# Patient Record
Sex: Female | Born: 1935 | Race: White | Hispanic: No | Smoking: Never smoker
Health system: Southern US, Community
[De-identification: ages and names within clinical notes are randomized; demographics above are authoritative.]

## PROBLEM LIST (undated history)

## (undated) DIAGNOSIS — Z7901 Long term (current) use of anticoagulants: Secondary | ICD-10-CM

## (undated) DIAGNOSIS — G250 Essential tremor: Secondary | ICD-10-CM

## (undated) DIAGNOSIS — I7300 Raynaud's syndrome without gangrene: Secondary | ICD-10-CM

## (undated) DIAGNOSIS — I509 Heart failure, unspecified: Secondary | ICD-10-CM

## (undated) DIAGNOSIS — I10 Essential (primary) hypertension: Secondary | ICD-10-CM

## (undated) DIAGNOSIS — I4891 Unspecified atrial fibrillation: Secondary | ICD-10-CM

## (undated) HISTORY — DX: Long term (current) use of anticoagulants: Z79.01

## (undated) HISTORY — DX: Raynaud's syndrome without gangrene: I73.00

## (undated) HISTORY — PX: CHOLECYSTECTOMY: SHX55

## (undated) HISTORY — DX: Unspecified atrial fibrillation: I48.91

## (undated) HISTORY — PX: TONSILLECTOMY: SUR1361

## (undated) HISTORY — DX: Essential (primary) hypertension: I10

## (undated) HISTORY — DX: Essential tremor: G25.0

---

## 1997-12-23 ENCOUNTER — Other Ambulatory Visit: Admission: RE | Admit: 1997-12-23 | Discharge: 1997-12-23 | Payer: Self-pay | Admitting: Obstetrics and Gynecology

## 1998-01-11 ENCOUNTER — Other Ambulatory Visit: Admission: RE | Admit: 1998-01-11 | Discharge: 1998-01-11 | Payer: Self-pay | Admitting: *Deleted

## 1998-01-18 ENCOUNTER — Other Ambulatory Visit: Admission: RE | Admit: 1998-01-18 | Discharge: 1998-01-18 | Payer: Self-pay | Admitting: *Deleted

## 1998-02-12 ENCOUNTER — Other Ambulatory Visit: Admission: RE | Admit: 1998-02-12 | Discharge: 1998-02-12 | Payer: Self-pay | Admitting: *Deleted

## 1998-09-10 ENCOUNTER — Emergency Department (HOSPITAL_COMMUNITY): Admission: EM | Admit: 1998-09-10 | Discharge: 1998-09-11 | Payer: Self-pay | Admitting: Emergency Medicine

## 1998-09-10 ENCOUNTER — Encounter: Payer: Self-pay | Admitting: Emergency Medicine

## 1998-12-27 ENCOUNTER — Other Ambulatory Visit: Admission: RE | Admit: 1998-12-27 | Discharge: 1998-12-27 | Payer: Self-pay | Admitting: Obstetrics and Gynecology

## 1999-05-31 ENCOUNTER — Ambulatory Visit (HOSPITAL_COMMUNITY): Admission: RE | Admit: 1999-05-31 | Discharge: 1999-05-31 | Payer: Self-pay | Admitting: Gastroenterology

## 1999-06-20 ENCOUNTER — Encounter: Payer: Self-pay | Admitting: *Deleted

## 1999-06-20 ENCOUNTER — Encounter: Admission: RE | Admit: 1999-06-20 | Discharge: 1999-06-20 | Payer: Self-pay | Admitting: *Deleted

## 1999-07-04 ENCOUNTER — Encounter: Payer: Self-pay | Admitting: *Deleted

## 1999-07-04 ENCOUNTER — Encounter: Admission: RE | Admit: 1999-07-04 | Discharge: 1999-07-04 | Payer: Self-pay | Admitting: *Deleted

## 2000-01-06 ENCOUNTER — Other Ambulatory Visit: Admission: RE | Admit: 2000-01-06 | Discharge: 2000-01-06 | Payer: Self-pay | Admitting: Obstetrics and Gynecology

## 2000-01-09 ENCOUNTER — Ambulatory Visit (HOSPITAL_COMMUNITY): Admission: RE | Admit: 2000-01-09 | Discharge: 2000-01-09 | Payer: Self-pay | Admitting: Cardiovascular Disease

## 2000-01-19 ENCOUNTER — Inpatient Hospital Stay (HOSPITAL_COMMUNITY): Admission: EM | Admit: 2000-01-19 | Discharge: 2000-01-22 | Payer: Self-pay | Admitting: Cardiovascular Disease

## 2000-02-24 ENCOUNTER — Encounter: Payer: Self-pay | Admitting: *Deleted

## 2000-02-24 ENCOUNTER — Encounter: Admission: RE | Admit: 2000-02-24 | Discharge: 2000-02-24 | Payer: Self-pay | Admitting: *Deleted

## 2000-03-05 ENCOUNTER — Encounter: Payer: Self-pay | Admitting: *Deleted

## 2000-03-05 ENCOUNTER — Encounter: Admission: RE | Admit: 2000-03-05 | Discharge: 2000-03-05 | Payer: Self-pay | Admitting: *Deleted

## 2000-03-07 ENCOUNTER — Encounter: Admission: RE | Admit: 2000-03-07 | Discharge: 2000-03-07 | Payer: Self-pay | Admitting: *Deleted

## 2000-03-07 ENCOUNTER — Encounter: Payer: Self-pay | Admitting: *Deleted

## 2000-06-26 ENCOUNTER — Encounter (HOSPITAL_BASED_OUTPATIENT_CLINIC_OR_DEPARTMENT_OTHER): Payer: Self-pay | Admitting: Internal Medicine

## 2000-06-26 ENCOUNTER — Encounter: Admission: RE | Admit: 2000-06-26 | Discharge: 2000-06-26 | Payer: Self-pay | Admitting: Internal Medicine

## 2000-09-20 ENCOUNTER — Encounter (INDEPENDENT_AMBULATORY_CARE_PROVIDER_SITE_OTHER): Payer: Self-pay | Admitting: Specialist

## 2000-09-20 ENCOUNTER — Other Ambulatory Visit: Admission: RE | Admit: 2000-09-20 | Discharge: 2000-09-20 | Payer: Self-pay | Admitting: Ophthalmology

## 2001-02-14 ENCOUNTER — Encounter: Payer: Self-pay | Admitting: Orthopedic Surgery

## 2001-02-14 ENCOUNTER — Encounter: Admission: RE | Admit: 2001-02-14 | Discharge: 2001-02-14 | Payer: Self-pay | Admitting: Orthopedic Surgery

## 2001-06-27 ENCOUNTER — Encounter: Admission: RE | Admit: 2001-06-27 | Discharge: 2001-06-27 | Payer: Self-pay | Admitting: Obstetrics and Gynecology

## 2001-06-27 ENCOUNTER — Encounter: Payer: Self-pay | Admitting: Obstetrics and Gynecology

## 2002-02-14 ENCOUNTER — Other Ambulatory Visit: Admission: RE | Admit: 2002-02-14 | Discharge: 2002-02-14 | Payer: Self-pay | Admitting: Obstetrics and Gynecology

## 2002-07-25 ENCOUNTER — Encounter: Admission: RE | Admit: 2002-07-25 | Discharge: 2002-07-25 | Payer: Self-pay | Admitting: Obstetrics and Gynecology

## 2002-07-25 ENCOUNTER — Encounter: Payer: Self-pay | Admitting: Obstetrics and Gynecology

## 2002-09-10 ENCOUNTER — Encounter: Payer: Self-pay | Admitting: Obstetrics and Gynecology

## 2002-09-10 ENCOUNTER — Encounter: Admission: RE | Admit: 2002-09-10 | Discharge: 2002-09-10 | Payer: Self-pay | Admitting: Obstetrics and Gynecology

## 2002-12-12 ENCOUNTER — Encounter: Payer: Self-pay | Admitting: Cardiovascular Disease

## 2003-09-16 ENCOUNTER — Encounter: Admission: RE | Admit: 2003-09-16 | Discharge: 2003-09-16 | Payer: Self-pay | Admitting: Internal Medicine

## 2003-12-25 ENCOUNTER — Encounter: Admission: RE | Admit: 2003-12-25 | Discharge: 2003-12-25 | Payer: Self-pay | Admitting: Gastroenterology

## 2004-05-27 ENCOUNTER — Other Ambulatory Visit: Admission: RE | Admit: 2004-05-27 | Discharge: 2004-05-27 | Payer: Self-pay | Admitting: Obstetrics and Gynecology

## 2004-10-10 ENCOUNTER — Encounter: Admission: RE | Admit: 2004-10-10 | Discharge: 2004-10-10 | Payer: Self-pay | Admitting: Obstetrics and Gynecology

## 2005-06-21 ENCOUNTER — Ambulatory Visit (HOSPITAL_COMMUNITY): Admission: RE | Admit: 2005-06-21 | Discharge: 2005-06-21 | Payer: Self-pay | Admitting: Gastroenterology

## 2005-10-18 ENCOUNTER — Encounter: Admission: RE | Admit: 2005-10-18 | Discharge: 2005-10-18 | Payer: Self-pay | Admitting: Internal Medicine

## 2005-12-06 ENCOUNTER — Ambulatory Visit (HOSPITAL_COMMUNITY): Admission: RE | Admit: 2005-12-06 | Discharge: 2005-12-06 | Payer: Self-pay | Admitting: Internal Medicine

## 2006-07-19 ENCOUNTER — Encounter: Admission: RE | Admit: 2006-07-19 | Discharge: 2006-07-19 | Payer: Self-pay | Admitting: Internal Medicine

## 2006-11-27 ENCOUNTER — Encounter: Admission: RE | Admit: 2006-11-27 | Discharge: 2006-11-27 | Payer: Self-pay | Admitting: Internal Medicine

## 2007-11-28 ENCOUNTER — Encounter: Admission: RE | Admit: 2007-11-28 | Discharge: 2007-11-28 | Payer: Self-pay | Admitting: Internal Medicine

## 2008-01-01 ENCOUNTER — Ambulatory Visit (HOSPITAL_COMMUNITY): Admission: RE | Admit: 2008-01-01 | Discharge: 2008-01-01 | Payer: Self-pay | Admitting: Internal Medicine

## 2008-11-30 ENCOUNTER — Encounter: Admission: RE | Admit: 2008-11-30 | Discharge: 2008-11-30 | Payer: Self-pay | Admitting: Internal Medicine

## 2009-07-15 ENCOUNTER — Ambulatory Visit (HOSPITAL_COMMUNITY): Admission: RE | Admit: 2009-07-15 | Discharge: 2009-07-15 | Payer: Self-pay | Admitting: Internal Medicine

## 2009-12-20 ENCOUNTER — Encounter: Admission: RE | Admit: 2009-12-20 | Discharge: 2009-12-20 | Payer: Self-pay | Admitting: Obstetrics and Gynecology

## 2010-03-23 ENCOUNTER — Ambulatory Visit: Payer: Self-pay | Admitting: Cardiovascular Disease

## 2010-04-25 ENCOUNTER — Ambulatory Visit: Payer: Self-pay | Admitting: Cardiovascular Disease

## 2010-04-29 ENCOUNTER — Ambulatory Visit: Payer: Self-pay | Admitting: Cardiology

## 2010-05-06 ENCOUNTER — Ambulatory Visit: Payer: Self-pay | Admitting: Cardiovascular Disease

## 2010-06-08 ENCOUNTER — Ambulatory Visit: Payer: Self-pay | Admitting: Cardiovascular Disease

## 2010-07-05 ENCOUNTER — Ambulatory Visit: Payer: Self-pay | Admitting: Cardiology

## 2010-07-18 ENCOUNTER — Ambulatory Visit (HOSPITAL_COMMUNITY)
Admission: RE | Admit: 2010-07-18 | Discharge: 2010-07-18 | Payer: Self-pay | Source: Home / Self Care | Attending: Internal Medicine | Admitting: Internal Medicine

## 2010-07-21 ENCOUNTER — Ambulatory Visit: Payer: Self-pay | Admitting: Cardiology

## 2010-08-09 ENCOUNTER — Ambulatory Visit: Payer: Self-pay | Admitting: Cardiology

## 2010-08-24 ENCOUNTER — Other Ambulatory Visit: Payer: Self-pay | Admitting: Gastroenterology

## 2010-09-07 ENCOUNTER — Encounter (INDEPENDENT_AMBULATORY_CARE_PROVIDER_SITE_OTHER): Payer: Medicare Other

## 2010-09-07 DIAGNOSIS — Z7901 Long term (current) use of anticoagulants: Secondary | ICD-10-CM

## 2010-09-26 ENCOUNTER — Ambulatory Visit: Payer: Self-pay | Admitting: Cardiovascular Disease

## 2010-10-05 ENCOUNTER — Other Ambulatory Visit (INDEPENDENT_AMBULATORY_CARE_PROVIDER_SITE_OTHER): Payer: Medicare Other

## 2010-10-05 DIAGNOSIS — I4891 Unspecified atrial fibrillation: Secondary | ICD-10-CM

## 2010-10-05 DIAGNOSIS — Z7901 Long term (current) use of anticoagulants: Secondary | ICD-10-CM

## 2010-11-07 ENCOUNTER — Encounter: Payer: Self-pay | Admitting: Cardiovascular Disease

## 2010-11-07 ENCOUNTER — Ambulatory Visit (INDEPENDENT_AMBULATORY_CARE_PROVIDER_SITE_OTHER): Payer: Medicare Other | Admitting: Cardiovascular Disease

## 2010-11-07 VITALS — BP 138/82 | HR 56 | Ht 66.0 in | Wt 116.6 lb

## 2010-11-07 DIAGNOSIS — G250 Essential tremor: Secondary | ICD-10-CM | POA: Insufficient documentation

## 2010-11-07 DIAGNOSIS — I482 Chronic atrial fibrillation, unspecified: Secondary | ICD-10-CM | POA: Insufficient documentation

## 2010-11-07 DIAGNOSIS — Z7901 Long term (current) use of anticoagulants: Secondary | ICD-10-CM | POA: Insufficient documentation

## 2010-11-07 DIAGNOSIS — I10 Essential (primary) hypertension: Secondary | ICD-10-CM | POA: Insufficient documentation

## 2010-11-07 DIAGNOSIS — I4891 Unspecified atrial fibrillation: Secondary | ICD-10-CM

## 2010-11-07 DIAGNOSIS — I7300 Raynaud's syndrome without gangrene: Secondary | ICD-10-CM | POA: Insufficient documentation

## 2010-11-07 NOTE — Progress Notes (Signed)
History of Present Illness:  Angelica Mejia is an elderly female with a history of atrial fibrillation, and essential tremor, and hypertension she also has Raynaud's phenomenon. She's not having any cardiac problems. She complains that her tremor is gradually getting worse. We've had her on Inderal which seems to help her tremor some.  Current Outpatient Prescriptions  Medication Sig Dispense Refill  . Calcium Carbonate-Vitamin D (CALTRATE 600+D PO) Take by mouth 2 (two) times daily.        . digoxin (LANOXIN) 0.125 MG tablet Take 125 mcg by mouth daily.        Marland Kitchen lisinopril (PRINIVIL,ZESTRIL) 10 MG tablet Take 10 mg by mouth daily.        . Multiple Vitamin (MULTIVITAMIN) tablet Take 1 tablet by mouth daily.        . propranolol (INDERAL) 80 MG tablet Take 80 mg by mouth daily.        Marland Kitchen triamterene-hydrochlorothiazide (MAXZIDE-25) 37.5-25 MG per tablet Take 1 tablet by mouth daily.        Marland Kitchen warfarin (COUMADIN) 5 MG tablet Take 5 mg by mouth daily. As directed         No Known Allergies  Past Medical History  Diagnosis Date  . Atrial fibrillation   . Tremor, essential   . Chronic anticoagulation   . Hypertension   . Raynaud phenomenon     History reviewed. No pertinent past surgical history.  History  Smoking status  . Never Smoker   Smokeless tobacco  . Not on file    History  Alcohol Use No    History reviewed. No pertinent family history.  Reviw of Systems:  The patient denies any heat or cold intolerance.  No weight gain or weight loss.  The patient denies headaches or blurry vision.  There is no cough or sputum production.  The patient denies dizziness.  There is no hematuria or hematochezia.  The patient denies any muscle aches or arthritis.  The patient denies any rash.  The patient denies frequent falling or instability.  There is no history of depression or anxiety.  All other systems were reviewed and are negative.  Physical Exam: BP 138/82  Pulse 56  Ht 5\' 6"   (1.676 m)  Wt 116 lb 9.6 oz (52.889 kg)  BMI 18.82 kg/m2 The patient is alert and oriented x 3.  The mood and affect are normal.  The skin is warm and dry.  Color is normal.  The HEENT exam reveals that the sclera are nonicteric.  The mucous membranes are moist.  The carotids are 2+ without bruits.  There is no thyromegaly.  There is no JVD.  The lungs are clear.  The chest wall is non tender.  The heart exam reveals an irregular rate with a normal S1 and S2.  There are no murmurs, gallops, or rubs.  The PMI is not displaced.   Abdominal exam reveals good bowel sounds.  There is no guarding or rebound.  There is no hepatosplenomegaly or tenderness.  There are no masses.  Exam of the legs reveal no clubbing, cyanosis, or edema.  The legs are without rashes.  The distal pulses are intact.  Cranial nerves II - XII are intact.  Motor and sensory functions are intact.  The gait is normal.  Assessment / Plan:

## 2010-11-07 NOTE — Assessment & Plan Note (Signed)
Angelica Mejia heart rate is well-controlled. She seems to be tolerating that H. Fibrillation fairly well. She is switched back from Pradaxa And is now on Coumadin. She seems to be final Coumadin.

## 2010-11-07 NOTE — Assessment & Plan Note (Signed)
Her INR levels have been therapeutic. We'll check INR today.

## 2010-11-08 ENCOUNTER — Telehealth: Payer: Self-pay | Admitting: Cardiovascular Disease

## 2010-11-08 NOTE — Telephone Encounter (Signed)
PT SAID HAD INR YESTERDAY AND WAS TOLD IT WAS TOO THICK BUT NO ONE HAS CALLED HER BACK. PLACED CHART IN BOX.

## 2010-11-09 ENCOUNTER — Telehealth: Payer: Self-pay | Admitting: *Deleted

## 2010-11-09 NOTE — Telephone Encounter (Signed)
Reported coumadin results to pt.

## 2010-11-25 ENCOUNTER — Ambulatory Visit (INDEPENDENT_AMBULATORY_CARE_PROVIDER_SITE_OTHER): Payer: Medicare Other | Admitting: *Deleted

## 2010-11-25 DIAGNOSIS — Z7901 Long term (current) use of anticoagulants: Secondary | ICD-10-CM

## 2010-11-25 DIAGNOSIS — I4891 Unspecified atrial fibrillation: Secondary | ICD-10-CM

## 2010-11-25 LAB — POCT INR: INR: 2.8

## 2010-12-02 ENCOUNTER — Other Ambulatory Visit (HOSPITAL_BASED_OUTPATIENT_CLINIC_OR_DEPARTMENT_OTHER): Payer: Self-pay | Admitting: Internal Medicine

## 2010-12-02 DIAGNOSIS — Z1231 Encounter for screening mammogram for malignant neoplasm of breast: Secondary | ICD-10-CM

## 2010-12-08 ENCOUNTER — Ambulatory Visit (INDEPENDENT_AMBULATORY_CARE_PROVIDER_SITE_OTHER): Payer: Medicare Other | Admitting: *Deleted

## 2010-12-08 DIAGNOSIS — I4891 Unspecified atrial fibrillation: Secondary | ICD-10-CM

## 2010-12-16 ENCOUNTER — Encounter: Payer: Self-pay | Admitting: Cardiovascular Disease

## 2010-12-19 ENCOUNTER — Ambulatory Visit (INDEPENDENT_AMBULATORY_CARE_PROVIDER_SITE_OTHER): Payer: Medicare Other | Admitting: *Deleted

## 2010-12-19 DIAGNOSIS — I4891 Unspecified atrial fibrillation: Secondary | ICD-10-CM

## 2010-12-23 NOTE — H&P (Signed)
[place]. [facility]  Patient:    Angelica Mejia, Angelica Mejia                        MRN: [id number] Adm. Date:  01/19/00 Attending:  Alvia Grove., M.D. CC:         Georg Ruddle. Viviann Spare, M.D.                         History and Physical  HISTORY:  Angelica Mejia is a 77 year old female with a history of atrial fibrillation.  She was recently cardioverted but unfortunately went back into atrial fibrillation.  She returns for consideration for admission to the hospital.  The patient has had some fatigue and malaise since being back in atrial fibrillation.  She has not had any episodes of chest pain.  She has not had any profound episodes of congestive heart failure.  CURRENT MEDICATIONS: 1. Multivitamin once a day. 2. Lanoxin 0.25 mg a day. 3. Toprol XL 100 mg a day. 4. Maxzide 37.5 mg/25 mg once a day. 5. Coumadin 5 mg alternating with 2.5 mg a day. 6. Allegra 60 mg twice a day.  ALLERGIES:  No known drug allergies.  PAST MEDICAL HISTORY: 1. Atrial fibrillation. 2. History of SVT. 3. Hypertension.  SOCIAL HISTORY:  The patient does not smoke and does not drink.  FAMILY HISTORY:  Noncontributory.  REVIEW OF SYSTEMS:  Extensively reviewed and is essentially negative.  PHYSICAL EXAMINATION:  GENERAL:  She is an elderly white female who appears to be younger than her stated age.  VITAL SIGNS:  Blood pressure 140.70, heart rate 60.  NECK:  Exam reveals 2+ carotid with no bruits.  There is no JVD and no thyromegaly.   There is no lymphadenopathy.  LUNGS:  Clear to auscultation.  BACK:  Nontender.  HEART:  Irregularly irregular.  She has no significant murmurs, gallops, or rubs.  Her PMI is nondisplaced.  ABDOMEN:  Good bowel sounds and is nontender.  There is no hepatosplenomegaly and no masses or bruits.  EXTREMITIES:  She has no clubbing, cyanosis, or edema.  Her pulses are 2+ and symmetric bilaterally.  There is no calf  tenderness.  NEUROLOGIC:  Cranial nerves II-XII intact.   Motor and sensory function are intact.   Her gait is normal.  LABORATORY DATA:  EKG reveals atrial fibrillation with a well controlled ventricular response.  She has nonspecific ST and T wave abnormalities, probably due to digoxin.  ASSESSMENT:  Angelica Mejia is now admitted with persistent atrial fibrillation. We will start her on amiodarone and will anticipate loading her for four or five days followed by cardioversion.  We will have to decrease her Coumadin slightly, and we will probably decrease her Toprol and digoxin as her heart rate slows because of the amiodarone.  We will continue her Maxzide as needed for her hypertension. DD:  01/23/00 TD:  01/23/00 Job: [id number]

## 2010-12-23 NOTE — Discharge Summary (Signed)
[place]. [facility]  Patient:    Angelica Mejia, Angelica Mejia                      MRN: [id number] Adm. Date:  [id number] Disc. Date: [id number] Attending:  Koren Bound CC:         Georg Ruddle. Viviann Spare, M.D.                           Discharge Summary  ADMISSION DIAGNOSIS:  Atrial fibrillation.  DIAGNOSIS DIAGNOSIS:  Atrial fibrillation with spontaneous cardioversion following amiodarone therapy.  DISCHARGE MEDICATIONS: 1. Amiodarone 200 mg twice a day. 2. Coumadin 5 mg a day twice a week with 2.5 mg five times a week. 3. Maxzide 37.5 mg/25 mg once a day.  DISPOSITION:  The patient has been instructed to walk everyday.  She is to eat a low fat, low salt, low cholesterol diet.  She is to see Dr. Elease Hashimoto in one to two weeks. She will come to the office and get a protime on Wednesday.  HISTORY OF PRESENT ILLNESS:  Angelica Mejia is a 77 year old female with a history of atrial fibrillation. She had recently had a cardioversion but unfortunately went back into atrial fibrillation.  She was admitted to the hospital on January 19, 2000, for initiation of amiodarone therapy.  Please see dictated H&P for further details.  HOSPITAL COURSE BY PROBLEMS: 1. ATRIAL FIBRILLATION.  The patient was started on amiodarone loading.  She received 400 mg three times a day.  On January 21, 2000, around two oclock she converted to sinus rhythm.  She maintained sinus rhythm for the next 24 hours and is now discharged in satisfactory condition.  She has had a slight stomach upset to the amiodarone but otherwise is feeling quite well.  Her protime has remained therapy.  She will be sent home on the above noted medications and disposition.  2. HYPERTENSION.  Stable.  We will follow up as an outpatient. DD:  01/22/00 TD:  01/24/00 Job: [id number]

## 2010-12-23 NOTE — Op Note (Signed)
Angelica Mejia, Angelica Mejia               ACCOUNT NO.:  [id number]   MEDICAL RECORD NO.:  [id number]          PATIENT TYPE:  AMB   LOCATION:  ENDO                         FACILITY:  [facility]   PHYSICIAN:  James L. Malon Kindle., M.D.DATE OF BIRTH:  29-Jan-1919   DATE OF PROCEDURE:  06/21/2005  DATE OF DISCHARGE:                                 OPERATIVE REPORT   PROCEDURE PERFORMED:  Colonoscopy.   ENDOSCOPIST:  Llana Aliment. Edwards, M.D.   MEDICATIONS:  Fentanyl 80 mcg, Versed 8 mg IV.   INSTRUMENT USED:  Olympus pediatric adjustable colonoscope.   INDICATIONS FOR PROCEDURE:  Patient with previous history of multiple  adenomas and polyps.  This is done to ___________ polyps.   DESCRIPTION OF PROCEDURE:  The procedure had been explained to the patient  and consent obtained.  Left lateral decubitus position, the pediatric  adjustable scope was inserted and advanced under direct visualization.  Cecum reached, prep excellent.  The scope was withdrawn and the colon  carefully examined upon withdrawal.  No polyps seen throughout the entire  colon.  Minimal diverticular disease.  The scope was withdrawn to the  rectum.  Retroflex view patient was seen to have internal hemorrhoids, but  no polyps.  Scope withdrawn.  The patient tolerated the procedure well.   ASSESSMENT:  History of colon polyps, V12.72.   PLAN:  Will recommend yearly Hemoccults.  Repeat colonoscopy in five years.           ______________________________  Llana Aliment Malon Kindle., M.D.     Waldron Session  D:  06/21/2005  T:  06/21/2005  Job:  [id number]   cc:   Barry Dienes. Eloise Harman, M.D.  Fax: [phone]

## 2010-12-26 ENCOUNTER — Ambulatory Visit
Admission: RE | Admit: 2010-12-26 | Discharge: 2010-12-26 | Disposition: A | Discharge status: Home / Self Care | Payer: Medicare Other | Source: Ambulatory Visit | Attending: Internal Medicine | Admitting: Internal Medicine

## 2010-12-26 DIAGNOSIS — Z1231 Encounter for screening mammogram for malignant neoplasm of breast: Secondary | ICD-10-CM

## 2011-01-17 ENCOUNTER — Ambulatory Visit: Payer: Self-pay | Admitting: *Deleted

## 2011-01-19 ENCOUNTER — Encounter: Payer: Self-pay | Admitting: Cardiovascular Disease

## 2011-02-14 LAB — PROTIME-INR: INR: 2.5 — AB (ref <=1.1)

## 2011-02-15 ENCOUNTER — Ambulatory Visit: Payer: Self-pay | Admitting: *Deleted

## 2011-03-15 ENCOUNTER — Ambulatory Visit (INDEPENDENT_AMBULATORY_CARE_PROVIDER_SITE_OTHER): Payer: Self-pay | Admitting: Internal Medicine

## 2011-03-15 DIAGNOSIS — R0989 Other specified symptoms and signs involving the circulatory and respiratory systems: Secondary | ICD-10-CM

## 2011-03-15 LAB — POCT INR: INR: 2.5

## 2011-03-22 ENCOUNTER — Other Ambulatory Visit: Payer: Self-pay | Admitting: *Deleted

## 2011-03-22 MED ORDER — DIGOXIN 125 MCG PO TABS: 125.0000 ug | ORAL_TABLET | Freq: Every day | ORAL | Status: DC

## 2011-03-22 NOTE — Telephone Encounter (Signed)
Fax received from pharmacy. Refill completed. Jodette Sharad Vaneaton RN  

## 2011-04-11 ENCOUNTER — Telehealth: Payer: Self-pay | Admitting: Cardiovascular Disease

## 2011-04-11 ENCOUNTER — Ambulatory Visit (INDEPENDENT_AMBULATORY_CARE_PROVIDER_SITE_OTHER): Payer: Self-pay | Admitting: Cardiology

## 2011-04-11 DIAGNOSIS — R0989 Other specified symptoms and signs involving the circulatory and respiratory systems: Secondary | ICD-10-CM

## 2011-04-11 NOTE — Telephone Encounter (Signed)
Called her back, yes i forwarded results

## 2011-04-11 NOTE — Telephone Encounter (Signed)
Santana from [facility] called and wanted to know if we sent patient's INR results to [facility].  RN can call her back and let her know.

## 2011-05-09 ENCOUNTER — Ambulatory Visit (INDEPENDENT_AMBULATORY_CARE_PROVIDER_SITE_OTHER): Payer: Self-pay | Admitting: Internal Medicine

## 2011-05-09 DIAGNOSIS — R0989 Other specified symptoms and signs involving the circulatory and respiratory systems: Secondary | ICD-10-CM

## 2011-05-09 LAB — POCT INR: INR: 2

## 2011-06-06 ENCOUNTER — Ambulatory Visit (INDEPENDENT_AMBULATORY_CARE_PROVIDER_SITE_OTHER): Payer: Self-pay | Admitting: Cardiology

## 2011-06-06 DIAGNOSIS — Z7901 Long term (current) use of anticoagulants: Secondary | ICD-10-CM

## 2011-06-06 DIAGNOSIS — I4891 Unspecified atrial fibrillation: Secondary | ICD-10-CM

## 2011-06-06 DIAGNOSIS — R0989 Other specified symptoms and signs involving the circulatory and respiratory systems: Secondary | ICD-10-CM

## 2011-06-06 LAB — POCT INR: INR: 2.1

## 2011-07-04 ENCOUNTER — Ambulatory Visit (INDEPENDENT_AMBULATORY_CARE_PROVIDER_SITE_OTHER): Payer: Self-pay | Admitting: Cardiology

## 2011-07-04 DIAGNOSIS — R0989 Other specified symptoms and signs involving the circulatory and respiratory systems: Secondary | ICD-10-CM

## 2011-07-04 DIAGNOSIS — I4891 Unspecified atrial fibrillation: Secondary | ICD-10-CM

## 2011-07-04 DIAGNOSIS — Z7901 Long term (current) use of anticoagulants: Secondary | ICD-10-CM

## 2011-08-15 ENCOUNTER — Ambulatory Visit (INDEPENDENT_AMBULATORY_CARE_PROVIDER_SITE_OTHER): Payer: Self-pay | Admitting: Cardiology

## 2011-08-15 DIAGNOSIS — R0989 Other specified symptoms and signs involving the circulatory and respiratory systems: Secondary | ICD-10-CM

## 2011-08-15 DIAGNOSIS — Z7901 Long term (current) use of anticoagulants: Secondary | ICD-10-CM

## 2011-08-15 DIAGNOSIS — I4891 Unspecified atrial fibrillation: Secondary | ICD-10-CM

## 2011-08-21 DIAGNOSIS — H10509 Unspecified blepharoconjunctivitis, unspecified eye: Secondary | ICD-10-CM | POA: Diagnosis not present

## 2011-09-05 ENCOUNTER — Telehealth: Payer: Self-pay | Admitting: *Deleted

## 2011-09-05 ENCOUNTER — Telehealth: Payer: Self-pay | Admitting: Cardiovascular Disease

## 2011-09-05 MED ORDER — DIGOXIN 125 MCG PO TABS: 125.0000 ug | ORAL_TABLET | Freq: Every day | ORAL | Status: DC

## 2011-09-05 NOTE — Telephone Encounter (Signed)
New Problem   Patient request call regarding medication she received.  Patient can be reached at hm# after 12 noon

## 2011-09-05 NOTE — Telephone Encounter (Signed)
Pt read back to me script from [facility] and her digoxin was filled at 0.250 mg/ double the strength of prescription, she will call them to inform and i sent in refill locally to get her through.

## 2011-09-13 ENCOUNTER — Other Ambulatory Visit: Payer: Self-pay | Admitting: *Deleted

## 2011-09-13 ENCOUNTER — Telehealth: Payer: Self-pay | Admitting: Cardiovascular Disease

## 2011-09-13 MED ORDER — DIGOXIN 125 MCG PO TABS: 125.0000 ug | ORAL_TABLET | Freq: Every day | ORAL | Status: DC

## 2011-09-13 NOTE — Telephone Encounter (Signed)
New problem Pt said she received digoxin from [facility]  25 mg and she said this is wrong, she said she takes .0125 mg. She said she has 6 pills left of the correct pills She wants to talk to someone to straighten this out. Please call her back

## 2011-09-13 NOTE — Telephone Encounter (Signed)
Medication dose checked and called [facility] to have them correct the dose in their chart.  New rx sent in to [facility] per pt request.

## 2011-09-26 ENCOUNTER — Ambulatory Visit (INDEPENDENT_AMBULATORY_CARE_PROVIDER_SITE_OTHER): Payer: Self-pay | Admitting: Cardiology

## 2011-09-26 DIAGNOSIS — R0989 Other specified symptoms and signs involving the circulatory and respiratory systems: Secondary | ICD-10-CM

## 2011-09-26 DIAGNOSIS — Z7901 Long term (current) use of anticoagulants: Secondary | ICD-10-CM

## 2011-09-26 DIAGNOSIS — I4891 Unspecified atrial fibrillation: Secondary | ICD-10-CM

## 2011-09-26 LAB — POCT INR: INR: 2.3

## 2011-10-18 DIAGNOSIS — H04129 Dry eye syndrome of unspecified lacrimal gland: Secondary | ICD-10-CM | POA: Diagnosis not present

## 2011-10-18 DIAGNOSIS — Z947 Corneal transplant status: Secondary | ICD-10-CM | POA: Diagnosis not present

## 2011-10-18 DIAGNOSIS — H1030 Unspecified acute conjunctivitis, unspecified eye: Secondary | ICD-10-CM | POA: Diagnosis not present

## 2011-10-23 DIAGNOSIS — Z124 Encounter for screening for malignant neoplasm of cervix: Secondary | ICD-10-CM | POA: Diagnosis not present

## 2011-11-07 ENCOUNTER — Ambulatory Visit (INDEPENDENT_AMBULATORY_CARE_PROVIDER_SITE_OTHER): Payer: Medicare Other | Admitting: Cardiovascular Disease

## 2011-11-07 DIAGNOSIS — Z7901 Long term (current) use of anticoagulants: Secondary | ICD-10-CM

## 2011-11-07 DIAGNOSIS — I4891 Unspecified atrial fibrillation: Secondary | ICD-10-CM

## 2011-11-07 LAB — POCT INR: INR: 2.9

## 2011-12-20 ENCOUNTER — Ambulatory Visit: Payer: Self-pay | Admitting: Internal Medicine

## 2011-12-20 DIAGNOSIS — I4891 Unspecified atrial fibrillation: Secondary | ICD-10-CM

## 2011-12-20 DIAGNOSIS — Z7901 Long term (current) use of anticoagulants: Secondary | ICD-10-CM

## 2011-12-29 ENCOUNTER — Encounter: Payer: Self-pay | Admitting: *Deleted

## 2012-01-01 DIAGNOSIS — H16209 Unspecified keratoconjunctivitis, unspecified eye: Secondary | ICD-10-CM | POA: Diagnosis not present

## 2012-01-01 DIAGNOSIS — H2000 Unspecified acute and subacute iridocyclitis: Secondary | ICD-10-CM | POA: Diagnosis not present

## 2012-01-02 DIAGNOSIS — H2000 Unspecified acute and subacute iridocyclitis: Secondary | ICD-10-CM | POA: Diagnosis not present

## 2012-01-02 DIAGNOSIS — H16209 Unspecified keratoconjunctivitis, unspecified eye: Secondary | ICD-10-CM | POA: Diagnosis not present

## 2012-01-05 DIAGNOSIS — H16049 Marginal corneal ulcer, unspecified eye: Secondary | ICD-10-CM | POA: Diagnosis not present

## 2012-01-05 DIAGNOSIS — H16109 Unspecified superficial keratitis, unspecified eye: Secondary | ICD-10-CM | POA: Diagnosis not present

## 2012-01-05 DIAGNOSIS — Z947 Corneal transplant status: Secondary | ICD-10-CM | POA: Diagnosis not present

## 2012-01-08 DIAGNOSIS — H16109 Unspecified superficial keratitis, unspecified eye: Secondary | ICD-10-CM | POA: Diagnosis not present

## 2012-01-08 DIAGNOSIS — H16049 Marginal corneal ulcer, unspecified eye: Secondary | ICD-10-CM | POA: Diagnosis not present

## 2012-01-08 DIAGNOSIS — Z947 Corneal transplant status: Secondary | ICD-10-CM | POA: Diagnosis not present

## 2012-01-09 ENCOUNTER — Ambulatory Visit: Payer: Self-pay | Admitting: Cardiovascular Disease

## 2012-01-09 DIAGNOSIS — Z7901 Long term (current) use of anticoagulants: Secondary | ICD-10-CM

## 2012-01-09 DIAGNOSIS — I4891 Unspecified atrial fibrillation: Secondary | ICD-10-CM

## 2012-01-16 ENCOUNTER — Other Ambulatory Visit: Payer: Self-pay | Admitting: Internal Medicine

## 2012-01-16 DIAGNOSIS — Z1231 Encounter for screening mammogram for malignant neoplasm of breast: Secondary | ICD-10-CM

## 2012-01-17 DIAGNOSIS — Z947 Corneal transplant status: Secondary | ICD-10-CM | POA: Diagnosis not present

## 2012-01-17 DIAGNOSIS — H04129 Dry eye syndrome of unspecified lacrimal gland: Secondary | ICD-10-CM | POA: Diagnosis not present

## 2012-01-17 DIAGNOSIS — H16049 Marginal corneal ulcer, unspecified eye: Secondary | ICD-10-CM | POA: Diagnosis not present

## 2012-01-29 ENCOUNTER — Ambulatory Visit
Admission: RE | Admit: 2012-01-29 | Discharge: 2012-01-29 | Disposition: A | Discharge status: Home / Self Care | Payer: Medicare Other | Source: Ambulatory Visit | Attending: Internal Medicine | Admitting: Internal Medicine

## 2012-01-29 DIAGNOSIS — Z1231 Encounter for screening mammogram for malignant neoplasm of breast: Secondary | ICD-10-CM | POA: Diagnosis not present

## 2012-02-06 ENCOUNTER — Ambulatory Visit: Payer: Self-pay | Admitting: Cardiology

## 2012-02-06 DIAGNOSIS — I4891 Unspecified atrial fibrillation: Secondary | ICD-10-CM

## 2012-02-06 DIAGNOSIS — Z7901 Long term (current) use of anticoagulants: Secondary | ICD-10-CM

## 2012-02-06 LAB — POCT INR: INR: 1.2

## 2012-02-06 NOTE — Patient Instructions (Addendum)
Per Carollee Herter for Dr Felipa Eth, this pt with a GI bleed,  do not take coumadin until you see Dr Eloise Harman, he will be back in office tomorrow, she states patient has not been seen by him since October of 2012,  I did notify patient via phone to hold coumadin and to expect a call from Dr Worthy Flank office. [phone] Dr Eloise Harman.  Received a call from DJ, Dr Eloise Harman nurse, she informed me she talked with Dr Eloise Harman this afternoon and he does want pt to start her coumadin back tonight, have called and left message on pts phone this information and to call CC.

## 2012-02-13 ENCOUNTER — Ambulatory Visit: Payer: Self-pay | Admitting: Cardiology

## 2012-02-13 DIAGNOSIS — I4891 Unspecified atrial fibrillation: Secondary | ICD-10-CM

## 2012-02-13 DIAGNOSIS — Z7901 Long term (current) use of anticoagulants: Secondary | ICD-10-CM

## 2012-02-13 LAB — POCT INR: INR: 1.1

## 2012-02-20 ENCOUNTER — Ambulatory Visit: Payer: Self-pay | Admitting: Cardiology

## 2012-02-20 DIAGNOSIS — I4891 Unspecified atrial fibrillation: Secondary | ICD-10-CM

## 2012-02-20 DIAGNOSIS — Z7901 Long term (current) use of anticoagulants: Secondary | ICD-10-CM

## 2012-02-20 LAB — POCT INR: INR: 3.9

## 2012-02-23 ENCOUNTER — Ambulatory Visit (INDEPENDENT_AMBULATORY_CARE_PROVIDER_SITE_OTHER): Payer: Medicare Other | Admitting: Cardiovascular Disease

## 2012-02-23 ENCOUNTER — Encounter: Payer: Self-pay | Admitting: Cardiovascular Disease

## 2012-02-23 VITALS — BP 140/82 | HR 56 | Ht 65.5 in | Wt 113.0 lb

## 2012-02-23 DIAGNOSIS — I4891 Unspecified atrial fibrillation: Secondary | ICD-10-CM | POA: Diagnosis not present

## 2012-02-23 NOTE — Assessment & Plan Note (Signed)
She is doing well .  She remains in A-Fib .  Will DC her Digoxin.  She is also on propranolol which is for her essential tremor and she does not want to stop that.  She has not had any syncope.  I will see her in 6 months for OV and ECG.

## 2012-02-23 NOTE — Patient Instructions (Addendum)
Your physician wants you to follow-up in: 6 MONTHS You will receive a reminder letter in the mail two months in advance. If you don't receive a letter, please call our office to schedule the follow-up appointment.   Your physician has recommended you make the following change in your medication:   STOP DIGOXIN DUE TO SLOW HEART RATE

## 2012-02-23 NOTE — Progress Notes (Signed)
    Angelica Mejia Date of Birth  October 27, 1918       [facility]    [place] [address]  [address] [place], [place]  [address]   [place], [place]  [address] [phone]     [phone]   Fax  [phone]    Fax [phone]  Problem List: 1. Atrial fibrillation 2. Essential tremor 3. Chronic anticoagulation 4. Hypertension 5. Raynaud's phenomenon  History of Present Illness:  Pt is doing well.  No complaints.  She lives at [facility] ([facility]  She has noticed that her HR is very low.  She has not had as much energy.  She denies any syncope.   Current Outpatient Prescriptions on File Prior to Visit  Medication Sig Dispense Refill  . Calcium Carbonate-Vitamin D (CALTRATE 600+D PO) Take by mouth 2 (two) times daily.        . digoxin (LANOXIN) 0.125 MG tablet Take 1 tablet (125 mcg total) by mouth daily.  90 tablet  3  . lisinopril (PRINIVIL,ZESTRIL) 10 MG tablet Take 20 mg by mouth daily.       . Multiple Vitamin (MULTIVITAMIN) tablet Take 1 tablet by mouth daily.        . propranolol (INDERAL) 80 MG tablet Take 80 mg by mouth daily.        Marland Kitchen triamterene-hydrochlorothiazide (MAXZIDE-25) 37.5-25 MG per tablet Take 1 tablet by mouth daily.        Marland Kitchen warfarin (COUMADIN) 5 MG tablet Take 5 mg by mouth daily. As directed         No Known Allergies  Past Medical History  Diagnosis Date  . Atrial fibrillation   . Tremor, essential   . Chronic anticoagulation   . Hypertension   . Raynaud phenomenon     Past Surgical History  Procedure Date  . Cholecystectomy     History  Smoking status  . Never Smoker   Smokeless tobacco  . Not on file    History  Alcohol Use No    Family History  Problem Relation Age of Onset  . Heart disease    . Hypertension    . Stroke    . Cancer      Reviw of Systems:  Reviewed in the HPI.  All other systems are negative.  Physical Exam: Blood pressure 140/82, pulse 56,  height 5' 5.5" (1.664 m), weight 113 lb (51.256 kg). General: Well developed, well nourished, in no acute distress.  Head: Normocephalic, atraumatic, sclera non-icteric, mucus membranes are moist,   Neck: Supple. Carotids are 2 + without bruits. No JVD  Lungs: Clear bilaterally to auscultation.  Heart: regular rate.  normal  S1 S2. No murmurs, gallops or rubs.  Abdomen: Soft, non-tender, non-distended with normal bowel sounds. No hepatomegaly. No rebound/guarding. No masses.  Msk:  Strength and tone are normal  Extremities: No clubbing or cyanosis. No edema.  Distal pedal pulses are 2+ and equal bilaterally.  Neuro: Alert and oriented X 3. Moves all extremities spontaneously.  Psych:  Responds to questions appropriately with a normal affect.  ECG:  Assessment / Plan:

## 2012-02-28 DIAGNOSIS — H1030 Unspecified acute conjunctivitis, unspecified eye: Secondary | ICD-10-CM | POA: Diagnosis not present

## 2012-02-28 DIAGNOSIS — Z947 Corneal transplant status: Secondary | ICD-10-CM | POA: Diagnosis not present

## 2012-03-05 ENCOUNTER — Ambulatory Visit: Payer: Self-pay | Admitting: General Practice

## 2012-03-05 ENCOUNTER — Ambulatory Visit: Payer: Medicare Other | Admitting: Nurse Practitioner

## 2012-03-05 DIAGNOSIS — I4891 Unspecified atrial fibrillation: Secondary | ICD-10-CM

## 2012-03-05 DIAGNOSIS — Z7901 Long term (current) use of anticoagulants: Secondary | ICD-10-CM

## 2012-03-19 ENCOUNTER — Ambulatory Visit: Payer: Self-pay | Admitting: Cardiovascular Disease

## 2012-03-19 DIAGNOSIS — Z7901 Long term (current) use of anticoagulants: Secondary | ICD-10-CM

## 2012-03-19 DIAGNOSIS — I4891 Unspecified atrial fibrillation: Secondary | ICD-10-CM

## 2012-03-19 LAB — POCT INR: INR: 1.9

## 2012-03-26 ENCOUNTER — Ambulatory Visit: Payer: Self-pay | Admitting: Cardiology

## 2012-03-26 DIAGNOSIS — I4891 Unspecified atrial fibrillation: Secondary | ICD-10-CM

## 2012-03-26 DIAGNOSIS — Z7901 Long term (current) use of anticoagulants: Secondary | ICD-10-CM

## 2012-04-09 ENCOUNTER — Ambulatory Visit: Payer: Self-pay | Admitting: Cardiovascular Disease

## 2012-04-09 DIAGNOSIS — I4891 Unspecified atrial fibrillation: Secondary | ICD-10-CM

## 2012-04-09 DIAGNOSIS — Z7901 Long term (current) use of anticoagulants: Secondary | ICD-10-CM

## 2012-04-22 DIAGNOSIS — H16109 Unspecified superficial keratitis, unspecified eye: Secondary | ICD-10-CM | POA: Diagnosis not present

## 2012-04-22 DIAGNOSIS — H1030 Unspecified acute conjunctivitis, unspecified eye: Secondary | ICD-10-CM | POA: Diagnosis not present

## 2012-04-22 DIAGNOSIS — H16049 Marginal corneal ulcer, unspecified eye: Secondary | ICD-10-CM | POA: Diagnosis not present

## 2012-04-23 ENCOUNTER — Ambulatory Visit: Payer: Self-pay | Admitting: Cardiovascular Disease

## 2012-04-23 DIAGNOSIS — Z7901 Long term (current) use of anticoagulants: Secondary | ICD-10-CM

## 2012-04-23 DIAGNOSIS — I4891 Unspecified atrial fibrillation: Secondary | ICD-10-CM

## 2012-05-07 ENCOUNTER — Ambulatory Visit: Payer: Self-pay | Admitting: Cardiovascular Disease

## 2012-05-07 DIAGNOSIS — I4891 Unspecified atrial fibrillation: Secondary | ICD-10-CM

## 2012-05-07 DIAGNOSIS — Z7901 Long term (current) use of anticoagulants: Secondary | ICD-10-CM

## 2012-05-20 DIAGNOSIS — Z961 Presence of intraocular lens: Secondary | ICD-10-CM | POA: Diagnosis not present

## 2012-05-20 DIAGNOSIS — I10 Essential (primary) hypertension: Secondary | ICD-10-CM | POA: Diagnosis not present

## 2012-05-20 DIAGNOSIS — H52209 Unspecified astigmatism, unspecified eye: Secondary | ICD-10-CM | POA: Diagnosis not present

## 2012-05-20 DIAGNOSIS — M810 Age-related osteoporosis without current pathological fracture: Secondary | ICD-10-CM | POA: Diagnosis not present

## 2012-05-20 DIAGNOSIS — H01009 Unspecified blepharitis unspecified eye, unspecified eyelid: Secondary | ICD-10-CM | POA: Diagnosis not present

## 2012-05-20 DIAGNOSIS — R82998 Other abnormal findings in urine: Secondary | ICD-10-CM | POA: Diagnosis not present

## 2012-05-21 ENCOUNTER — Ambulatory Visit: Payer: Self-pay | Admitting: Cardiology

## 2012-05-21 DIAGNOSIS — Z7901 Long term (current) use of anticoagulants: Secondary | ICD-10-CM

## 2012-05-21 DIAGNOSIS — I4891 Unspecified atrial fibrillation: Secondary | ICD-10-CM

## 2012-05-27 DIAGNOSIS — Z1331 Encounter for screening for depression: Secondary | ICD-10-CM | POA: Diagnosis not present

## 2012-05-27 DIAGNOSIS — I4891 Unspecified atrial fibrillation: Secondary | ICD-10-CM | POA: Diagnosis not present

## 2012-05-27 DIAGNOSIS — I10 Essential (primary) hypertension: Secondary | ICD-10-CM | POA: Diagnosis not present

## 2012-05-27 DIAGNOSIS — Z0000 Encounter for general adult medical examination without abnormal findings: Secondary | ICD-10-CM | POA: Diagnosis not present

## 2012-05-27 DIAGNOSIS — Z23 Encounter for immunization: Secondary | ICD-10-CM | POA: Diagnosis not present

## 2012-05-27 DIAGNOSIS — M810 Age-related osteoporosis without current pathological fracture: Secondary | ICD-10-CM | POA: Diagnosis not present

## 2012-05-28 DIAGNOSIS — Z1212 Encounter for screening for malignant neoplasm of rectum: Secondary | ICD-10-CM | POA: Diagnosis not present

## 2012-06-04 ENCOUNTER — Ambulatory Visit: Payer: Self-pay | Admitting: Cardiology

## 2012-06-04 DIAGNOSIS — Z7901 Long term (current) use of anticoagulants: Secondary | ICD-10-CM

## 2012-06-04 DIAGNOSIS — I4891 Unspecified atrial fibrillation: Secondary | ICD-10-CM

## 2012-06-06 ENCOUNTER — Other Ambulatory Visit (HOSPITAL_COMMUNITY): Payer: Self-pay | Admitting: *Deleted

## 2012-06-10 ENCOUNTER — Encounter (HOSPITAL_COMMUNITY)
Admission: RE | Admit: 2012-06-10 | Discharge: 2012-06-10 | Disposition: A | Discharge status: Home / Self Care | Payer: Medicare Other | Source: Ambulatory Visit | Attending: Internal Medicine | Admitting: Internal Medicine

## 2012-06-10 ENCOUNTER — Encounter (HOSPITAL_COMMUNITY): Payer: Self-pay

## 2012-06-10 DIAGNOSIS — M810 Age-related osteoporosis without current pathological fracture: Secondary | ICD-10-CM | POA: Insufficient documentation

## 2012-06-10 MED ORDER — SODIUM CHLORIDE 0.9 % IV SOLN
Freq: Once | INTRAVENOUS | Status: AC
  Administered 2012-06-10: 11:00:00 via INTRAVENOUS

## 2012-06-10 MED ORDER — ZOLEDRONIC ACID 5 MG/100ML IV SOLN
5.0000 mg | Freq: Once | INTRAVENOUS | Status: AC
  Administered 2012-06-10: 5 mg via INTRAVENOUS
  Filled 2012-06-10: qty 100

## 2012-06-25 ENCOUNTER — Ambulatory Visit: Payer: Self-pay | Admitting: Cardiovascular Disease

## 2012-06-25 DIAGNOSIS — I4891 Unspecified atrial fibrillation: Secondary | ICD-10-CM

## 2012-06-25 DIAGNOSIS — Z7901 Long term (current) use of anticoagulants: Secondary | ICD-10-CM

## 2012-06-25 LAB — POCT INR: INR: 3.5

## 2012-07-09 ENCOUNTER — Ambulatory Visit: Payer: Self-pay | Admitting: Cardiovascular Disease

## 2012-07-09 DIAGNOSIS — Z7901 Long term (current) use of anticoagulants: Secondary | ICD-10-CM

## 2012-07-09 DIAGNOSIS — I4891 Unspecified atrial fibrillation: Secondary | ICD-10-CM

## 2012-07-09 LAB — POCT INR: INR: 3.6

## 2012-07-16 DIAGNOSIS — M899 Disorder of bone, unspecified: Secondary | ICD-10-CM | POA: Diagnosis not present

## 2012-07-23 ENCOUNTER — Ambulatory Visit: Payer: Self-pay | Admitting: Cardiology

## 2012-07-23 DIAGNOSIS — Z7901 Long term (current) use of anticoagulants: Secondary | ICD-10-CM

## 2012-07-23 DIAGNOSIS — I4891 Unspecified atrial fibrillation: Secondary | ICD-10-CM

## 2012-07-23 LAB — POCT INR: INR: 3.6

## 2012-07-25 DIAGNOSIS — H10509 Unspecified blepharoconjunctivitis, unspecified eye: Secondary | ICD-10-CM | POA: Diagnosis not present

## 2012-07-29 ENCOUNTER — Telehealth: Payer: Self-pay

## 2012-07-29 ENCOUNTER — Ambulatory Visit (INDEPENDENT_AMBULATORY_CARE_PROVIDER_SITE_OTHER): Payer: Medicare Other | Admitting: Cardiology

## 2012-07-29 DIAGNOSIS — I4891 Unspecified atrial fibrillation: Secondary | ICD-10-CM

## 2012-07-29 DIAGNOSIS — Z7901 Long term (current) use of anticoagulants: Secondary | ICD-10-CM

## 2012-07-29 LAB — POCT INR: INR: 2

## 2012-07-29 NOTE — Telephone Encounter (Signed)
Pt is not coming to office on due date for CVRR clinic. The nurse, Corey Harold @ [facility] will complete draw today then via fax results to [facility] today. Notified Tiffany, RN of message taken.

## 2012-08-06 ENCOUNTER — Ambulatory Visit: Payer: Self-pay | Admitting: Cardiology

## 2012-08-06 DIAGNOSIS — Z7901 Long term (current) use of anticoagulants: Secondary | ICD-10-CM

## 2012-08-06 DIAGNOSIS — I4891 Unspecified atrial fibrillation: Secondary | ICD-10-CM

## 2012-08-12 ENCOUNTER — Telehealth: Payer: Self-pay | Admitting: Cardiovascular Disease

## 2012-08-12 NOTE — Telephone Encounter (Signed)
Pt needs new Rx on Digoxin 0.125 mg sent to [facility] she is out

## 2012-08-13 ENCOUNTER — Ambulatory Visit: Payer: Self-pay | Admitting: Cardiology

## 2012-08-13 DIAGNOSIS — Z7901 Long term (current) use of anticoagulants: Secondary | ICD-10-CM

## 2012-08-13 DIAGNOSIS — I4891 Unspecified atrial fibrillation: Secondary | ICD-10-CM

## 2012-08-13 NOTE — Telephone Encounter (Signed)
lmcb

## 2012-08-15 NOTE — Telephone Encounter (Signed)
Called and spoke with the pt, I informed her that on her last OV with Dr. Elease Hashimoto 02/23/2012 she was to D/C taking the digoxin 0.125 mg.  She stated she did stop taking it for a while but started back taking it on her own.  Pt is requesting Dr. Elease Hashimoto place her back on the digoxin 0.125 mg, and send in a refill also.  Caralee Ates, CMA

## 2012-08-15 NOTE — Telephone Encounter (Signed)
Received request from pharmacy to refill Digoxin when Dr Elease Hashimoto DC'd it 6 months ago.  Pt was called and she said she went back on digoxin on her own because sometimes at night her heart rate will race. I told her Dr Elease Hashimoto stopped the med because her heart rate was too slow, I asked her what her bp/ p were and she stated that she has not had it taken in along time. I told her that Dr Elease Hashimoto will not fill till seen, she has an appointment next month and she will wait to discuss it then. I will forward to dr Elease Hashimoto to review.

## 2012-08-19 DIAGNOSIS — Z947 Corneal transplant status: Secondary | ICD-10-CM | POA: Diagnosis not present

## 2012-08-19 DIAGNOSIS — H1030 Unspecified acute conjunctivitis, unspecified eye: Secondary | ICD-10-CM | POA: Diagnosis not present

## 2012-08-28 ENCOUNTER — Ambulatory Visit: Payer: Self-pay | Admitting: Cardiology

## 2012-08-28 DIAGNOSIS — H16109 Unspecified superficial keratitis, unspecified eye: Secondary | ICD-10-CM | POA: Diagnosis not present

## 2012-08-28 DIAGNOSIS — I4891 Unspecified atrial fibrillation: Secondary | ICD-10-CM

## 2012-08-28 DIAGNOSIS — Z7901 Long term (current) use of anticoagulants: Secondary | ICD-10-CM

## 2012-08-28 DIAGNOSIS — Z947 Corneal transplant status: Secondary | ICD-10-CM | POA: Diagnosis not present

## 2012-08-28 DIAGNOSIS — H04129 Dry eye syndrome of unspecified lacrimal gland: Secondary | ICD-10-CM | POA: Diagnosis not present

## 2012-08-28 DIAGNOSIS — H1030 Unspecified acute conjunctivitis, unspecified eye: Secondary | ICD-10-CM | POA: Diagnosis not present

## 2012-08-28 LAB — POCT INR: INR: 1.6

## 2012-08-30 ENCOUNTER — Other Ambulatory Visit: Payer: Self-pay | Admitting: Cardiovascular Disease

## 2012-08-30 MED ORDER — LISINOPRIL 10 MG PO TABS: 20.0000 mg | ORAL_TABLET | Freq: Every day | ORAL | Status: DC

## 2012-08-30 MED ORDER — WARFARIN SODIUM 5 MG PO TABS: ORAL_TABLET | ORAL | Status: DC

## 2012-08-30 NOTE — Telephone Encounter (Signed)
New problem:   Warfarin 5 mg.   Lisinopril  10 mg    [facility]

## 2012-08-30 NOTE — Telephone Encounter (Signed)
3 months refill done

## 2012-08-30 NOTE — Addendum Note (Signed)
Addended by: Jeannine Kitten on: 08/30/2012 04:38 PM   Modules accepted: Orders

## 2012-08-30 NOTE — Telephone Encounter (Signed)
Fax Received. Refill Completed. Angelica Mejia (R.M.A)   

## 2012-09-09 ENCOUNTER — Ambulatory Visit: Payer: Self-pay | Admitting: Internal Medicine

## 2012-09-09 DIAGNOSIS — I4891 Unspecified atrial fibrillation: Secondary | ICD-10-CM

## 2012-09-09 DIAGNOSIS — Z7901 Long term (current) use of anticoagulants: Secondary | ICD-10-CM

## 2012-09-11 ENCOUNTER — Ambulatory Visit: Payer: Medicare Other | Admitting: Cardiovascular Disease

## 2012-09-20 ENCOUNTER — Ambulatory Visit: Payer: Medicare Other | Admitting: Cardiovascular Disease

## 2012-09-23 ENCOUNTER — Ambulatory Visit (INDEPENDENT_AMBULATORY_CARE_PROVIDER_SITE_OTHER): Payer: 59 | Admitting: Cardiology

## 2012-09-23 DIAGNOSIS — Z7901 Long term (current) use of anticoagulants: Secondary | ICD-10-CM | POA: Diagnosis not present

## 2012-09-23 DIAGNOSIS — I4891 Unspecified atrial fibrillation: Secondary | ICD-10-CM | POA: Diagnosis not present

## 2012-10-01 ENCOUNTER — Encounter: Payer: Self-pay | Admitting: Cardiovascular Disease

## 2012-10-07 ENCOUNTER — Ambulatory Visit: Payer: Self-pay | Admitting: Cardiovascular Disease

## 2012-10-07 DIAGNOSIS — I4891 Unspecified atrial fibrillation: Secondary | ICD-10-CM

## 2012-10-07 DIAGNOSIS — Z7901 Long term (current) use of anticoagulants: Secondary | ICD-10-CM

## 2012-10-17 ENCOUNTER — Ambulatory Visit (INDEPENDENT_AMBULATORY_CARE_PROVIDER_SITE_OTHER): Payer: Medicare Other | Admitting: Cardiovascular Disease

## 2012-10-17 ENCOUNTER — Encounter: Payer: Self-pay | Admitting: Cardiovascular Disease

## 2012-10-17 VITALS — BP 128/80 | HR 88 | Ht 65.0 in | Wt 112.4 lb

## 2012-10-17 DIAGNOSIS — R002 Palpitations: Secondary | ICD-10-CM | POA: Diagnosis not present

## 2012-10-17 DIAGNOSIS — I4891 Unspecified atrial fibrillation: Secondary | ICD-10-CM | POA: Diagnosis not present

## 2012-10-17 LAB — CBC
HCT: 37.9 % (ref 36.0–46.0)
MCHC: 33.7 g/dL (ref 30.0–36.0)
MCV: 90.2 fl (ref 78.0–100.0)
RDW: 13.9 % (ref 11.5–14.6)

## 2012-10-17 LAB — BASIC METABOLIC PANEL
BUN: 34 mg/dL — ABNORMAL HIGH (ref 6–23)
CO2: 28 mEq/L (ref 19–32)
Calcium: 9.5 mg/dL (ref 8.4–10.5)
Creatinine, Ser: 1.4 mg/dL — ABNORMAL HIGH (ref 0.4–1.2)
GFR: 39.04 mL/min — ABNORMAL LOW (ref >60.00)
Glucose, Bld: 95 mg/dL (ref 70–99)
Sodium: 137 mEq/L (ref 135–145)

## 2012-10-17 MED ORDER — PROPRANOLOL HCL 10 MG PO TABS: 10.0000 mg | ORAL_TABLET | Freq: Four times a day (QID) | ORAL | Status: DC | PRN

## 2012-10-17 NOTE — Patient Instructions (Addendum)
Your physician wants you to follow-up in: 6 MONTHS   You will receive a reminder letter in the mail two months in advance. If you don't receive a letter, please call our office to schedule the follow-up appointment.   Your physician recommends that you return for lab work in: TODAY BMET/ TSH/CBC  Your physician has recommended you make the following change in your medication:   START PROPRANOLOL 10 MG AS NEEDED FOR PALPITATIONS, YOU MAY TAKE ONE EVERY THIRTY MINUTES UP TO 4 DOSES. THIS MAY LOWER YOUR BLOOD PRESSURE OR MAKE YOU DIZZY.

## 2012-10-17 NOTE — Progress Notes (Signed)
    Angelica Mejia Date of Birth  05/10/1919       [facility]    [place] [address]  [address] [place], [place]  [address]   [place], [place]  [address] [phone]     [phone]   Fax  [phone]    Fax [phone]  Problem List: 1. Atrial fibrillation 2. Essential tremor 3. Chronic anticoagulation 4. Hypertension 5. Raynaud's phenomenon  History of Present Illness:  Pt is doing well.  No complaints.  She lives at [facility] ([facility]).  She has noticed that her HR is very low.  She has not had as much energy.  She denies any syncope.   October 17, 2012:  She is doing well.  She had an episode of more severe palpitations.  The episode lasted for 3 hours and resolved overnight.   Current Outpatient Prescriptions on File Prior to Visit  Medication Sig Dispense Refill  . Calcium Carbonate-Vitamin D (CALTRATE 600+D PO) Take by mouth 2 (two) times daily.        Marland Kitchen lisinopril (PRINIVIL,ZESTRIL) 10 MG tablet Take 2 tablets (20 mg total) by mouth daily.  180 tablet  3  . Multiple Vitamin (MULTIVITAMIN) tablet Take 1 tablet by mouth daily.        . propranolol (INDERAL) 80 MG tablet Take 80 mg by mouth daily.        Marland Kitchen warfarin (COUMADIN) 5 MG tablet Take as directed by coumadin clinic  60 tablet  1   No current facility-administered medications on file prior to visit.    No Known Allergies  Past Medical History  Diagnosis Date  . Atrial fibrillation   . Tremor, essential   . Chronic anticoagulation   . Hypertension   . Raynaud phenomenon     Past Surgical History  Procedure Laterality Date  . Cholecystectomy      History  Smoking status  . Never Smoker   Smokeless tobacco  . Not on file    History  Alcohol Use No    Family History  Problem Relation Age of Onset  . Heart disease    . Hypertension    . Stroke    . Cancer      Reviw of Systems:  Reviewed in the HPI.  All other systems are  negative.  Physical Exam: Blood pressure 128/80, pulse 88, height 5\' 5"  (1.651 m), weight 112 lb 6.4 oz (50.984 kg). General: Well developed, well nourished, in no acute distress.  Head: Normocephalic, atraumatic, sclera non-icteric, mucus membranes are moist,   Neck: Supple. Carotids are 2 + without bruits. No JVD  Lungs: Clear bilaterally to auscultation.  Heart: irregularly irregular.  normal  S1 S2. No murmurs, gallops or rubs.  Abdomen: Soft, non-tender, non-distended with normal bowel sounds. No hepatomegaly. No rebound/guarding. No masses.  Msk:  Strength and tone are normal  Extremities: No clubbing or cyanosis. No edema.  Distal pedal pulses are 2+ and equal bilaterally.  Neuro: Alert and oriented X 3. Moves all extremities spontaneously.  Psych:  Responds to questions appropriately with a normal affect.  ECG: 10/17/2012: Atrial fibrillation with her heart rate of 88. She has nonspecific ST and T wave changes. Assessment / Plan:

## 2012-10-17 NOTE — Assessment & Plan Note (Signed)
Angelica Mejia presents today with some increasing palpitations. She has chronic atrial fibrillation. I'm not sure of the etiology of these palpitations but it's like it likely that she's having atrial fibrillation with a rapid ventricular response.   I will  give her some propranolol 10 mg  to take 4 times a day  on an as-needed basis. She will continue her scheduled propranolol.

## 2012-10-21 ENCOUNTER — Ambulatory Visit: Payer: Self-pay | Admitting: Cardiology

## 2012-10-21 DIAGNOSIS — Z7901 Long term (current) use of anticoagulants: Secondary | ICD-10-CM

## 2012-10-21 DIAGNOSIS — I4891 Unspecified atrial fibrillation: Secondary | ICD-10-CM

## 2012-10-21 LAB — POCT INR: INR: 2.2

## 2012-10-23 DIAGNOSIS — H903 Sensorineural hearing loss, bilateral: Secondary | ICD-10-CM | POA: Diagnosis not present

## 2012-10-23 DIAGNOSIS — H6120 Impacted cerumen, unspecified ear: Secondary | ICD-10-CM | POA: Diagnosis not present

## 2012-11-05 ENCOUNTER — Ambulatory Visit (INDEPENDENT_AMBULATORY_CARE_PROVIDER_SITE_OTHER): Payer: Medicare Other | Admitting: Cardiovascular Disease

## 2012-11-05 DIAGNOSIS — I4891 Unspecified atrial fibrillation: Secondary | ICD-10-CM

## 2012-11-05 DIAGNOSIS — Z7901 Long term (current) use of anticoagulants: Secondary | ICD-10-CM

## 2012-11-05 LAB — POCT INR: INR: 1.5

## 2012-11-19 ENCOUNTER — Ambulatory Visit (INDEPENDENT_AMBULATORY_CARE_PROVIDER_SITE_OTHER): Payer: Medicare Other | Admitting: Cardiovascular Disease

## 2012-11-19 DIAGNOSIS — I4891 Unspecified atrial fibrillation: Secondary | ICD-10-CM

## 2012-11-19 DIAGNOSIS — Z7901 Long term (current) use of anticoagulants: Secondary | ICD-10-CM

## 2012-11-25 DIAGNOSIS — H1030 Unspecified acute conjunctivitis, unspecified eye: Secondary | ICD-10-CM | POA: Diagnosis not present

## 2012-11-28 ENCOUNTER — Ambulatory Visit (INDEPENDENT_AMBULATORY_CARE_PROVIDER_SITE_OTHER): Payer: Medicare Other | Admitting: Cardiology

## 2012-11-28 DIAGNOSIS — Z7901 Long term (current) use of anticoagulants: Secondary | ICD-10-CM

## 2012-11-28 DIAGNOSIS — I4891 Unspecified atrial fibrillation: Secondary | ICD-10-CM

## 2012-12-02 ENCOUNTER — Telehealth: Payer: Self-pay | Admitting: Cardiovascular Disease

## 2012-12-02 MED ORDER — PROPRANOLOL HCL 80 MG PO TABS: 80.0000 mg | ORAL_TABLET | Freq: Every day | ORAL | Status: DC

## 2012-12-02 NOTE — Telephone Encounter (Signed)
Spoke with patient and she was under the impression after her last office visit that she was to just take the Propranolol 10 mg four times a day and wanted to go back on her Propranolol 80 mg daily. After reviewing the office note it stated to use 10 mg on an as needed basis and to continue the 80 mg daily. Explained to patient, verbalized understanding. Called the Propranolol 80 mg to [facility] and sent 90 day supply to her mail order

## 2012-12-02 NOTE — Telephone Encounter (Signed)
New problem    C/o side effect from medication propranolol  80 mg

## 2012-12-09 ENCOUNTER — Other Ambulatory Visit: Payer: Self-pay | Admitting: *Deleted

## 2012-12-09 MED ORDER — PROPRANOLOL HCL ER 80 MG PO CP24: 80.0000 mg | ORAL_CAPSULE | Freq: Every day | ORAL | Status: DC

## 2012-12-09 NOTE — Telephone Encounter (Signed)
Received letter from [facility] stating pt has always had propranolol ER not regular. Script was changed,

## 2012-12-13 ENCOUNTER — Telehealth: Payer: Self-pay | Admitting: Cardiovascular Disease

## 2012-12-13 DIAGNOSIS — R002 Palpitations: Secondary | ICD-10-CM

## 2012-12-13 DIAGNOSIS — H10509 Unspecified blepharoconjunctivitis, unspecified eye: Secondary | ICD-10-CM | POA: Diagnosis not present

## 2012-12-13 NOTE — Telephone Encounter (Signed)
Opened in Error.

## 2012-12-13 NOTE — Telephone Encounter (Signed)
New Prob     Needing clarafication of PROPRANOLOL.Marland Kitchen Please call.

## 2012-12-13 NOTE — Telephone Encounter (Signed)
Rosion from [facility]	[place] to clarify a prescription on Pt. Pt has been taken Propanolol ER 80 mg once a day . A prescription for plain Propanolol was send to the Pharmacy on 12/02/12. According to pt's records on  OV on 10/17/12 a prescription for Propanolol ER 80 mg once a day, was send to [facility] by Mahalia Longest RN ; pharmacy will dispense the Propanolol ER 80 mg  Once a day.

## 2012-12-16 ENCOUNTER — Ambulatory Visit: Payer: Medicare Other | Admitting: Cardiovascular Disease

## 2012-12-16 NOTE — Telephone Encounter (Signed)
This was reviewed, RX resent as originally prescribed.

## 2012-12-17 ENCOUNTER — Ambulatory Visit (INDEPENDENT_AMBULATORY_CARE_PROVIDER_SITE_OTHER): Payer: Self-pay | Admitting: Cardiovascular Disease

## 2012-12-17 DIAGNOSIS — Z7901 Long term (current) use of anticoagulants: Secondary | ICD-10-CM

## 2012-12-17 DIAGNOSIS — I4891 Unspecified atrial fibrillation: Secondary | ICD-10-CM

## 2012-12-17 LAB — POCT INR: INR: 2.7

## 2012-12-31 ENCOUNTER — Other Ambulatory Visit: Payer: Self-pay

## 2012-12-31 DIAGNOSIS — Z1231 Encounter for screening mammogram for malignant neoplasm of breast: Secondary | ICD-10-CM

## 2013-01-07 ENCOUNTER — Ambulatory Visit (INDEPENDENT_AMBULATORY_CARE_PROVIDER_SITE_OTHER): Payer: Medicare Other | Admitting: Cardiology

## 2013-01-07 DIAGNOSIS — Z7901 Long term (current) use of anticoagulants: Secondary | ICD-10-CM

## 2013-01-07 DIAGNOSIS — I4891 Unspecified atrial fibrillation: Secondary | ICD-10-CM

## 2013-01-29 ENCOUNTER — Ambulatory Visit: Payer: Medicare Other

## 2013-02-04 ENCOUNTER — Ambulatory Visit (INDEPENDENT_AMBULATORY_CARE_PROVIDER_SITE_OTHER): Payer: Medicare Other | Admitting: Internal Medicine

## 2013-02-04 DIAGNOSIS — I4891 Unspecified atrial fibrillation: Secondary | ICD-10-CM

## 2013-02-04 DIAGNOSIS — Z7901 Long term (current) use of anticoagulants: Secondary | ICD-10-CM

## 2013-02-20 ENCOUNTER — Ambulatory Visit
Admission: RE | Admit: 2013-02-20 | Discharge: 2013-02-20 | Disposition: A | Discharge status: Home / Self Care | Payer: Medicare Other | Source: Ambulatory Visit

## 2013-02-20 DIAGNOSIS — Z1231 Encounter for screening mammogram for malignant neoplasm of breast: Secondary | ICD-10-CM

## 2013-02-24 DIAGNOSIS — Z961 Presence of intraocular lens: Secondary | ICD-10-CM | POA: Diagnosis not present

## 2013-02-24 DIAGNOSIS — H10509 Unspecified blepharoconjunctivitis, unspecified eye: Secondary | ICD-10-CM | POA: Diagnosis not present

## 2013-03-04 ENCOUNTER — Ambulatory Visit (INDEPENDENT_AMBULATORY_CARE_PROVIDER_SITE_OTHER): Payer: Medicare Other | Admitting: Cardiology

## 2013-03-04 DIAGNOSIS — I4891 Unspecified atrial fibrillation: Secondary | ICD-10-CM

## 2013-03-04 DIAGNOSIS — Z7901 Long term (current) use of anticoagulants: Secondary | ICD-10-CM

## 2013-03-26 DIAGNOSIS — N318 Other neuromuscular dysfunction of bladder: Secondary | ICD-10-CM | POA: Diagnosis not present

## 2013-03-26 DIAGNOSIS — Z124 Encounter for screening for malignant neoplasm of cervix: Secondary | ICD-10-CM | POA: Diagnosis not present

## 2013-04-08 ENCOUNTER — Ambulatory Visit (INDEPENDENT_AMBULATORY_CARE_PROVIDER_SITE_OTHER): Payer: Medicare Other | Admitting: Pharmacist

## 2013-04-08 DIAGNOSIS — I4891 Unspecified atrial fibrillation: Secondary | ICD-10-CM

## 2013-04-08 DIAGNOSIS — Z7901 Long term (current) use of anticoagulants: Secondary | ICD-10-CM

## 2013-04-18 ENCOUNTER — Encounter: Payer: Self-pay | Admitting: Cardiovascular Disease

## 2013-04-18 ENCOUNTER — Ambulatory Visit (INDEPENDENT_AMBULATORY_CARE_PROVIDER_SITE_OTHER): Payer: Medicare Other | Admitting: Cardiovascular Disease

## 2013-04-18 VITALS — BP 122/82 | HR 80 | Ht 65.0 in

## 2013-04-18 DIAGNOSIS — I4891 Unspecified atrial fibrillation: Secondary | ICD-10-CM

## 2013-04-18 DIAGNOSIS — I10 Essential (primary) hypertension: Secondary | ICD-10-CM | POA: Diagnosis not present

## 2013-04-18 MED ORDER — TRIAMTERENE-HCTZ 37.5-25 MG PO TABS: 1.0000 | ORAL_TABLET | Freq: Every day | ORAL | Status: DC

## 2013-04-18 NOTE — Progress Notes (Signed)
Angelica Mejia Date of Birth  08-19-1918       [facility]    [place] [address]  [address], [address] [place], [place]  [address]   [place], [place]  [address] [phone]     [phone]   Fax  [phone]    Fax [phone]  Problem List: 1. Atrial fibrillation 2. Essential tremor 3. Chronic anticoagulation 4. Hypertension 5. Raynaud's phenomenon  History of Present Illness:  Pt is doing well.  No complaints.  She lives at [facility] ([facility]  She has noticed that her HR is very low.  She has not had as much energy.  She denies any syncope.   October 17, 2012:  She is doing well.  She had an episode of more severe palpitations.  The episode lasted for 3 hours and resolved overnight.   Sept. 12., 2014:  Angelica Mejia is doing well.  She is able to do her normal activities.  She goes to see her husband at [facility].  She is at [facility].   Asymptomatic from an A fib standpoint.    Current Outpatient Prescriptions on File Prior to Visit  Medication Sig Dispense Refill  . Calcium Carbonate-Vitamin D (CALTRATE 600+D PO) Take by mouth 2 (two) times daily.        Marland Kitchen lisinopril (PRINIVIL,ZESTRIL) 10 MG tablet Take 2 tablets (20 mg total) by mouth daily.  180 tablet  3  . Multiple Vitamin (MULTIVITAMIN) tablet Take 1 tablet by mouth daily.        . propranolol (INDERAL) 10 MG tablet Take 1 tablet (10 mg total) by mouth 4 (four) times daily as needed (FOR PALPITATIONS, MAY TAKE ONE EVERY THIRTY MINUTES UP TO 4 DOSES.).  30 tablet  5  . propranolol ER (INDERAL LA) 80 MG 24 hr capsule Take 1 capsule (80 mg total) by mouth daily.  90 capsule  3  . warfarin (COUMADIN) 5 MG tablet Take as directed by coumadin clinic  60 tablet  1   No current facility-administered medications on file prior to visit.    No Known Allergies  Past Medical History  Diagnosis Date  . Atrial fibrillation   . Tremor, essential   . Chronic  anticoagulation   . Hypertension   . Raynaud phenomenon     Past Surgical History  Procedure Laterality Date  . Cholecystectomy      History  Smoking status  . Never Smoker   Smokeless tobacco  . Not on file    History  Alcohol Use No    Family History  Problem Relation Age of Onset  . Heart disease    . Hypertension    . Stroke    . Cancer      Reviw of Systems:  Reviewed in the HPI.  All other systems are negative.  Physical Exam: Blood pressure 122/82, pulse 80, height 5\' 5"  (1.651 m). General: Well developed, well nourished, in no acute distress.  Head: Normocephalic, atraumatic, sclera non-icteric, mucus membranes are moist,   Neck: Supple. Carotids are 2 + without bruits. No JVD  Lungs: Clear bilaterally to auscultation.  Heart: irregularly irregular.  normal  S1 S2. No murmurs, gallops or rubs.  Abdomen: Soft, non-tender, non-distended with normal bowel sounds. No hepatomegaly. No rebound/guarding. No masses.  Msk:  Strength and tone are normal  Extremities: No clubbing or cyanosis. No edema.  Distal pedal pulses are 2+ and equal bilaterally.  Neuro: Alert and  oriented X 3. Moves all extremities spontaneously.  Psych:  Responds to questions appropriately with a normal affect.  ECG: Sept. 12, 2014:   Atrial fibrillation with her heart rate of 80. She has nonspecific ST and T wave changes.  The ECG is unchanged from previous tracings.  Assessment / Plan:

## 2013-04-18 NOTE — Patient Instructions (Addendum)
OK to use Zyrtec ( Certrizine) or claritin .  Both are over the counter.  Your physician wants you to follow-up in: 6 MONTHS  You will receive a reminder letter in the mail two months in advance. If you don't receive a letter, please call our office to schedule the follow-up appointment.   Your physician recommends that you continue on your current medications as directed. Please refer to the Current Medication list given to you today.

## 2013-04-18 NOTE — Assessment & Plan Note (Signed)
Stable

## 2013-04-18 NOTE — Assessment & Plan Note (Signed)
Angelica Mejia appears to be stable. She remains asymptomatic. She has been seen in our Coumadin clinic.

## 2013-05-19 DIAGNOSIS — R21 Rash and other nonspecific skin eruption: Secondary | ICD-10-CM | POA: Diagnosis not present

## 2013-05-19 DIAGNOSIS — B86 Scabies: Secondary | ICD-10-CM | POA: Diagnosis not present

## 2013-05-20 ENCOUNTER — Ambulatory Visit (INDEPENDENT_AMBULATORY_CARE_PROVIDER_SITE_OTHER): Payer: Medicare Other | Admitting: Cardiovascular Disease

## 2013-05-20 DIAGNOSIS — Z7901 Long term (current) use of anticoagulants: Secondary | ICD-10-CM

## 2013-05-20 DIAGNOSIS — I4891 Unspecified atrial fibrillation: Secondary | ICD-10-CM

## 2013-05-27 DIAGNOSIS — R509 Fever, unspecified: Secondary | ICD-10-CM | POA: Diagnosis not present

## 2013-05-27 DIAGNOSIS — N3000 Acute cystitis without hematuria: Secondary | ICD-10-CM | POA: Diagnosis not present

## 2013-05-27 DIAGNOSIS — M818 Other osteoporosis without current pathological fracture: Secondary | ICD-10-CM | POA: Diagnosis not present

## 2013-05-27 DIAGNOSIS — N760 Acute vaginitis: Secondary | ICD-10-CM | POA: Diagnosis not present

## 2013-05-27 DIAGNOSIS — I1310 Hypertensive heart and chronic kidney disease without heart failure, with stage 1 through stage 4 chronic kidney disease, or unspecified chronic kidney disease: Secondary | ICD-10-CM | POA: Diagnosis not present

## 2013-05-27 DIAGNOSIS — N051 Unspecified nephritic syndrome with focal and segmental glomerular lesions: Secondary | ICD-10-CM | POA: Diagnosis not present

## 2013-06-10 ENCOUNTER — Ambulatory Visit (INDEPENDENT_AMBULATORY_CARE_PROVIDER_SITE_OTHER): Payer: Medicare Other | Admitting: Cardiology

## 2013-06-10 DIAGNOSIS — Z7901 Long term (current) use of anticoagulants: Secondary | ICD-10-CM

## 2013-06-10 DIAGNOSIS — I4891 Unspecified atrial fibrillation: Secondary | ICD-10-CM

## 2013-06-10 LAB — POCT INR: INR: 3.2

## 2013-06-20 DIAGNOSIS — Z947 Corneal transplant status: Secondary | ICD-10-CM | POA: Diagnosis not present

## 2013-06-20 DIAGNOSIS — H1030 Unspecified acute conjunctivitis, unspecified eye: Secondary | ICD-10-CM | POA: Diagnosis not present

## 2013-06-24 ENCOUNTER — Ambulatory Visit (INDEPENDENT_AMBULATORY_CARE_PROVIDER_SITE_OTHER): Payer: Medicare Other | Admitting: Cardiology

## 2013-06-24 DIAGNOSIS — I4891 Unspecified atrial fibrillation: Secondary | ICD-10-CM

## 2013-06-24 DIAGNOSIS — Z7901 Long term (current) use of anticoagulants: Secondary | ICD-10-CM

## 2013-06-24 LAB — POCT INR: INR: 3.3

## 2013-06-30 DIAGNOSIS — L821 Other seborrheic keratosis: Secondary | ICD-10-CM | POA: Diagnosis not present

## 2013-06-30 DIAGNOSIS — L738 Other specified follicular disorders: Secondary | ICD-10-CM | POA: Diagnosis not present

## 2013-06-30 DIAGNOSIS — L820 Inflamed seborrheic keratosis: Secondary | ICD-10-CM | POA: Diagnosis not present

## 2013-07-04 DIAGNOSIS — Z23 Encounter for immunization: Secondary | ICD-10-CM | POA: Diagnosis not present

## 2013-07-08 ENCOUNTER — Ambulatory Visit (INDEPENDENT_AMBULATORY_CARE_PROVIDER_SITE_OTHER): Payer: Medicare Other | Admitting: Pharmacist

## 2013-07-08 DIAGNOSIS — Z7901 Long term (current) use of anticoagulants: Secondary | ICD-10-CM

## 2013-07-08 DIAGNOSIS — I4891 Unspecified atrial fibrillation: Secondary | ICD-10-CM

## 2013-07-08 LAB — POCT INR: INR: 2.7

## 2013-07-28 LAB — POCT INR: INR: 2.1

## 2013-07-29 ENCOUNTER — Ambulatory Visit (INDEPENDENT_AMBULATORY_CARE_PROVIDER_SITE_OTHER): Payer: Medicare Other | Admitting: Cardiovascular Disease

## 2013-07-29 DIAGNOSIS — Z7901 Long term (current) use of anticoagulants: Secondary | ICD-10-CM

## 2013-07-29 DIAGNOSIS — I4891 Unspecified atrial fibrillation: Secondary | ICD-10-CM

## 2013-08-01 ENCOUNTER — Other Ambulatory Visit: Payer: Self-pay | Admitting: *Deleted

## 2013-08-01 MED ORDER — PROPRANOLOL HCL ER 80 MG PO CP24: 80.0000 mg | ORAL_CAPSULE | Freq: Every day | ORAL | Status: DC

## 2013-08-01 MED ORDER — WARFARIN SODIUM 5 MG PO TABS: ORAL_TABLET | ORAL | Status: DC

## 2013-08-05 DIAGNOSIS — H10509 Unspecified blepharoconjunctivitis, unspecified eye: Secondary | ICD-10-CM | POA: Diagnosis not present

## 2013-08-23 ENCOUNTER — Other Ambulatory Visit: Payer: Self-pay | Admitting: Cardiovascular Disease

## 2013-08-26 ENCOUNTER — Ambulatory Visit (INDEPENDENT_AMBULATORY_CARE_PROVIDER_SITE_OTHER): Payer: Medicare Other | Admitting: Pharmacist

## 2013-08-26 DIAGNOSIS — I4891 Unspecified atrial fibrillation: Secondary | ICD-10-CM

## 2013-08-26 DIAGNOSIS — Z7901 Long term (current) use of anticoagulants: Secondary | ICD-10-CM

## 2013-08-26 LAB — POCT INR: INR: 1.7

## 2013-09-19 ENCOUNTER — Ambulatory Visit (INDEPENDENT_AMBULATORY_CARE_PROVIDER_SITE_OTHER): Payer: Medicare Other | Admitting: Cardiovascular Disease

## 2013-09-19 DIAGNOSIS — I4891 Unspecified atrial fibrillation: Secondary | ICD-10-CM

## 2013-09-19 DIAGNOSIS — Z7901 Long term (current) use of anticoagulants: Secondary | ICD-10-CM

## 2013-09-19 LAB — POCT INR: INR: 1.8

## 2013-10-03 ENCOUNTER — Ambulatory Visit (INDEPENDENT_AMBULATORY_CARE_PROVIDER_SITE_OTHER): Payer: Medicare Other | Admitting: Cardiovascular Disease

## 2013-10-03 DIAGNOSIS — Z7901 Long term (current) use of anticoagulants: Secondary | ICD-10-CM | POA: Diagnosis not present

## 2013-10-03 DIAGNOSIS — I4891 Unspecified atrial fibrillation: Secondary | ICD-10-CM

## 2013-10-03 LAB — POCT INR: INR: 2.3

## 2013-10-21 ENCOUNTER — Ambulatory Visit (INDEPENDENT_AMBULATORY_CARE_PROVIDER_SITE_OTHER): Payer: Medicare Other | Admitting: Pharmacist

## 2013-10-21 DIAGNOSIS — I4891 Unspecified atrial fibrillation: Secondary | ICD-10-CM

## 2013-10-21 DIAGNOSIS — Z7901 Long term (current) use of anticoagulants: Secondary | ICD-10-CM

## 2013-10-21 LAB — POCT INR: INR: 2.1

## 2013-10-22 ENCOUNTER — Ambulatory Visit (INDEPENDENT_AMBULATORY_CARE_PROVIDER_SITE_OTHER): Payer: Medicare Other | Admitting: Cardiovascular Disease

## 2013-10-22 ENCOUNTER — Encounter: Payer: Self-pay | Admitting: Cardiovascular Disease

## 2013-10-22 VITALS — BP 120/80 | HR 82 | Ht 65.0 in | Wt 124.1 lb

## 2013-10-22 DIAGNOSIS — I4891 Unspecified atrial fibrillation: Secondary | ICD-10-CM

## 2013-10-22 NOTE — Assessment & Plan Note (Signed)
She is doing very well.  Continue current meds.   i will see her in 6 months.

## 2013-10-22 NOTE — Patient Instructions (Signed)
Your physician wants you to follow-up in: 6 months  You will receive a reminder letter in the mail two months in advance. If you don't receive a letter, please call our office to schedule the follow-up appointment.  Your physician recommends that you continue on your current medications as directed. Please refer to the Current Medication list given to you today.  

## 2013-10-22 NOTE — Progress Notes (Signed)
Hardie Loraana Weatherwax Date of Birth  1919-06-05       [facility]    [place] [facility] [address]  [address] [place], [place]  [address]   [place], [place]  [id number] [phone]     [phone]   Fax  [phone]    Fax [phone]  Problem List: 1. Atrial fibrillation 2. Essential tremor 3. Chronic anticoagulation 4. Hypertension 5. Raynaud's phenomenon  History of Present Illness:  Pt is doing well.  No complaints.  [facility] lives [facility] ([facility]  She has noticed that her HR is very low.  She has not had as much energy.  She denies any syncope.   October 17, 2012:  She is doing well.  She had an episode of more severe palpitations.  The episode lasted for 3 hours and resolved overnight.   Sept. 12., 2014:  Annabelle HarmanDana is doing well.  She is able to do her normal activities.  She [facility].  [facility].  Asymptomatic from an A fib standpoint.    October 22, 2013:  Annabelle HarmanDana is doing ok.  She is having to take care of her husband who has dementia.  No CP , no dyspnea,   No syncope.    Current Outpatient Prescriptions on File Prior to Visit  Medication Sig Dispense Refill  . Calcium Carbonate-Vitamin D (CALTRATE 600+D PO) Take by mouth 2 (two) times daily.        Marland Kitchen. lisinopril (PRINIVIL,ZESTRIL) 10 MG tablet Take 2 tablets by mouth  daily.  60 tablet  6  . Multiple Vitamin (MULTIVITAMIN) tablet Take 1 tablet by mouth daily.        . propranolol (INDERAL) 10 MG tablet Take 1 tablet (10 mg total) by mouth 4 (four) times daily as needed (FOR PALPITATIONS, MAY TAKE ONE EVERY THIRTY MINUTES UP TO 4 DOSES.).  30 tablet  5  . propranolol ER (INDERAL LA) 80 MG 24 hr capsule Take 1 capsule (80 mg total) by mouth daily.  90 capsule  3  . triamterene-hydrochlorothiazide (MAXZIDE-25) 37.5-25 MG per tablet Take 1 tablet by mouth  daily  30 tablet  6  . warfarin (COUMADIN) 5 MG tablet Take as directed  by coumadin clinic  60 tablet  3   No current facility-administered medications on file prior to visit.    No Known Allergies  Past Medical History  Diagnosis Date  . Atrial fibrillation   . Tremor, essential   . Chronic anticoagulation   . Hypertension   . Raynaud phenomenon     Past Surgical History  Procedure Laterality Date  . Cholecystectomy      History  Smoking status  . Never Smoker   Smokeless tobacco  . Not on file    History  Alcohol Use No    Family History  Problem Relation Age of Onset  . Heart disease    . Hypertension    . Stroke    . Cancer      Reviw of Systems:  Reviewed in the HPI.  All other systems are negative.  Physical Exam: Blood pressure 120/80, pulse 82, height 5\' 5"  (1.651 m), weight 124 lb 1.9 oz (56.3 kg). General: Well developed, well nourished, in no acute distress.  Head: Normocephalic, atraumatic, sclera non-icteric, mucus membranes are moist,   Neck: Supple. Carotids are 2 + without bruits. No JVD  Lungs: Clear bilaterally to auscultation.  Heart: irregularly irregular.  normal  S1 S2. No murmurs, gallops or rubs.  Abdomen: Soft, non-tender, non-distended with normal bowel sounds. No hepatomegaly. No rebound/guarding. No masses.  Msk:  Strength and tone are normal  Extremities: No clubbing or cyanosis. No edema.  Distal pedal pulses are 2+ and equal bilaterally.  Neuro: Alert and oriented X 3. Moves all extremities spontaneously.  Psych:  Responds to questions appropriately with a normal affect.  ECG: Sept. 12, 2014:   Atrial fibrillation with her heart rate of 80. She has nonspecific ST and T wave changes.  The ECG is unchanged from previous tracings.  Assessment / Plan:

## 2013-11-11 ENCOUNTER — Ambulatory Visit (INDEPENDENT_AMBULATORY_CARE_PROVIDER_SITE_OTHER): Payer: Medicare Other | Admitting: Interventional Cardiology

## 2013-11-11 DIAGNOSIS — I4891 Unspecified atrial fibrillation: Secondary | ICD-10-CM

## 2013-11-11 DIAGNOSIS — Z7901 Long term (current) use of anticoagulants: Secondary | ICD-10-CM

## 2013-11-11 LAB — POCT INR: INR: 2.1

## 2013-11-28 DIAGNOSIS — R300 Dysuria: Secondary | ICD-10-CM | POA: Diagnosis not present

## 2013-11-28 DIAGNOSIS — R82998 Other abnormal findings in urine: Secondary | ICD-10-CM | POA: Diagnosis not present

## 2013-12-08 ENCOUNTER — Telehealth: Payer: Self-pay | Admitting: Cardiovascular Disease

## 2013-12-08 NOTE — Telephone Encounter (Signed)
New message     Pt is on warafarin.  She needs to have a tooth removed Wednesday.  Should she stop warafarin?

## 2013-12-08 NOTE — Telephone Encounter (Signed)
Follow up  ° ° ° °Returning call back to nurse  °

## 2013-12-08 NOTE — Telephone Encounter (Signed)
Per Cablevision SystemsJeremy Smart - pt does not need to hold coumadin prior to the procedure to have one tooth extracted.  She is aware.

## 2013-12-08 NOTE — Telephone Encounter (Signed)
Lm to call back

## 2013-12-09 ENCOUNTER — Ambulatory Visit (INDEPENDENT_AMBULATORY_CARE_PROVIDER_SITE_OTHER): Payer: Medicare Other | Admitting: Cardiology

## 2013-12-09 DIAGNOSIS — Z7901 Long term (current) use of anticoagulants: Secondary | ICD-10-CM

## 2013-12-09 DIAGNOSIS — I4891 Unspecified atrial fibrillation: Secondary | ICD-10-CM

## 2013-12-09 LAB — POCT INR: INR: 2.6

## 2013-12-17 DIAGNOSIS — H01009 Unspecified blepharitis unspecified eye, unspecified eyelid: Secondary | ICD-10-CM | POA: Diagnosis not present

## 2013-12-17 DIAGNOSIS — H04129 Dry eye syndrome of unspecified lacrimal gland: Secondary | ICD-10-CM | POA: Diagnosis not present

## 2013-12-17 DIAGNOSIS — Z961 Presence of intraocular lens: Secondary | ICD-10-CM | POA: Diagnosis not present

## 2013-12-17 DIAGNOSIS — Z947 Corneal transplant status: Secondary | ICD-10-CM | POA: Diagnosis not present

## 2014-01-06 ENCOUNTER — Ambulatory Visit (INDEPENDENT_AMBULATORY_CARE_PROVIDER_SITE_OTHER): Payer: Medicare Other | Admitting: Cardiology

## 2014-01-06 DIAGNOSIS — Z7901 Long term (current) use of anticoagulants: Secondary | ICD-10-CM

## 2014-01-06 DIAGNOSIS — I4891 Unspecified atrial fibrillation: Secondary | ICD-10-CM

## 2014-01-06 LAB — POCT INR: INR: 2.4

## 2014-01-19 ENCOUNTER — Other Ambulatory Visit: Payer: Self-pay

## 2014-01-19 DIAGNOSIS — Z1231 Encounter for screening mammogram for malignant neoplasm of breast: Secondary | ICD-10-CM

## 2014-02-05 DIAGNOSIS — H6120 Impacted cerumen, unspecified ear: Secondary | ICD-10-CM | POA: Diagnosis not present

## 2014-02-05 DIAGNOSIS — H903 Sensorineural hearing loss, bilateral: Secondary | ICD-10-CM | POA: Diagnosis not present

## 2014-02-17 ENCOUNTER — Ambulatory Visit (INDEPENDENT_AMBULATORY_CARE_PROVIDER_SITE_OTHER): Payer: Medicare Other | Admitting: Cardiovascular Disease

## 2014-02-17 DIAGNOSIS — Z7901 Long term (current) use of anticoagulants: Secondary | ICD-10-CM

## 2014-02-17 LAB — POCT INR: INR: 2.8

## 2014-02-23 ENCOUNTER — Encounter (INDEPENDENT_AMBULATORY_CARE_PROVIDER_SITE_OTHER): Payer: Self-pay

## 2014-02-23 ENCOUNTER — Ambulatory Visit
Admission: RE | Admit: 2014-02-23 | Discharge: 2014-02-23 | Disposition: A | Discharge status: Home / Self Care | Payer: Medicare Other | Source: Ambulatory Visit

## 2014-02-23 DIAGNOSIS — Z1231 Encounter for screening mammogram for malignant neoplasm of breast: Secondary | ICD-10-CM | POA: Diagnosis not present

## 2014-02-26 DIAGNOSIS — H6120 Impacted cerumen, unspecified ear: Secondary | ICD-10-CM | POA: Diagnosis not present

## 2014-02-26 DIAGNOSIS — H903 Sensorineural hearing loss, bilateral: Secondary | ICD-10-CM | POA: Diagnosis not present

## 2014-03-17 ENCOUNTER — Ambulatory Visit (INDEPENDENT_AMBULATORY_CARE_PROVIDER_SITE_OTHER): Payer: Medicare Other | Admitting: Cardiovascular Disease

## 2014-03-17 DIAGNOSIS — Z7901 Long term (current) use of anticoagulants: Secondary | ICD-10-CM

## 2014-03-17 LAB — POCT INR: INR: 2.5

## 2014-03-31 DIAGNOSIS — I10 Essential (primary) hypertension: Secondary | ICD-10-CM | POA: Diagnosis not present

## 2014-03-31 DIAGNOSIS — K921 Melena: Secondary | ICD-10-CM | POA: Diagnosis not present

## 2014-03-31 DIAGNOSIS — K219 Gastro-esophageal reflux disease without esophagitis: Secondary | ICD-10-CM | POA: Diagnosis not present

## 2014-03-31 DIAGNOSIS — Z0000 Encounter for general adult medical examination without abnormal findings: Secondary | ICD-10-CM | POA: Diagnosis not present

## 2014-04-08 DIAGNOSIS — M546 Pain in thoracic spine: Secondary | ICD-10-CM | POA: Diagnosis not present

## 2014-04-08 DIAGNOSIS — I4891 Unspecified atrial fibrillation: Secondary | ICD-10-CM | POA: Diagnosis not present

## 2014-04-08 DIAGNOSIS — Z1331 Encounter for screening for depression: Secondary | ICD-10-CM | POA: Diagnosis not present

## 2014-04-08 DIAGNOSIS — N183 Chronic kidney disease, stage 3 unspecified: Secondary | ICD-10-CM | POA: Diagnosis not present

## 2014-04-08 DIAGNOSIS — I10 Essential (primary) hypertension: Secondary | ICD-10-CM | POA: Diagnosis not present

## 2014-04-08 DIAGNOSIS — Z0000 Encounter for general adult medical examination without abnormal findings: Secondary | ICD-10-CM | POA: Diagnosis not present

## 2014-04-08 DIAGNOSIS — M810 Age-related osteoporosis without current pathological fracture: Secondary | ICD-10-CM | POA: Diagnosis not present

## 2014-04-08 DIAGNOSIS — Z7901 Long term (current) use of anticoagulants: Secondary | ICD-10-CM | POA: Diagnosis not present

## 2014-04-14 ENCOUNTER — Ambulatory Visit (INDEPENDENT_AMBULATORY_CARE_PROVIDER_SITE_OTHER): Payer: Medicare Other | Admitting: Pharmacist Clinician (PhC)/ Clinical Pharmacy Specialist

## 2014-04-14 DIAGNOSIS — Z7901 Long term (current) use of anticoagulants: Secondary | ICD-10-CM

## 2014-04-14 LAB — POCT INR: INR: 2.5

## 2014-04-23 ENCOUNTER — Ambulatory Visit (INDEPENDENT_AMBULATORY_CARE_PROVIDER_SITE_OTHER): Payer: Medicare Other | Admitting: Cardiovascular Disease

## 2014-04-23 ENCOUNTER — Encounter: Payer: Self-pay | Admitting: Cardiovascular Disease

## 2014-04-23 VITALS — BP 134/72 | HR 68 | Ht 65.0 in | Wt 124.6 lb

## 2014-04-23 DIAGNOSIS — I4891 Unspecified atrial fibrillation: Secondary | ICD-10-CM

## 2014-04-23 DIAGNOSIS — I482 Chronic atrial fibrillation, unspecified: Secondary | ICD-10-CM

## 2014-04-23 DIAGNOSIS — I10 Essential (primary) hypertension: Secondary | ICD-10-CM

## 2014-04-23 LAB — BASIC METABOLIC PANEL
BUN: 26 mg/dL — ABNORMAL HIGH (ref 6–23)
CO2: 31 meq/L (ref 19–32)
Calcium: 9.6 mg/dL (ref 8.4–10.5)
Chloride: 96 mEq/L (ref 96–112)
Creatinine, Ser: 1.2 mg/dL (ref 0.4–1.2)
GFR: 45.44 mL/min — ABNORMAL LOW (ref >60.00)
GLUCOSE: 87 mg/dL (ref 70–99)
POTASSIUM: 4.1 meq/L (ref 3.5–5.1)
SODIUM: 134 meq/L — AB (ref 135–145)

## 2014-04-23 MED ORDER — LISINOPRIL 5 MG PO TABS
5.0000 mg | ORAL_TABLET | Freq: Every day | ORAL | Status: DC
Start: 2014-04-23 — End: 2014-10-20

## 2014-04-23 NOTE — Patient Instructions (Signed)
Your physician recommends that you continue on your current medications as directed. Please refer to the Current Medication list given to you today.  Your physician recommends that you have lab work: TODAY  Your physician wants you to follow-up in: 6 months with Dr. Nahser.  You will receive a reminder letter in the mail two months in advance. If you don't receive a letter, please call our office to schedule the follow-up appointment.  

## 2014-04-23 NOTE — Assessment & Plan Note (Signed)
She has chronic Afib.  Well controlled.  On coumadin No symptoms Continue same meds

## 2014-04-23 NOTE — Assessment & Plan Note (Signed)
Her lisinopril dose was decreased by Dr. Jarold Motto. She has continued her maxide. She is worried about her renal fxn. Will recheck her BMP today.

## 2014-04-23 NOTE — Progress Notes (Signed)
Angelica Mejia Date of Birth  11-21-1918       [facility]    [place] [address]  [address] [place], [place]  [address]   [place], [place]  [address] [phone]     [phone]   Fax  [phone]    Fax [phone]  Problem List: 1. Atrial fibrillation 2. Essential tremor 3. Chronic anticoagulation 4. Hypertension 5. Raynaud's phenomenon  History of Present Illness:  Pt is doing well.  No complaints.  She lives at [facility] ([facility]).  She has noticed that her HR is very low.  She has not had as much energy.  She denies any syncope.   October 17, 2012:  She is doing well.  She had an episode of more severe palpitations.  The episode lasted for 3 hours and resolved overnight.   Sept. 12., 2014:  Angelica Mejia is doing well.  She is able to do her normal activities.  She goes to see her husband at [facility].  She is at [facility].  Asymptomatic from an A fib standpoint.    October 22, 2013:  Angelica Mejia is doing ok.  She is having to take care of her husband who has dementia.  No CP , no dyspnea,   No syncope.    Sept. 17, 2015:  Angelica Mejia is doing ok.   No Cp or dyspnea.  Has a problem with her renal function and her medical doctor has decreased her Lisinopril to 5 mg ( despite the list saying 20 mg below)    Current Outpatient Prescriptions on File Prior to Visit  Medication Sig Dispense Refill  . bacitracin ophthalmic ointment       . Calcium Carbonate-Vitamin D (CALTRATE 600+D PO) Take by mouth 2 (two) times daily.        Marland Kitchen lisinopril (PRINIVIL,ZESTRIL) 10 MG tablet Take 2 tablets by mouth  daily.  60 tablet  6  . Multiple Vitamin (MULTIVITAMIN) tablet Take 1 tablet by mouth daily.        . propranolol (INDERAL) 10 MG tablet Take 1 tablet (10 mg total) by mouth 4 (four) times daily as needed (FOR PALPITATIONS, MAY TAKE ONE EVERY THIRTY MINUTES UP TO 4 DOSES.).  30 tablet  5  . propranolol ER (INDERAL LA) 80 MG  24 hr capsule Take 1 capsule (80 mg total) by mouth daily.  90 capsule  3  . triamterene-hydrochlorothiazide (MAXZIDE-25) 37.5-25 MG per tablet Take 1 tablet by mouth  daily  30 tablet  6  . warfarin (COUMADIN) 5 MG tablet Take as directed by coumadin clinic  60 tablet  3   No current facility-administered medications on file prior to visit.    No Known Allergies  Past Medical History  Diagnosis Date  . Atrial fibrillation   . Tremor, essential   . Chronic anticoagulation   . Hypertension   . Raynaud phenomenon     Past Surgical History  Procedure Laterality Date  . Cholecystectomy      History  Smoking status  . Never Smoker   Smokeless tobacco  . Not on file    History  Alcohol Use No    Family History  Problem Relation Age of Onset  . Heart disease    . Hypertension    . Stroke    . Cancer      Reviw of Systems:  Reviewed in the HPI.  All other systems are negative.  Physical Exam:  Blood pressure 134/72, pulse 68, height  (1.651 m), weight 124 lb 9.6 oz (56.518 kg). General: Well developed, well nourished, in no acute distress.  Head: Normocephalic, atraumatic, sclera non-icteric, mucus membranes are moist,   Neck: Supple. Carotids are 2 + without bruits. No JVD  Lungs: Clear bilaterally to auscultation.  Heart: irregularly irregular.  normal  S1 S2. No murmurs, gallops or rubs.  Abdomen: Soft, non-tender, non-distended with normal bowel sounds. No hepatomegaly. No rebound/guarding. No masses.  Msk:  Strength and tone are normal  Extremities: No clubbing or cyanosis. No edema.  Distal pedal pulses are 2+ and equal bilaterally.  Neuro: Alert and oriented X 3. Moves all extremities spontaneously.  Psych:  Responds to questions appropriately with a normal affect.  ECG: Sept. 17, 2015:  Atrial fib with rate of 68.  TWI in lateral leads.  Assessment / Plan:

## 2014-05-11 DIAGNOSIS — Z947 Corneal transplant status: Secondary | ICD-10-CM | POA: Diagnosis not present

## 2014-05-11 DIAGNOSIS — Z961 Presence of intraocular lens: Secondary | ICD-10-CM | POA: Diagnosis not present

## 2014-05-11 DIAGNOSIS — H10502 Unspecified blepharoconjunctivitis, left eye: Secondary | ICD-10-CM | POA: Diagnosis not present

## 2014-05-12 ENCOUNTER — Ambulatory Visit (INDEPENDENT_AMBULATORY_CARE_PROVIDER_SITE_OTHER): Payer: Medicare Other | Admitting: Pharmacist

## 2014-05-12 DIAGNOSIS — Z7901 Long term (current) use of anticoagulants: Secondary | ICD-10-CM

## 2014-05-12 DIAGNOSIS — I482 Chronic atrial fibrillation, unspecified: Secondary | ICD-10-CM

## 2014-05-12 LAB — POCT INR: INR: 1.9

## 2014-05-18 ENCOUNTER — Telehealth: Payer: Self-pay | Admitting: Cardiovascular Disease

## 2014-05-18 NOTE — Telephone Encounter (Signed)
Left message for pt to call back to discuss the episode she had.

## 2014-05-18 NOTE — Telephone Encounter (Signed)
New message     Talk to a nurse to tell her about an "episode" she had.  She thinks it may have been from her bp medication

## 2014-05-18 NOTE — Telephone Encounter (Signed)
Spoke with pt who reports while having lunch on Friday she had an episode where she couldn't walk because if she did she would fall - states she had no control of her movements.  Denies dizziness, chest pain, SOB, H/A, leg weakness etc - just couldn't walk.  States her BP and HR were OK and she [facility].  The symptom has not occurred again.  Advised to have her PCP check her out.  She states he gave her medication for vertigo but says she is not going to fill it because that is not what she has.  Again advised to f/u with PCP.

## 2014-05-21 ENCOUNTER — Telehealth: Payer: Self-pay | Admitting: Cardiovascular Disease

## 2014-05-21 NOTE — Telephone Encounter (Signed)
New message     Pt is having unsteadyness and irr heart beat.  Please advise

## 2014-05-21 NOTE — Telephone Encounter (Signed)
**Note De-Identified  Obfuscation** Per Norma FredricksonLori Gerhardt, NP Tiffany is advised to contact Dr Jarold MottoPatterson, the pts PCP. Tiffany verbalized understanding.

## 2014-05-22 DIAGNOSIS — I482 Chronic atrial fibrillation: Secondary | ICD-10-CM | POA: Diagnosis not present

## 2014-05-22 DIAGNOSIS — I10 Essential (primary) hypertension: Secondary | ICD-10-CM | POA: Diagnosis not present

## 2014-05-22 DIAGNOSIS — R2689 Other abnormalities of gait and mobility: Secondary | ICD-10-CM | POA: Diagnosis not present

## 2014-05-22 DIAGNOSIS — Z6820 Body mass index (BMI) 20.0-20.9, adult: Secondary | ICD-10-CM | POA: Diagnosis not present

## 2014-05-25 DIAGNOSIS — Z23 Encounter for immunization: Secondary | ICD-10-CM | POA: Diagnosis not present

## 2014-05-26 DIAGNOSIS — R2681 Unsteadiness on feet: Secondary | ICD-10-CM | POA: Diagnosis not present

## 2014-05-26 DIAGNOSIS — M6281 Muscle weakness (generalized): Secondary | ICD-10-CM | POA: Diagnosis not present

## 2014-05-28 DIAGNOSIS — R2681 Unsteadiness on feet: Secondary | ICD-10-CM | POA: Diagnosis not present

## 2014-05-28 DIAGNOSIS — M6281 Muscle weakness (generalized): Secondary | ICD-10-CM | POA: Diagnosis not present

## 2014-06-02 DIAGNOSIS — M6281 Muscle weakness (generalized): Secondary | ICD-10-CM | POA: Diagnosis not present

## 2014-06-02 DIAGNOSIS — R2681 Unsteadiness on feet: Secondary | ICD-10-CM | POA: Diagnosis not present

## 2014-06-03 DIAGNOSIS — R2681 Unsteadiness on feet: Secondary | ICD-10-CM | POA: Diagnosis not present

## 2014-06-03 DIAGNOSIS — M6281 Muscle weakness (generalized): Secondary | ICD-10-CM | POA: Diagnosis not present

## 2014-06-05 DIAGNOSIS — M6281 Muscle weakness (generalized): Secondary | ICD-10-CM | POA: Diagnosis not present

## 2014-06-05 DIAGNOSIS — R2681 Unsteadiness on feet: Secondary | ICD-10-CM | POA: Diagnosis not present

## 2014-06-09 ENCOUNTER — Ambulatory Visit (INDEPENDENT_AMBULATORY_CARE_PROVIDER_SITE_OTHER): Payer: Medicare Other | Admitting: Cardiology

## 2014-06-09 DIAGNOSIS — I4891 Unspecified atrial fibrillation: Secondary | ICD-10-CM

## 2014-06-09 DIAGNOSIS — Z7901 Long term (current) use of anticoagulants: Secondary | ICD-10-CM

## 2014-06-09 LAB — POCT INR: INR: 2.6

## 2014-06-10 DIAGNOSIS — M6281 Muscle weakness (generalized): Secondary | ICD-10-CM | POA: Diagnosis not present

## 2014-06-10 DIAGNOSIS — R2681 Unsteadiness on feet: Secondary | ICD-10-CM | POA: Diagnosis not present

## 2014-06-11 DIAGNOSIS — M6281 Muscle weakness (generalized): Secondary | ICD-10-CM | POA: Diagnosis not present

## 2014-06-11 DIAGNOSIS — R2681 Unsteadiness on feet: Secondary | ICD-10-CM | POA: Diagnosis not present

## 2014-06-15 DIAGNOSIS — M6281 Muscle weakness (generalized): Secondary | ICD-10-CM | POA: Diagnosis not present

## 2014-06-15 DIAGNOSIS — R2681 Unsteadiness on feet: Secondary | ICD-10-CM | POA: Diagnosis not present

## 2014-06-17 DIAGNOSIS — M6281 Muscle weakness (generalized): Secondary | ICD-10-CM | POA: Diagnosis not present

## 2014-06-17 DIAGNOSIS — R2681 Unsteadiness on feet: Secondary | ICD-10-CM | POA: Diagnosis not present

## 2014-06-18 DIAGNOSIS — R2681 Unsteadiness on feet: Secondary | ICD-10-CM | POA: Diagnosis not present

## 2014-06-18 DIAGNOSIS — M6281 Muscle weakness (generalized): Secondary | ICD-10-CM | POA: Diagnosis not present

## 2014-06-23 DIAGNOSIS — R2681 Unsteadiness on feet: Secondary | ICD-10-CM | POA: Diagnosis not present

## 2014-06-23 DIAGNOSIS — M6281 Muscle weakness (generalized): Secondary | ICD-10-CM | POA: Diagnosis not present

## 2014-06-25 DIAGNOSIS — M6281 Muscle weakness (generalized): Secondary | ICD-10-CM | POA: Diagnosis not present

## 2014-06-25 DIAGNOSIS — R2681 Unsteadiness on feet: Secondary | ICD-10-CM | POA: Diagnosis not present

## 2014-06-29 DIAGNOSIS — R2681 Unsteadiness on feet: Secondary | ICD-10-CM | POA: Diagnosis not present

## 2014-06-29 DIAGNOSIS — M6281 Muscle weakness (generalized): Secondary | ICD-10-CM | POA: Diagnosis not present

## 2014-07-01 DIAGNOSIS — M6281 Muscle weakness (generalized): Secondary | ICD-10-CM | POA: Diagnosis not present

## 2014-07-01 DIAGNOSIS — R2681 Unsteadiness on feet: Secondary | ICD-10-CM | POA: Diagnosis not present

## 2014-07-06 DIAGNOSIS — R2681 Unsteadiness on feet: Secondary | ICD-10-CM | POA: Diagnosis not present

## 2014-07-06 DIAGNOSIS — M6281 Muscle weakness (generalized): Secondary | ICD-10-CM | POA: Diagnosis not present

## 2014-07-07 ENCOUNTER — Ambulatory Visit (INDEPENDENT_AMBULATORY_CARE_PROVIDER_SITE_OTHER): Payer: Medicare Other | Admitting: Cardiovascular Disease

## 2014-07-07 DIAGNOSIS — Z7901 Long term (current) use of anticoagulants: Secondary | ICD-10-CM

## 2014-07-07 DIAGNOSIS — M6281 Muscle weakness (generalized): Secondary | ICD-10-CM | POA: Diagnosis not present

## 2014-07-07 DIAGNOSIS — R2681 Unsteadiness on feet: Secondary | ICD-10-CM | POA: Diagnosis not present

## 2014-07-07 DIAGNOSIS — I4891 Unspecified atrial fibrillation: Secondary | ICD-10-CM

## 2014-07-07 LAB — POCT INR: INR: 2.2

## 2014-08-04 ENCOUNTER — Ambulatory Visit (INDEPENDENT_AMBULATORY_CARE_PROVIDER_SITE_OTHER): Payer: Medicare Other | Admitting: Cardiology

## 2014-08-04 DIAGNOSIS — I4891 Unspecified atrial fibrillation: Secondary | ICD-10-CM

## 2014-08-04 DIAGNOSIS — Z7901 Long term (current) use of anticoagulants: Secondary | ICD-10-CM

## 2014-08-04 DIAGNOSIS — M859 Disorder of bone density and structure, unspecified: Secondary | ICD-10-CM | POA: Diagnosis not present

## 2014-08-04 LAB — POCT INR: INR: 2.8

## 2014-08-18 ENCOUNTER — Other Ambulatory Visit: Payer: Self-pay | Admitting: Cardiovascular Disease

## 2014-08-27 DIAGNOSIS — Z6820 Body mass index (BMI) 20.0-20.9, adult: Secondary | ICD-10-CM | POA: Diagnosis not present

## 2014-08-27 DIAGNOSIS — M810 Age-related osteoporosis without current pathological fracture: Secondary | ICD-10-CM | POA: Diagnosis not present

## 2014-08-27 DIAGNOSIS — K219 Gastro-esophageal reflux disease without esophagitis: Secondary | ICD-10-CM | POA: Diagnosis not present

## 2014-08-27 DIAGNOSIS — I482 Chronic atrial fibrillation: Secondary | ICD-10-CM | POA: Diagnosis not present

## 2014-08-27 DIAGNOSIS — R2689 Other abnormalities of gait and mobility: Secondary | ICD-10-CM | POA: Diagnosis not present

## 2014-09-01 ENCOUNTER — Ambulatory Visit (INDEPENDENT_AMBULATORY_CARE_PROVIDER_SITE_OTHER): Payer: Medicare Other | Admitting: Cardiovascular Disease

## 2014-09-01 DIAGNOSIS — I4891 Unspecified atrial fibrillation: Secondary | ICD-10-CM

## 2014-09-01 DIAGNOSIS — Z7901 Long term (current) use of anticoagulants: Secondary | ICD-10-CM

## 2014-09-01 LAB — POCT INR: INR: 2.4

## 2014-09-17 DIAGNOSIS — H10502 Unspecified blepharoconjunctivitis, left eye: Secondary | ICD-10-CM | POA: Diagnosis not present

## 2014-09-30 ENCOUNTER — Ambulatory Visit (INDEPENDENT_AMBULATORY_CARE_PROVIDER_SITE_OTHER): Payer: Medicare Other | Admitting: Cardiology

## 2014-09-30 DIAGNOSIS — I4891 Unspecified atrial fibrillation: Secondary | ICD-10-CM

## 2014-09-30 DIAGNOSIS — Z7901 Long term (current) use of anticoagulants: Secondary | ICD-10-CM

## 2014-09-30 LAB — POCT INR: INR: 2.7

## 2014-10-20 ENCOUNTER — Ambulatory Visit (INDEPENDENT_AMBULATORY_CARE_PROVIDER_SITE_OTHER): Payer: Medicare Other | Admitting: Cardiovascular Disease

## 2014-10-20 ENCOUNTER — Encounter: Payer: Self-pay | Admitting: Cardiovascular Disease

## 2014-10-20 VITALS — BP 140/72 | HR 59 | Ht 65.0 in | Wt 126.0 lb

## 2014-10-20 DIAGNOSIS — I482 Chronic atrial fibrillation, unspecified: Secondary | ICD-10-CM

## 2014-10-20 DIAGNOSIS — Z7901 Long term (current) use of anticoagulants: Secondary | ICD-10-CM

## 2014-10-20 DIAGNOSIS — I10 Essential (primary) hypertension: Secondary | ICD-10-CM

## 2014-10-20 MED ORDER — LISINOPRIL 10 MG PO TABS: 10.0000 mg | ORAL_TABLET | Freq: Every day | ORAL | Status: DC

## 2014-10-20 NOTE — Patient Instructions (Signed)
Your physician has recommended you make the following change in your medication:  INCREASE Lisinopril to 10 mg daily  Your physician recommends that you return for lab work in: 3 weeks for CBC, Basic metabolic panel  Your physician wants you to follow-up in: 6 months with Dr. Elease HashimotoNahser.  You will receive a reminder letter in the mail two months in advance. If you don't receive a letter, please call our office to schedule the follow-up appointment.

## 2014-10-20 NOTE — Progress Notes (Signed)
Cardiology Office Note   Date:  10/20/2014   ID:  Angelica Mejia, DOB [birth year], MRN [id number]  PCP:  Garlan FillersPATERSON,DANIEL G, MD  Cardiologist:   Vesta MixerNahser, Alphonsus Doyel J, MD   Chief Complaint  Patient presents with  . Follow-up    atrial fib   1. Atrial fibrillation 2. Essential tremor 3. Chronic anticoagulation 4. Hypertension 5. Raynaud's phenomenon  History of Present Illness:  Pt is doing well. No complaints. [facility] ([facility] She has noticed that her HR is very low. She has not had as much energy. She denies any syncope.   October 17, 2012:  She is doing well. She had an episode of more severe palpitations. The episode lasted for 3 hours and resolved overnight.   Sept. 12., 2014:  Angelica Mejia is doing well. She is able to do her normal activities. She [facility]. [facility].  Asymptomatic from an A fib standpoint.   October 22, 2013:  Angelica Mejia is doing ok. She is having to take care of her husband who has dementia. No CP , no dyspnea,  No syncope.   Sept. 17, 2015:  Angelica Mejia is doing ok. No Cp or dyspnea.  Has a problem with her renal function and her medical doctor has decreased her Lisinopril to 5 mg ( despite the list saying 20 mg below)    History of Present Illness: Angelica Mejia is a 81 y.o. female who presents for follow up of her atrial fib and HTN.  She is doing ok Commented that she has occasional small amount of blood in her stool.  Has not had a CBC drawn here in 2 years.    Past Medical History  Diagnosis Date  . Atrial fibrillation   . Tremor, essential   . Chronic anticoagulation   . Hypertension   . Raynaud phenomenon     Past Surgical History  Procedure Laterality Date  . Cholecystectomy       Current Outpatient Prescriptions  Medication Sig Dispense Refill  . Calcium Carbonate-Vitamin D (CALTRATE 600+D PO) Take by mouth 2 (two) times daily.      Marland Kitchen. lisinopril  (PRINIVIL,ZESTRIL) 5 MG tablet Take 1 tablet (5 mg total) by mouth daily. 90 tablet 3  . propranolol (INDERAL) 10 MG tablet Take 1 tablet (10 mg total) by mouth 4 (four) times daily as needed (FOR PALPITATIONS, MAY TAKE ONE EVERY THIRTY MINUTES UP TO 4 DOSES.). 30 tablet 5  . tobramycin-dexamethasone (TOBRADEX) ophthalmic solution Place into both eyes daily.    Marland Kitchen. triamterene-hydrochlorothiazide (MAXZIDE-25) 37.5-25 MG per tablet Take 1 tablet by mouth  daily 30 tablet 5  . warfarin (COUMADIN) 5 MG tablet Take as directed by  coumadin clinic 60 tablet 2   No current facility-administered medications for this visit.    Allergies:   Review of patient's allergies indicates no known allergies.    Social History:  The patient  reports that she has never smoked. She does not have any smokeless tobacco history on file. She reports that she does not drink alcohol or use illicit drugs.   Family History:  The patient's family history includes Cancer in an other family member; Heart attack in her father; Heart disease in an other family member; Hypertension in her father and another family member; Stroke in an other family member.    ROS:  Please see the history of present illness.    Review of Systems: Constitutional:  denies  fever, chills, diaphoresis, appetite change and fatigue.  HEENT: denies photophobia, eye pain, redness, hearing loss, ear pain, congestion, sore throat, rhinorrhea, sneezing, neck pain, neck stiffness and tinnitus.  Respiratory: denies SOB, DOE, cough, chest tightness, and wheezing.  Cardiovascular: denies chest pain, palpitations and leg swelling.  Gastrointestinal: denies nausea, vomiting, abdominal pain, diarrhea, constipation, blood in stool.  Genitourinary: denies dysuria, urgency, frequency, hematuria, flank pain and difficulty urinating.  Musculoskeletal: denies  myalgias, back pain, joint swelling, arthralgias and gait problem.   Skin: denies pallor, rash and wound.    Neurological: denies dizziness, seizures, syncope, weakness, light-headedness, numbness and headaches.   Hematological: denies adenopathy, easy bruising, personal or family bleeding history.  Psychiatric/ Behavioral: denies suicidal ideation, mood changes, confusion, nervousness, sleep disturbance and agitation.       All other systems are reviewed and negative.    PHYSICAL EXAM: VS:  BP 140/72 mmHg  Pulse 59  Ht  (1.651 m)  Wt 126 lb (57.153 kg)  BMI 20.97 kg/m2  SpO2 97% , BMI Body mass index is 20.97 kg/(m^2). GEN: Well nourished, well developed, in no acute distress HEENT: normal Neck: no JVD, carotid bruits, or masses Cardiac: Irreg. Irreg. ; no murmurs, rubs, or gallops,no edema  Respiratory:  clear to auscultation bilaterally, normal work of breathing GI: soft, nontender, nondistended, + BS MS: no deformity or atrophy Skin: warm and dry, no rash Neuro:  Strength and sensation are intact Psych: normal   EKG:  EKG is not ordered today.    Recent Labs: 04/23/2014: BUN 26*; Creatinine 1.2; Potassium 4.1; Sodium 134*    Lipid Panel No results found for: CHOL, TRIG, HDL, CHOLHDL, VLDL, LDLCALC, LDLDIRECT    Wt Readings from Last 3 Encounters:  10/20/14 126 lb (57.153 kg)  04/23/14 124 lb 9.6 oz (56.518 kg)  10/22/13 124 lb 1.9 oz (56.3 kg)      Other studies Reviewed: Additional studies/ records that were reviewed today include: . Review of the above records demonstrates:    ASSESSMENT AND PLAN:  1. Atrial fibrillation - she appears to be very stable. Heart rate is well-controlled.  2. Essential tremor  3. Chronic anticoagulation - her INR levels have been therapeutic. She commented that she has a small amount of blood in her stool on very rare occasion. We will check a CBC when I check her blood in several weeks.  4. Hypertension - her blood pressure is a little elevated today. We'll increase her lisinopril to 10 mg a day. We'll check a basic  medical profile in 3 weeks.  5. Raynaud's phenomenon   Current medicines are reviewed at length with the patient today.  The patient does not have concerns regarding medicines.  The following changes have been made:  See above.   Labs/ tests ordered today include:  No orders of the defined types were placed in this encounter.     Disposition:   FU with me in 6 months.     Signed, Karema Tocci, Deloris Ping, MD  10/20/2014 10:56 AM    [facility] [address] [place], [place], [place]  [address] Phone: [phone]; Fax: [phone]

## 2014-11-03 ENCOUNTER — Ambulatory Visit (INDEPENDENT_AMBULATORY_CARE_PROVIDER_SITE_OTHER): Payer: Medicare Other | Admitting: Pharmacist

## 2014-11-03 DIAGNOSIS — Z7901 Long term (current) use of anticoagulants: Secondary | ICD-10-CM

## 2014-11-03 DIAGNOSIS — I4891 Unspecified atrial fibrillation: Secondary | ICD-10-CM

## 2014-11-03 LAB — POCT INR: INR: 2.7

## 2014-11-04 DIAGNOSIS — Z947 Corneal transplant status: Secondary | ICD-10-CM | POA: Diagnosis not present

## 2014-11-04 DIAGNOSIS — H10502 Unspecified blepharoconjunctivitis, left eye: Secondary | ICD-10-CM | POA: Diagnosis not present

## 2014-11-10 ENCOUNTER — Other Ambulatory Visit (INDEPENDENT_AMBULATORY_CARE_PROVIDER_SITE_OTHER): Payer: Medicare Other | Admitting: *Deleted

## 2014-11-10 DIAGNOSIS — Z7901 Long term (current) use of anticoagulants: Secondary | ICD-10-CM | POA: Diagnosis not present

## 2014-11-10 DIAGNOSIS — I482 Chronic atrial fibrillation, unspecified: Secondary | ICD-10-CM

## 2014-11-10 LAB — CBC WITH DIFFERENTIAL/PLATELET
Basophils Absolute: 0 10*3/uL (ref 0.0–0.1)
Basophils Relative: 0.6 % (ref 0.0–3.0)
EOS ABS: 0.1 10*3/uL (ref 0.0–0.7)
Eosinophils Relative: 1.8 % (ref 0.0–5.0)
HCT: 39.7 % (ref 36.0–46.0)
Hemoglobin: 13.4 g/dL (ref 12.0–15.0)
LYMPHS PCT: 23 % (ref 12.0–46.0)
Lymphs Abs: 1.7 10*3/uL (ref 0.7–4.0)
MCHC: 33.7 g/dL (ref 30.0–36.0)
MCV: 89.7 fl (ref 78.0–100.0)
MONOS PCT: 12.4 % — AB (ref 3.0–12.0)
Monocytes Absolute: 0.9 10*3/uL (ref 0.1–1.0)
Neutro Abs: 4.6 10*3/uL (ref 1.4–7.7)
Neutrophils Relative %: 62.2 % (ref 43.0–77.0)
PLATELETS: 216 10*3/uL (ref 150.0–400.0)
RBC: 4.43 Mil/uL (ref 3.87–5.11)
RDW: 13.6 % (ref 11.5–15.5)
WBC: 7.3 10*3/uL (ref 4.0–10.5)

## 2014-11-10 LAB — BASIC METABOLIC PANEL
BUN: 27 mg/dL — AB (ref 6–23)
CALCIUM: 10 mg/dL (ref 8.4–10.5)
CO2: 35 meq/L — AB (ref 19–32)
CREATININE: 1.15 mg/dL (ref 0.40–1.20)
Chloride: 95 mEq/L — ABNORMAL LOW (ref 96–112)
GFR: 46.76 mL/min — ABNORMAL LOW (ref >60.00)
Glucose, Bld: 80 mg/dL (ref 70–99)
Potassium: 4.3 mEq/L (ref 3.5–5.1)
Sodium: 133 mEq/L — ABNORMAL LOW (ref 135–145)

## 2014-11-10 NOTE — Addendum Note (Signed)
Addended by: Tonita PhoenixBOWDEN, Eh Sesay K on: 11/10/2014 09:57 AM   Modules accepted: Orders

## 2014-11-23 DIAGNOSIS — Z947 Corneal transplant status: Secondary | ICD-10-CM | POA: Diagnosis not present

## 2014-11-23 DIAGNOSIS — H16102 Unspecified superficial keratitis, left eye: Secondary | ICD-10-CM | POA: Diagnosis not present

## 2014-11-23 DIAGNOSIS — T1502XA Foreign body in cornea, left eye, initial encounter: Secondary | ICD-10-CM | POA: Diagnosis not present

## 2014-12-03 ENCOUNTER — Telehealth: Payer: Self-pay | Admitting: Cardiovascular Disease

## 2014-12-03 DIAGNOSIS — H6123 Impacted cerumen, bilateral: Secondary | ICD-10-CM | POA: Diagnosis not present

## 2014-12-03 DIAGNOSIS — H903 Sensorineural hearing loss, bilateral: Secondary | ICD-10-CM | POA: Diagnosis not present

## 2014-12-03 NOTE — Telephone Encounter (Signed)
Walk in pt form- pt calling regarding BP meds. Angelica Mejia back Friday 4.29 will give to her then

## 2014-12-15 ENCOUNTER — Ambulatory Visit (INDEPENDENT_AMBULATORY_CARE_PROVIDER_SITE_OTHER): Payer: Medicare Other | Admitting: Cardiovascular Disease

## 2014-12-15 DIAGNOSIS — Z7901 Long term (current) use of anticoagulants: Secondary | ICD-10-CM

## 2014-12-15 DIAGNOSIS — I4891 Unspecified atrial fibrillation: Secondary | ICD-10-CM

## 2014-12-15 LAB — POCT INR: INR: 2.4

## 2015-01-19 ENCOUNTER — Other Ambulatory Visit: Payer: Self-pay

## 2015-01-19 DIAGNOSIS — Z1231 Encounter for screening mammogram for malignant neoplasm of breast: Secondary | ICD-10-CM

## 2015-01-26 ENCOUNTER — Ambulatory Visit (INDEPENDENT_AMBULATORY_CARE_PROVIDER_SITE_OTHER): Payer: Medicare Other | Admitting: Cardiovascular Disease

## 2015-01-26 DIAGNOSIS — Z7901 Long term (current) use of anticoagulants: Secondary | ICD-10-CM

## 2015-01-26 DIAGNOSIS — I4891 Unspecified atrial fibrillation: Secondary | ICD-10-CM

## 2015-01-26 LAB — POCT INR: INR: 2.1

## 2015-02-03 DIAGNOSIS — R29818 Other symptoms and signs involving the nervous system: Secondary | ICD-10-CM | POA: Diagnosis not present

## 2015-02-03 DIAGNOSIS — R2681 Unsteadiness on feet: Secondary | ICD-10-CM | POA: Diagnosis not present

## 2015-02-09 DIAGNOSIS — M6281 Muscle weakness (generalized): Secondary | ICD-10-CM | POA: Diagnosis not present

## 2015-02-09 DIAGNOSIS — R2681 Unsteadiness on feet: Secondary | ICD-10-CM | POA: Diagnosis not present

## 2015-02-15 ENCOUNTER — Other Ambulatory Visit: Payer: Self-pay | Admitting: Cardiovascular Disease

## 2015-02-16 DIAGNOSIS — R102 Pelvic and perineal pain: Secondary | ICD-10-CM | POA: Diagnosis not present

## 2015-02-16 DIAGNOSIS — Z6820 Body mass index (BMI) 20.0-20.9, adult: Secondary | ICD-10-CM | POA: Diagnosis not present

## 2015-02-16 DIAGNOSIS — M810 Age-related osteoporosis without current pathological fracture: Secondary | ICD-10-CM | POA: Diagnosis not present

## 2015-02-16 DIAGNOSIS — S300XXA Contusion of lower back and pelvis, initial encounter: Secondary | ICD-10-CM | POA: Diagnosis not present

## 2015-02-19 DIAGNOSIS — M6281 Muscle weakness (generalized): Secondary | ICD-10-CM | POA: Diagnosis not present

## 2015-02-19 DIAGNOSIS — R2681 Unsteadiness on feet: Secondary | ICD-10-CM | POA: Diagnosis not present

## 2015-02-25 ENCOUNTER — Ambulatory Visit
Admission: RE | Admit: 2015-02-25 | Discharge: 2015-02-25 | Disposition: A | Discharge status: Home / Self Care | Payer: Medicare Other | Source: Ambulatory Visit

## 2015-02-25 DIAGNOSIS — Z1231 Encounter for screening mammogram for malignant neoplasm of breast: Secondary | ICD-10-CM | POA: Diagnosis not present

## 2015-03-02 DIAGNOSIS — M6281 Muscle weakness (generalized): Secondary | ICD-10-CM | POA: Diagnosis not present

## 2015-03-02 DIAGNOSIS — R2681 Unsteadiness on feet: Secondary | ICD-10-CM | POA: Diagnosis not present

## 2015-03-03 DIAGNOSIS — M6281 Muscle weakness (generalized): Secondary | ICD-10-CM | POA: Diagnosis not present

## 2015-03-03 DIAGNOSIS — R2681 Unsteadiness on feet: Secondary | ICD-10-CM | POA: Diagnosis not present

## 2015-03-09 ENCOUNTER — Ambulatory Visit (INDEPENDENT_AMBULATORY_CARE_PROVIDER_SITE_OTHER): Payer: Medicare Other | Admitting: Interventional Cardiology

## 2015-03-09 DIAGNOSIS — I4891 Unspecified atrial fibrillation: Secondary | ICD-10-CM

## 2015-03-09 DIAGNOSIS — M6281 Muscle weakness (generalized): Secondary | ICD-10-CM | POA: Diagnosis not present

## 2015-03-09 DIAGNOSIS — R2681 Unsteadiness on feet: Secondary | ICD-10-CM | POA: Diagnosis not present

## 2015-03-09 DIAGNOSIS — R32 Unspecified urinary incontinence: Secondary | ICD-10-CM | POA: Diagnosis not present

## 2015-03-09 DIAGNOSIS — R29818 Other symptoms and signs involving the nervous system: Secondary | ICD-10-CM | POA: Diagnosis not present

## 2015-03-09 DIAGNOSIS — Z7901 Long term (current) use of anticoagulants: Secondary | ICD-10-CM

## 2015-03-09 LAB — POCT INR: INR: 3.2

## 2015-03-12 DIAGNOSIS — M810 Age-related osteoporosis without current pathological fracture: Secondary | ICD-10-CM | POA: Diagnosis not present

## 2015-03-12 DIAGNOSIS — Z6820 Body mass index (BMI) 20.0-20.9, adult: Secondary | ICD-10-CM | POA: Diagnosis not present

## 2015-03-12 DIAGNOSIS — I10 Essential (primary) hypertension: Secondary | ICD-10-CM | POA: Diagnosis not present

## 2015-03-12 DIAGNOSIS — I482 Chronic atrial fibrillation: Secondary | ICD-10-CM | POA: Diagnosis not present

## 2015-03-16 DIAGNOSIS — I279 Pulmonary heart disease, unspecified: Secondary | ICD-10-CM | POA: Diagnosis not present

## 2015-03-16 DIAGNOSIS — M6281 Muscle weakness (generalized): Secondary | ICD-10-CM | POA: Diagnosis not present

## 2015-03-16 DIAGNOSIS — R29818 Other symptoms and signs involving the nervous system: Secondary | ICD-10-CM | POA: Diagnosis not present

## 2015-03-16 DIAGNOSIS — R2681 Unsteadiness on feet: Secondary | ICD-10-CM | POA: Diagnosis not present

## 2015-03-16 DIAGNOSIS — R5382 Chronic fatigue, unspecified: Secondary | ICD-10-CM | POA: Diagnosis not present

## 2015-03-16 DIAGNOSIS — I10 Essential (primary) hypertension: Secondary | ICD-10-CM | POA: Diagnosis not present

## 2015-03-16 DIAGNOSIS — R32 Unspecified urinary incontinence: Secondary | ICD-10-CM | POA: Diagnosis not present

## 2015-03-19 DIAGNOSIS — M6281 Muscle weakness (generalized): Secondary | ICD-10-CM | POA: Diagnosis not present

## 2015-03-19 DIAGNOSIS — R2681 Unsteadiness on feet: Secondary | ICD-10-CM | POA: Diagnosis not present

## 2015-03-19 DIAGNOSIS — R29818 Other symptoms and signs involving the nervous system: Secondary | ICD-10-CM | POA: Diagnosis not present

## 2015-03-19 DIAGNOSIS — R32 Unspecified urinary incontinence: Secondary | ICD-10-CM | POA: Diagnosis not present

## 2015-03-22 DIAGNOSIS — R32 Unspecified urinary incontinence: Secondary | ICD-10-CM | POA: Diagnosis not present

## 2015-03-22 DIAGNOSIS — R2681 Unsteadiness on feet: Secondary | ICD-10-CM | POA: Diagnosis not present

## 2015-03-22 DIAGNOSIS — M6281 Muscle weakness (generalized): Secondary | ICD-10-CM | POA: Diagnosis not present

## 2015-03-22 DIAGNOSIS — R29818 Other symptoms and signs involving the nervous system: Secondary | ICD-10-CM | POA: Diagnosis not present

## 2015-03-23 ENCOUNTER — Ambulatory Visit (INDEPENDENT_AMBULATORY_CARE_PROVIDER_SITE_OTHER): Payer: Medicare Other | Admitting: Cardiology

## 2015-03-23 DIAGNOSIS — Z7901 Long term (current) use of anticoagulants: Secondary | ICD-10-CM

## 2015-03-23 DIAGNOSIS — I4891 Unspecified atrial fibrillation: Secondary | ICD-10-CM

## 2015-03-23 LAB — POCT INR: INR: 2.8

## 2015-03-24 DIAGNOSIS — R32 Unspecified urinary incontinence: Secondary | ICD-10-CM | POA: Diagnosis not present

## 2015-03-24 DIAGNOSIS — R2681 Unsteadiness on feet: Secondary | ICD-10-CM | POA: Diagnosis not present

## 2015-03-24 DIAGNOSIS — M6281 Muscle weakness (generalized): Secondary | ICD-10-CM | POA: Diagnosis not present

## 2015-03-24 DIAGNOSIS — R29818 Other symptoms and signs involving the nervous system: Secondary | ICD-10-CM | POA: Diagnosis not present

## 2015-03-29 DIAGNOSIS — M6281 Muscle weakness (generalized): Secondary | ICD-10-CM | POA: Diagnosis not present

## 2015-03-29 DIAGNOSIS — R29818 Other symptoms and signs involving the nervous system: Secondary | ICD-10-CM | POA: Diagnosis not present

## 2015-03-29 DIAGNOSIS — R32 Unspecified urinary incontinence: Secondary | ICD-10-CM | POA: Diagnosis not present

## 2015-03-29 DIAGNOSIS — R2681 Unsteadiness on feet: Secondary | ICD-10-CM | POA: Diagnosis not present

## 2015-03-30 DIAGNOSIS — R29818 Other symptoms and signs involving the nervous system: Secondary | ICD-10-CM | POA: Diagnosis not present

## 2015-03-30 DIAGNOSIS — R32 Unspecified urinary incontinence: Secondary | ICD-10-CM | POA: Diagnosis not present

## 2015-03-30 DIAGNOSIS — M6281 Muscle weakness (generalized): Secondary | ICD-10-CM | POA: Diagnosis not present

## 2015-03-30 DIAGNOSIS — R2681 Unsteadiness on feet: Secondary | ICD-10-CM | POA: Diagnosis not present

## 2015-04-13 ENCOUNTER — Ambulatory Visit (INDEPENDENT_AMBULATORY_CARE_PROVIDER_SITE_OTHER): Payer: Medicare Other | Admitting: Cardiovascular Disease

## 2015-04-13 DIAGNOSIS — I4891 Unspecified atrial fibrillation: Secondary | ICD-10-CM

## 2015-04-13 DIAGNOSIS — Z7901 Long term (current) use of anticoagulants: Secondary | ICD-10-CM

## 2015-04-13 LAB — POCT INR: INR: 2.6

## 2015-04-15 DIAGNOSIS — K219 Gastro-esophageal reflux disease without esophagitis: Secondary | ICD-10-CM | POA: Diagnosis not present

## 2015-04-15 DIAGNOSIS — K921 Melena: Secondary | ICD-10-CM | POA: Diagnosis not present

## 2015-04-15 DIAGNOSIS — I10 Essential (primary) hypertension: Secondary | ICD-10-CM | POA: Diagnosis not present

## 2015-04-15 DIAGNOSIS — Z0000 Encounter for general adult medical examination without abnormal findings: Secondary | ICD-10-CM | POA: Diagnosis not present

## 2015-04-26 DIAGNOSIS — H10502 Unspecified blepharoconjunctivitis, left eye: Secondary | ICD-10-CM | POA: Diagnosis not present

## 2015-04-26 DIAGNOSIS — Z947 Corneal transplant status: Secondary | ICD-10-CM | POA: Diagnosis not present

## 2015-05-05 DIAGNOSIS — Z0000 Encounter for general adult medical examination without abnormal findings: Secondary | ICD-10-CM | POA: Diagnosis not present

## 2015-05-05 DIAGNOSIS — I482 Chronic atrial fibrillation: Secondary | ICD-10-CM | POA: Diagnosis not present

## 2015-05-05 DIAGNOSIS — I10 Essential (primary) hypertension: Secondary | ICD-10-CM | POA: Diagnosis not present

## 2015-05-05 DIAGNOSIS — Z7901 Long term (current) use of anticoagulants: Secondary | ICD-10-CM | POA: Diagnosis not present

## 2015-05-05 DIAGNOSIS — N183 Chronic kidney disease, stage 3 (moderate): Secondary | ICD-10-CM | POA: Diagnosis not present

## 2015-05-05 DIAGNOSIS — Z6820 Body mass index (BMI) 20.0-20.9, adult: Secondary | ICD-10-CM | POA: Diagnosis not present

## 2015-05-05 DIAGNOSIS — R2689 Other abnormalities of gait and mobility: Secondary | ICD-10-CM | POA: Diagnosis not present

## 2015-05-05 DIAGNOSIS — M810 Age-related osteoporosis without current pathological fracture: Secondary | ICD-10-CM | POA: Diagnosis not present

## 2015-05-07 DIAGNOSIS — Z23 Encounter for immunization: Secondary | ICD-10-CM | POA: Diagnosis not present

## 2015-05-11 ENCOUNTER — Ambulatory Visit (INDEPENDENT_AMBULATORY_CARE_PROVIDER_SITE_OTHER): Payer: Medicare Other | Admitting: Cardiovascular Disease

## 2015-05-11 ENCOUNTER — Encounter: Payer: Self-pay | Admitting: Cardiovascular Disease

## 2015-05-11 VITALS — BP 136/70 | HR 70 | Ht 65.0 in | Wt 128.4 lb

## 2015-05-11 DIAGNOSIS — I482 Chronic atrial fibrillation, unspecified: Secondary | ICD-10-CM

## 2015-05-11 DIAGNOSIS — I10 Essential (primary) hypertension: Secondary | ICD-10-CM

## 2015-05-11 DIAGNOSIS — Z7901 Long term (current) use of anticoagulants: Secondary | ICD-10-CM

## 2015-05-11 LAB — POCT INR: INR: 2.3

## 2015-05-11 NOTE — Progress Notes (Signed)
Cardiology Office Note   Date:  05/11/2015   ID:  Angelica Mejia, DOB 05/23/1919, MRN [id number]  PCP:  Garlan Fillers, MD  Cardiologist:   Vesta Mixer, MD   Chief Complaint  Patient presents with  . Follow-up    atrial fib   1. Atrial fibrillation 2. Essential tremor 3. Chronic anticoagulation 4. Hypertension 5. Raynaud's phenomenon  History of Present Illness:  Pt is doing well. No complaints. She lives at [facility] ([facility] She has noticed that her HR is very low. She has not had as much energy. She denies any syncope.   October 17, 2012:  She is doing well. She had an episode of more severe palpitations. The episode lasted for 3 hours and resolved overnight.   Sept. 12., 2014:  Angelica Mejia is doing well. She is able to do her normal activities. She goes to see her husband at [facility]. She is at [facility].  Asymptomatic from an A fib standpoint.   October 22, 2013:  Angelica Mejia is doing ok. She is having to take care of her husband who has dementia. No CP , no dyspnea,  No syncope.   Sept. 17, 2015:  Angelica Mejia is doing ok. No Cp or dyspnea.  Has a problem with her renal function and her medical doctor has decreased her Lisinopril to 5 mg ( despite the list saying 20 mg below)    History of Present Illness: Angelica Mejia is a 81 y.o. female who presents for follow up of her atrial fib and HTN.  She is doing ok Commented that she has occasional small amount of blood in her stool.  Has not had a CBC drawn here in 2 years.   Oct. 4, 2016:   Angelica Mejia is seen back today for follow up of her atrial fib and HTN. Has mild bruising . No CP or dyspnea.  Has plantar fasciatis.   Past Medical History  Diagnosis Date  . Atrial fibrillation (HCC)   . Tremor, essential   . Chronic anticoagulation   . Hypertension   . Raynaud phenomenon     Past Surgical History  Procedure Laterality Date  . Cholecystectomy       Current  Outpatient Prescriptions  Medication Sig Dispense Refill  . Calcium Carbonate-Vitamin D (CALTRATE 600+D PO) Take by mouth 2 (two) times daily.      . furosemide (LASIX) 40 MG tablet Take 40 mg by mouth daily.    Marland Kitchen lisinopril (PRINIVIL,ZESTRIL) 10 MG tablet Take 1 tablet (10 mg total) by mouth daily. 90 tablet 3  . propranolol (INDERAL) 10 MG tablet Take 1 tablet (10 mg total) by mouth 4 (four) times daily as needed (FOR PALPITATIONS, MAY TAKE ONE EVERY THIRTY MINUTES UP TO 4 DOSES.). 30 tablet 5  . tobramycin-dexamethasone (TOBRADEX) ophthalmic solution Place 1 drop into both eyes daily.     Marland Kitchen warfarin (COUMADIN) 5 MG tablet TAKE AS DIRECTED BY  COUMADIN CLINIC 60 tablet 1   No current facility-administered medications for this visit.    Allergies:   Review of patient's allergies indicates no known allergies.    Social History:  The patient  reports that she has never smoked. She does not have any smokeless tobacco history on file. She reports that she does not drink alcohol or use illicit drugs.   Family History:  The patient's family history includes Cancer in an other family member; Heart attack in her father; Heart disease in an other family member; Hypertension in  her father and another family member; Stroke in an other family member.    ROS:  Please see the history of present illness.    Review of Systems: Constitutional:  denies fever, chills, diaphoresis, appetite change and fatigue.  HEENT: denies photophobia, eye pain, redness, hearing loss, ear pain, congestion, sore throat, rhinorrhea, sneezing, neck pain, neck stiffness and tinnitus.  Respiratory: denies SOB, DOE, cough, chest tightness, and wheezing.  Cardiovascular: denies chest pain, palpitations and leg swelling.  Gastrointestinal: denies nausea, vomiting, abdominal pain, diarrhea, constipation, blood in stool.  Genitourinary: denies dysuria, urgency, frequency, hematuria, flank pain and difficulty urinating.    Musculoskeletal: denies  myalgias, back pain, joint swelling, arthralgias and gait problem.   Skin: denies pallor, rash and wound.  Neurological: denies dizziness, seizures, syncope, weakness, light-headedness, numbness and headaches.   Hematological: denies adenopathy, easy bruising, personal or family bleeding history.  Psychiatric/ Behavioral: denies suicidal ideation, mood changes, confusion, nervousness, sleep disturbance and agitation.       All other systems are reviewed and negative.    PHYSICAL EXAM: VS:  BP 136/70 mmHg  Pulse 70  Ht  (1.651 m)  Wt 58.242 kg (128 lb 6.4 oz)  BMI 21.37 kg/m2  SpO2 90% , BMI Body mass index is 21.37 kg/(m^2). GEN: Well nourished, well developed, in no acute distress HEENT: normal Neck: no JVD, carotid bruits, or masses Cardiac: Irreg. Irreg. ; no murmurs, rubs, or gallops,no edema  Respiratory:  clear to auscultation bilaterally, normal work of breathing GI: soft, nontender, nondistended, + BS MS: no deformity or atrophy Skin: warm and dry, no rash Neuro:  Strength and sensation are intact Psych: normal   EKG:  EKG is not ordered today.    Recent Labs: 11/10/2014: BUN 27*; Creatinine, Ser 1.15; Hemoglobin 13.4; Platelets 216.0; Potassium 4.3; Sodium 133*    Lipid Panel No results found for: CHOL, TRIG, HDL, CHOLHDL, VLDL, LDLCALC, LDLDIRECT    Wt Readings from Last 3 Encounters:  05/11/15 58.242 kg (128 lb 6.4 oz)  10/20/14 57.153 kg (126 lb)  04/23/14 56.518 kg (124 lb 9.6 oz)    ECG: OCt. 4, 2016:  Atrial fib at 72,  TWI in the anterior lateral leads   Other studies Reviewed: Additional studies/ records that were reviewed today include: . Review of the above records demonstrates:    ASSESSMENT AND PLAN:  1. Atrial fibrillation - she appears to be very stable. Heart rate is well-controlled. Continue current meds and coumadin .   2. Essential tremor  3. Chronic anticoagulation - her INR levels have been  therapeutic. She commented that she has a small amount of blood in her stool on very rare occasion. We will check a CBC when I check her blood in several weeks.  4. Hypertension - her blood pressure is a little elevated today. BP is fine.  She had BMP recently at her medical doctors office.  Will check again in 6 months .  5. Raynaud's phenomenon   Current medicines are reviewed at length with the patient today.  The patient does not have concerns regarding medicines.  The following changes have been made:  See above.   Labs/ tests ordered today include:  No orders of the defined types were placed in this encounter.     Disposition:   FU with me in 6 months.     Signed, Chas Axel, Deloris Ping, MD  05/11/2015 10:40 AM    [facility] [address] [place], [place], [place]  [address] Phone: [phone]; Fax: [phone]

## 2015-05-11 NOTE — Patient Instructions (Signed)
Medication Instructions:  Your physician recommends that you continue on your current medications as directed. Please refer to the Current Medication list given to you today.   Labwork: Your physician recommends that you return for lab work (basic metabolic panel) in: 6 months on the same day as your visit with Dr. Elease Hashimoto    Testing/Procedures: None Ordered   Follow-Up: Your physician wants you to follow-up in: 6 months with Dr. Elease Hashimoto.  You will receive a reminder letter in the mail two months in advance. If you don't receive a letter, please call our office to schedule the follow-up appointment.

## 2015-06-15 ENCOUNTER — Ambulatory Visit (INDEPENDENT_AMBULATORY_CARE_PROVIDER_SITE_OTHER): Payer: Medicare Other | Admitting: Cardiovascular Disease

## 2015-06-15 DIAGNOSIS — I482 Chronic atrial fibrillation, unspecified: Secondary | ICD-10-CM

## 2015-06-15 DIAGNOSIS — Z7901 Long term (current) use of anticoagulants: Secondary | ICD-10-CM

## 2015-06-15 LAB — POCT INR: INR: 2.2

## 2015-06-16 DIAGNOSIS — M810 Age-related osteoporosis without current pathological fracture: Secondary | ICD-10-CM | POA: Diagnosis not present

## 2015-07-06 ENCOUNTER — Ambulatory Visit (HOSPITAL_COMMUNITY)
Admission: RE | Admit: 2015-07-06 | Discharge: 2015-07-06 | Disposition: A | Discharge status: Home / Self Care | Payer: Medicare Other | Source: Ambulatory Visit | Attending: Internal Medicine | Admitting: Internal Medicine

## 2015-07-14 ENCOUNTER — Ambulatory Visit (HOSPITAL_COMMUNITY)
Admission: RE | Admit: 2015-07-14 | Discharge: 2015-07-14 | Disposition: A | Discharge status: Home / Self Care | Payer: Medicare Other | Source: Ambulatory Visit | Attending: Internal Medicine | Admitting: Internal Medicine

## 2015-07-14 ENCOUNTER — Encounter (HOSPITAL_COMMUNITY): Payer: Self-pay

## 2015-07-14 DIAGNOSIS — M810 Age-related osteoporosis without current pathological fracture: Secondary | ICD-10-CM | POA: Diagnosis not present

## 2015-07-14 MED ORDER — ZOLEDRONIC ACID 5 MG/100ML IV SOLN
5.0000 mg | Freq: Once | INTRAVENOUS | Status: AC
  Administered 2015-07-14: 5 mg via INTRAVENOUS
  Filled 2015-07-14: qty 100

## 2015-07-14 MED ORDER — SODIUM CHLORIDE 0.9 % IV SOLN
INTRAVENOUS | Status: DC
  Administered 2015-07-14: 14:00:00 via INTRAVENOUS

## 2015-07-14 NOTE — Discharge Instructions (Signed)

## 2015-07-15 DIAGNOSIS — R2681 Unsteadiness on feet: Secondary | ICD-10-CM | POA: Diagnosis not present

## 2015-07-15 DIAGNOSIS — Z9181 History of falling: Secondary | ICD-10-CM | POA: Diagnosis not present

## 2015-07-15 DIAGNOSIS — M6281 Muscle weakness (generalized): Secondary | ICD-10-CM | POA: Diagnosis not present

## 2015-07-26 DIAGNOSIS — Z9181 History of falling: Secondary | ICD-10-CM | POA: Diagnosis not present

## 2015-07-26 DIAGNOSIS — R2681 Unsteadiness on feet: Secondary | ICD-10-CM | POA: Diagnosis not present

## 2015-07-26 DIAGNOSIS — M6281 Muscle weakness (generalized): Secondary | ICD-10-CM | POA: Diagnosis not present

## 2015-07-27 ENCOUNTER — Ambulatory Visit (INDEPENDENT_AMBULATORY_CARE_PROVIDER_SITE_OTHER): Payer: Medicare Other | Admitting: Cardiology

## 2015-07-27 DIAGNOSIS — I482 Chronic atrial fibrillation, unspecified: Secondary | ICD-10-CM

## 2015-07-27 DIAGNOSIS — Z7901 Long term (current) use of anticoagulants: Secondary | ICD-10-CM

## 2015-07-27 LAB — POCT INR: INR: 2.5

## 2015-08-09 DIAGNOSIS — R296 Repeated falls: Secondary | ICD-10-CM | POA: Diagnosis not present

## 2015-08-09 DIAGNOSIS — R2681 Unsteadiness on feet: Secondary | ICD-10-CM | POA: Diagnosis not present

## 2015-08-09 DIAGNOSIS — R2689 Other abnormalities of gait and mobility: Secondary | ICD-10-CM | POA: Diagnosis not present

## 2015-08-09 DIAGNOSIS — M6281 Muscle weakness (generalized): Secondary | ICD-10-CM | POA: Diagnosis not present

## 2015-08-09 DIAGNOSIS — Z9181 History of falling: Secondary | ICD-10-CM | POA: Diagnosis not present

## 2015-08-12 DIAGNOSIS — R2681 Unsteadiness on feet: Secondary | ICD-10-CM | POA: Diagnosis not present

## 2015-08-12 DIAGNOSIS — R296 Repeated falls: Secondary | ICD-10-CM | POA: Diagnosis not present

## 2015-08-12 DIAGNOSIS — M6281 Muscle weakness (generalized): Secondary | ICD-10-CM | POA: Diagnosis not present

## 2015-08-12 DIAGNOSIS — R2689 Other abnormalities of gait and mobility: Secondary | ICD-10-CM | POA: Diagnosis not present

## 2015-08-12 DIAGNOSIS — Z9181 History of falling: Secondary | ICD-10-CM | POA: Diagnosis not present

## 2015-08-13 DIAGNOSIS — M6281 Muscle weakness (generalized): Secondary | ICD-10-CM | POA: Diagnosis not present

## 2015-08-13 DIAGNOSIS — R2681 Unsteadiness on feet: Secondary | ICD-10-CM | POA: Diagnosis not present

## 2015-08-13 DIAGNOSIS — R296 Repeated falls: Secondary | ICD-10-CM | POA: Diagnosis not present

## 2015-08-13 DIAGNOSIS — R2689 Other abnormalities of gait and mobility: Secondary | ICD-10-CM | POA: Diagnosis not present

## 2015-08-13 DIAGNOSIS — Z9181 History of falling: Secondary | ICD-10-CM | POA: Diagnosis not present

## 2015-08-16 DIAGNOSIS — R2681 Unsteadiness on feet: Secondary | ICD-10-CM | POA: Diagnosis not present

## 2015-08-16 DIAGNOSIS — R296 Repeated falls: Secondary | ICD-10-CM | POA: Diagnosis not present

## 2015-08-16 DIAGNOSIS — M6281 Muscle weakness (generalized): Secondary | ICD-10-CM | POA: Diagnosis not present

## 2015-08-16 DIAGNOSIS — Z9181 History of falling: Secondary | ICD-10-CM | POA: Diagnosis not present

## 2015-08-16 DIAGNOSIS — R2689 Other abnormalities of gait and mobility: Secondary | ICD-10-CM | POA: Diagnosis not present

## 2015-08-19 DIAGNOSIS — R2689 Other abnormalities of gait and mobility: Secondary | ICD-10-CM | POA: Diagnosis not present

## 2015-08-19 DIAGNOSIS — Z9181 History of falling: Secondary | ICD-10-CM | POA: Diagnosis not present

## 2015-08-19 DIAGNOSIS — R2681 Unsteadiness on feet: Secondary | ICD-10-CM | POA: Diagnosis not present

## 2015-08-19 DIAGNOSIS — R296 Repeated falls: Secondary | ICD-10-CM | POA: Diagnosis not present

## 2015-08-19 DIAGNOSIS — M6281 Muscle weakness (generalized): Secondary | ICD-10-CM | POA: Diagnosis not present

## 2015-08-20 DIAGNOSIS — M6281 Muscle weakness (generalized): Secondary | ICD-10-CM | POA: Diagnosis not present

## 2015-08-20 DIAGNOSIS — R2689 Other abnormalities of gait and mobility: Secondary | ICD-10-CM | POA: Diagnosis not present

## 2015-08-20 DIAGNOSIS — R2681 Unsteadiness on feet: Secondary | ICD-10-CM | POA: Diagnosis not present

## 2015-08-20 DIAGNOSIS — Z9181 History of falling: Secondary | ICD-10-CM | POA: Diagnosis not present

## 2015-08-20 DIAGNOSIS — R296 Repeated falls: Secondary | ICD-10-CM | POA: Diagnosis not present

## 2015-08-24 DIAGNOSIS — R2689 Other abnormalities of gait and mobility: Secondary | ICD-10-CM | POA: Diagnosis not present

## 2015-08-24 DIAGNOSIS — R2681 Unsteadiness on feet: Secondary | ICD-10-CM | POA: Diagnosis not present

## 2015-08-24 DIAGNOSIS — R296 Repeated falls: Secondary | ICD-10-CM | POA: Diagnosis not present

## 2015-08-24 DIAGNOSIS — M6281 Muscle weakness (generalized): Secondary | ICD-10-CM | POA: Diagnosis not present

## 2015-08-24 DIAGNOSIS — Z9181 History of falling: Secondary | ICD-10-CM | POA: Diagnosis not present

## 2015-08-26 DIAGNOSIS — R2689 Other abnormalities of gait and mobility: Secondary | ICD-10-CM | POA: Diagnosis not present

## 2015-08-26 DIAGNOSIS — R296 Repeated falls: Secondary | ICD-10-CM | POA: Diagnosis not present

## 2015-08-26 DIAGNOSIS — R2681 Unsteadiness on feet: Secondary | ICD-10-CM | POA: Diagnosis not present

## 2015-08-26 DIAGNOSIS — Z9181 History of falling: Secondary | ICD-10-CM | POA: Diagnosis not present

## 2015-08-26 DIAGNOSIS — M6281 Muscle weakness (generalized): Secondary | ICD-10-CM | POA: Diagnosis not present

## 2015-09-07 ENCOUNTER — Ambulatory Visit (INDEPENDENT_AMBULATORY_CARE_PROVIDER_SITE_OTHER): Payer: Medicare Other | Admitting: Pharmacist

## 2015-09-07 ENCOUNTER — Telehealth: Payer: Self-pay | Admitting: Nurse Practitioner

## 2015-09-07 DIAGNOSIS — Z7901 Long term (current) use of anticoagulants: Secondary | ICD-10-CM

## 2015-09-07 DIAGNOSIS — I482 Chronic atrial fibrillation, unspecified: Secondary | ICD-10-CM

## 2015-09-07 LAB — POCT INR: INR: 2.3

## 2015-09-07 NOTE — Telephone Encounter (Signed)
Spoke with patient who states she had a moderate amount of blood from her rectum yesterday after having a bowel movement.  She states she has not seen any blood since that time and she denies dark-colored or coffee ground looking stool.  I advised her that per Dr. Elease Hashimoto, she may hold her coumadin if she experiences bleeding again.  I advised her to call our office to notify us if this occurs again.  She states she is going now for her pt/inr.  I advised her that she will be notified of the result and coumadin dosage by our [facility] staff.  She verbalized understanding and agreement with plan of care.

## 2015-09-07 NOTE — Telephone Encounter (Signed)
Received note from Kim from HIM that patient had rectal bleeding on 1/30 and would like to know what she should do.  I left a message for the patient to call the office.

## 2015-09-07 NOTE — Telephone Encounter (Signed)
Spoke with pt.  INR was 2.3.  She states she only had the one episode and no other issues.  Asked to her call us with any other issues otherwise will continue normal dose of Coumadin.

## 2015-10-19 ENCOUNTER — Ambulatory Visit (INDEPENDENT_AMBULATORY_CARE_PROVIDER_SITE_OTHER): Payer: Medicare Other | Admitting: Pharmacist

## 2015-10-19 DIAGNOSIS — Z7901 Long term (current) use of anticoagulants: Secondary | ICD-10-CM

## 2015-10-19 DIAGNOSIS — I482 Chronic atrial fibrillation, unspecified: Secondary | ICD-10-CM

## 2015-10-19 LAB — POCT INR: INR: 4

## 2015-10-25 DIAGNOSIS — N390 Urinary tract infection, site not specified: Secondary | ICD-10-CM | POA: Diagnosis not present

## 2015-10-25 DIAGNOSIS — R8299 Other abnormal findings in urine: Secondary | ICD-10-CM | POA: Diagnosis not present

## 2015-10-26 ENCOUNTER — Telehealth: Payer: Self-pay | Admitting: *Deleted

## 2015-10-26 NOTE — Telephone Encounter (Signed)
Patient called to inform us that she was prescribed Nitrofurantoin 100mg  twice a day for a bladder infection for 7 days.  She stated she will start the medication today. Instructed her that the medication does not interfere with coumadin & advised to take as prescribed by her Physician & she verbalized understanding.  Advised to call back with any other new medications & she verbalized understanding.

## 2015-11-02 ENCOUNTER — Ambulatory Visit (INDEPENDENT_AMBULATORY_CARE_PROVIDER_SITE_OTHER): Payer: Medicare Other | Admitting: Cardiovascular Disease

## 2015-11-02 DIAGNOSIS — I482 Chronic atrial fibrillation, unspecified: Secondary | ICD-10-CM

## 2015-11-02 DIAGNOSIS — Z7901 Long term (current) use of anticoagulants: Secondary | ICD-10-CM

## 2015-11-02 LAB — POCT INR: INR: 1.7

## 2015-11-08 ENCOUNTER — Encounter: Payer: Self-pay | Admitting: Cardiovascular Disease

## 2015-11-08 ENCOUNTER — Ambulatory Visit (INDEPENDENT_AMBULATORY_CARE_PROVIDER_SITE_OTHER): Payer: Medicare Other | Admitting: Cardiovascular Disease

## 2015-11-08 VITALS — BP 134/68 | HR 80 | Ht 66.0 in | Wt 127.1 lb

## 2015-11-08 DIAGNOSIS — I10 Essential (primary) hypertension: Secondary | ICD-10-CM | POA: Diagnosis not present

## 2015-11-08 DIAGNOSIS — I482 Chronic atrial fibrillation, unspecified: Secondary | ICD-10-CM

## 2015-11-08 MED ORDER — PROPRANOLOL HCL ER 80 MG PO CP24: 80.0000 mg | ORAL_CAPSULE | Freq: Every day | ORAL | Status: DC

## 2015-11-08 NOTE — Progress Notes (Signed)
Cardiology Office Note   Date:  11/08/2015   ID:  Angelica Mejia, DOB 1918/12/22, MRN [id number]  PCP:  Garlan Fillers, MD  Cardiologist:   Vesta Mixer, MD   Chief Complaint  Patient presents with  . Follow-up   1. Atrial fibrillation 2. Essential tremor 3. Chronic anticoagulation 4. Hypertension 5. Raynaud's phenomenon  History of Present Illness:  Pt is doing well. No complaints. She lives at [facility] ([facility]). She has noticed that her HR is very low. She has not had as much energy. She denies any syncope.   October 17, 2012:  She is doing well. She had an episode of more severe palpitations. The episode lasted for 3 hours and resolved overnight.   Sept. 12., 2014:  Angelica Mejia is doing well. She is able to do her normal activities. She goes to see her husband at [facility]. She is at [facility].  Asymptomatic from an A fib standpoint.   October 22, 2013:  Angelica Mejia is doing ok. She is having to take care of her husband who has dementia. No CP , no dyspnea,  No syncope.   Sept. 17, 2015:  Angelica Mejia is doing ok. No Cp or dyspnea.  Has a problem with her renal function and her medical doctor has decreased her Lisinopril to 5 mg ( despite the list saying 20 mg below)    History of Present Illness: Angelica Mejia is a 82 y.o. female who presents for follow up of her atrial fib and HTN.  She is doing ok Commented that she has occasional small amount of blood in her stool.  Has not had a CBC drawn here in 2 years.   Oct. 4, 2016:   Angelica Mejia is seen back today for follow up of her atrial fib and HTN. Has mild bruising . No CP or dyspnea.  Has plantar fasciatis.  November 08, 2015: Angelica Mejia is seen back today for follow up visit . Has just gotten over a virus .   Has had more shortness of breath and has had palpitations . Seems to be getting better.   Still has fatigue .    Past Medical History  Diagnosis Date  . Atrial fibrillation (HCC)     . Tremor, essential   . Chronic anticoagulation   . Hypertension   . Raynaud phenomenon     Past Surgical History  Procedure Laterality Date  . Cholecystectomy       Current Outpatient Prescriptions  Medication Sig Dispense Refill  . Calcium Carbonate-Vitamin D (CALTRATE 600+D PO) Take by mouth 2 (two) times daily.      . furosemide (LASIX) 40 MG tablet Take 40 mg by mouth daily.    Marland Kitchen lisinopril (PRINIVIL,ZESTRIL) 10 MG tablet Take 1 tablet (10 mg total) by mouth daily. 90 tablet 3  . propranolol (INDERAL) 10 MG tablet Take 1 tablet (10 mg total) by mouth 4 (four) times daily as needed (FOR PALPITATIONS, MAY TAKE ONE EVERY THIRTY MINUTES UP TO 4 DOSES.). 30 tablet 5  . tobramycin-dexamethasone (TOBRADEX) ophthalmic solution Place 1 drop into both eyes daily.     Marland Kitchen triamterene-hydrochlorothiazide (MAXZIDE-25) 37.5-25 MG tablet Take 1 tablet by mouth daily.    Marland Kitchen warfarin (COUMADIN) 5 MG tablet TAKE AS DIRECTED BY  COUMADIN CLINIC 60 tablet 1   No current facility-administered medications for this visit.    Allergies:   Other    Social History:  The patient  reports that she has never smoked. She does  not have any smokeless tobacco history on file. She reports that she does not drink alcohol or use illicit drugs.   Family History:  The patient's family history includes Heart attack in her father; Hypertension in her father.    ROS:  Please see the history of present illness.    Review of Systems: Constitutional:  denies fever, chills, diaphoresis, appetite change and fatigue.  HEENT: denies photophobia, eye pain, redness, hearing loss, ear pain, congestion, sore throat, rhinorrhea, sneezing, neck pain, neck stiffness and tinnitus.  Respiratory: denies SOB, DOE, cough, chest tightness, and wheezing.  Cardiovascular: denies chest pain, palpitations and leg swelling.  Gastrointestinal: denies nausea, vomiting, abdominal pain, diarrhea, constipation, blood in stool.   Genitourinary: denies dysuria, urgency, frequency, hematuria, flank pain and difficulty urinating.  Musculoskeletal: denies  myalgias, back pain, joint swelling, arthralgias and gait problem.   Skin: denies pallor, rash and wound.  Neurological: denies dizziness, seizures, syncope, weakness, light-headedness, numbness and headaches.   Hematological: denies adenopathy, easy bruising, personal or family bleeding history.  Psychiatric/ Behavioral: denies suicidal ideation, mood changes, confusion, nervousness, sleep disturbance and agitation.       All other systems are reviewed and negative.    PHYSICAL EXAM: VS:  BP 134/68 mmHg  Pulse 80  Ht  (1.676 m)  Wt 127 lb 1.9 oz (57.661 kg)  BMI 20.53 kg/m2 , BMI Body mass index is 20.53 kg/(m^2). GEN: Well nourished, well developed, in no acute distress HEENT: normal Neck: no JVD, carotid bruits, or masses Cardiac: Irreg. Irreg. ; no murmurs, rubs, or gallops,no edema  Respiratory:  clear to auscultation bilaterally, normal work of breathing GI: soft, nontender, nondistended, + BS MS: no deformity or atrophy Skin: warm and dry, no rash Neuro:  Strength and sensation are intact Psych: normal   EKG:  EKG is not ordered today.    Recent Labs: 11/10/2014: BUN 27*; Creatinine, Ser 1.15; Hemoglobin 13.4; Platelets 216.0; Potassium 4.3; Sodium 133*    Lipid Panel No results found for: CHOL, TRIG, HDL, CHOLHDL, VLDL, LDLCALC, LDLDIRECT    Wt Readings from Last 3 Encounters:  11/08/15 127 lb 1.9 oz (57.661 kg)  07/14/15 128 lb (58.06 kg)  05/11/15 128 lb 6.4 oz (58.242 kg)    ECG: OCt. 4, 2016:  Atrial fib at 72,  TWI in the anterior lateral leads   Other studies Reviewed: Additional studies/ records that were reviewed today include: . Review of the above records demonstrates:    ASSESSMENT AND PLAN:  1. Atrial fibrillation - she appears to be very stable. Heart rate is well-controlled. Continue current meds and coumadin  .   2. Essential tremor  3. Chronic anticoagulation - her INR levels have been therapeutic. She commented that she has a small amount of blood in her stool on very rare occasion. We will check a CBC when I check her blood in several weeks.  4. Hypertension - her blood pressure is a little elevated today. BP is fine.  She had BMP recently at her medical doctors office.  Will check again in 6 months .  5. Raynaud's phenomenon   Current medicines are reviewed at length with the patient today.  The patient does not have concerns regarding medicines.  The following changes have been made:  See above.   Labs/ tests ordered today include:  No orders of the defined types were placed in this encounter.     Disposition:   FU with me in 6 months.  Signed, Nahser, Deloris PingPhilip J, MD  11/08/2015 11:28 AM    [facility] [address], [place], [place]  [address] Phone: [phone]) [phone]; Fax: [phone]

## 2015-11-08 NOTE — Patient Instructions (Signed)
Medication Instructions:  Your physician recommends that you continue on your current medications as directed. Please refer to the Current Medication list given to you today.   Labwork: None Ordered   Testing/Procedures: None Ordered   Follow-Up: Your physician wants you to follow-up in: 6 months with Dr. Nahser.  You will receive a reminder letter in the mail two months in advance. If you don't receive a letter, please call our office to schedule the follow-up appointment.   If you need a refill on your cardiac medications before your next appointment, please call your pharmacy.   Thank you for choosing [facility]! Conita Amenta, RN [phone]    

## 2015-11-12 ENCOUNTER — Other Ambulatory Visit: Payer: Self-pay | Admitting: Cardiovascular Disease

## 2015-11-15 DIAGNOSIS — H10413 Chronic giant papillary conjunctivitis, bilateral: Secondary | ICD-10-CM | POA: Diagnosis not present

## 2015-11-15 DIAGNOSIS — H01112 Allergic dermatitis of right lower eyelid: Secondary | ICD-10-CM | POA: Diagnosis not present

## 2015-11-15 DIAGNOSIS — Z947 Corneal transplant status: Secondary | ICD-10-CM | POA: Diagnosis not present

## 2015-11-16 ENCOUNTER — Ambulatory Visit (INDEPENDENT_AMBULATORY_CARE_PROVIDER_SITE_OTHER): Payer: Medicare Other | Admitting: Pharmacist

## 2015-11-16 DIAGNOSIS — Z7901 Long term (current) use of anticoagulants: Secondary | ICD-10-CM

## 2015-11-16 DIAGNOSIS — I482 Chronic atrial fibrillation, unspecified: Secondary | ICD-10-CM

## 2015-11-16 LAB — POCT INR: INR: 2.8

## 2015-11-18 DIAGNOSIS — Z9181 History of falling: Secondary | ICD-10-CM | POA: Diagnosis not present

## 2015-11-18 DIAGNOSIS — N3946 Mixed incontinence: Secondary | ICD-10-CM | POA: Diagnosis not present

## 2015-11-18 DIAGNOSIS — R2681 Unsteadiness on feet: Secondary | ICD-10-CM | POA: Diagnosis not present

## 2015-11-18 DIAGNOSIS — R29898 Other symptoms and signs involving the musculoskeletal system: Secondary | ICD-10-CM | POA: Diagnosis not present

## 2015-11-18 DIAGNOSIS — M6281 Muscle weakness (generalized): Secondary | ICD-10-CM | POA: Diagnosis not present

## 2015-11-24 DIAGNOSIS — N3946 Mixed incontinence: Secondary | ICD-10-CM | POA: Diagnosis not present

## 2015-11-24 DIAGNOSIS — Z9181 History of falling: Secondary | ICD-10-CM | POA: Diagnosis not present

## 2015-11-24 DIAGNOSIS — R2681 Unsteadiness on feet: Secondary | ICD-10-CM | POA: Diagnosis not present

## 2015-11-24 DIAGNOSIS — R29898 Other symptoms and signs involving the musculoskeletal system: Secondary | ICD-10-CM | POA: Diagnosis not present

## 2015-11-24 DIAGNOSIS — M6281 Muscle weakness (generalized): Secondary | ICD-10-CM | POA: Diagnosis not present

## 2015-12-01 DIAGNOSIS — R29898 Other symptoms and signs involving the musculoskeletal system: Secondary | ICD-10-CM | POA: Diagnosis not present

## 2015-12-01 DIAGNOSIS — R2681 Unsteadiness on feet: Secondary | ICD-10-CM | POA: Diagnosis not present

## 2015-12-01 DIAGNOSIS — M6281 Muscle weakness (generalized): Secondary | ICD-10-CM | POA: Diagnosis not present

## 2015-12-01 DIAGNOSIS — Z9181 History of falling: Secondary | ICD-10-CM | POA: Diagnosis not present

## 2015-12-01 DIAGNOSIS — N3946 Mixed incontinence: Secondary | ICD-10-CM | POA: Diagnosis not present

## 2015-12-07 LAB — POCT INR: INR: 2.4

## 2015-12-08 ENCOUNTER — Ambulatory Visit (INDEPENDENT_AMBULATORY_CARE_PROVIDER_SITE_OTHER): Payer: Medicare Other | Admitting: Cardiology

## 2015-12-08 DIAGNOSIS — I482 Chronic atrial fibrillation, unspecified: Secondary | ICD-10-CM

## 2015-12-08 DIAGNOSIS — M6281 Muscle weakness (generalized): Secondary | ICD-10-CM | POA: Diagnosis not present

## 2015-12-08 DIAGNOSIS — Z7901 Long term (current) use of anticoagulants: Secondary | ICD-10-CM

## 2015-12-08 DIAGNOSIS — R2681 Unsteadiness on feet: Secondary | ICD-10-CM | POA: Diagnosis not present

## 2015-12-08 DIAGNOSIS — Z9181 History of falling: Secondary | ICD-10-CM | POA: Diagnosis not present

## 2015-12-08 DIAGNOSIS — R29898 Other symptoms and signs involving the musculoskeletal system: Secondary | ICD-10-CM | POA: Diagnosis not present

## 2015-12-17 DIAGNOSIS — M6281 Muscle weakness (generalized): Secondary | ICD-10-CM | POA: Diagnosis not present

## 2015-12-17 DIAGNOSIS — R2681 Unsteadiness on feet: Secondary | ICD-10-CM | POA: Diagnosis not present

## 2015-12-17 DIAGNOSIS — Z9181 History of falling: Secondary | ICD-10-CM | POA: Diagnosis not present

## 2015-12-17 DIAGNOSIS — R29898 Other symptoms and signs involving the musculoskeletal system: Secondary | ICD-10-CM | POA: Diagnosis not present

## 2015-12-23 ENCOUNTER — Ambulatory Visit (INDEPENDENT_AMBULATORY_CARE_PROVIDER_SITE_OTHER): Payer: Medicare Other | Admitting: Neurology

## 2015-12-23 ENCOUNTER — Encounter: Payer: Self-pay | Admitting: Neurology

## 2015-12-23 VITALS — BP 158/89 | HR 96 | Ht 66.0 in | Wt 126.6 lb

## 2015-12-23 DIAGNOSIS — G250 Essential tremor: Secondary | ICD-10-CM

## 2015-12-23 NOTE — Progress Notes (Signed)
[facility]    Provider:  Dr Lucia GaskinsAhern Referring Provider: Jarome MatinPaterson, Daniel, MD Primary Care Physician:  Garlan FillersPATERSON,DANIEL G, MD  CC:  Chronic tremor and gait disorder  HPI:  Angelica Mejia is a wonderful 82 y.o. female here as a referral from Dr. Eloise HarmanPaterson for tremor. Past medical history of atrial fibrillation, essential tremor, chronic anticoagulation on warfarin, hypertension, Raynaud's phenomena, chronic kidney disease stage III, osteoporosis. Tremor started 40 years ago and diagnosis of familial essential tremor. Her father had a tremor as did her older brother. [facility] lives [facility] and she does not have any children. She was married for 57 years and her husband just passed away last year. The tremor used to be only when doing things but now is also a resting in both hands, left is often worse. She cant write, she can't eat soup in public. Very disruptive to life  She has started noticing the tremor in chin as well. Propranolol used to help. Her tremor is slowly worsening. Her cardiologit doesn't want her to increase the propranolol.She has a little drooling. She uses a walker but she forgets to use it sometimes. No recent falls, hasn't fallen since last winter over a year ago. No internal tremor. No SOB or light headedness. No tremor in the legs. Denies memory problems. Denies any other focal neurologic deficits. Patient says she has had a recent thyroid test. Patient says her friend is a patient of mine and she's been significantly helped by primidone and patient would like to try this medication as well.  Reviewed notes, labs and imaging from outside physicians, which showed:  BMP in April 2016 showed low sodium 133, chloride 95, CO2 35, BUN 27 and GFR 46.76. CBC was unremarkable. Last INR in May 2017 was 2.4.  Patient is seen by Dr. Kristeen MissPhilip Nahser in cardiology. Last note stated patient had noticed her HR very low. Atrial fibrillation is very stable, heart rate is  well controlled, she is on long-term Coumadin. Her INR is subtherapeutic. Physician will check a CBC in several weeks per note.  Review of Systems: Patient complains of symptoms per HPI as well as the following symptoms: No chest pain, no shortness of breath Pertinent negatives per HPI. All others negative.   Social History   Social History  . Marital Status: Married    Spouse Name: N/A  . Number of Children: N/A  . Years of Education: 16   Occupational History  . RetiredNurse, mental health- [employer]    Social History Main Topics  . Smoking status: Never Smoker   . Smokeless tobacco: Not on file  . Alcohol Use: No  . Drug Use: No  . Sexual Activity: Not on file   Other Topics Concern  . Not on file   Social History Narrative   [facility] alone. [facility]   Caffeine use: Decaf coffee every morning       Family History  Problem Relation Age of Onset  . Heart disease    . Hypertension    . Stroke    . Cancer    . Hypertension Father   . Heart attack Father     Past Medical History  Diagnosis Date  . Atrial fibrillation (HCC)   . Tremor, essential   . Chronic anticoagulation   . Hypertension   . Raynaud phenomenon     Past Surgical History  Procedure Laterality Date  . Cholecystectomy      Current Outpatient Prescriptions  Medication Sig Dispense Refill  .  lisinopril (PRINIVIL,ZESTRIL) 10 MG tablet Take 1 tablet by mouth  daily 90 tablet 1  . propranolol ER (INDERAL LA) 80 MG 24 hr capsule Take 1 capsule (80 mg total) by mouth daily. 90 capsule 3  . triamterene-hydrochlorothiazide (MAXZIDE-25) 37.5-25 MG tablet Take 1 tablet by mouth daily.    Marland Kitchen warfarin (COUMADIN) 5 MG tablet Take as directed by  Coumadin Clinic 60 tablet 1   No current facility-administered medications for this visit.    Allergies as of 12/23/2015 - Review Complete 12/23/2015  Allergen Reaction Noted  . Other Nausea And Vomiting 07/14/2015    Vitals: BP 158/89 mmHg  Pulse 96  Ht 5'  6" (1.676 m)  Wt 126 lb 9.6 oz (57.425 kg)  BMI 20.44 kg/m2 Last Weight:  Wt Readings from Last 1 Encounters:  12/23/15 126 lb 9.6 oz (57.425 kg)   Last Height:   Ht Readings from Last 1 Encounters:  12/23/15 5\' 6"  (1.676 m)    Physical exam: Exam: Gen: NAD, conversant                    CV: RRR, no MRG. No Carotid Bruits. No peripheral edema, warm, nontender Eyes: Conjunctivae clear without exudates or hemorrhage  Neuro: Detailed Neurologic Exam  Speech:    Speech is normal; fluent and spontaneous with normal comprehension.  Cognition:    The patient is oriented to person, place, and time;     recent and remote memory intact;     language fluent;     normal attention, concentration,     fund of knowledge Cranial Nerves:    The pupils are pinpoint, equal, round, and reactive to light. **The fundi are normal and spontaneous venous pulsations are present. Visual fields are full to finger confrontation. Extraocular movements are intact. Trigeminal sensation is intact and the muscles of mastication are normal. The face is symmetric. The palate elevates in the midline. Hearing impaired. Voice is normal. Shoulder shrug is normal. The tongue has normal motion without fasciculations.   Coordination:    Normal finger to nose and heel to shin. Normal rapid alternating movements.   Gait:    Slightly shuffling with a few steps needed to turn, imbalance on getting uo from seat  Motor Observation: Chin and voice tremor, postural tremor c/w esential tremor with some resting tremor as well. Increased on FTN.  Tone:    Normal muscle tone, no cogwheeling  Posture:    Stooped    Strength: bilateral hip flexion weakness otherwise    Strength is V/V in the upper and lower limbs.      Sensation: intact to LT     Reflex Exam:  DTR's:    Deep tendon reflexes in the upper and lower extremities are normal bilaterally.   Toes:    The toes are downgoing bilaterally.   Clonus:    Clonus  is absent.      Assessment/Plan:  [age] year old female with essential tremor, A. fib on Coumadin. She is on propranolol and she has noticed some low heart rates so does not want to increase it. She says that I treat a friend of hers with primidone and she would like to try this for her essential tremor as it seemed to have worked extremely well for her friend. I discussed with patient that primidone is an excellent medication for essential tremor and it is the one that I would use in her given we cannot increase her propranolol. Unfortunately she is on  Coumadin and primidone interacts with Coumadin. Told her I will discuss this with Dr.Nahser, not sure we can do it. Would discuss with her cardiologist the possibility of using Eliquis instead of Coumadin however primidone may interact with Eliquis as well. There are other agents we can use with essential tremor such as Neurontin however they're not as good as primidone or propranolol. Will discuss with Dr. Elease Hashimoto.  Naomie Dean, MD  [facility] [address] [place], [place] [address]  Phone [phone] Fax [phone]

## 2015-12-23 NOTE — Patient Instructions (Signed)
Remember to drink plenty of fluid, eat healthy meals and do not skip any meals. Try to eat protein with a every meal and eat a healthy snack such as fruit or nuts in between meals. Try to keep a regular sleep-wake schedule and try to exercise daily, particularly in the form of walking, 20-30 minutes a day, if you can.   As far as your medications are concerned, I would like to suggest: I need to discuss with Dr. Elease HashimotoNahser but may start Primidone 25mg  at night (1/2 tablet) and then in 1-2 weeks increase to a whole tablet. However this may react with Warfarin so need to discuss with Dr. Elease HashimotoNahser.   I would like to see you back in 6 weeks, sooner if we need to. Please call us with any interim questions, concerns, problems, updates or refill requests.    Our phone number is [phone]. We also have an after hours call service for urgent matters and there is a physician on-call for urgent questions. For any emergencies you know to call 911 or go to the nearest emergency room

## 2015-12-24 DIAGNOSIS — M6281 Muscle weakness (generalized): Secondary | ICD-10-CM | POA: Diagnosis not present

## 2015-12-24 DIAGNOSIS — Z9181 History of falling: Secondary | ICD-10-CM | POA: Diagnosis not present

## 2015-12-24 DIAGNOSIS — R29898 Other symptoms and signs involving the musculoskeletal system: Secondary | ICD-10-CM | POA: Diagnosis not present

## 2015-12-24 DIAGNOSIS — R2681 Unsteadiness on feet: Secondary | ICD-10-CM | POA: Diagnosis not present

## 2015-12-27 DIAGNOSIS — Z9181 History of falling: Secondary | ICD-10-CM | POA: Diagnosis not present

## 2015-12-27 DIAGNOSIS — R2681 Unsteadiness on feet: Secondary | ICD-10-CM | POA: Diagnosis not present

## 2015-12-27 DIAGNOSIS — R29898 Other symptoms and signs involving the musculoskeletal system: Secondary | ICD-10-CM | POA: Diagnosis not present

## 2015-12-27 DIAGNOSIS — M6281 Muscle weakness (generalized): Secondary | ICD-10-CM | POA: Diagnosis not present

## 2015-12-28 ENCOUNTER — Telehealth: Payer: Self-pay | Admitting: Pharmacist

## 2015-12-28 ENCOUNTER — Other Ambulatory Visit: Payer: Self-pay | Admitting: Neurology

## 2015-12-28 ENCOUNTER — Telehealth: Payer: Self-pay | Admitting: Neurology

## 2015-12-28 MED ORDER — PRIMIDONE 50 MG PO TABS: ORAL_TABLET | ORAL | Status: DC

## 2015-12-28 NOTE — Telephone Encounter (Signed)
Pt to be started on low dose primidone 25mg  titrated up to 50mg  by neurology for tremor. Primidone is a strong CYP inducer and will likely decrease INR. Still safer to keep pt on Coumadin and monitor INR more closely rather than change anticoag agent to a DOAC given pt's poor renal function and stronger interaction between DOACs and primidone. [facility] week after starting primidone.

## 2015-12-28 NOTE — Telephone Encounter (Signed)
Please call Angelica Mejia and let her know that I discussed the medication for her tremor with Dr. Elease HashimotoNahser. He is ok with starting it. [facility] are aware at [facility] that they may have to adjust her dose of Warfarin if needed. Start with 1/2 a pill of Mysoline (primidone)at night. They are small pills so she may need to ask for help cutting them. Then in 2 weeks she can increase to a whole pill before bedtime. In one month if the tremor is not better, she can call our office and we can increase it even further. We like to go very slowly. Watch out for sedation at night especially if you get up to go to the bathroom. Keep a light on. Thanks

## 2015-12-29 NOTE — Telephone Encounter (Signed)
I spoke to pt and advised her that Dr. Lucia GaskinsAhern discussed starting primidone with Dr. Elease HashimotoNahser and it is ok with it and that they may need to adjust pt's dose of warfarin. Pt should start 1/2 pill of myosoline at night and she might need help cutting them. After two weeks, she can increase to a whole pill at bedtime. I advised her that she needs to watch out for sedation at night, especially if she gets up to go to the bathroom, to keep a light on. Pt said she will be very careful. Pt verbalized understanding of instructions and side effects. Pt knows that the medication was sent to [facility]. Pt said she will call our office for any further questions or concerns.

## 2016-01-04 ENCOUNTER — Ambulatory Visit (INDEPENDENT_AMBULATORY_CARE_PROVIDER_SITE_OTHER): Payer: Medicare Other | Admitting: Cardiology

## 2016-01-04 DIAGNOSIS — I482 Chronic atrial fibrillation, unspecified: Secondary | ICD-10-CM

## 2016-01-04 DIAGNOSIS — Z7901 Long term (current) use of anticoagulants: Secondary | ICD-10-CM

## 2016-01-04 LAB — POCT INR: INR: 2.8

## 2016-01-11 ENCOUNTER — Ambulatory Visit (INDEPENDENT_AMBULATORY_CARE_PROVIDER_SITE_OTHER): Payer: Medicare Other | Admitting: Internal Medicine

## 2016-01-11 DIAGNOSIS — I482 Chronic atrial fibrillation, unspecified: Secondary | ICD-10-CM

## 2016-01-11 DIAGNOSIS — Z7901 Long term (current) use of anticoagulants: Secondary | ICD-10-CM

## 2016-01-11 LAB — POCT INR: INR: 2

## 2016-01-25 ENCOUNTER — Ambulatory Visit (INDEPENDENT_AMBULATORY_CARE_PROVIDER_SITE_OTHER): Payer: Medicare Other | Admitting: Internal Medicine

## 2016-01-25 DIAGNOSIS — I482 Chronic atrial fibrillation, unspecified: Secondary | ICD-10-CM

## 2016-01-25 DIAGNOSIS — Z7901 Long term (current) use of anticoagulants: Secondary | ICD-10-CM

## 2016-01-25 LAB — POCT INR: INR: 1.6

## 2016-01-31 ENCOUNTER — Telehealth: Payer: Self-pay | Admitting: Neurology

## 2016-01-31 NOTE — Telephone Encounter (Signed)
Dr Lucia GaskinsAhern- Please advise Called pt back. She stated she is very dizzy, fatigued, having trouble sleeping and walking around. She wants to know if she can go off of the medication for a month and see if her sx improve. Advised Dr Lucia GaskinsAhern seeing pt right now and I will speak to her and call her back to advise. She verbalized understanding.

## 2016-01-31 NOTE — Telephone Encounter (Signed)
Pt wants to stop taking primidone (MYSOLINE) 50 MG tablet. Says it is working well but the side effects are horrible. Please call

## 2016-01-31 NOTE — Telephone Encounter (Signed)
Dr Lucia GaskinsAhern- FYI Called pt back. Advised per Dr Lucia GaskinsAhern to stop the primidone. If sx do not improve she can call office back. Advised her to keep f/u on 02/10/16 at 230pm, check in 215. She verbalized understanding.

## 2016-01-31 NOTE — Telephone Encounter (Signed)
Yes, please go off of the medication and see if symptoms improve thanks

## 2016-02-03 DIAGNOSIS — E871 Hypo-osmolality and hyponatremia: Secondary | ICD-10-CM | POA: Diagnosis not present

## 2016-02-03 DIAGNOSIS — Z6821 Body mass index (BMI) 21.0-21.9, adult: Secondary | ICD-10-CM | POA: Diagnosis not present

## 2016-02-03 DIAGNOSIS — H6123 Impacted cerumen, bilateral: Secondary | ICD-10-CM | POA: Diagnosis not present

## 2016-02-03 DIAGNOSIS — H903 Sensorineural hearing loss, bilateral: Secondary | ICD-10-CM | POA: Diagnosis not present

## 2016-02-03 DIAGNOSIS — R600 Localized edema: Secondary | ICD-10-CM | POA: Diagnosis not present

## 2016-02-03 DIAGNOSIS — I8390 Asymptomatic varicose veins of unspecified lower extremity: Secondary | ICD-10-CM | POA: Diagnosis not present

## 2016-02-03 DIAGNOSIS — I10 Essential (primary) hypertension: Secondary | ICD-10-CM | POA: Diagnosis not present

## 2016-02-09 ENCOUNTER — Ambulatory Visit (INDEPENDENT_AMBULATORY_CARE_PROVIDER_SITE_OTHER): Payer: Medicare Other | Admitting: Internal Medicine

## 2016-02-09 DIAGNOSIS — Z7901 Long term (current) use of anticoagulants: Secondary | ICD-10-CM

## 2016-02-09 DIAGNOSIS — I482 Chronic atrial fibrillation, unspecified: Secondary | ICD-10-CM

## 2016-02-09 LAB — POCT INR: INR: 1.6

## 2016-02-10 ENCOUNTER — Ambulatory Visit (INDEPENDENT_AMBULATORY_CARE_PROVIDER_SITE_OTHER): Payer: Medicare Other | Admitting: Neurology

## 2016-02-10 VITALS — BP 150/72 | HR 88 | Ht 66.0 in | Wt 129.4 lb

## 2016-02-10 DIAGNOSIS — G250 Essential tremor: Secondary | ICD-10-CM

## 2016-02-10 MED ORDER — GABAPENTIN 300 MG PO CAPS: 300.0000 mg | ORAL_CAPSULE | Freq: Three times a day (TID) | ORAL | Status: DC

## 2016-02-10 NOTE — Patient Instructions (Addendum)
Remember to drink plenty of fluid, eat healthy meals and do not skip any meals. Try to eat protein with a every meal and eat a healthy snack such as fruit or nuts in between meals. Try to keep a regular sleep-wake schedule and try to exercise daily, particularly in the form of walking, 20-30 minutes a day, if you can.   As far as your medications are concerned, I would like to suggest: Day 1,2,3: 1 pill in the morning at 7am Days 4,5,6: 1 pill twice a day at 7am and 5pm Then increase to 1 pill 3x a day. At 7am, noon and 5pm  If Dr. Elease HashimotoNahser says it is ok, may also increase your propranolol as well  I would like to see you back in 3 months, sooner if we need to. Please call us with any interim questions, concerns, problems, updates or refill requests.   Our phone number is [phone]. We also have an after hours call service for urgent matters and there is a physician on-call for urgent questions. For any emergencies you know to call 911 or go to the nearest emergency room  Propranolol extended-release capsules What is this medicine? PROPRANOLOL (proe PRAN oh lole) is a beta-blocker. Beta-blockers reduce the workload on the heart and help it to beat more regularly. This medicine is used to treat high blood pressure, heart muscle disease, and prevent chest pain caused by angina. It is also used to prevent migraine headaches. You should not use this medicine to treat a migraine that has already started. This medicine may be used for other purposes; ask your health care provider or pharmacist if you have questions. What should I tell my health care provider before I take this medicine? They need to know if you have any of these conditions: -circulation problems, or blood vessel disease -diabetes -history of heart attack or heart disease, vasospastic angina -kidney disease -liver disease -lung or breathing disease, like asthma or emphysema -pheochromocytoma -slow heart rate -thyroid  disease -an unusual or allergic reaction to propranolol, other beta-blockers, medicines, foods, dyes, or preservatives -pregnant or trying to get pregnant -breast-feeding How should I use this medicine? Take this medicine by mouth with a glass of water. Follow the directions on the prescription label. Do not crush or chew. Take your doses at regular intervals. Do not take your medicine more often than directed. Do not stop taking except on the advice of your doctor or health care professional. Talk to your pediatrician regarding the use of this medicine in children. Special care may be needed. Overdosage: If you think you have taken too much of this medicine contact a poison control center or emergency room at once. NOTE: This medicine is only for you. Do not share this medicine with others. What if I miss a dose? If you miss a dose, take it as soon as you can. If it is almost time for your next dose, take only that dose. Do not take double or extra doses. What may interact with this medicine? Do not take this medicine with any of the following medications: -feverfew -phenothiazines like chlorpromazine, mesoridazine, prochlorperazine, thioridazine This medicine may also interact with the following medications: -aluminum hydroxide gel -antipyrine -antiviral medicines for HIV or AIDS -barbiturates like phenobarbital -certain medicines for blood pressure, heart disease, irregular heart beat -cimetidine -ciprofloxacin -diazepam -fluconazole -haloperidol -isoniazid -medicines for cholesterol like cholestyramine or colestipol -medicines for mental depression -medicines for migraine headache like almotriptan, eletriptan, frovatriptan, naratriptan, rizatriptan, sumatriptan, zolmitriptan -NSAIDs, medicines  for pain and inflammation, like ibuprofen or naproxen -phenytoin -rifampin -teniposide -theophylline -thyroid medicines -tolbutamide -warfarin -zileuton This list may not describe all  possible interactions. Give your health care provider a list of all the medicines, herbs, non-prescription drugs, or dietary supplements you use. Also tell them if you smoke, drink alcohol, or use illegal drugs. Some items may interact with your medicine. What should I watch for while using this medicine? Visit your doctor or health care professional for regular check ups. Contact your doctor right away if your symptoms worsen. Check your blood pressure and pulse rate regularly. Ask your health care professional what your blood pressure and pulse rate should be, and when you should contact them. Do not stop taking this medicine suddenly. This could lead to serious heart-related effects. You may get drowsy or dizzy. Do not drive, use machinery, or do anything that needs mental alertness until you know how this drug affects you. Do not stand or sit up quickly, especially if you are an older patient. This reduces the risk of dizzy or fainting spells. Alcohol can make you more drowsy and dizzy. Avoid alcoholic drinks. This medicine can affect blood sugar levels. If you have diabetes, check with your doctor or health care professional before you change your diet or the dose of your diabetic medicine. Do not treat yourself for coughs, colds, or pain while you are taking this medicine without asking your doctor or health care professional for advice. Some ingredients may increase your blood pressure. What side effects may I notice from receiving this medicine? Side effects that you should report to your doctor or health care professional as soon as possible: -allergic reactions like skin rash, itching or hives, swelling of the face, lips, or tongue -breathing problems -changes in blood sugar -cold hands or feet -difficulty sleeping, nightmares -dry peeling skin -hallucinations -muscle cramps or weakness -slow heart rate -swelling of the legs and ankles -vomiting Side effects that usually do not require  medical attention (report to your doctor or health care professional if they continue or are bothersome): -change in sex drive or performance -diarrhea -dry sore eyes -hair loss -nausea -weak or tired This list may not describe all possible side effects. Call your doctor for medical advice about side effects. You may report side effects to FDA at 1-800-FDA-1088. Where should I keep my medicine? Keep out of the reach of children. Store at room temperature between 15 and 30 degrees C (59 and 86 degrees F). Protect from light, moisture and freezing. Keep container tightly closed. Throw away any unused medicine after the expiration date. NOTE: This sheet is a summary. It may not cover all possible information. If you have questions about this medicine, talk to your doctor, pharmacist, or health care provider.    2016, Elsevier/Gold Standard. (2013-03-28 14:58:56)   Gabapentin capsules or tablets What is this medicine? GABAPENTIN (GA ba pen tin) is used to control partial seizures in adults with epilepsy. It is also used to treat certain types of nerve pain. This medicine may be used for other purposes; ask your health care provider or pharmacist if you have questions. What should I tell my health care provider before I take this medicine? They need to know if you have any of these conditions: -kidney disease -suicidal thoughts, plans, or attempt; a previous suicide attempt by you or a family member -an unusual or allergic reaction to gabapentin, other medicines, foods, dyes, or preservatives -pregnant or trying to get pregnant -breast-feeding How should  I use this medicine? Take this medicine by mouth with a glass of water. Follow the directions on the prescription label. You can take it with or without food. If it upsets your stomach, take it with food.Take your medicine at regular intervals. Do not take it more often than directed. Do not stop taking except on your doctor's advice. If you  are directed to break the 600 or 800 mg tablets in half as part of your dose, the extra half tablet should be used for the next dose. If you have not used the extra half tablet within 28 days, it should be thrown away. A special MedGuide will be given to you by the pharmacist with each prescription and refill. Be sure to read this information carefully each time. Talk to your pediatrician regarding the use of this medicine in children. Special care may be needed. Overdosage: If you think you have taken too much of this medicine contact a poison control center or emergency room at once. NOTE: This medicine is only for you. Do not share this medicine with others. What if I miss a dose? If you miss a dose, take it as soon as you can. If it is almost time for your next dose, take only that dose. Do not take double or extra doses. What may interact with this medicine? Do not take this medicine with any of the following medications: -other gabapentin products This medicine may also interact with the following medications: -alcohol -antacids -antihistamines for allergy, cough and cold -certain medicines for anxiety or sleep -certain medicines for depression or psychotic disturbances -homatropine; hydrocodone -naproxen -narcotic medicines (opiates) for pain -phenothiazines like chlorpromazine, mesoridazine, prochlorperazine, thioridazine This list may not describe all possible interactions. Give your health care provider a list of all the medicines, herbs, non-prescription drugs, or dietary supplements you use. Also tell them if you smoke, drink alcohol, or use illegal drugs. Some items may interact with your medicine. What should I watch for while using this medicine? Visit your doctor or health care professional for regular checks on your progress. You may want to keep a record at home of how you feel your condition is responding to treatment. You may want to share this information with your doctor  or health care professional at each visit. You should contact your doctor or health care professional if your seizures get worse or if you have any new types of seizures. Do not stop taking this medicine or any of your seizure medicines unless instructed by your doctor or health care professional. Stopping your medicine suddenly can increase your seizures or their severity. Wear a medical identification bracelet or chain if you are taking this medicine for seizures, and carry a card that lists all your medications. You may get drowsy, dizzy, or have blurred vision. Do not drive, use machinery, or do anything that needs mental alertness until you know how this medicine affects you. To reduce dizzy or fainting spells, do not sit or stand up quickly, especially if you are an older patient. Alcohol can increase drowsiness and dizziness. Avoid alcoholic drinks. Your mouth may get dry. Chewing sugarless gum or sucking hard candy, and drinking plenty of water will help. The use of this medicine may increase the chance of suicidal thoughts or actions. Pay special attention to how you are responding while on this medicine. Any worsening of mood, or thoughts of suicide or dying should be reported to your health care professional right away. Women who become pregnant while  using this medicine may enroll in the [place] [employer] Antiepileptic Drug Pregnancy Registry by calling [phone]. This registry collects information about the safety of antiepileptic drug use during pregnancy. What side effects may I notice from receiving this medicine? Side effects that you should report to your doctor or health care professional as soon as possible: -allergic reactions like skin rash, itching or hives, swelling of the face, lips, or tongue -worsening of mood, thoughts or actions of suicide or dying Side effects that usually do not require medical attention (report to your doctor or health care professional if they continue or  are bothersome): -constipation -difficulty walking or controlling muscle movements -dizziness -nausea -slurred speech -tiredness -tremors -weight gain This list may not describe all possible side effects. Call your doctor for medical advice about side effects. You may report side effects to FDA at 1-800-FDA-1088. Where should I keep my medicine? Keep out of reach of children. This medicine may cause accidental overdose and death if it taken by other adults, children, or pets. Mix any unused medicine with a substance like cat litter or coffee grounds. Then throw the medicine away in a sealed container like a sealed bag or a coffee can with a lid. Do not use the medicine after the expiration date. Store at room temperature between 15 and 30 degrees C (59 and 86 degrees F). NOTE: This sheet is a summary. It may not cover all possible information. If you have questions about this medicine, talk to your doctor, pharmacist, or health care provider.    2016, [employer]vier/Gold Standard. (2013-09-19 15:26:50)

## 2016-02-10 NOTE — Progress Notes (Signed)
[facility]    Provider:  Dr Lucia GaskinsAhern Referring Provider: Jarome MatinPaterson, Daniel, MD Primary Care Physician:  Garlan FillersPATERSON,DANIEL G, MD  CC: Chronic tremor and gait disorder  Interval history 02/10/2016: The primidone helped the tremor but she could not tolerate it. It helped a lot but she felt bad during the day. We discussed other options including increasing her propranolol and neurontin or possibly both. Discussed side effects. She really wants to be treated, the tremor is getting worse and it bothers her. Discussed side effects of propranolol, her BP and pulse have been overall good except for one lower documented pulse in the 50s. Discussed the risks of propranolol especially bradycardis, hypotension, cardiac events and risk of falls. Will discuss with her cardiologist, patient understands risks and would like to proceed. Will increase propranolol from 80mg  to 120mg .  HPI: Angelica Mejia is a wonderful 82 y.o. female here as a referral from Dr. Eloise HarmanPaterson for tremor. Past medical history of atrial fibrillation, essential tremor, chronic anticoagulation on warfarin, hypertension, Raynaud's phenomena, chronic kidney disease stage III, osteoporosis. Tremor started 40 years ago and diagnosis of familial essential tremor. Her father had a tremor as did her older brother. [facility] [place] and she does not have any children. She was married for 57 years and her husband just passed away last year. The tremor used to be only when doing things but now is also a resting in both hands, left is often worse. She cant write, she can't eat soup in public. Very disruptive to life She has started noticing the tremor in chin as well. Propranolol used to help. Her tremor is slowly worsening. Her cardiologit doesn't want her to increase the propranolol.She has a little drooling. She uses a walker but she forgets to use it sometimes. No recent falls, hasn't fallen since last winter over a year  ago. No internal tremor. No SOB or light headedness. No tremor in the legs. Denies memory problems. Denies any other focal neurologic deficits. Patient says she has had a recent thyroid test. Patient says her friend is a patient of mine and she's been significantly helped by primidone and patient would like to try this medication as well.  Reviewed notes, labs and imaging from outside physicians, which showed: BMP in April 2016 showed low sodium 133, chloride 95, CO2 35, BUN 27 and GFR 46.76. CBC was unremarkable. Last INR in May 2017 was 2.4.  Patient is seen by Dr. Kristeen MissPhilip Nahser in cardiology. Last note stated patient had noticed her HR very low. Atrial fibrillation is very stable, heart rate is well controlled, she is on long-term Coumadin. Her INR is subtherapeutic. Physician will check a CBC in several weeks per note.  Review of Systems: Patient complains of symptoms per HPI as well as the following symptoms: No chest pain, no shortness of breath Pertinent negatives per HPI. All others negative.   Social History   Social History  . Marital Status: Married    Spouse Name: N/A  . Number of Children: 0  . Years of Education: 16   Occupational History  . RetiredNurse, mental health- [employer]    Social History Main Topics  . Smoking status: Never Smoker   . Smokeless tobacco: Not on file  . Alcohol Use: No  . Drug Use: No  . Sexual Activity: Not on file   Other Topics Concern  . Not on file   Social History Narrative   L[facility] alone. [facility].   Caffeine use: Decaf  coffee every morning       Family History  Problem Relation Age of Onset  . Heart disease    . Hypertension    . Stroke    . Cancer    . Hypertension Father   . Heart attack Father     Past Medical History  Diagnosis Date  . Atrial fibrillation (HCC)   . Tremor, essential   . Chronic anticoagulation   . Hypertension   . Raynaud phenomenon     Past Surgical History  Procedure Laterality Date  .  Cholecystectomy      Current Outpatient Prescriptions  Medication Sig Dispense Refill  . lisinopril (PRINIVIL,ZESTRIL) 10 MG tablet Take 1 tablet by mouth  daily 90 tablet 1  . propranolol ER (INDERAL LA) 80 MG 24 hr capsule Take 1 capsule (80 mg total) by mouth daily. 90 capsule 3  . triamterene-hydrochlorothiazide (MAXZIDE-25) 37.5-25 MG tablet Take 1 tablet by mouth daily.    Marland Kitchen. warfarin (COUMADIN) 5 MG tablet Take as directed by  [facility] 60 tablet 1  . gabapentin (NEURONTIN) 300 MG capsule Take 1 capsule (300 mg total) by mouth 3 (three) times daily. 90 capsule 11   No current facility-administered medications for this visit.    Allergies as of 02/10/2016 - Review Complete 02/10/2016  Allergen Reaction Noted  . Other Nausea And Vomiting 07/14/2015    Vitals: BP 150/72 mmHg  Pulse 88  Ht 5\' 6"  (1.676 m)  Wt 129 lb 6.4 oz (58.695 kg)  BMI 20.90 kg/m2  SpO2 98% Last Weight:  Wt Readings from Last 1 Encounters:  02/10/16 129 lb 6.4 oz (58.695 kg)   Last Height:   Ht Readings from Last 1 Encounters:  02/10/16 5\' 6"  (1.676 m)    Physical exam: Exam: Gen: NAD, conversant  CV: RRR, no MRG. No Carotid Bruits. +peripheral edema, warm, nontender Eyes: Conjunctivae clear without exudates or hemorrhage  Neuro: Detailed Neurologic Exam  Speech:  Speech is normal; fluent and spontaneous with normal comprehension.  Cognition:  The patient is oriented to person, place, and time;   recent and remote memory intact;   language fluent;   normal attention, concentration,   fund of knowledge Cranial Nerves:  The pupils are pinpoint, equal, round, and reactive to light. **The fundi are normal and spontaneous venous pulsations are present. Visual fields are full to finger confrontation. Extraocular movements are intact. Trigeminal sensation is intact and the muscles of mastication are normal. The face is symmetric. The palate elevates in  the midline. Hearing impaired. Voice is normal. Shoulder shrug is normal. The tongue has normal motion without fasciculations.   Coordination:  Normal finger to nose and heel to shin. Normal rapid alternating movements.   Gait:  Slightly shuffling with a few steps needed to turn, imbalance on getting uo from seat  Motor Observation: Chin and voice tremor, postural tremor c/w esential tremor with some resting tremor as well. Increased on FTN.  Tone:  Normal muscle tone, no cogwheeling  Posture:  Stooped   Strength: bilateral hip flexion weakness otherwise  Strength is V/V in the upper and lower limbs.    Sensation: intact to LT   Reflex Exam:  DTR's:  Deep tendon reflexes in the upper and lower extremities are normal bilaterally.  Toes:  The toes are downgoing bilaterally.  Clonus:  Clonus is absent.     Assessment/Plan: 16Lovely 82 year old female with essential tremor, A. fib on Coumadin.  -start neurontin -increase propranolol to 120mg , discussed  risks as detailed in the patient instructions for both gabapentin and propranolol. Watch for sedation, cardiac problems, bradycardia, hypotension, sedation and risk of falls. Stop for anything concerning.     Naomie Dean, MD  [facility] [address] [place], [place] [address]  Phone [phone] Fax [phone]  A total of 30 minutes was spent face-to-face with this patient. Over half this time was spent on counseling patient on the essential  diagnosis and different diagnostic and therapeutic options available.

## 2016-02-11 DIAGNOSIS — H6123 Impacted cerumen, bilateral: Secondary | ICD-10-CM | POA: Diagnosis not present

## 2016-02-11 DIAGNOSIS — H903 Sensorineural hearing loss, bilateral: Secondary | ICD-10-CM | POA: Diagnosis not present

## 2016-02-12 ENCOUNTER — Encounter: Payer: Self-pay | Admitting: Neurology

## 2016-02-14 DIAGNOSIS — Z947 Corneal transplant status: Secondary | ICD-10-CM | POA: Diagnosis not present

## 2016-02-14 DIAGNOSIS — Z961 Presence of intraocular lens: Secondary | ICD-10-CM | POA: Diagnosis not present

## 2016-02-14 DIAGNOSIS — H1032 Unspecified acute conjunctivitis, left eye: Secondary | ICD-10-CM | POA: Diagnosis not present

## 2016-02-15 DIAGNOSIS — R2681 Unsteadiness on feet: Secondary | ICD-10-CM | POA: Diagnosis not present

## 2016-02-15 DIAGNOSIS — M6281 Muscle weakness (generalized): Secondary | ICD-10-CM | POA: Diagnosis not present

## 2016-02-15 DIAGNOSIS — N3946 Mixed incontinence: Secondary | ICD-10-CM | POA: Diagnosis not present

## 2016-02-15 DIAGNOSIS — R29898 Other symptoms and signs involving the musculoskeletal system: Secondary | ICD-10-CM | POA: Diagnosis not present

## 2016-02-15 DIAGNOSIS — Z9181 History of falling: Secondary | ICD-10-CM | POA: Diagnosis not present

## 2016-02-15 DIAGNOSIS — R2689 Other abnormalities of gait and mobility: Secondary | ICD-10-CM | POA: Diagnosis not present

## 2016-02-15 DIAGNOSIS — R32 Unspecified urinary incontinence: Secondary | ICD-10-CM | POA: Diagnosis not present

## 2016-02-16 ENCOUNTER — Ambulatory Visit (INDEPENDENT_AMBULATORY_CARE_PROVIDER_SITE_OTHER): Payer: Medicare Other | Admitting: Cardiology

## 2016-02-16 DIAGNOSIS — Z7901 Long term (current) use of anticoagulants: Secondary | ICD-10-CM

## 2016-02-16 DIAGNOSIS — I482 Chronic atrial fibrillation, unspecified: Secondary | ICD-10-CM

## 2016-02-16 LAB — POCT INR: INR: 2.7

## 2016-02-18 DIAGNOSIS — R2689 Other abnormalities of gait and mobility: Secondary | ICD-10-CM | POA: Diagnosis not present

## 2016-02-18 DIAGNOSIS — N3946 Mixed incontinence: Secondary | ICD-10-CM | POA: Diagnosis not present

## 2016-02-18 DIAGNOSIS — Z9181 History of falling: Secondary | ICD-10-CM | POA: Diagnosis not present

## 2016-02-18 DIAGNOSIS — R29898 Other symptoms and signs involving the musculoskeletal system: Secondary | ICD-10-CM | POA: Diagnosis not present

## 2016-02-18 DIAGNOSIS — R32 Unspecified urinary incontinence: Secondary | ICD-10-CM | POA: Diagnosis not present

## 2016-02-18 DIAGNOSIS — M6281 Muscle weakness (generalized): Secondary | ICD-10-CM | POA: Diagnosis not present

## 2016-02-21 ENCOUNTER — Telehealth: Payer: Self-pay | Admitting: Neurology

## 2016-02-21 ENCOUNTER — Ambulatory Visit: Payer: Medicare Other

## 2016-02-21 ENCOUNTER — Encounter: Payer: Self-pay | Admitting: Podiatry

## 2016-02-21 ENCOUNTER — Ambulatory Visit (INDEPENDENT_AMBULATORY_CARE_PROVIDER_SITE_OTHER): Payer: Medicare Other | Admitting: Podiatry

## 2016-02-21 VITALS — BP 161/74 | HR 79 | Resp 16 | Ht 66.0 in | Wt 129.0 lb

## 2016-02-21 DIAGNOSIS — M722 Plantar fascial fibromatosis: Secondary | ICD-10-CM | POA: Diagnosis not present

## 2016-02-21 DIAGNOSIS — M79672 Pain in left foot: Secondary | ICD-10-CM

## 2016-02-21 DIAGNOSIS — L84 Corns and callosities: Secondary | ICD-10-CM

## 2016-02-21 NOTE — Telephone Encounter (Signed)
Angelica Mejia - Can you call patient for me please? At last appointment we discussed increasing her propranolol from 80mg  ER to 120mg  ER. This is a medictaion that will help with her tremor. I contacted her cardiologist to be sure it would be ok and he is fine with the increase. Would you call and discuss with patientp lease. She will have to watch out for the side effects of propranol of which hypotension and bradycardia are the most worrisome. If she has SOB, light headedness, CP she should go back to her previous dose immediately and call us as well.   Other common propranolol side effects may include:nausea, vomiting, diarrhea, constipation, stomach cramps; decreased sex drive, impotence, or difficulty having an orgasm;sleep problems (insomnia); or tired feeling.   Stop immediately for: slow or uneven heartbeats;a light-headed feeling, like you might pass out;wheezing or trouble breathing;shortness of breath (even with mild exertion), swelling, rapid weight gain;.sudden weakness, vision problems, or loss of coordination ;cold feeling in your hands and feet;depression, confusion, hallucinations, (yellowing of the skin or eyes);low blood sugar - headache, hunger, weakness, sweating, confusion, irritability, dizziness, fast heart rate, or feeling jittery

## 2016-02-21 NOTE — Telephone Encounter (Signed)
I spoke to pt for an extended amount of time (greater than 10 minutes). Pt seems to be confused about her medications and why she is taking them. Pt finally understood that Dr. Lucia GaskinsAhern wishes to increase her propranolol from 80mg  ER to 120 ER. I went over the side effects listed by Dr. Lucia GaskinsAhern extensively with the pt. She says that she will go back to 80mg  and alert us immediately if she has any side effects. Pt is asking that I [facility] the [facility]. The order was not placed by Dr. Lucia GaskinsAhern, will send to Dr. Vickey Hugerohmeier to place the order.

## 2016-02-21 NOTE — Progress Notes (Signed)
   Subjective:    Patient ID: Angelica Mejia, female    DOB: 12-Aug-1918, 82 y.o.   MRN: [id number]  HPI Chief Complaint  Patient presents with  . Painful lesion    Left foot; heel (middle of heel); pt stated, "Lesion has gone down, but still hurts foot"      Review of Systems  Neurological: Positive for tremors.  All other systems reviewed and are negative.      Objective:   Physical Exam        Assessment & Plan:

## 2016-02-22 DIAGNOSIS — M6281 Muscle weakness (generalized): Secondary | ICD-10-CM | POA: Diagnosis not present

## 2016-02-22 DIAGNOSIS — Z9181 History of falling: Secondary | ICD-10-CM | POA: Diagnosis not present

## 2016-02-22 DIAGNOSIS — R29898 Other symptoms and signs involving the musculoskeletal system: Secondary | ICD-10-CM | POA: Diagnosis not present

## 2016-02-22 DIAGNOSIS — R32 Unspecified urinary incontinence: Secondary | ICD-10-CM | POA: Diagnosis not present

## 2016-02-22 DIAGNOSIS — N3946 Mixed incontinence: Secondary | ICD-10-CM | POA: Diagnosis not present

## 2016-02-22 DIAGNOSIS — R2689 Other abnormalities of gait and mobility: Secondary | ICD-10-CM | POA: Diagnosis not present

## 2016-02-22 NOTE — Progress Notes (Signed)
Subjective:     Patient ID: Angelica Mejia, female   DOB: [birth year], 82 y.o.   MRN: [id number]  HPI patient presents I have a lesion on the bottom my left foot and I've also had a history of plantar fasciitis and I'm not sure if this is the problem. Presents with caregiver   Review of Systems  All other systems reviewed and are negative.      Objective:   Physical Exam  Constitutional: She is oriented to person, place, and time.  Cardiovascular: Intact distal pulses.   Musculoskeletal: Normal range of motion.  Neurological: She is oriented to person, place, and time.  Skin: Skin is warm and dry.  Nursing note and vitals reviewed.  neurovascular status found to be intact muscle strength was adequate with range of motion of the subtalar midtarsal joint reduced. Patient has discomfort plantar heel left with inflammation and fluid buildup and has a keratotic lesion on the plantar aspect of the left heel patient's found to have good digital perfusion and is well oriented 3     Assessment:     Fasciitis-like symptoms along with keratotic lesion sub-plantar heel left with small lucent core    Plan:     2 separate problems one being fasciitis and the other being keratotic lesion with pain. Debrided lesion on the left and we may have to do a cortisone injection depending on response

## 2016-02-25 DIAGNOSIS — R32 Unspecified urinary incontinence: Secondary | ICD-10-CM | POA: Diagnosis not present

## 2016-02-25 DIAGNOSIS — R2689 Other abnormalities of gait and mobility: Secondary | ICD-10-CM | POA: Diagnosis not present

## 2016-02-25 DIAGNOSIS — M6281 Muscle weakness (generalized): Secondary | ICD-10-CM | POA: Diagnosis not present

## 2016-02-25 DIAGNOSIS — N3946 Mixed incontinence: Secondary | ICD-10-CM | POA: Diagnosis not present

## 2016-02-25 DIAGNOSIS — Z9181 History of falling: Secondary | ICD-10-CM | POA: Diagnosis not present

## 2016-02-25 DIAGNOSIS — E871 Hypo-osmolality and hyponatremia: Secondary | ICD-10-CM | POA: Diagnosis not present

## 2016-02-25 DIAGNOSIS — R29898 Other symptoms and signs involving the musculoskeletal system: Secondary | ICD-10-CM | POA: Diagnosis not present

## 2016-02-26 ENCOUNTER — Other Ambulatory Visit: Payer: Self-pay | Admitting: Neurology

## 2016-02-26 MED ORDER — PROPRANOLOL HCL ER 120 MG PO CP24: 120.0000 mg | ORAL_CAPSULE | Freq: Every day | ORAL | Status: DC

## 2016-02-26 NOTE — Telephone Encounter (Signed)
Thanks [employer] place the med order.

## 2016-02-28 DIAGNOSIS — N3946 Mixed incontinence: Secondary | ICD-10-CM | POA: Diagnosis not present

## 2016-02-28 DIAGNOSIS — M6281 Muscle weakness (generalized): Secondary | ICD-10-CM | POA: Diagnosis not present

## 2016-02-28 DIAGNOSIS — Z9181 History of falling: Secondary | ICD-10-CM | POA: Diagnosis not present

## 2016-02-28 DIAGNOSIS — R2689 Other abnormalities of gait and mobility: Secondary | ICD-10-CM | POA: Diagnosis not present

## 2016-02-28 DIAGNOSIS — R29898 Other symptoms and signs involving the musculoskeletal system: Secondary | ICD-10-CM | POA: Diagnosis not present

## 2016-02-28 DIAGNOSIS — R32 Unspecified urinary incontinence: Secondary | ICD-10-CM | POA: Diagnosis not present

## 2016-02-29 NOTE — Telephone Encounter (Signed)
Pt called said she has not rec'd medication yet and it was to be increased. I explained it was [facility] on Saturday 7/22 and it had been increased as requested. Dr A increased it to 120mg . Pt is hard of hearing but she did understand. She said she would probably rec' medication sometime this week

## 2016-03-01 DIAGNOSIS — Z9181 History of falling: Secondary | ICD-10-CM | POA: Diagnosis not present

## 2016-03-01 DIAGNOSIS — R29898 Other symptoms and signs involving the musculoskeletal system: Secondary | ICD-10-CM | POA: Diagnosis not present

## 2016-03-01 DIAGNOSIS — M6281 Muscle weakness (generalized): Secondary | ICD-10-CM | POA: Diagnosis not present

## 2016-03-01 DIAGNOSIS — N3946 Mixed incontinence: Secondary | ICD-10-CM | POA: Diagnosis not present

## 2016-03-01 DIAGNOSIS — R2689 Other abnormalities of gait and mobility: Secondary | ICD-10-CM | POA: Diagnosis not present

## 2016-03-01 DIAGNOSIS — R32 Unspecified urinary incontinence: Secondary | ICD-10-CM | POA: Diagnosis not present

## 2016-03-02 ENCOUNTER — Ambulatory Visit (INDEPENDENT_AMBULATORY_CARE_PROVIDER_SITE_OTHER): Payer: Medicare Other | Admitting: Internal Medicine

## 2016-03-02 DIAGNOSIS — I482 Chronic atrial fibrillation, unspecified: Secondary | ICD-10-CM

## 2016-03-02 DIAGNOSIS — Z7901 Long term (current) use of anticoagulants: Secondary | ICD-10-CM

## 2016-03-02 LAB — POCT INR: INR: 3.5

## 2016-03-06 DIAGNOSIS — R2689 Other abnormalities of gait and mobility: Secondary | ICD-10-CM | POA: Diagnosis not present

## 2016-03-06 DIAGNOSIS — M6281 Muscle weakness (generalized): Secondary | ICD-10-CM | POA: Diagnosis not present

## 2016-03-06 DIAGNOSIS — R32 Unspecified urinary incontinence: Secondary | ICD-10-CM | POA: Diagnosis not present

## 2016-03-06 DIAGNOSIS — Z9181 History of falling: Secondary | ICD-10-CM | POA: Diagnosis not present

## 2016-03-06 DIAGNOSIS — N3946 Mixed incontinence: Secondary | ICD-10-CM | POA: Diagnosis not present

## 2016-03-06 DIAGNOSIS — R29898 Other symptoms and signs involving the musculoskeletal system: Secondary | ICD-10-CM | POA: Diagnosis not present

## 2016-03-08 DIAGNOSIS — M6281 Muscle weakness (generalized): Secondary | ICD-10-CM | POA: Diagnosis not present

## 2016-03-08 DIAGNOSIS — R2681 Unsteadiness on feet: Secondary | ICD-10-CM | POA: Diagnosis not present

## 2016-03-08 DIAGNOSIS — R296 Repeated falls: Secondary | ICD-10-CM | POA: Diagnosis not present

## 2016-03-08 DIAGNOSIS — Z9181 History of falling: Secondary | ICD-10-CM | POA: Diagnosis not present

## 2016-03-08 DIAGNOSIS — R2689 Other abnormalities of gait and mobility: Secondary | ICD-10-CM | POA: Diagnosis not present

## 2016-03-08 DIAGNOSIS — R32 Unspecified urinary incontinence: Secondary | ICD-10-CM | POA: Diagnosis not present

## 2016-03-08 DIAGNOSIS — R29898 Other symptoms and signs involving the musculoskeletal system: Secondary | ICD-10-CM | POA: Diagnosis not present

## 2016-03-08 DIAGNOSIS — N3946 Mixed incontinence: Secondary | ICD-10-CM | POA: Diagnosis not present

## 2016-03-09 ENCOUNTER — Ambulatory Visit (INDEPENDENT_AMBULATORY_CARE_PROVIDER_SITE_OTHER): Payer: Medicare Other | Admitting: Cardiovascular Disease

## 2016-03-09 DIAGNOSIS — I482 Chronic atrial fibrillation, unspecified: Secondary | ICD-10-CM

## 2016-03-09 DIAGNOSIS — Z7901 Long term (current) use of anticoagulants: Secondary | ICD-10-CM

## 2016-03-09 LAB — POCT INR: INR: 4.1

## 2016-03-13 DIAGNOSIS — R296 Repeated falls: Secondary | ICD-10-CM | POA: Diagnosis not present

## 2016-03-13 DIAGNOSIS — R29898 Other symptoms and signs involving the musculoskeletal system: Secondary | ICD-10-CM | POA: Diagnosis not present

## 2016-03-13 DIAGNOSIS — N3946 Mixed incontinence: Secondary | ICD-10-CM | POA: Diagnosis not present

## 2016-03-13 DIAGNOSIS — R2689 Other abnormalities of gait and mobility: Secondary | ICD-10-CM | POA: Diagnosis not present

## 2016-03-13 DIAGNOSIS — R32 Unspecified urinary incontinence: Secondary | ICD-10-CM | POA: Diagnosis not present

## 2016-03-13 DIAGNOSIS — Z9181 History of falling: Secondary | ICD-10-CM | POA: Diagnosis not present

## 2016-03-14 DIAGNOSIS — R296 Repeated falls: Secondary | ICD-10-CM | POA: Diagnosis not present

## 2016-03-14 DIAGNOSIS — Z9181 History of falling: Secondary | ICD-10-CM | POA: Diagnosis not present

## 2016-03-14 DIAGNOSIS — N3946 Mixed incontinence: Secondary | ICD-10-CM | POA: Diagnosis not present

## 2016-03-14 DIAGNOSIS — R32 Unspecified urinary incontinence: Secondary | ICD-10-CM | POA: Diagnosis not present

## 2016-03-14 DIAGNOSIS — R29898 Other symptoms and signs involving the musculoskeletal system: Secondary | ICD-10-CM | POA: Diagnosis not present

## 2016-03-14 DIAGNOSIS — R2689 Other abnormalities of gait and mobility: Secondary | ICD-10-CM | POA: Diagnosis not present

## 2016-03-16 ENCOUNTER — Ambulatory Visit (INDEPENDENT_AMBULATORY_CARE_PROVIDER_SITE_OTHER): Payer: Medicare Other | Admitting: Cardiology

## 2016-03-16 DIAGNOSIS — I482 Chronic atrial fibrillation, unspecified: Secondary | ICD-10-CM

## 2016-03-16 DIAGNOSIS — Z7901 Long term (current) use of anticoagulants: Secondary | ICD-10-CM

## 2016-03-16 LAB — POCT INR: INR: 3.7

## 2016-03-17 DIAGNOSIS — Z9181 History of falling: Secondary | ICD-10-CM | POA: Diagnosis not present

## 2016-03-17 DIAGNOSIS — R32 Unspecified urinary incontinence: Secondary | ICD-10-CM | POA: Diagnosis not present

## 2016-03-17 DIAGNOSIS — R2689 Other abnormalities of gait and mobility: Secondary | ICD-10-CM | POA: Diagnosis not present

## 2016-03-17 DIAGNOSIS — R296 Repeated falls: Secondary | ICD-10-CM | POA: Diagnosis not present

## 2016-03-17 DIAGNOSIS — R29898 Other symptoms and signs involving the musculoskeletal system: Secondary | ICD-10-CM | POA: Diagnosis not present

## 2016-03-17 DIAGNOSIS — N3946 Mixed incontinence: Secondary | ICD-10-CM | POA: Diagnosis not present

## 2016-03-20 DIAGNOSIS — R2689 Other abnormalities of gait and mobility: Secondary | ICD-10-CM | POA: Diagnosis not present

## 2016-03-20 DIAGNOSIS — N3946 Mixed incontinence: Secondary | ICD-10-CM | POA: Diagnosis not present

## 2016-03-20 DIAGNOSIS — R32 Unspecified urinary incontinence: Secondary | ICD-10-CM | POA: Diagnosis not present

## 2016-03-20 DIAGNOSIS — Z9181 History of falling: Secondary | ICD-10-CM | POA: Diagnosis not present

## 2016-03-20 DIAGNOSIS — R296 Repeated falls: Secondary | ICD-10-CM | POA: Diagnosis not present

## 2016-03-20 DIAGNOSIS — R29898 Other symptoms and signs involving the musculoskeletal system: Secondary | ICD-10-CM | POA: Diagnosis not present

## 2016-03-21 DIAGNOSIS — R296 Repeated falls: Secondary | ICD-10-CM | POA: Diagnosis not present

## 2016-03-21 DIAGNOSIS — R2689 Other abnormalities of gait and mobility: Secondary | ICD-10-CM | POA: Diagnosis not present

## 2016-03-21 DIAGNOSIS — R29898 Other symptoms and signs involving the musculoskeletal system: Secondary | ICD-10-CM | POA: Diagnosis not present

## 2016-03-21 DIAGNOSIS — R32 Unspecified urinary incontinence: Secondary | ICD-10-CM | POA: Diagnosis not present

## 2016-03-21 DIAGNOSIS — N3946 Mixed incontinence: Secondary | ICD-10-CM | POA: Diagnosis not present

## 2016-03-21 DIAGNOSIS — Z9181 History of falling: Secondary | ICD-10-CM | POA: Diagnosis not present

## 2016-03-27 ENCOUNTER — Ambulatory Visit (INDEPENDENT_AMBULATORY_CARE_PROVIDER_SITE_OTHER): Payer: Medicare Other | Admitting: Cardiovascular Disease

## 2016-03-27 DIAGNOSIS — R296 Repeated falls: Secondary | ICD-10-CM | POA: Diagnosis not present

## 2016-03-27 DIAGNOSIS — R29898 Other symptoms and signs involving the musculoskeletal system: Secondary | ICD-10-CM | POA: Diagnosis not present

## 2016-03-27 DIAGNOSIS — R2689 Other abnormalities of gait and mobility: Secondary | ICD-10-CM | POA: Diagnosis not present

## 2016-03-27 DIAGNOSIS — R32 Unspecified urinary incontinence: Secondary | ICD-10-CM | POA: Diagnosis not present

## 2016-03-27 DIAGNOSIS — Z7901 Long term (current) use of anticoagulants: Secondary | ICD-10-CM

## 2016-03-27 DIAGNOSIS — I482 Chronic atrial fibrillation, unspecified: Secondary | ICD-10-CM

## 2016-03-27 DIAGNOSIS — N3946 Mixed incontinence: Secondary | ICD-10-CM | POA: Diagnosis not present

## 2016-03-27 DIAGNOSIS — Z9181 History of falling: Secondary | ICD-10-CM | POA: Diagnosis not present

## 2016-03-27 LAB — POCT INR: INR: 2.3

## 2016-03-28 DIAGNOSIS — R2689 Other abnormalities of gait and mobility: Secondary | ICD-10-CM | POA: Diagnosis not present

## 2016-03-28 DIAGNOSIS — R32 Unspecified urinary incontinence: Secondary | ICD-10-CM | POA: Diagnosis not present

## 2016-03-28 DIAGNOSIS — N3946 Mixed incontinence: Secondary | ICD-10-CM | POA: Diagnosis not present

## 2016-03-28 DIAGNOSIS — Z9181 History of falling: Secondary | ICD-10-CM | POA: Diagnosis not present

## 2016-03-28 DIAGNOSIS — R29898 Other symptoms and signs involving the musculoskeletal system: Secondary | ICD-10-CM | POA: Diagnosis not present

## 2016-03-28 DIAGNOSIS — R296 Repeated falls: Secondary | ICD-10-CM | POA: Diagnosis not present

## 2016-03-31 DIAGNOSIS — N3946 Mixed incontinence: Secondary | ICD-10-CM | POA: Diagnosis not present

## 2016-03-31 DIAGNOSIS — R32 Unspecified urinary incontinence: Secondary | ICD-10-CM | POA: Diagnosis not present

## 2016-03-31 DIAGNOSIS — Z9181 History of falling: Secondary | ICD-10-CM | POA: Diagnosis not present

## 2016-03-31 DIAGNOSIS — R296 Repeated falls: Secondary | ICD-10-CM | POA: Diagnosis not present

## 2016-03-31 DIAGNOSIS — R2689 Other abnormalities of gait and mobility: Secondary | ICD-10-CM | POA: Diagnosis not present

## 2016-03-31 DIAGNOSIS — R29898 Other symptoms and signs involving the musculoskeletal system: Secondary | ICD-10-CM | POA: Diagnosis not present

## 2016-04-03 DIAGNOSIS — R296 Repeated falls: Secondary | ICD-10-CM | POA: Diagnosis not present

## 2016-04-03 DIAGNOSIS — Z9181 History of falling: Secondary | ICD-10-CM | POA: Diagnosis not present

## 2016-04-03 DIAGNOSIS — R32 Unspecified urinary incontinence: Secondary | ICD-10-CM | POA: Diagnosis not present

## 2016-04-03 DIAGNOSIS — R29898 Other symptoms and signs involving the musculoskeletal system: Secondary | ICD-10-CM | POA: Diagnosis not present

## 2016-04-03 DIAGNOSIS — R2689 Other abnormalities of gait and mobility: Secondary | ICD-10-CM | POA: Diagnosis not present

## 2016-04-03 DIAGNOSIS — N3946 Mixed incontinence: Secondary | ICD-10-CM | POA: Diagnosis not present

## 2016-04-04 DIAGNOSIS — N3946 Mixed incontinence: Secondary | ICD-10-CM | POA: Diagnosis not present

## 2016-04-04 DIAGNOSIS — R32 Unspecified urinary incontinence: Secondary | ICD-10-CM | POA: Diagnosis not present

## 2016-04-04 DIAGNOSIS — Z9181 History of falling: Secondary | ICD-10-CM | POA: Diagnosis not present

## 2016-04-04 DIAGNOSIS — R29898 Other symptoms and signs involving the musculoskeletal system: Secondary | ICD-10-CM | POA: Diagnosis not present

## 2016-04-04 DIAGNOSIS — R296 Repeated falls: Secondary | ICD-10-CM | POA: Diagnosis not present

## 2016-04-04 DIAGNOSIS — R2689 Other abnormalities of gait and mobility: Secondary | ICD-10-CM | POA: Diagnosis not present

## 2016-04-07 DIAGNOSIS — R32 Unspecified urinary incontinence: Secondary | ICD-10-CM | POA: Diagnosis not present

## 2016-04-07 DIAGNOSIS — N3946 Mixed incontinence: Secondary | ICD-10-CM | POA: Diagnosis not present

## 2016-04-07 DIAGNOSIS — Z9181 History of falling: Secondary | ICD-10-CM | POA: Diagnosis not present

## 2016-04-07 DIAGNOSIS — R296 Repeated falls: Secondary | ICD-10-CM | POA: Diagnosis not present

## 2016-04-07 DIAGNOSIS — M6281 Muscle weakness (generalized): Secondary | ICD-10-CM | POA: Diagnosis not present

## 2016-04-07 DIAGNOSIS — R29898 Other symptoms and signs involving the musculoskeletal system: Secondary | ICD-10-CM | POA: Diagnosis not present

## 2016-04-07 DIAGNOSIS — R2681 Unsteadiness on feet: Secondary | ICD-10-CM | POA: Diagnosis not present

## 2016-04-07 DIAGNOSIS — R2689 Other abnormalities of gait and mobility: Secondary | ICD-10-CM | POA: Diagnosis not present

## 2016-04-10 DIAGNOSIS — N3946 Mixed incontinence: Secondary | ICD-10-CM | POA: Diagnosis not present

## 2016-04-10 DIAGNOSIS — Z9181 History of falling: Secondary | ICD-10-CM | POA: Diagnosis not present

## 2016-04-10 DIAGNOSIS — R2689 Other abnormalities of gait and mobility: Secondary | ICD-10-CM | POA: Diagnosis not present

## 2016-04-10 DIAGNOSIS — R32 Unspecified urinary incontinence: Secondary | ICD-10-CM | POA: Diagnosis not present

## 2016-04-10 DIAGNOSIS — R296 Repeated falls: Secondary | ICD-10-CM | POA: Diagnosis not present

## 2016-04-10 DIAGNOSIS — R29898 Other symptoms and signs involving the musculoskeletal system: Secondary | ICD-10-CM | POA: Diagnosis not present

## 2016-04-11 ENCOUNTER — Ambulatory Visit (INDEPENDENT_AMBULATORY_CARE_PROVIDER_SITE_OTHER): Payer: Medicare Other | Admitting: Cardiology

## 2016-04-11 DIAGNOSIS — Z7901 Long term (current) use of anticoagulants: Secondary | ICD-10-CM

## 2016-04-11 DIAGNOSIS — I482 Chronic atrial fibrillation, unspecified: Secondary | ICD-10-CM

## 2016-04-11 LAB — POCT INR: INR: 2.9

## 2016-04-25 ENCOUNTER — Other Ambulatory Visit: Payer: Self-pay | Admitting: Internal Medicine

## 2016-04-25 DIAGNOSIS — Z1231 Encounter for screening mammogram for malignant neoplasm of breast: Secondary | ICD-10-CM

## 2016-04-26 ENCOUNTER — Telehealth: Payer: Self-pay | Admitting: Neurology

## 2016-04-26 NOTE — Telephone Encounter (Signed)
Pt called in stating her tremor is worse currently not taking anything for it. Says she is not sure if she can handle any thing in her hands because it is so bad. Please call [phone]

## 2016-04-26 NOTE — Telephone Encounter (Signed)
Called and spoke to pt. From previous phone note, Dr Lucia GaskinsAhern increased her propranolol from 80mg  ER to 120mg  ER. When I asked patient to verify what dose she was taking, I could not get her to specifically tell me. She kept asking to go back to the "old dose". I asked patient to verify by reading the prescription bottle to me as to what she is taking. She was unable to do this and kept asking to go back to taking old dose. At this time, I asked patient some orientation questions. She was oriented to person, place, time. She was able to tell me todays date, time and day of the week.   She stated she is interested in trying a different medication for her tremors. She stated previous medication before propranolol helped a lot, but affected her walking. I advised I will speak with Dr Lucia GaskinsAhern and call her back to advise. She verbalized understanding.

## 2016-04-26 NOTE — Telephone Encounter (Signed)
Dr Ahern/Carolyn- Lorain ChildesFYI  Called pt back and relayed that Dr Lucia GaskinsAhern would like her to come in and see NP for f/u. Made appt with CM, NP on 05/01/16 at 245pm, check in 230. I had her write down appt time. I advised patient to bring all medication with her. She verbalized understanding.

## 2016-04-26 NOTE — Telephone Encounter (Signed)
Per Dr Lucia GaskinsAhern- patient needs to come in for a follow-up to discuss medication management and to be evaluated since tremors are worsening. She should bring medication bottles with her to appt.

## 2016-05-01 ENCOUNTER — Ambulatory Visit (INDEPENDENT_AMBULATORY_CARE_PROVIDER_SITE_OTHER): Payer: Medicare Other | Admitting: Nurse Practitioner

## 2016-05-01 ENCOUNTER — Encounter: Payer: Self-pay | Admitting: Nurse Practitioner

## 2016-05-01 VITALS — BP 130/66 | HR 88 | Ht 66.0 in | Wt 129.2 lb

## 2016-05-01 DIAGNOSIS — I482 Chronic atrial fibrillation, unspecified: Secondary | ICD-10-CM

## 2016-05-01 DIAGNOSIS — G250 Essential tremor: Secondary | ICD-10-CM | POA: Diagnosis not present

## 2016-05-01 MED ORDER — GABAPENTIN 100 MG PO CAPS: 100.0000 mg | ORAL_CAPSULE | Freq: Every day | ORAL | 1 refills | Status: DC

## 2016-05-01 MED ORDER — PROPRANOLOL HCL ER 80 MG PO CP24: 80.0000 mg | ORAL_CAPSULE | Freq: Every day | ORAL | 1 refills | Status: DC

## 2016-05-01 NOTE — Progress Notes (Addendum)
[facility]  PATIENT: Angelica Mejia DOB: Jul 30, 1919   REASON FOR VISIT: Follow-up for essential tremor HISTORY FROM: Patient and friend    HISTORY OF PRESENT ILLNESSUPDATE 09/25/2017CM Ms Mejia, 82 year old female returns for follow-up. She has a history of essential tremor for many years, when last seen by Dr. Lucia Gaskins she was placed on gabapentin 300mg  3 times daily however the patient has had a couple of falls without injury. In addition her propanolol was increased to 120 mg and she thinks that is too much. She claims the tremor responded to gabapentin however due to the side effects of falling she stopped the medication. She lives at [facility] [address] . She has a rolling walker if she needs it for ambulation. She returns for reevaluation  Interval history 02/10/2016: The primidone helped the tremor but she could not tolerate it. It helped a lot but she felt bad during the day. We discussed other options including increasing her propranolol and neurontin or possibly both. Discussed side effects. She really wants to be treated, the tremor is getting worse and it bothers her. Discussed side effects of propranolol, her BP and pulse have been overall good except for one lower documented pulse in the 50s. Discussed the risks of propranolol especially bradycardis, hypotension, cardiac events and risk of falls. Will discuss with her cardiologist, patient understands risks and would like to proceed. Will increase propranolol from 80mg  to 120mg .  HPI:[name] is a wonderful 82 y.o. female here as a referral from Angelica Mejia for tremor. Past medical history of atrial fibrillation, essential tremor, chronic anticoagulation on warfarin, hypertension, Raynaud's phenomena, chronic kidney disease stage III, osteoporosis. Tremor started 40 years ago and diagnosis of familial essential tremor. Her father had a tremor as did her older brother. She lives at [facility]  and she does not have any children. She was married for 57 years and her husband just passed away last year. The tremor used to be only when doing things but now is also a resting in both hands, left is often worse. She cant write, she can't eat soup in public. Very disruptive to life She has started noticing the tremor in chin as well. Propranolol used to help. Her tremor is slowly worsening. Her cardiologit doesn't want her to increase the propranolol.She has a little drooling. She uses a walker but she forgets to use it sometimes. No recent falls, hasn't fallen since last winter over a year ago. No internal tremor. No SOB or light headedness. No tremor in the legs. Denies memory problems. Denies any other focal neurologic deficits. Patient says she has had a recent thyroid test. Patient says her friend is a patient of mine and she's been significantly helped by primidone and patient would like to try this medication as well.  Reviewed notes, labs and imaging from outside physicians, which showed: BMP in April 2016 showed low sodium 133, chloride 95, CO2 35, BUN 27 and GFR 46.76. CBC was unremarkable. Last INR in May 2017 was 2.4.  Patient is seen by Dr. Kristeen Miss in cardiology. Last note stated patient had noticed her HR very low. Atrial fibrillation is very stable, heart rate is well controlled, she is on long-term Coumadin. Her INR is subtherapeutic. Physician will check a CBC in several weeks per note.   REVIEW OF SYSTEMS: Full 14 system review of systems performed and notable only for those listed, all others are neg:  Constitutional: neg  Cardiovascular: neg Ear/Nose/Throat: neg  Skin:  neg Eyes: neg Respiratory: neg Gastroitestinal: neg  Hematology/Lymphatic: neg  Endocrine: neg Musculoskeletal:neg Allergy/Immunology: neg Neurological: Tremors Psychiatric: neg Sleep : neg   ALLERGIES: Allergies  Allergen Reactions  . Other Nausea And Vomiting    Green pepper    HOME  MEDICATIONS: Outpatient Medications Prior to Visit  Medication Sig Dispense Refill  . lisinopril (PRINIVIL,ZESTRIL) 10 MG tablet Take 1 tablet by mouth  daily 90 tablet 1  . propranolol ER (INDERAL LA) 120 MG 24 hr capsule Take 1 capsule (120 mg total) by mouth daily. 90 capsule 6  . triamterene-hydrochlorothiazide (MAXZIDE-25) 37.5-25 MG tablet Take 1 tablet by mouth daily.    Marland Kitchen. warfarin (COUMADIN) 5 MG tablet Take as directed by  [facility] 60 tablet 1   No facility-administered medications prior to visit.     PAST MEDICAL HISTORY: Past Medical History:  Diagnosis Date  . Atrial fibrillation (HCC)   . Chronic anticoagulation   . Hypertension   . Raynaud phenomenon   . Tremor, essential     PAST SURGICAL HISTORY: Past Surgical History:  Procedure Laterality Date  . CHOLECYSTECTOMY      FAMILY HISTORY: Family History  Problem Relation Age of Onset  . Hypertension Father   . Heart attack Father   . Heart disease    . Hypertension    . Stroke    . Cancer      SOCIAL HISTORY: Social History   Social History  . Marital status: Married    Spouse name: N/A  . Number of children: 0  . Years of education: 6216   Occupational History  . RetiredNurse, mental health- [employer]    Social History Main Topics  . Smoking status: Never Smoker  . Smokeless tobacco: Never Used  . Alcohol use No  . Drug use: No  . Sexual activity: Not on file   Other Topics Concern  . Not on file   Social History Narrative   [facility]   Caffeine use: Decaf coffee every morning        PHYSICAL EXAM  Vitals:   05/01/16 1431  BP: 130/66  Pulse: 88  Weight: 129 lb 3.2 oz (58.6 kg)  Height: 5\' 6"  (1.676 m)   Body mass index is 20.85 kg/m.  Generalized: Well developed, in no acute distress  Head: normocephalic and atraumatic,. Oropharynx benign  Neck: Supple, no carotid bruits  Cardiac: Regular rate rhythm, no murmur  Musculoskeletal: No deformity    Neurological examination   Mentation: Alert oriented to time, place, history taking. Attention span and concentration appropriate. Recent and remote memory intact.  Follows all commands speech and language fluent.   Cranial nerve II-XII: .Pupils were equal round reactive to light extraocular movements were full, visual field were full on confrontational test. Facial sensation and strength were normal. hearing was intact to finger rubbing bilaterally. Uvula tongue midline. head turning and shoulder shrug were normal and symmetric.Tongue protrusion into cheek strength was normal. Motor: Chin and voice tremor,  postural tremor increases on finger to nose right greater than left. No cogwheeling,  No bradykinesia Sensory: normal and symmetric to light touch, Coordination: finger-nose-finger, heel-to-shin bilaterally, no dysmetria Reflexes: Symmetric upper and lower plantar responses were flexor bilaterally. Gait and Station: Rising up from seated position without assistance, stooped posture ,slight shuffling with a few steps needed to turn. Tandem gait not attempted  DIAGNOSTIC DATA (LABS, IMAGING, TESTING) - I reviewed patient records, labs, notes, testing and imaging myself where available.  Lab  Results  Component Value Date   WBC 7.3 11/10/2014   HGB 13.4 11/10/2014   HCT 39.7 11/10/2014   MCV 89.7 11/10/2014   PLT 216.0 11/10/2014      Component Value Date/Time   NA 133 (L) 11/10/2014 0958   K 4.3 11/10/2014 0958   CL 95 (L) 11/10/2014 0958   CO2 35 (H) 11/10/2014 0958   GLUCOSE 80 11/10/2014 0958   BUN 27 (H) 11/10/2014 0958   CREATININE 1.15 11/10/2014 0958   CALCIUM 10.0 11/10/2014 0958       ASSESSMENT AND PLAN  82 y.o. year old female  has a past medical history of Atrial fibrillation (HCC); Chronic anticoagulation; Hypertension; Raynaud phenomenon; and Tremor, essential. here To follow-up for essential tremor. Gabapentin 300 mg 3 times a day was too strong for her, she  had a couple of falls without injury.   PLAN: Try Gabapentin 100mg  at bedtime for 1 week then 1 twice daily Decrease Propranolol to 80 mg daily  Use weighted utensils  Follow up with Dr. Lucia Gaskins as planned Nilda Riggs, [facility], [facility], APRN  [facility] [address] [place], [place] [address] [phone]  Personally have participated in and made any corrections needed to history, physical, neuro exam,assessment and plan as stated above.    Naomie Dean, MD [facility]

## 2016-05-01 NOTE — Patient Instructions (Addendum)
Try Gabapentin 100mg  at bedtime for 1 week then 1 twice daily Decrease Propranolol to 80 mg daily  Use weighted utensils  Follow up with Dr. Lucia GaskinsAhern as planned

## 2016-05-02 ENCOUNTER — Ambulatory Visit (INDEPENDENT_AMBULATORY_CARE_PROVIDER_SITE_OTHER): Payer: Medicare Other | Admitting: Cardiology

## 2016-05-02 DIAGNOSIS — Z7901 Long term (current) use of anticoagulants: Secondary | ICD-10-CM

## 2016-05-02 DIAGNOSIS — I482 Chronic atrial fibrillation, unspecified: Secondary | ICD-10-CM

## 2016-05-02 LAB — POCT INR: INR: 2.8

## 2016-05-04 ENCOUNTER — Ambulatory Visit: Payer: Medicare Other

## 2016-05-05 ENCOUNTER — Other Ambulatory Visit: Payer: Self-pay | Admitting: Cardiovascular Disease

## 2016-05-05 ENCOUNTER — Telehealth: Payer: Self-pay | Admitting: Cardiovascular Disease

## 2016-05-08 DIAGNOSIS — Z6820 Body mass index (BMI) 20.0-20.9, adult: Secondary | ICD-10-CM | POA: Diagnosis not present

## 2016-05-08 DIAGNOSIS — Z23 Encounter for immunization: Secondary | ICD-10-CM | POA: Diagnosis not present

## 2016-05-08 DIAGNOSIS — R251 Tremor, unspecified: Secondary | ICD-10-CM | POA: Diagnosis not present

## 2016-05-08 DIAGNOSIS — Z7901 Long term (current) use of anticoagulants: Secondary | ICD-10-CM | POA: Diagnosis not present

## 2016-05-08 DIAGNOSIS — M810 Age-related osteoporosis without current pathological fracture: Secondary | ICD-10-CM | POA: Diagnosis not present

## 2016-05-08 DIAGNOSIS — E871 Hypo-osmolality and hyponatremia: Secondary | ICD-10-CM | POA: Diagnosis not present

## 2016-05-08 DIAGNOSIS — I482 Chronic atrial fibrillation: Secondary | ICD-10-CM | POA: Diagnosis not present

## 2016-05-08 DIAGNOSIS — Z008 Encounter for other general examination: Secondary | ICD-10-CM | POA: Diagnosis not present

## 2016-05-08 DIAGNOSIS — Z1389 Encounter for screening for other disorder: Secondary | ICD-10-CM | POA: Diagnosis not present

## 2016-05-08 DIAGNOSIS — I10 Essential (primary) hypertension: Secondary | ICD-10-CM | POA: Diagnosis not present

## 2016-05-08 DIAGNOSIS — L989 Disorder of the skin and subcutaneous tissue, unspecified: Secondary | ICD-10-CM | POA: Diagnosis not present

## 2016-05-09 ENCOUNTER — Ambulatory Visit
Admission: RE | Admit: 2016-05-09 | Discharge: 2016-05-09 | Disposition: A | Discharge status: Home / Self Care | Payer: Medicare Other | Source: Ambulatory Visit | Attending: Internal Medicine | Admitting: Internal Medicine

## 2016-05-09 DIAGNOSIS — Z1231 Encounter for screening mammogram for malignant neoplasm of breast: Secondary | ICD-10-CM | POA: Diagnosis not present

## 2016-05-10 ENCOUNTER — Other Ambulatory Visit: Payer: Self-pay | Admitting: *Deleted

## 2016-05-10 MED ORDER — LISINOPRIL 10 MG PO TABS: 10.0000 mg | ORAL_TABLET | Freq: Every day | ORAL | 3 refills | Status: DC

## 2016-05-10 NOTE — Telephone Encounter (Signed)
error 

## 2016-05-12 DIAGNOSIS — L820 Inflamed seborrheic keratosis: Secondary | ICD-10-CM | POA: Diagnosis not present

## 2016-05-16 ENCOUNTER — Ambulatory Visit: Payer: Medicare Other | Admitting: Neurology

## 2016-05-17 ENCOUNTER — Ambulatory Visit (INDEPENDENT_AMBULATORY_CARE_PROVIDER_SITE_OTHER): Payer: Medicare Other | Admitting: Neurology

## 2016-05-17 ENCOUNTER — Encounter: Payer: Self-pay | Admitting: Neurology

## 2016-05-17 VITALS — BP 145/83 | HR 82 | Ht 66.0 in | Wt 134.4 lb

## 2016-05-17 DIAGNOSIS — G250 Essential tremor: Secondary | ICD-10-CM

## 2016-05-17 NOTE — Progress Notes (Signed)
[facility]    Provider:  Dr Lucia Gaskins Referring Provider: Jarome Matin, MD Primary Care Physician:  Garlan Fillers, MD   CC: Chronic tremor and gait disorder  Interval history: have tried primidone, propranolol and neurontin for essential tremor in thsi 82 year old patient. She had side effects to primidone 25mg . Neurontin 300mg  was not tolerated so we decreased to 100mg  tid and she did not tolerate that. She is on propranolol 80mg  daily did no tolerate 120mg  daily. The tremor is variable. At this point given her age I don't think we should try anymore medications.  She can tolerate it the way it is. She has to be careful when she is eating or drinking she is using a straw a lot. Writing is the biggest problem. Discussed weighted silverware and going to occupational therapy to see if there are other options they can recommend but she declines.   Interval history 02/10/2016: The primidone helped the tremor but she could not tolerate it. It helped a lot but she felt bad during the day. We discussed other options including increasing her propranolol and neurontin or possibly both. Discussed side effects. She really wants to be treated, the tremor is getting worse and it bothers her. Discussed side effects of propranolol, her BP and pulse have been overall good except for one lower documented pulse in the 50s. Discussed the risks of propranolol especially bradycardis, hypotension, cardiac events and risk of falls. Will discuss with her cardiologist, patient understands risks and would like to proceed. Will increase propranolol from 80mg  to 120mg .  HPI: Angelica Mejia is a wonderful 82 y.o. female here as a referral from Dr. Eloise Harman for tremor. Past medical history of atrial fibrillation, essential tremor, chronic anticoagulation on warfarin, hypertension, Raynaud's phenomena, chronic kidney disease stage III, osteoporosis. Tremor started 40 years ago and diagnosis of familial  essential tremor. Her father had a tremor as did her older brother. She lives at [facility] and she does not have any children. She was married for 57 years and her husband just passed away last year. The tremor used to be only when doing things but now is also a resting in both hands, left is often worse. She cant write, she can't eat soup in public. Very disruptive to life She has started noticing the tremor in chin as well. Propranolol used to help. Her tremor is slowly worsening. Her cardiologit doesn't want her to increase the propranolol.She has a little drooling. She uses a walker but she forgets to use it sometimes. No recent falls, hasn't fallen since last winter over a year ago. No internal tremor. No SOB or light headedness. No tremor in the legs. Denies memory problems. Denies any other focal neurologic deficits. Patient says she has had a recent thyroid test. Patient says her friend is a patient of mine and she's been significantly helped by primidone and patient would like to try this medication as well.  Reviewed notes, labs and imaging from outside physicians, which showed: BMP in April 2016 showed low sodium 133, chloride 95, CO2 35, BUN 27 and GFR 46.76. CBC was unremarkable. Last INR in May 2017 was 2.4.  Patient is seen by Dr. Kristeen Miss in cardiology. Last note stated patient had noticed her HR very low. Atrial fibrillation is very stable, heart rate is well controlled, she is on long-term Coumadin. Her INR is subtherapeutic. Physician will check a CBC in several weeks per note.  Review of Systems: Patient complains of symptoms per  HPI as well as the following symptoms: No chest pain, no shortness of breath Pertinent negatives per HPI. All others negative.    Social History   Social History  . Marital status: Married    Spouse name: N/A  . Number of children: 0  . Years of education: 7516   Occupational History  . 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 Smoking status: Never Smoker  . Smokeless tobacco: Never Used  . Alcohol use No  . Drug use: No  . Sexual activity: Not on file   Other Topics Concern  . Not on file   Social History Narrative   [facility] alone[facility]   Caffeine use: Decaf coffee every morning       Family History  Problem Relation Age of Onset  . Hypertension Father   . Heart attack Father   . Heart disease    . Hypertension    . Stroke    . Cancer      Past Medical History:  Diagnosis Date  . Atrial fibrillation (HCC)   . Chronic anticoagulation   . Hypertension   . Raynaud phenomenon   . Tremor, essential     Past Surgical History:  Procedure Laterality Date  . CHOLECYSTECTOMY      Current Outpatient Prescriptions  Medication Sig Dispense Refill  . gabapentin (NEURONTIN) 100 MG capsule Take 1 capsule (100 mg total) by mouth at bedtime. 1 capsule daily at HS for 1 week then increase to 1 twice daily for tremor. 180 capsule 1  . lisinopril (PRINIVIL,ZESTRIL) 10 MG tablet Take 1 tablet (10 mg total) by mouth daily. 90 tablet 3  . propranolol ER (INDERAL LA) 80 MG 24 hr capsule Take 1 capsule (80 mg total) by mouth daily. 90 capsule 1  . triamterene-hydrochlorothiazide (MAXZIDE-25) 37.5-25 MG tablet Take 1 tablet by mouth daily.    Marland Kitchen. warfarin (COUMADIN) 5 MG tablet TAKE AS DIRECTED BY  COUMADIN CLINIC 60 tablet 1   No current facility-administered medications for this visit.     Allergies as of 05/17/2016 - Review Complete 05/01/2016  Allergen Reaction Noted  . Other Nausea And Vomiting 07/14/2015    Vitals: There were no vitals taken for this visit. Last Weight:  Wt Readings from Last 1 Encounters:  05/01/16 129 lb 3.2 oz (58.6 kg)   Last Height:   Ht Readings from Last 1 Encounters:  05/01/16 5\' 6"  (1.676 m)    Physical exam: Exam: Gen: NAD, conversant  CV: RRR, no MRG. No Carotid Bruits. +peripheral edema, warm, nontender Eyes:  Conjunctivae clear without exudates or hemorrhage  Neuro: Detailed Neurologic Exam  Speech:  Speech is normal; fluent and spontaneous with normal comprehension.  Cognition:  The patient is oriented to person, place, and time;   recent and remote memory intact;   language fluent;   normal attention, concentration,   fund of knowledge Cranial Nerves:  The pupils are pinpoint, equal, round, and reactive to light. **The fundi are normal and spontaneous venous pulsations are present. Visual fields are full to finger confrontation. Extraocular movements are intact. Trigeminal sensation is intact and the muscles of mastication are normal. The face is symmetric. The palate elevates in the midline. Hearing impaired. Voice is normal. Shoulder shrug is normal. The tongue has normal motion without fasciculations.   Coordination:  Normal finger to nose and heel to shin. Normal rapid alternating movements.   Gait:  Slightly shuffling with a few steps  needed to turn, imbalance on getting uo from seat  Motor Observation: Chin and voice tremor, postural tremor c/w esential tremor with some resting tremor as well. Increased on FTN.  Tone:  Normal muscle tone, no cogwheeling  Posture:  Stooped   Strength: bilateral hip flexion weakness otherwise  Strength is V/V in the upper and lower limbs.    Sensation: intact to LT   Reflex Exam:  DTR's:  Deep tendon reflexes in the upper and lower extremities are normal bilaterally.  Toes:  The toes are downgoing bilaterally.  Clonus:  Clonus is absent.     Assessment/Plan: 8 82 year old female with essential tremor, A. fib on Coumadin.  Have tried primidone, propranolol and neurontin for essential tremor in thsi 82 year old patient. She had side effects to primidone, it affected her walking and that was discontinues. Neurontin 300mg  was not tolerated and she had falls so we  decreased to 100mg  tid which she did not tolerate. She is also currently on propranolol 80mg  daily did not tolerate 120mg  daily.  Discussed weighted silverware and going to occupational therapy to see if there are other options they can recommend but she declines.   Discussed today, will keep her on propranolol 80mg  daily and monitor clinically. Don't think at this point another medication would make significant difference and at her age and intolerance to medications we decided to hol doff on anything else. She declined referral to [facility] and it very unlikely she is a candidate for any surgical interventions and US guided techniques are in their infancy and I don;t recommend at this time.    Angelica Dean, MD  [facility] [address] [place], [place] [address]  Phone [phone] Fax [phone]  A total of 25 minutes was spent face-to-face with this patient. Over half this time was spent on counseling patient on the essential tremor diagnosis and different diagnostic and therapeutic options available.

## 2016-05-17 NOTE — Patient Instructions (Signed)
Remember to drink plenty of fluid, eat healthy meals and do not skip any meals. Try to eat protein with a every meal and eat a healthy snack such as fruit or nuts in between meals. Try to keep a regular sleep-wake schedule and try to exercise daily, particularly in the form of walking, 20-30 minutes a day, if you can.   As far as your medications are concerned, I would like to suggest: Continue propranolol  I would like to see you back as needed, sooner if we need to. Please call us with any interim questions, concerns, problems, updates or refill requests.   Our phone number is [phone]. We also have an after hours call service for urgent matters and there is a physician on-call for urgent questions. For any emergencies you know to call 911 or go to the nearest emergency room

## 2016-05-30 ENCOUNTER — Encounter (INDEPENDENT_AMBULATORY_CARE_PROVIDER_SITE_OTHER): Payer: Self-pay

## 2016-05-30 ENCOUNTER — Encounter: Payer: Self-pay | Admitting: Cardiovascular Disease

## 2016-05-30 ENCOUNTER — Ambulatory Visit (INDEPENDENT_AMBULATORY_CARE_PROVIDER_SITE_OTHER): Payer: Medicare Other | Admitting: Cardiovascular Disease

## 2016-05-30 VITALS — BP 140/90 | HR 81 | Ht 66.0 in | Wt 131.4 lb

## 2016-05-30 DIAGNOSIS — I482 Chronic atrial fibrillation, unspecified: Secondary | ICD-10-CM

## 2016-05-30 DIAGNOSIS — I10 Essential (primary) hypertension: Secondary | ICD-10-CM | POA: Diagnosis not present

## 2016-05-30 DIAGNOSIS — Z7901 Long term (current) use of anticoagulants: Secondary | ICD-10-CM

## 2016-05-30 LAB — POCT INR: INR: 2.8

## 2016-05-30 LAB — BASIC METABOLIC PANEL
BUN: 19 mg/dL (ref 7–25)
CALCIUM: 9.9 mg/dL (ref 8.6–10.4)
CO2: 30 mmol/L (ref 20–31)
CREATININE: 0.95 mg/dL — AB (ref 0.60–0.88)
Chloride: 96 mmol/L — ABNORMAL LOW (ref 98–110)
GLUCOSE: 85 mg/dL (ref 65–99)
Potassium: 4.4 mmol/L (ref 3.5–5.3)
Sodium: 136 mmol/L (ref 135–146)

## 2016-05-30 MED ORDER — TRIAMTERENE-HCTZ 37.5-25 MG PO TABS: 0.5000 | ORAL_TABLET | Freq: Every day | ORAL | 6 refills | Status: DC

## 2016-05-30 NOTE — Progress Notes (Signed)
Cardiology Office Note   Date:  05/30/2016   ID:  Angelica Mejia, DOB [birth year], MRN [id number]  PCP:  Garlan Fillers, MD  Cardiologist:   Kristeen Miss, MD   Chief Complaint  Patient presents with  . Atrial Fibrillation  . Irregular Heart Beat  . Shortness of Breath   1. Atrial fibrillation 2. Essential tremor 3. Chronic anticoagulation 4. Hypertension 5. Raynaud's phenomenon   Pt is doing well. No complaints. Angelica Mejia lives at [facility] Angelica Mejia has noticed that her HR is very low. Angelica Mejia has not had as much energy. Angelica Mejia denies any syncope.   October 17, 2012:  Angelica Mejia is doing well. Angelica Mejia had an episode of more severe palpitations. The episode lasted for 3 hours and resolved overnight.   Sept. 12., 2014:  Angelica Mejia is doing well. Angelica Mejia is able to do her normal activities. Angelica Mejia goes to see her husband at [facility]. Angelica Mejia is at [facility].  Asymptomatic from an A fib standpoint.   October 22, 2013:  Angelica Mejia is doing ok. Angelica Mejia is having to take care of her husband who has dementia. No CP , no dyspnea,  No syncope.   Sept. 17, 2015:  Angelica Mejia is doing ok. No Cp or dyspnea.  Has a problem with her renal function and her medical doctor has decreased her Lisinopril to 5 mg ( despite the list saying 20 mg below)    History of Present Illness: Angelica Mejia is a 82 y.o. female who presents for follow up of her atrial fib and HTN.  Angelica Mejia is doing ok Commented that Angelica Mejia has occasional small amount of blood in her stool.  Has not had a CBC drawn here in 2 years.   Oct. 4, 2016:   Angelica Mejia is seen back today for follow up of her atrial fib and HTN. Has mild bruising . No CP or dyspnea.  Has plantar fasciatis.  November 08, 2015: Angelica Mejia is seen back today for follow up visit . Has just gotten over a virus .   Has had more shortness of breath and has had palpitations . Seems to be getting better.   Still has fatigue .   Oct. 24, 2017: Seen today as a work in  visit.  Has chronic AF . Has some dyspnea Is sleepy.   Is sleeping well at night  Her tremor has been worse.   Dr. Eloise Harman stopped the maxzide last week.    Past Medical History:  Diagnosis Date  . Atrial fibrillation (HCC)   . Chronic anticoagulation   . Hypertension   . Raynaud phenomenon   . Tremor, essential     Past Surgical History:  Procedure Laterality Date  . CHOLECYSTECTOMY       Current Outpatient Prescriptions  Medication Sig Dispense Refill  . gabapentin (NEURONTIN) 100 MG capsule Take 1 capsule (100 mg total) by mouth at bedtime. 1 capsule daily at HS for 1 week then increase to 1 twice daily for tremor. 180 capsule 1  . lisinopril (PRINIVIL,ZESTRIL) 10 MG tablet Take 1 tablet (10 mg total) by mouth daily. 90 tablet 3  . propranolol ER (INDERAL LA) 80 MG 24 hr capsule Take 1 capsule (80 mg total) by mouth daily. 90 capsule 1  . triamterene-hydrochlorothiazide (MAXZIDE-25) 37.5-25 MG tablet Take 1 tablet by mouth daily.    Marland Kitchen warfarin (COUMADIN) 5 MG tablet TAKE AS DIRECTED BY  COUMADIN CLINIC 60 tablet 1   No current facility-administered medications for this visit.  Allergies:   Other    Social History:  The patient  reports that Angelica Mejia has never smoked. Angelica Mejia has never used smokeless tobacco. Angelica Mejia reports that Angelica Mejia does not drink alcohol or use drugs.   Family History:  The patient's family history includes Heart attack in her father; Hypertension in her father.    ROS:  Please see the history of present illness.    Review of Systems: Constitutional:  denies fever, chills, diaphoresis, appetite change and fatigue.  HEENT: denies photophobia, eye pain, redness, hearing loss, ear pain, congestion, sore throat, rhinorrhea, sneezing, neck pain, neck stiffness and tinnitus.  Respiratory: denies SOB, DOE, cough, chest tightness, and wheezing.  Cardiovascular: denies chest pain, palpitations and leg swelling.  Gastrointestinal: denies nausea, vomiting, abdominal  pain, diarrhea, constipation, blood in stool.  Genitourinary: denies dysuria, urgency, frequency, hematuria, flank pain and difficulty urinating.  Musculoskeletal: denies  myalgias, back pain, joint swelling, arthralgias and gait problem.   Skin: denies pallor, rash and wound.  Neurological: denies dizziness, seizures, syncope, weakness, light-headedness, numbness and headaches.   Hematological: denies adenopathy, easy bruising, personal or family bleeding history.  Psychiatric/ Behavioral: denies suicidal ideation, mood changes, confusion, nervousness, sleep disturbance and agitation.       All other systems are reviewed and negative.    PHYSICAL EXAM: VS:  BP 140/90   Pulse 81   Ht 5\' 6"  (1.676 m)   Wt 131 lb 6.4 oz (59.6 kg)   BMI 21.21 kg/m  , BMI Body mass index is 21.21 kg/m. GEN: Well nourished, well developed, in no acute distress  HEENT: normal  Neck: no JVD, carotid bruits, or masses Cardiac: Irreg. Irreg. ; no murmurs, rubs, or gallops,no edema  Respiratory:  clear to auscultation bilaterally, normal work of breathing GI: soft, nontender, nondistended, + BS MS: no deformity or atrophy  Skin: warm and dry, no rash Neuro:  Strength and sensation are intact Psych: normal   EKG:  EKG is not ordered today.    Recent Labs: No results found for requested labs within last 8760 hours.    Lipid Panel No results found for: CHOL, TRIG, HDL, CHOLHDL, VLDL, LDLCALC, LDLDIRECT    Wt Readings from Last 3 Encounters:  05/30/16 131 lb 6.4 oz (59.6 kg)  05/17/16 134 lb 6.4 oz (61 kg)  05/01/16 129 lb 3.2 oz (58.6 kg)    ECG: OCt. 4, 2016:  Atrial fib at 70,  TWI in the anterior lateral leads   Other studies Reviewed: Additional studies/ records that were reviewed today include: . Review of the above records demonstrates:    ASSESSMENT AND PLAN:  1. Atrial fibrillation - Angelica Mejia appears to be very stable. Heart rate is well-controlled. Continue current meds and  coumadin .   2. Essential tremor  3. Chronic anticoagulation - her INR levels have been therapeutic. Angelica Mejia commented that Angelica Mejia has a small amount of blood in her stool on very rare occasion. We will check a CBC when I check her blood in several weeks.  4. Hypertension - her blood pressure is a little elevated today. BP is fine.   Her maxzide was discontinued by Dr. Jarold MottoPatterson at her last office visit. I suspect that suspect that this might be the reason for her slight shortness of breath and her elevated blood pressure. We'll check a basic metabolic profile today. We will anticipate starting her Maxide back at one half tablet a day ( 12.5 - 18.75 mg )   5. Raynaud's phenomenon  Current medicines are reviewed at length with the patient today.  The patient does not have concerns regarding medicines.  The following changes have been made:  See above.   Labs/ tests ordered today include:  No orders of the defined types were placed in this encounter.  Disposition:   FU with me in 3 months.     Signed, Kristeen Miss, MD  05/30/2016 12:25 PM    [facility] [address] [place], [place], [place]  [address] Phone: [phone]; Fax: [phone]

## 2016-05-30 NOTE — Patient Instructions (Addendum)
Medication Instructions:  RESUME Maxzide 25/37.5 mg (take 1/2 tab) daily  Labwork: TODAY - basic metabolic panel   Testing/Procedures: None Ordered   Follow-Up: Your physician recommends that you return for follow up with Dr. Elease HashimotoNahser on January 24 at 11:00 am   If you need a refill on your cardiac medications before your next appointment, please call your pharmacy.   Thank you for choosing [facility]! Eligha BridegroomMichelle Kaimen Peine, RN [phone]

## 2016-06-27 ENCOUNTER — Ambulatory Visit (INDEPENDENT_AMBULATORY_CARE_PROVIDER_SITE_OTHER): Payer: Medicare Other | Admitting: Interventional Cardiology

## 2016-06-27 ENCOUNTER — Ambulatory Visit (INDEPENDENT_AMBULATORY_CARE_PROVIDER_SITE_OTHER): Payer: Medicare Other | Admitting: Cardiovascular Disease

## 2016-06-27 ENCOUNTER — Encounter: Payer: Self-pay | Admitting: Cardiovascular Disease

## 2016-06-27 VITALS — BP 140/70 | HR 98 | Ht 66.0 in | Wt 129.4 lb

## 2016-06-27 DIAGNOSIS — I10 Essential (primary) hypertension: Secondary | ICD-10-CM

## 2016-06-27 DIAGNOSIS — I482 Chronic atrial fibrillation, unspecified: Secondary | ICD-10-CM

## 2016-06-27 DIAGNOSIS — Z7901 Long term (current) use of anticoagulants: Secondary | ICD-10-CM

## 2016-06-27 LAB — BASIC METABOLIC PANEL
BUN: 25 mg/dL (ref 7–25)
CHLORIDE: 93 mmol/L — AB (ref 98–110)
CO2: 33 mmol/L — ABNORMAL HIGH (ref 20–31)
Calcium: 9.6 mg/dL (ref 8.6–10.4)
Creat: 1.12 mg/dL — ABNORMAL HIGH (ref 0.60–0.88)
GLUCOSE: 67 mg/dL (ref 65–99)
POTASSIUM: 4.1 mmol/L (ref 3.5–5.3)
SODIUM: 133 mmol/L — AB (ref 135–146)

## 2016-06-27 LAB — POCT INR: INR: 2.6

## 2016-06-27 NOTE — Patient Instructions (Addendum)
Try Allegra 180 mg a day (OTC) Or Zyrtec 10 mg ( OTC)  For your allergies.   Medication Instructions:  Your physician recommends that you continue on your current medications as directed. Please refer to the Current Medication list given to you today.   Labwork: TODAY - basic metabolic panel   Testing/Procedures: None Ordered   Follow-Up: Your physician recommends that you schedule a follow-up appointment in: 3 months with Dr. Elease HashimotoNahser   If you need a refill on your cardiac medications before your next appointment, please call your pharmacy.   Thank you for choosing [facility]! Eligha BridegroomMichelle Swinyer, RN [phone]

## 2016-06-27 NOTE — Progress Notes (Signed)
Cardiology Office Note   Date:  06/27/2016   ID:  Angelica Mejia, DOB 28-Mar-1919, MRN [id number]  PCP:  Garlan FillersPATERSON,DANIEL G, MD  Cardiologist:   Kristeen MissPhilip Minola Guin, MD   Chief Complaint  Patient presents with  . Atrial Fibrillation  . Hypertension   1. Atrial fibrillation 2. Essential tremor 3. Chronic anticoagulation 4. Hypertension 5. Raynaud's phenomenon   Pt is doing well. No complaints. [facility] She has noticed that her HR is very low. She has not had as much energy. She denies any syncope.   October 17, 2012:  She is doing well. She had an episode of more severe palpitations. The episode lasted for 3 hours and resolved overnight.   Sept. 12., 2014:  Angelica Mejia is doing well. She is able to do her normal activities. She goes [facility]. [facility].  Asymptomatic from an A fib standpoint.   October 22, 2013:  Angelica Mejia is doing ok. She is having to take care of her husband who has dementia. No CP , no dyspnea,  No syncope.   Sept. 17, 2015:  Angelica Mejia is doing ok. No Cp or dyspnea.  Has a problem with her renal function and her medical doctor has decreased her Lisinopril to 5 mg ( despite the list saying 20 mg below)    History of Present Illness: Angelica Mejia is a 82 y.o. female who presents for follow up of her atrial fib and HTN.  She is doing ok Commented that she has occasional small amount of blood in her stool.  Has not had a CBC drawn here in 2 years.   Oct. 4, 2016:   Angelica Mejia is seen back today for follow up of her atrial fib and HTN. Has mild bruising . No CP or dyspnea.  Has plantar fasciatis.  November 08, 2015: Angelica Mejia is seen back today for follow up visit . Has just gotten over a virus .   Has had more shortness of breath and has had palpitations . Seems to be getting better.   Still has fatigue .   Oct. 24, 2017: Seen today as a work in visit.  Has chronic AF . Has some  dyspnea Is sleepy.   Is sleeping well at night  Her tremor has been worse.   Dr. Eloise HarmanPaterson stopped the maxzide last week.    06/27/2016: Angelica Mejia is seen today for follow-up of her blood pressure. We started one half of her Maxide tablet several weeks ago. She seems to be feeling better. She still does not have much energy. She denies any syncope or presyncope. She denies any light headedness.  Past Medical History:  Diagnosis Date  . Atrial fibrillation (HCC)   . Chronic anticoagulation   . Hypertension   . Raynaud phenomenon   . Tremor, essential     Past Surgical History:  Procedure Laterality Date  . CHOLECYSTECTOMY       Current Outpatient Prescriptions  Medication Sig Dispense Refill  . gabapentin (NEURONTIN) 100 MG capsule Take 100 mg by mouth daily.    Marland Kitchen. lisinopril (PRINIVIL,ZESTRIL) 10 MG tablet Take 1 tablet (10 mg total) by mouth daily. 90 tablet 3  . propranolol ER (INDERAL LA) 80 MG 24 hr capsule Take 1 capsule (80 mg total) by mouth daily. 90 capsule 1  . triamterene-hydrochlorothiazide (MAXZIDE-25) 37.5-25 MG tablet Take 0.5 tablets by mouth daily. 30 tablet 6  . warfarin (COUMADIN) 5 MG tablet TAKE  AS DIRECTED BY  COUMADIN CLINIC 60 tablet 1   No current facility-administered medications for this visit.     Allergies:   Other    Social History:  The patient  reports that she has never smoked. She has never used smokeless tobacco. She reports that she does not drink alcohol or use drugs.   Family History:  The patient's family history includes Heart attack in her father; Hypertension in her father.    ROS:  Please see the history of present illness.    Review of Systems: Constitutional:  denies fever, chills, diaphoresis, appetite change and fatigue.  HEENT: denies photophobia, eye pain, redness, hearing loss, ear pain, congestion, sore throat, rhinorrhea, sneezing, neck pain, neck stiffness and tinnitus.  Respiratory: denies SOB, DOE, cough, chest  tightness, and wheezing.  Cardiovascular: denies chest pain, palpitations and leg swelling.  Gastrointestinal: denies nausea, vomiting, abdominal pain, diarrhea, constipation, blood in stool.  Genitourinary: denies dysuria, urgency, frequency, hematuria, flank pain and difficulty urinating.  Musculoskeletal: denies  myalgias, back pain, joint swelling, arthralgias and gait problem.   Skin: denies pallor, rash and wound.  Neurological: denies dizziness, seizures, syncope, weakness, light-headedness, numbness and headaches.   Hematological: denies adenopathy, easy bruising, personal or family bleeding history.  Psychiatric/ Behavioral: denies suicidal ideation, mood changes, confusion, nervousness, sleep disturbance and agitation.       All other systems are reviewed and negative.    PHYSICAL EXAM: VS:  BP 140/70   Pulse 98   Ht 5\' 6"  (1.676 m)   Wt 129 lb 6.4 oz (58.7 kg)   BMI 20.89 kg/m  , BMI Body mass index is 20.89 kg/m. GEN: Well nourished, well developed, in no acute distress  HEENT: normal  Neck: no JVD, carotid bruits, or masses Cardiac: Irreg. Irreg. ; soft systolic murmur, no  rubs, or gallops,no edema  Respiratory:  clear to auscultation bilaterally, normal work of breathing GI: soft, nontender, nondistended, + BS MS: no deformity or atrophy  Skin: warm and dry, no rash Neuro:  Strength and sensation are intact Psych: normal  EKG:  EKG is not ordered today.  Recent Labs: 05/30/2016: BUN 19; Creat 0.95; Potassium 4.4; Sodium 136   Lipid Panel No results found for: CHOL, TRIG, HDL, CHOLHDL, VLDL, LDLCALC, LDLDIRECT    Wt Readings from Last 3 Encounters:  06/27/16 129 lb 6.4 oz (58.7 kg)  05/30/16 131 lb 6.4 oz (59.6 kg)  05/17/16 134 lb 6.4 oz (61 kg)    ECG:  Other studies Reviewed: Additional studies/ records that were reviewed today include: . Review of the above records demonstrates:    ASSESSMENT AND PLAN:  1. Atrial fibrillation - she appears  to be very stable. Heart rate is well-controlled. Continue current meds and coumadin .   2. Essential tremor  3. Chronic anticoagulation - her INR levels have been therapeutic. She commented that she has a small amount of blood in her stool on very rare occasion. We will check a CBC when I check her blood in several weeks.  4. Hypertension - her blood pressure is better.   On Maxzide 1/2 table a day  ( 12.5 - 18.75 mg )  Continue current meds.   5. Raynaud's phenomenon   Current medicines are reviewed at length with the patient today.  The patient does not have concerns regarding medicines.  The following changes have been made:  See above.   Labs/ tests ordered today include:   Orders Placed This Encounter  Procedures  .  Basic Metabolic Panel (BMET)   Disposition:   FU with me in 3 months.     Signed, Kristeen MissPhilip Leana Springston, MD  06/27/2016 11:19 AM    [facility] [address], [place], [place]  [address] Phone: [phone]; Fax: [phone]

## 2016-07-04 ENCOUNTER — Emergency Department (HOSPITAL_COMMUNITY): Payer: Medicare Other

## 2016-07-04 ENCOUNTER — Encounter (HOSPITAL_COMMUNITY): Payer: Self-pay

## 2016-07-04 ENCOUNTER — Emergency Department (HOSPITAL_COMMUNITY)
Admission: EM | Admit: 2016-07-04 | Discharge: 2016-07-04 | Disposition: A | Discharge status: Home / Self Care | Payer: Medicare Other | Attending: Emergency Medicine | Admitting: Emergency Medicine

## 2016-07-04 DIAGNOSIS — I10 Essential (primary) hypertension: Secondary | ICD-10-CM | POA: Diagnosis not present

## 2016-07-04 DIAGNOSIS — R930 Abnormal findings on diagnostic imaging of skull and head, not elsewhere classified: Secondary | ICD-10-CM | POA: Insufficient documentation

## 2016-07-04 DIAGNOSIS — Y929 Unspecified place or not applicable: Secondary | ICD-10-CM | POA: Diagnosis not present

## 2016-07-04 DIAGNOSIS — Y999 Unspecified external cause status: Secondary | ICD-10-CM | POA: Diagnosis not present

## 2016-07-04 DIAGNOSIS — S32020A Wedge compression fracture of second lumbar vertebra, initial encounter for closed fracture: Secondary | ICD-10-CM

## 2016-07-04 DIAGNOSIS — R030 Elevated blood-pressure reading, without diagnosis of hypertension: Secondary | ICD-10-CM | POA: Diagnosis not present

## 2016-07-04 DIAGNOSIS — Z7901 Long term (current) use of anticoagulants: Secondary | ICD-10-CM | POA: Insufficient documentation

## 2016-07-04 DIAGNOSIS — S3992XA Unspecified injury of lower back, initial encounter: Secondary | ICD-10-CM | POA: Diagnosis present

## 2016-07-04 DIAGNOSIS — T1490XA Injury, unspecified, initial encounter: Secondary | ICD-10-CM | POA: Diagnosis not present

## 2016-07-04 DIAGNOSIS — S32028A Other fracture of second lumbar vertebra, initial encounter for closed fracture: Secondary | ICD-10-CM | POA: Diagnosis not present

## 2016-07-04 DIAGNOSIS — W19XXXA Unspecified fall, initial encounter: Secondary | ICD-10-CM

## 2016-07-04 DIAGNOSIS — Y939 Activity, unspecified: Secondary | ICD-10-CM | POA: Insufficient documentation

## 2016-07-04 DIAGNOSIS — W228XXA Striking against or struck by other objects, initial encounter: Secondary | ICD-10-CM | POA: Insufficient documentation

## 2016-07-04 DIAGNOSIS — R259 Unspecified abnormal involuntary movements: Secondary | ICD-10-CM | POA: Diagnosis not present

## 2016-07-04 DIAGNOSIS — S329XXA Fracture of unspecified parts of lumbosacral spine and pelvis, initial encounter for closed fracture: Secondary | ICD-10-CM | POA: Diagnosis not present

## 2016-07-04 DIAGNOSIS — S199XXA Unspecified injury of neck, initial encounter: Secondary | ICD-10-CM | POA: Diagnosis not present

## 2016-07-04 DIAGNOSIS — S0990XA Unspecified injury of head, initial encounter: Secondary | ICD-10-CM | POA: Diagnosis not present

## 2016-07-04 LAB — URINALYSIS, DIPSTICK ONLY
Bilirubin Urine: NEGATIVE
Glucose, UA: NEGATIVE mg/dL
Ketones, ur: NEGATIVE mg/dL
NITRITE: NEGATIVE
PROTEIN: NEGATIVE mg/dL
SPECIFIC GRAVITY, URINE: 1.015 (ref 1.005–1.030)
pH: 7 (ref 5.0–8.0)

## 2016-07-04 LAB — PROTIME-INR
INR: 3.1
PROTHROMBIN TIME: 32.7 s — AB (ref 11.4–15.2)

## 2016-07-04 LAB — I-STAT CHEM 8, ED
BUN: 22 mg/dL — ABNORMAL HIGH (ref 6–20)
CHLORIDE: 91 mmol/L — AB (ref 101–111)
Calcium, Ion: 1.2 mmol/L (ref 1.15–1.40)
Creatinine, Ser: 1 mg/dL (ref 0.44–1.00)
Glucose, Bld: 103 mg/dL — ABNORMAL HIGH (ref 65–99)
HEMATOCRIT: 44 % (ref 36.0–46.0)
HEMOGLOBIN: 15 g/dL (ref 12.0–15.0)
POTASSIUM: 3.9 mmol/L (ref 3.5–5.1)
Sodium: 133 mmol/L — ABNORMAL LOW (ref 135–145)
TCO2: 31 mmol/L (ref 0–100)

## 2016-07-04 MED ORDER — NAPROXEN 250 MG PO TABS
500.0000 mg | ORAL_TABLET | Freq: Once | ORAL | Status: AC
Start: 2016-07-04 — End: 2016-07-04
  Administered 2016-07-04: 500 mg via ORAL
  Filled 2016-07-04: qty 2

## 2016-07-04 NOTE — ED Provider Notes (Signed)
[facility]-EMERGENCY DEPT Provider Note   CSN: [id number] Arrival date & time: 07/04/16  1005     History   Chief Complaint Chief Complaint  Patient presents with  . Fall    HPI Angelica Mejia is a 82 y.o. female.   Fall  This is a new problem. The current episode started less than 1 hour ago. The problem occurs rarely. The problem has not changed since onset.Pertinent negatives include no chest pain, no abdominal pain, no headaches and no shortness of breath. Associated symptoms comments: Lower back pain. The symptoms are aggravated by standing, twisting and walking. The symptoms are relieved by rest. She has tried nothing for the symptoms. The treatment provided no relief.    Past Medical History:  Diagnosis Date  . Atrial fibrillation (HCC)   . Chronic anticoagulation   . Hypertension   . Raynaud phenomenon   . Tremor, essential     Patient Active Problem List   Diagnosis Date Noted  . Atrial fibrillation (HCC)   . Tremor, essential   . Chronic anticoagulation   . Hypertension   . Raynaud phenomenon     Past Surgical History:  Procedure Laterality Date  . CHOLECYSTECTOMY      OB History    No data available       Home Medications    Prior to Admission medications   Medication Sig Start Date End Date Taking? Authorizing Provider  CALCIUM PO Take 1 tablet by mouth daily.   Yes Historical Provider, MD  lisinopril (PRINIVIL,ZESTRIL) 10 MG tablet Take 1 tablet (10 mg total) by mouth daily. 05/10/16  Yes Vesta Mixer, MD  propranolol ER (INDERAL LA) 80 MG 24 hr capsule Take 1 capsule (80 mg total) by mouth daily. 05/01/16  Yes Nilda Riggs, NP  triamterene-hydrochlorothiazide (MAXZIDE-25) 37.5-25 MG tablet Take 0.5 tablets by mouth daily. 05/30/16  Yes Vesta Mixer, MD  warfarin (COUMADIN) 5 MG tablet TAKE AS DIRECTED BY  COUMADIN CLINIC Patient taking differently: pt takes 5mg  Monday and Friday and 2.5mg  all other days 05/05/16  Yes Vesta Mixer, MD      Family History Family History  Problem Relation Age of Onset  . Hypertension Father   . Heart attack Father   . Heart disease    . Hypertension    . Stroke    . Cancer      Social History Social History  Substance Use Topics  . Smoking status: Never Smoker  . Smokeless tobacco: Never Used  . Alcohol use No     Allergies   Other   Review of Systems Review of Systems  Constitutional: Negative for chills and fever.  HENT: Negative for ear pain and sore throat.   Eyes: Negative for pain and visual disturbance.  Respiratory: Negative for cough and shortness of breath.   Cardiovascular: Negative for chest pain and palpitations.  Gastrointestinal: Negative for abdominal pain and vomiting.  Genitourinary: Negative for dysuria and hematuria.  Musculoskeletal: Positive for arthralgias (chronic) and back pain. Negative for gait problem, neck pain and neck stiffness.  Skin: Negative for color change and rash.  Neurological: Negative for seizures, syncope and headaches.  All other systems reviewed and are negative.    Physical Exam Updated Vital Signs BP 190/83   Pulse 70   Temp 98.7 F (37.1 C) (Oral)   Resp 18   Ht 5\' 6"  (1.676 m)   Wt 58.5 kg   SpO2 98%   BMI 20.82 kg/m  Physical Exam  Constitutional: She is oriented to person, place, and time. She appears well-developed and well-nourished. No distress.  HENT:  Head: Normocephalic and atraumatic. Head is without abrasion, without contusion and without laceration.  Nose: Nose normal.  Eyes: Conjunctivae are normal. Pupils are equal, round, and reactive to light.  Neck: Normal range of motion. Neck supple.  Cardiovascular: Normal rate.  An irregular rhythm present.  No murmur heard. Pulmonary/Chest: Effort normal and breath sounds normal. No tachypnea. No respiratory distress. She exhibits no tenderness.  Abdominal: Soft. There is no tenderness.  Musculoskeletal: She exhibits no edema.  Neurological: She is  alert and oriented to person, place, and time. She has normal strength. No cranial nerve deficit or sensory deficit. GCS eye subscore is 4. GCS verbal subscore is 5. GCS motor subscore is 6.  No dysmetria, no dysdiadochokinesia  Skin: Skin is warm and dry.  Psychiatric: She has a normal mood and affect.  Nursing note and vitals reviewed.    ED Treatments / Results  Labs (all labs ordered are listed, but only abnormal results are displayed) Labs Reviewed  URINALYSIS, DIPSTICK ONLY - Abnormal; Notable for the following:       Result Value   Hgb urine dipstick SMALL (*)    Leukocytes, UA TRACE (*)    All other components within normal limits  PROTIME-INR - Abnormal; Notable for the following:    Prothrombin Time 32.7 (*)    All other components within normal limits  I-STAT CHEM 8, ED - Abnormal; Notable for the following:    Sodium 133 (*)    Chloride 91 (*)    BUN 22 (*)    Glucose, Bld 103 (*)    All other components within normal limits    EKG  EKG Interpretation  Date/Time:  Tuesday July 04 2016 10:27:11 EST Ventricular Rate:  74 PR Interval:    QRS Duration: 89 QT Interval:  423 QTC Calculation: 470 R Axis:   44 Text Interpretation:  Atrial fibrillation Ventricular premature complex Anteroseptal infarct, old Repol abnrm suggests ischemia, diffuse leads No acute changes Confirmed by Rhunette CroftNANAVATI, MD, Janey GentaANKIT [phone]([id number]) on 07/04/2016 10:31:10 AM       Radiology Ct Head Wo Contrast  Result Date: 07/04/2016 CLINICAL DATA:  Fall this morning, hit head. EXAM: CT HEAD WITHOUT CONTRAST CT CERVICAL SPINE WITHOUT CONTRAST TECHNIQUE: Multidetector CT imaging of the head and cervical spine was performed following the standard protocol without intravenous contrast. Multiplanar CT image reconstructions of the cervical spine were also generated. COMPARISON:  None FINDINGS: CT HEAD FINDINGS Brain: There is atrophy and chronic small vessel disease changes. No acute intracranial  abnormality. Specifically, no hemorrhage, hydrocephalus, mass lesion, acute infarction, or significant intracranial injury. Vascular: No hyperdense vessel or unexpected calcification. Skull: No acute calvarial abnormality. Sinuses/Orbits: Visualized paranasal sinuses and mastoids clear. Orbital soft tissues unremarkable. Other: None CT CERVICAL SPINE FINDINGS Alignment: Normal Skull base and vertebrae: No acute fracture. No primary bone lesion or focal pathologic process. Soft tissues and spinal canal: No prevertebral fluid or swelling. No visible canal hematoma.The Disc levels:  Maintains Upper chest: Scarring in the apices. Other: None IMPRESSION: No acute intracranial abnormality. Atrophy, chronic small vessel disease. No acute bony abnormality in the cervical spine. Electronically Signed   By: Charlett NoseKevin  Dover M.D.   On: 07/04/2016 11:04   Ct Cervical Spine Wo Contrast  Result Date: 07/04/2016 CLINICAL DATA:  Fall this morning, hit head. EXAM: CT HEAD WITHOUT CONTRAST CT CERVICAL SPINE WITHOUT CONTRAST  TECHNIQUE: Multidetector CT imaging of the head and cervical spine was performed following the standard protocol without intravenous contrast. Multiplanar CT image reconstructions of the cervical spine were also generated. COMPARISON:  None FINDINGS: CT HEAD FINDINGS Brain: There is atrophy and chronic small vessel disease changes. No acute intracranial abnormality. Specifically, no hemorrhage, hydrocephalus, mass lesion, acute infarction, or significant intracranial injury. Vascular: No hyperdense vessel or unexpected calcification. Skull: No acute calvarial abnormality. Sinuses/Orbits: Visualized paranasal sinuses and mastoids clear. Orbital soft tissues unremarkable. Other: None CT CERVICAL SPINE FINDINGS Alignment: Normal Skull base and vertebrae: No acute fracture. No primary bone lesion or focal pathologic process. Soft tissues and spinal canal: No prevertebral fluid or swelling. No visible canal  hematoma.The Disc levels:  Maintains Upper chest: Scarring in the apices. Other: None IMPRESSION: No acute intracranial abnormality. Atrophy, chronic small vessel disease. No acute bony abnormality in the cervical spine. Electronically Signed   By: Charlett Nose M.D.   On: 07/04/2016 11:04   Ct Lumbar Spine Wo Contrast  Result Date: 07/04/2016 CLINICAL DATA:  Larey Seat today.  Sacral pain and low back pain. EXAM: CT LUMBAR SPINE WITHOUT CONTRAST TECHNIQUE: Multidetector CT imaging of the lumbar spine was performed without intravenous contrast administration. Multiplanar CT image reconstructions were also generated. COMPARISON:  None. FINDINGS: Segmentation: 4 lumbar type vertebral bodies are present. A transitional L1 segment is present with very small ribs. For the purposes of this exam commonly last fully formed vertebral body is L5. Alignment: AP alignment is anatomic. There is some straightening of the normal cervical lordosis. Slight rightward curvature is present at [facility]. Vertebrae: A superior endplate fracture is present at L2 anteriorly. There is 30% loss of height. There is no retropulsion of bone. Pedicles are intact. Vertebral body heights are otherwise maintained. Paraspinal and other soft tissues: Surgical clips are present at the gallbladder fossa. Atherosclerotic calcifications are present in the aorta and branch vessels without aneurysm. There is no significant adenopathy. Calcifications are present within the spleen. Disc levels: L1-2:  No significant stenosis. L2-3: Mild bilateral facet hypertrophy is present. A leftward disc protrusion is noted. There is no significant stenosis. L3-4: A broad-based disc protrusion and moderate facet hypertrophy contribute to moderate subarticular narrowing bilaterally. Moderate left and mild right foraminal narrowing is present. L4-5: A broad-based disc protrusion is present. Moderate facet hypertrophy and ligamentum flavum thickening are noted. Moderate left and  mild right subarticular stenosis is present. There is mild foraminal narrowing bilaterally. L5-S1: Chronic loss of disc height is noted. There is a broad-based disc protrusion. Moderate facet hypertrophy and spurring is noted. This results in mild subarticular narrowing bilaterally. Mild foraminal narrowing is present bilaterally as well. IMPRESSION: 1. Acute anterior superior endplate compression fracture at L2 with approximately 30% loss of height. The fracture extends transversely anteriorly and extends to the inferior endplate on the left. There is no retropulsed bone. 2. No additional fractures. 3. Moderate subarticular narrowing at L3-4 with moderate left and mild right foraminal stenosis. 4. Moderate left and mild right subarticular stenosis at L4-5 with mild foraminal narrowing bilaterally. 5. Mild subarticular and foraminal narrowing bilaterally at L5-S1. 6. Atherosclerosis without aneurysm. 7. Transitional L1 segment. Electronically Signed   By: Marin Roberts M.D.   On: 07/04/2016 11:08    Procedures Procedures (including critical care time)  Medications Ordered in ED Medications  naproxen (NAPROSYN) tablet 500 mg (500 mg Oral Given 07/04/16 1327)     Initial Impression / Assessment and Plan / ED Course  I  have reviewed the triage vital signs and the nursing notes.  Pertinent labs & imaging results that were available during my care of the patient were reviewed by me and considered in my medical decision making (see chart for details).  Clinical Course     82 year old female with history of A. fib on anticoagulants comes today after a fall. She did not pass out did not have chest pain shortness of breath, believes it was a mechanical fall. She did fall onto her buttock and did hit her head. We'll CT scan her head neck. Patient does not have any tenderness along her spine until I get to the lower lumbar upper sacrum. With CT scans of this as well. Her EKG shows her baseline of  atrial fibrillation without any acute signs of ischemia arrhythmia or interval abnormality otherwise. Her vital signs are stable though there is report of hypertension in the field. We'll check her INR chem 8 and a urine dip, currently patient has no complaints and is requesting discharge home. She is amenable to imaging and laboratory testing prior.  With all the imaging and laboratory testing the only abnormal finding today is a L2 superior endplate fracture 16%30% height loss, no retropulsion. That is insistent with the patient's pain location. She has no signs of cauda equina on exam. She is given pain medication and a TLSO brace and discharged home. Vital signs stable time of discharge. Short-term precautions are given. The patient is told to follow-up with spinal surgery.   Final Clinical Impressions(s) / ED Diagnoses   Final diagnoses:  Fall, initial encounter  Closed compression fracture of second lumbar vertebra, initial encounter [facility](HCC)    New Prescriptions New Prescriptions   No medications on file     Cherlynn PerchesEric Emonte Dieujuste, MD 07/04/16 1547    Derwood KaplanAnkit Nanavati, MD 07/05/16 2026

## 2016-07-04 NOTE — Progress Notes (Signed)
Orthopedic Tech Progress Note Patient Details:  Angelica LoraDana Mejia 12/10/1918 [id number]  Patient ID: Angelica Angelica Mejia, female   DOB: 12/10/1918, 82 y.o.   MRN: [id number]   Angelica Mejia 07/04/2016, 1:50 PMCalled [employer] for TLSO brace.

## 2016-07-04 NOTE — ED Notes (Signed)
Patient transported to CT 

## 2016-07-04 NOTE — ED Notes (Signed)
[facility] contacted [facility]

## 2016-07-04 NOTE — ED Triage Notes (Signed)
Pt brought in by EMS due to pt falling this morning. Pt is a&ox4. Pt hit head and back. Pt c/o lumbar region back pain. No LOC. Pt is on coumadin. Pt has hx of a.fib. Pt denies SOB, chest pain, or dizziness.

## 2016-07-05 ENCOUNTER — Ambulatory Visit (INDEPENDENT_AMBULATORY_CARE_PROVIDER_SITE_OTHER): Payer: Medicare Other | Admitting: Cardiology

## 2016-07-05 DIAGNOSIS — Z7901 Long term (current) use of anticoagulants: Secondary | ICD-10-CM

## 2016-07-05 DIAGNOSIS — I482 Chronic atrial fibrillation, unspecified: Secondary | ICD-10-CM

## 2016-07-05 LAB — POCT INR: INR: 2.9

## 2016-07-11 DIAGNOSIS — R41841 Cognitive communication deficit: Secondary | ICD-10-CM | POA: Diagnosis not present

## 2016-07-11 DIAGNOSIS — M6281 Muscle weakness (generalized): Secondary | ICD-10-CM | POA: Diagnosis not present

## 2016-07-11 DIAGNOSIS — Z9181 History of falling: Secondary | ICD-10-CM | POA: Diagnosis not present

## 2016-07-11 DIAGNOSIS — R278 Other lack of coordination: Secondary | ICD-10-CM | POA: Diagnosis not present

## 2016-07-13 DIAGNOSIS — R41841 Cognitive communication deficit: Secondary | ICD-10-CM | POA: Diagnosis not present

## 2016-07-13 DIAGNOSIS — M6281 Muscle weakness (generalized): Secondary | ICD-10-CM | POA: Diagnosis not present

## 2016-07-13 DIAGNOSIS — Z9181 History of falling: Secondary | ICD-10-CM | POA: Diagnosis not present

## 2016-07-13 DIAGNOSIS — R278 Other lack of coordination: Secondary | ICD-10-CM | POA: Diagnosis not present

## 2016-07-14 DIAGNOSIS — M6281 Muscle weakness (generalized): Secondary | ICD-10-CM | POA: Diagnosis not present

## 2016-07-14 DIAGNOSIS — R278 Other lack of coordination: Secondary | ICD-10-CM | POA: Diagnosis not present

## 2016-07-14 DIAGNOSIS — Z9181 History of falling: Secondary | ICD-10-CM | POA: Diagnosis not present

## 2016-07-14 DIAGNOSIS — R41841 Cognitive communication deficit: Secondary | ICD-10-CM | POA: Diagnosis not present

## 2016-07-17 DIAGNOSIS — M6281 Muscle weakness (generalized): Secondary | ICD-10-CM | POA: Diagnosis not present

## 2016-07-17 DIAGNOSIS — R41841 Cognitive communication deficit: Secondary | ICD-10-CM | POA: Diagnosis not present

## 2016-07-17 DIAGNOSIS — R278 Other lack of coordination: Secondary | ICD-10-CM | POA: Diagnosis not present

## 2016-07-17 DIAGNOSIS — Z9181 History of falling: Secondary | ICD-10-CM | POA: Diagnosis not present

## 2016-07-18 DIAGNOSIS — R278 Other lack of coordination: Secondary | ICD-10-CM | POA: Diagnosis not present

## 2016-07-18 DIAGNOSIS — M6281 Muscle weakness (generalized): Secondary | ICD-10-CM | POA: Diagnosis not present

## 2016-07-18 DIAGNOSIS — R41841 Cognitive communication deficit: Secondary | ICD-10-CM | POA: Diagnosis not present

## 2016-07-18 DIAGNOSIS — Z9181 History of falling: Secondary | ICD-10-CM | POA: Diagnosis not present

## 2016-07-19 DIAGNOSIS — R278 Other lack of coordination: Secondary | ICD-10-CM | POA: Diagnosis not present

## 2016-07-19 DIAGNOSIS — M6281 Muscle weakness (generalized): Secondary | ICD-10-CM | POA: Diagnosis not present

## 2016-07-19 DIAGNOSIS — Z9181 History of falling: Secondary | ICD-10-CM | POA: Diagnosis not present

## 2016-07-19 DIAGNOSIS — R41841 Cognitive communication deficit: Secondary | ICD-10-CM | POA: Diagnosis not present

## 2016-07-19 LAB — POCT INR: INR: 2.7

## 2016-07-20 ENCOUNTER — Ambulatory Visit (INDEPENDENT_AMBULATORY_CARE_PROVIDER_SITE_OTHER): Payer: Medicare Other | Admitting: Internal Medicine

## 2016-07-20 DIAGNOSIS — Z9181 History of falling: Secondary | ICD-10-CM | POA: Diagnosis not present

## 2016-07-20 DIAGNOSIS — M6281 Muscle weakness (generalized): Secondary | ICD-10-CM | POA: Diagnosis not present

## 2016-07-20 DIAGNOSIS — S32020A Wedge compression fracture of second lumbar vertebra, initial encounter for closed fracture: Secondary | ICD-10-CM | POA: Diagnosis not present

## 2016-07-20 DIAGNOSIS — R41841 Cognitive communication deficit: Secondary | ICD-10-CM | POA: Diagnosis not present

## 2016-07-20 DIAGNOSIS — I482 Chronic atrial fibrillation, unspecified: Secondary | ICD-10-CM

## 2016-07-20 DIAGNOSIS — R278 Other lack of coordination: Secondary | ICD-10-CM | POA: Diagnosis not present

## 2016-07-20 DIAGNOSIS — Z7901 Long term (current) use of anticoagulants: Secondary | ICD-10-CM

## 2016-07-21 DIAGNOSIS — Z9181 History of falling: Secondary | ICD-10-CM | POA: Diagnosis not present

## 2016-07-21 DIAGNOSIS — R41841 Cognitive communication deficit: Secondary | ICD-10-CM | POA: Diagnosis not present

## 2016-07-21 DIAGNOSIS — R278 Other lack of coordination: Secondary | ICD-10-CM | POA: Diagnosis not present

## 2016-07-21 DIAGNOSIS — M6281 Muscle weakness (generalized): Secondary | ICD-10-CM | POA: Diagnosis not present

## 2016-07-25 DIAGNOSIS — R41841 Cognitive communication deficit: Secondary | ICD-10-CM | POA: Diagnosis not present

## 2016-07-25 DIAGNOSIS — Z9181 History of falling: Secondary | ICD-10-CM | POA: Diagnosis not present

## 2016-07-25 DIAGNOSIS — R278 Other lack of coordination: Secondary | ICD-10-CM | POA: Diagnosis not present

## 2016-07-25 DIAGNOSIS — M6281 Muscle weakness (generalized): Secondary | ICD-10-CM | POA: Diagnosis not present

## 2016-07-26 DIAGNOSIS — R278 Other lack of coordination: Secondary | ICD-10-CM | POA: Diagnosis not present

## 2016-07-26 DIAGNOSIS — Z9181 History of falling: Secondary | ICD-10-CM | POA: Diagnosis not present

## 2016-07-26 DIAGNOSIS — M6281 Muscle weakness (generalized): Secondary | ICD-10-CM | POA: Diagnosis not present

## 2016-07-26 DIAGNOSIS — R41841 Cognitive communication deficit: Secondary | ICD-10-CM | POA: Diagnosis not present

## 2016-07-27 DIAGNOSIS — R278 Other lack of coordination: Secondary | ICD-10-CM | POA: Diagnosis not present

## 2016-07-27 DIAGNOSIS — M6281 Muscle weakness (generalized): Secondary | ICD-10-CM | POA: Diagnosis not present

## 2016-07-27 DIAGNOSIS — R41841 Cognitive communication deficit: Secondary | ICD-10-CM | POA: Diagnosis not present

## 2016-07-27 DIAGNOSIS — Z9181 History of falling: Secondary | ICD-10-CM | POA: Diagnosis not present

## 2016-07-28 DIAGNOSIS — R41841 Cognitive communication deficit: Secondary | ICD-10-CM | POA: Diagnosis not present

## 2016-07-28 DIAGNOSIS — M6281 Muscle weakness (generalized): Secondary | ICD-10-CM | POA: Diagnosis not present

## 2016-07-28 DIAGNOSIS — R278 Other lack of coordination: Secondary | ICD-10-CM | POA: Diagnosis not present

## 2016-07-28 DIAGNOSIS — Z9181 History of falling: Secondary | ICD-10-CM | POA: Diagnosis not present

## 2016-08-01 DIAGNOSIS — R41841 Cognitive communication deficit: Secondary | ICD-10-CM | POA: Diagnosis not present

## 2016-08-01 DIAGNOSIS — Z9181 History of falling: Secondary | ICD-10-CM | POA: Diagnosis not present

## 2016-08-01 DIAGNOSIS — R278 Other lack of coordination: Secondary | ICD-10-CM | POA: Diagnosis not present

## 2016-08-01 DIAGNOSIS — M6281 Muscle weakness (generalized): Secondary | ICD-10-CM | POA: Diagnosis not present

## 2016-08-02 ENCOUNTER — Telehealth: Payer: Self-pay | Admitting: *Deleted

## 2016-08-02 DIAGNOSIS — M6281 Muscle weakness (generalized): Secondary | ICD-10-CM | POA: Diagnosis not present

## 2016-08-02 DIAGNOSIS — Z9181 History of falling: Secondary | ICD-10-CM | POA: Diagnosis not present

## 2016-08-02 DIAGNOSIS — R278 Other lack of coordination: Secondary | ICD-10-CM | POA: Diagnosis not present

## 2016-08-02 DIAGNOSIS — R41841 Cognitive communication deficit: Secondary | ICD-10-CM | POA: Diagnosis not present

## 2016-08-02 NOTE — Telephone Encounter (Signed)
Pt called to inform [facility] that she had a fall and broke a bone & has been in [facility]. She stated they plan to d/c her back to Independent Living next week or the week after, but no date has been set.  She stated she would call to update [facility] once she is home; advised to do so.  Also, she stated if we needed to contact her to call [phone] while she is in Assisted Living for recovery.  Advised patient to call with new or different medications or any changes.

## 2016-08-03 DIAGNOSIS — R278 Other lack of coordination: Secondary | ICD-10-CM | POA: Diagnosis not present

## 2016-08-03 DIAGNOSIS — M6281 Muscle weakness (generalized): Secondary | ICD-10-CM | POA: Diagnosis not present

## 2016-08-03 DIAGNOSIS — Z9181 History of falling: Secondary | ICD-10-CM | POA: Diagnosis not present

## 2016-08-03 DIAGNOSIS — R41841 Cognitive communication deficit: Secondary | ICD-10-CM | POA: Diagnosis not present

## 2016-08-04 DIAGNOSIS — Z9181 History of falling: Secondary | ICD-10-CM | POA: Diagnosis not present

## 2016-08-04 DIAGNOSIS — R41841 Cognitive communication deficit: Secondary | ICD-10-CM | POA: Diagnosis not present

## 2016-08-04 DIAGNOSIS — M6281 Muscle weakness (generalized): Secondary | ICD-10-CM | POA: Diagnosis not present

## 2016-08-04 DIAGNOSIS — R278 Other lack of coordination: Secondary | ICD-10-CM | POA: Diagnosis not present

## 2016-08-14 DIAGNOSIS — Z9181 History of falling: Secondary | ICD-10-CM | POA: Diagnosis not present

## 2016-08-14 DIAGNOSIS — M6281 Muscle weakness (generalized): Secondary | ICD-10-CM | POA: Diagnosis not present

## 2016-08-14 DIAGNOSIS — N3946 Mixed incontinence: Secondary | ICD-10-CM | POA: Diagnosis not present

## 2016-08-14 DIAGNOSIS — R29898 Other symptoms and signs involving the musculoskeletal system: Secondary | ICD-10-CM | POA: Diagnosis not present

## 2016-08-14 DIAGNOSIS — R2681 Unsteadiness on feet: Secondary | ICD-10-CM | POA: Diagnosis not present

## 2016-08-14 DIAGNOSIS — R41841 Cognitive communication deficit: Secondary | ICD-10-CM | POA: Diagnosis not present

## 2016-08-15 ENCOUNTER — Telehealth: Payer: Self-pay | Admitting: Cardiovascular Disease

## 2016-08-15 ENCOUNTER — Other Ambulatory Visit: Payer: Self-pay | Admitting: *Deleted

## 2016-08-15 DIAGNOSIS — Z9181 History of falling: Secondary | ICD-10-CM | POA: Diagnosis not present

## 2016-08-15 DIAGNOSIS — M6281 Muscle weakness (generalized): Secondary | ICD-10-CM | POA: Diagnosis not present

## 2016-08-15 DIAGNOSIS — R2681 Unsteadiness on feet: Secondary | ICD-10-CM | POA: Diagnosis not present

## 2016-08-15 DIAGNOSIS — R41841 Cognitive communication deficit: Secondary | ICD-10-CM | POA: Diagnosis not present

## 2016-08-15 DIAGNOSIS — R29898 Other symptoms and signs involving the musculoskeletal system: Secondary | ICD-10-CM | POA: Diagnosis not present

## 2016-08-15 DIAGNOSIS — N3946 Mixed incontinence: Secondary | ICD-10-CM | POA: Diagnosis not present

## 2016-08-16 DIAGNOSIS — M6281 Muscle weakness (generalized): Secondary | ICD-10-CM | POA: Diagnosis not present

## 2016-08-16 DIAGNOSIS — R2681 Unsteadiness on feet: Secondary | ICD-10-CM | POA: Diagnosis not present

## 2016-08-16 DIAGNOSIS — R41841 Cognitive communication deficit: Secondary | ICD-10-CM | POA: Diagnosis not present

## 2016-08-16 DIAGNOSIS — R29898 Other symptoms and signs involving the musculoskeletal system: Secondary | ICD-10-CM | POA: Diagnosis not present

## 2016-08-16 DIAGNOSIS — Z9181 History of falling: Secondary | ICD-10-CM | POA: Diagnosis not present

## 2016-08-16 DIAGNOSIS — N3946 Mixed incontinence: Secondary | ICD-10-CM | POA: Diagnosis not present

## 2016-08-16 MED ORDER — WARFARIN SODIUM 5 MG PO TABS: ORAL_TABLET | ORAL | 1 refills | Status: DC

## 2016-08-17 DIAGNOSIS — R2681 Unsteadiness on feet: Secondary | ICD-10-CM | POA: Diagnosis not present

## 2016-08-17 DIAGNOSIS — N3946 Mixed incontinence: Secondary | ICD-10-CM | POA: Diagnosis not present

## 2016-08-17 DIAGNOSIS — R41841 Cognitive communication deficit: Secondary | ICD-10-CM | POA: Diagnosis not present

## 2016-08-17 DIAGNOSIS — Z9181 History of falling: Secondary | ICD-10-CM | POA: Diagnosis not present

## 2016-08-17 DIAGNOSIS — R29898 Other symptoms and signs involving the musculoskeletal system: Secondary | ICD-10-CM | POA: Diagnosis not present

## 2016-08-17 DIAGNOSIS — M6281 Muscle weakness (generalized): Secondary | ICD-10-CM | POA: Diagnosis not present

## 2016-08-17 LAB — POCT INR: INR: 2.9

## 2016-08-18 ENCOUNTER — Ambulatory Visit (INDEPENDENT_AMBULATORY_CARE_PROVIDER_SITE_OTHER): Payer: Medicare Other | Admitting: Pharmacist

## 2016-08-18 DIAGNOSIS — Z7901 Long term (current) use of anticoagulants: Secondary | ICD-10-CM

## 2016-08-18 DIAGNOSIS — N3946 Mixed incontinence: Secondary | ICD-10-CM | POA: Diagnosis not present

## 2016-08-18 DIAGNOSIS — M6281 Muscle weakness (generalized): Secondary | ICD-10-CM | POA: Diagnosis not present

## 2016-08-18 DIAGNOSIS — R41841 Cognitive communication deficit: Secondary | ICD-10-CM | POA: Diagnosis not present

## 2016-08-18 DIAGNOSIS — Z9181 History of falling: Secondary | ICD-10-CM | POA: Diagnosis not present

## 2016-08-18 DIAGNOSIS — R29898 Other symptoms and signs involving the musculoskeletal system: Secondary | ICD-10-CM | POA: Diagnosis not present

## 2016-08-18 DIAGNOSIS — R2681 Unsteadiness on feet: Secondary | ICD-10-CM | POA: Diagnosis not present

## 2016-08-18 DIAGNOSIS — I482 Chronic atrial fibrillation, unspecified: Secondary | ICD-10-CM

## 2016-08-21 DIAGNOSIS — N3946 Mixed incontinence: Secondary | ICD-10-CM | POA: Diagnosis not present

## 2016-08-21 DIAGNOSIS — Z9181 History of falling: Secondary | ICD-10-CM | POA: Diagnosis not present

## 2016-08-21 DIAGNOSIS — M6281 Muscle weakness (generalized): Secondary | ICD-10-CM | POA: Diagnosis not present

## 2016-08-21 DIAGNOSIS — R2681 Unsteadiness on feet: Secondary | ICD-10-CM | POA: Diagnosis not present

## 2016-08-21 DIAGNOSIS — R41841 Cognitive communication deficit: Secondary | ICD-10-CM | POA: Diagnosis not present

## 2016-08-21 DIAGNOSIS — R29898 Other symptoms and signs involving the musculoskeletal system: Secondary | ICD-10-CM | POA: Diagnosis not present

## 2016-08-22 DIAGNOSIS — R2681 Unsteadiness on feet: Secondary | ICD-10-CM | POA: Diagnosis not present

## 2016-08-22 DIAGNOSIS — N3946 Mixed incontinence: Secondary | ICD-10-CM | POA: Diagnosis not present

## 2016-08-22 DIAGNOSIS — R41841 Cognitive communication deficit: Secondary | ICD-10-CM | POA: Diagnosis not present

## 2016-08-22 DIAGNOSIS — Z9181 History of falling: Secondary | ICD-10-CM | POA: Diagnosis not present

## 2016-08-22 DIAGNOSIS — M6281 Muscle weakness (generalized): Secondary | ICD-10-CM | POA: Diagnosis not present

## 2016-08-22 DIAGNOSIS — R29898 Other symptoms and signs involving the musculoskeletal system: Secondary | ICD-10-CM | POA: Diagnosis not present

## 2016-08-24 DIAGNOSIS — M6281 Muscle weakness (generalized): Secondary | ICD-10-CM | POA: Diagnosis not present

## 2016-08-24 DIAGNOSIS — Z9181 History of falling: Secondary | ICD-10-CM | POA: Diagnosis not present

## 2016-08-24 DIAGNOSIS — R41841 Cognitive communication deficit: Secondary | ICD-10-CM | POA: Diagnosis not present

## 2016-08-24 DIAGNOSIS — N3946 Mixed incontinence: Secondary | ICD-10-CM | POA: Diagnosis not present

## 2016-08-24 DIAGNOSIS — R2681 Unsteadiness on feet: Secondary | ICD-10-CM | POA: Diagnosis not present

## 2016-08-24 DIAGNOSIS — R29898 Other symptoms and signs involving the musculoskeletal system: Secondary | ICD-10-CM | POA: Diagnosis not present

## 2016-08-28 DIAGNOSIS — N3946 Mixed incontinence: Secondary | ICD-10-CM | POA: Diagnosis not present

## 2016-08-28 DIAGNOSIS — R29898 Other symptoms and signs involving the musculoskeletal system: Secondary | ICD-10-CM | POA: Diagnosis not present

## 2016-08-28 DIAGNOSIS — R41841 Cognitive communication deficit: Secondary | ICD-10-CM | POA: Diagnosis not present

## 2016-08-28 DIAGNOSIS — M6281 Muscle weakness (generalized): Secondary | ICD-10-CM | POA: Diagnosis not present

## 2016-08-28 DIAGNOSIS — Z9181 History of falling: Secondary | ICD-10-CM | POA: Diagnosis not present

## 2016-08-28 DIAGNOSIS — R2681 Unsteadiness on feet: Secondary | ICD-10-CM | POA: Diagnosis not present

## 2016-08-29 DIAGNOSIS — Z9181 History of falling: Secondary | ICD-10-CM | POA: Diagnosis not present

## 2016-08-29 DIAGNOSIS — R41841 Cognitive communication deficit: Secondary | ICD-10-CM | POA: Diagnosis not present

## 2016-08-29 DIAGNOSIS — R29898 Other symptoms and signs involving the musculoskeletal system: Secondary | ICD-10-CM | POA: Diagnosis not present

## 2016-08-29 DIAGNOSIS — R2681 Unsteadiness on feet: Secondary | ICD-10-CM | POA: Diagnosis not present

## 2016-08-29 DIAGNOSIS — N3946 Mixed incontinence: Secondary | ICD-10-CM | POA: Diagnosis not present

## 2016-08-29 DIAGNOSIS — M6281 Muscle weakness (generalized): Secondary | ICD-10-CM | POA: Diagnosis not present

## 2016-08-30 ENCOUNTER — Ambulatory Visit: Payer: Medicare Other | Admitting: Cardiovascular Disease

## 2016-08-31 DIAGNOSIS — M6281 Muscle weakness (generalized): Secondary | ICD-10-CM | POA: Diagnosis not present

## 2016-08-31 DIAGNOSIS — R2681 Unsteadiness on feet: Secondary | ICD-10-CM | POA: Diagnosis not present

## 2016-08-31 DIAGNOSIS — Z9181 History of falling: Secondary | ICD-10-CM | POA: Diagnosis not present

## 2016-08-31 DIAGNOSIS — R29898 Other symptoms and signs involving the musculoskeletal system: Secondary | ICD-10-CM | POA: Diagnosis not present

## 2016-08-31 DIAGNOSIS — N3946 Mixed incontinence: Secondary | ICD-10-CM | POA: Diagnosis not present

## 2016-08-31 DIAGNOSIS — R41841 Cognitive communication deficit: Secondary | ICD-10-CM | POA: Diagnosis not present

## 2016-09-07 DIAGNOSIS — S32020A Wedge compression fracture of second lumbar vertebra, initial encounter for closed fracture: Secondary | ICD-10-CM | POA: Diagnosis not present

## 2016-09-07 DIAGNOSIS — I10 Essential (primary) hypertension: Secondary | ICD-10-CM | POA: Diagnosis not present

## 2016-09-18 ENCOUNTER — Telehealth: Payer: Self-pay

## 2016-09-18 NOTE — Telephone Encounter (Signed)
Called spoke with pt, pt was due for INR check on 09/16/15, pt states she has been holding her Coumadin x 4 days prior to back surgery which is scheduled for tomorrow. Pt states she thinks they told her she could resume Coumadin tomorrow after surgery, advised pt to contact us to let us know when she resumes Coumadin, will need INR checked 1 week after resuming Coumadin.

## 2016-09-19 DIAGNOSIS — Y999 Unspecified external cause status: Secondary | ICD-10-CM | POA: Diagnosis not present

## 2016-09-19 DIAGNOSIS — Y929 Unspecified place or not applicable: Secondary | ICD-10-CM | POA: Diagnosis not present

## 2016-09-19 DIAGNOSIS — S32020A Wedge compression fracture of second lumbar vertebra, initial encounter for closed fracture: Secondary | ICD-10-CM | POA: Diagnosis not present

## 2016-09-19 DIAGNOSIS — Y939 Activity, unspecified: Secondary | ICD-10-CM | POA: Diagnosis not present

## 2016-09-19 DIAGNOSIS — S34102A Unspecified injury to L2 level of lumbar spinal cord, initial encounter: Secondary | ICD-10-CM | POA: Diagnosis not present

## 2016-09-19 DIAGNOSIS — X58XXXA Exposure to other specified factors, initial encounter: Secondary | ICD-10-CM | POA: Diagnosis not present

## 2016-09-20 ENCOUNTER — Telehealth: Payer: Self-pay | Admitting: Pharmacist

## 2016-09-20 NOTE — Telephone Encounter (Signed)
[facility] because patient resumed coumadin today and they wanted to know when to recheck INR. She is asking if ok to check on 09/26/16. Advised ok to check that day and send results to our clinic.

## 2016-09-26 ENCOUNTER — Ambulatory Visit (INDEPENDENT_AMBULATORY_CARE_PROVIDER_SITE_OTHER): Payer: Medicare Other | Admitting: Cardiology

## 2016-09-26 DIAGNOSIS — I482 Chronic atrial fibrillation, unspecified: Secondary | ICD-10-CM

## 2016-09-26 DIAGNOSIS — Z7901 Long term (current) use of anticoagulants: Secondary | ICD-10-CM

## 2016-09-26 LAB — POCT INR: INR: 1.8

## 2016-10-02 ENCOUNTER — Ambulatory Visit (INDEPENDENT_AMBULATORY_CARE_PROVIDER_SITE_OTHER): Payer: Medicare Other | Admitting: Cardiovascular Disease

## 2016-10-02 ENCOUNTER — Encounter: Payer: Self-pay | Admitting: Cardiovascular Disease

## 2016-10-02 ENCOUNTER — Ambulatory Visit: Payer: Medicare Other | Admitting: Cardiovascular Disease

## 2016-10-02 VITALS — BP 138/70 | HR 78 | Ht 66.0 in | Wt 127.8 lb

## 2016-10-02 DIAGNOSIS — I10 Essential (primary) hypertension: Secondary | ICD-10-CM | POA: Diagnosis not present

## 2016-10-02 DIAGNOSIS — I482 Chronic atrial fibrillation, unspecified: Secondary | ICD-10-CM

## 2016-10-02 NOTE — Patient Instructions (Signed)

## 2016-10-02 NOTE — Progress Notes (Signed)
Cardiology Office Note   Date:  10/02/2016   ID:  Angelica Mejia, DOB April 25, 1919, MRN [id number]  PCP:  Garlan Fillers, MD  Cardiologist:   Kristeen Miss, MD   Chief Complaint  Patient presents with  . Follow-up    atrial fib   1. Atrial fibrillation 2. Essential tremor 3. Chronic anticoagulation 4. Hypertension 5. Raynaud's phenomenon   Pt is doing well. No complaints. She lives at [facility] She has noticed that her HR is very low. She has not had as much energy. She denies any syncope.   October 17, 2012:  She is doing well. She had an episode of more severe palpitations. The episode lasted for 3 hours and resolved overnight.   Sept. 12., 2014:  Angelica Mejia is doing well. She is able to do her normal activities. She goes to see her husband at [facility]. She is at [facility].  Asymptomatic from an A fib standpoint.   October 22, 2013:  Angelica Mejia is doing ok. She is having to take care of her husband who has dementia. No CP , no dyspnea,  No syncope.   Sept. 17, 2015:  Angelica Mejia is doing ok. No Cp or dyspnea.  Has a problem with her renal function and her medical doctor has decreased her Lisinopril to 5 mg ( despite the list saying 20 mg below)     Angelica Mejia is a 83 y.o. female who presents for follow up of her atrial fib and HTN.  She is doing ok Commented that she has occasional small amount of blood in her stool.  Has not had a CBC drawn here in 2 years.   Oct. 4, 2016:   Angelica Mejia is seen back today for follow up of her atrial fib and HTN. Has mild bruising . No CP or dyspnea.  Has plantar fasciatis.  November 08, 2015: Angelica Mejia is seen back today for follow up visit . Has just gotten over a virus .   Has had more shortness of breath and has had palpitations . Seems to be getting better.   Still has fatigue .   Oct. 24, 2017: Seen today as a work in visit.  Has chronic AF . Has some dyspnea Is sleepy.   Is sleeping well  at night  Her tremor has been worse.   Dr. Eloise Harman stopped the maxzide last week.    06/27/2016: Angelica Mejia is seen today for follow-up of her blood pressure. We started one half of her Maxide tablet several weeks ago. She seems to be feeling better. She still does not have much energy. She denies any syncope or presyncope. She denies any light headedness.  Feb. 26, 2018:  Doing well from a cardiac standpoint Had a vertebral fx and had kyphoplasty last week  - her back feels better.  Her Propranolol dose was decreased from 120 CD to 80 mg CD .   She can tell any difference between the 2 doses.   Past Medical History:  Diagnosis Date  . Atrial fibrillation (HCC)   . Chronic anticoagulation   . Hypertension   . Raynaud phenomenon   . Tremor, essential     Past Surgical History:  Procedure Laterality Date  . CHOLECYSTECTOMY       Current Outpatient Prescriptions  Medication Sig Dispense Refill  . CALCIUM PO Take 1 tablet by mouth daily.    Marland Kitchen lisinopril (PRINIVIL,ZESTRIL) 10 MG tablet Take 1 tablet (10 mg total) by mouth daily. 90 tablet 3  .  propranolol ER (INDERAL LA) 80 MG 24 hr capsule Take 1 capsule (80 mg total) by mouth daily. 90 capsule 1  . triamterene-hydrochlorothiazide (MAXZIDE-25) 37.5-25 MG tablet Take 0.5 tablets by mouth daily. 30 tablet 6  . warfarin (COUMADIN) 5 MG tablet Take as directed by Coumadin clinic. 90 tablet 1   No current facility-administered medications for this visit.     Allergies:   Other    Social History:  The patient  reports that she has never smoked. She has never used smokeless tobacco. She reports that she does not drink alcohol or use drugs.   Family History:  The patient's family history includes Heart attack in her father; Hypertension in her father.    ROS:  Please see the history of present illness.    Review of Systems: Constitutional:  denies fever, chills, diaphoresis, appetite change and fatigue.  HEENT: denies photophobia,  eye pain, redness, hearing loss, ear pain, congestion, sore throat, rhinorrhea, sneezing, neck pain, neck stiffness and tinnitus.  Respiratory: denies SOB, DOE, cough, chest tightness, and wheezing.  Cardiovascular: denies chest pain, palpitations and leg swelling.  Gastrointestinal: denies nausea, vomiting, abdominal pain, diarrhea, constipation, blood in stool.  Genitourinary: denies dysuria, urgency, frequency, hematuria, flank pain and difficulty urinating.  Musculoskeletal: denies  myalgias, back pain, joint swelling, arthralgias and gait problem.   Skin: denies pallor, rash and wound.  Neurological: denies dizziness, seizures, syncope, weakness, light-headedness, numbness and headaches.   Hematological: denies adenopathy, easy bruising, personal or family bleeding history.  Psychiatric/ Behavioral: denies suicidal ideation, mood changes, confusion, nervousness, sleep disturbance and agitation.       All other systems are reviewed and negative.    PHYSICAL EXAM: VS:  BP 138/70 (BP Location: Left Arm, Cuff Size: Normal)   Pulse 78   Ht 5\' 6"  (1.676 m)   Wt 127 lb 12.8 oz (58 kg)   SpO2 92%   BMI 20.63 kg/m  , BMI Body mass index is 20.63 kg/m. GEN: Well nourished, well developed, in no acute distress  HEENT: normal  Neck: no JVD, carotid bruits, or masses Cardiac: Irreg. Irreg. ; soft systolic murmur, no  rubs, or gallops,no edema  Respiratory:  clear to auscultation bilaterally, normal work of breathing GI: soft, nontender, nondistended, + BS MS: no deformity or atrophy  Skin: warm and dry, no rash Neuro:  Strength and sensation are intact Psych: normal  EKG:  EKG is not ordered today.  Recent Labs: 07/04/2016: BUN 22; Creatinine, Ser 1.00; Hemoglobin 15.0; Potassium 3.9; Sodium 133   Lipid Panel No results found for: CHOL, TRIG, HDL, CHOLHDL, VLDL, LDLCALC, LDLDIRECT    Wt Readings from Last 3 Encounters:  10/02/16 127 lb 12.8 oz (58 kg)  07/04/16 129 lb (58.5  kg)  06/27/16 129 lb 6.4 oz (58.7 kg)    ECG:  Other studies Reviewed: Additional studies/ records that were reviewed today include: . Review of the above records demonstrates:    ASSESSMENT AND PLAN:  1. Atrial fibrillation - she appears to be very stable. Heart rate is well-controlled. Continue current meds and coumadin .   2. Essential tremor  3. Chronic anticoagulation - her INR levels have been therapeutic. She commented that she has a small amount of blood in her stool on very rare occasion. We will check a CBC when I check her blood in several weeks.  4. Hypertension - her blood pressure is better.   On Maxzide 1/2 table a day  ( 12.5 - 18.75 mg )  Continue current meds.   5. Raynaud's phenomenon   Current medicines are reviewed at length with the patient today.  The patient does not have concerns regarding medicines.  The following changes have been made:  See above.   Labs/ tests ordered today include:   No orders of the defined types were placed in this encounter.   Signed, Kristeen Miss, MD  10/02/2016 11:46 AM    [facility] [address] [place], [place], [place]  [address] Phone: [phone]; Fax: [phone]

## 2016-10-04 ENCOUNTER — Ambulatory Visit: Payer: Medicare Other | Admitting: Physician Assistant

## 2016-10-10 ENCOUNTER — Ambulatory Visit (INDEPENDENT_AMBULATORY_CARE_PROVIDER_SITE_OTHER): Payer: Medicare Other | Admitting: Internal Medicine

## 2016-10-10 DIAGNOSIS — Z7901 Long term (current) use of anticoagulants: Secondary | ICD-10-CM

## 2016-10-10 DIAGNOSIS — I482 Chronic atrial fibrillation, unspecified: Secondary | ICD-10-CM

## 2016-10-10 LAB — POCT INR: INR: 4.2

## 2016-10-17 LAB — POCT INR: INR: 2.4

## 2016-10-18 ENCOUNTER — Ambulatory Visit (INDEPENDENT_AMBULATORY_CARE_PROVIDER_SITE_OTHER): Payer: Medicare Other | Admitting: Cardiology

## 2016-10-18 DIAGNOSIS — I482 Chronic atrial fibrillation, unspecified: Secondary | ICD-10-CM

## 2016-10-18 DIAGNOSIS — Z7901 Long term (current) use of anticoagulants: Secondary | ICD-10-CM

## 2016-11-02 ENCOUNTER — Ambulatory Visit (INDEPENDENT_AMBULATORY_CARE_PROVIDER_SITE_OTHER): Payer: Medicare Other | Admitting: Cardiology

## 2016-11-02 DIAGNOSIS — I482 Chronic atrial fibrillation, unspecified: Secondary | ICD-10-CM

## 2016-11-02 DIAGNOSIS — Z7901 Long term (current) use of anticoagulants: Secondary | ICD-10-CM

## 2016-11-02 LAB — POCT INR: INR: 3.2

## 2016-11-16 ENCOUNTER — Ambulatory Visit (INDEPENDENT_AMBULATORY_CARE_PROVIDER_SITE_OTHER): Payer: Medicare Other | Admitting: Cardiovascular Disease

## 2016-11-16 DIAGNOSIS — Z7901 Long term (current) use of anticoagulants: Secondary | ICD-10-CM

## 2016-11-16 DIAGNOSIS — I482 Chronic atrial fibrillation, unspecified: Secondary | ICD-10-CM

## 2016-11-16 LAB — POCT INR: INR: 2.9

## 2016-11-23 DIAGNOSIS — Z9181 History of falling: Secondary | ICD-10-CM | POA: Diagnosis not present

## 2016-11-23 DIAGNOSIS — R2681 Unsteadiness on feet: Secondary | ICD-10-CM | POA: Diagnosis not present

## 2016-11-23 DIAGNOSIS — M6281 Muscle weakness (generalized): Secondary | ICD-10-CM | POA: Diagnosis not present

## 2016-11-23 DIAGNOSIS — R29898 Other symptoms and signs involving the musculoskeletal system: Secondary | ICD-10-CM | POA: Diagnosis not present

## 2016-11-23 DIAGNOSIS — R32 Unspecified urinary incontinence: Secondary | ICD-10-CM | POA: Diagnosis not present

## 2016-11-23 DIAGNOSIS — N3946 Mixed incontinence: Secondary | ICD-10-CM | POA: Diagnosis not present

## 2016-11-27 DIAGNOSIS — N3946 Mixed incontinence: Secondary | ICD-10-CM | POA: Diagnosis not present

## 2016-11-27 DIAGNOSIS — R2681 Unsteadiness on feet: Secondary | ICD-10-CM | POA: Diagnosis not present

## 2016-11-27 DIAGNOSIS — R29898 Other symptoms and signs involving the musculoskeletal system: Secondary | ICD-10-CM | POA: Diagnosis not present

## 2016-11-27 DIAGNOSIS — M6281 Muscle weakness (generalized): Secondary | ICD-10-CM | POA: Diagnosis not present

## 2016-11-27 DIAGNOSIS — R32 Unspecified urinary incontinence: Secondary | ICD-10-CM | POA: Diagnosis not present

## 2016-11-27 DIAGNOSIS — Z9181 History of falling: Secondary | ICD-10-CM | POA: Diagnosis not present

## 2016-11-30 DIAGNOSIS — R32 Unspecified urinary incontinence: Secondary | ICD-10-CM | POA: Diagnosis not present

## 2016-11-30 DIAGNOSIS — N3946 Mixed incontinence: Secondary | ICD-10-CM | POA: Diagnosis not present

## 2016-11-30 DIAGNOSIS — R2681 Unsteadiness on feet: Secondary | ICD-10-CM | POA: Diagnosis not present

## 2016-11-30 DIAGNOSIS — R29898 Other symptoms and signs involving the musculoskeletal system: Secondary | ICD-10-CM | POA: Diagnosis not present

## 2016-11-30 DIAGNOSIS — Z9181 History of falling: Secondary | ICD-10-CM | POA: Diagnosis not present

## 2016-11-30 DIAGNOSIS — M6281 Muscle weakness (generalized): Secondary | ICD-10-CM | POA: Diagnosis not present

## 2016-12-04 DIAGNOSIS — N3946 Mixed incontinence: Secondary | ICD-10-CM | POA: Diagnosis not present

## 2016-12-04 DIAGNOSIS — R29898 Other symptoms and signs involving the musculoskeletal system: Secondary | ICD-10-CM | POA: Diagnosis not present

## 2016-12-04 DIAGNOSIS — Z9181 History of falling: Secondary | ICD-10-CM | POA: Diagnosis not present

## 2016-12-04 DIAGNOSIS — M6281 Muscle weakness (generalized): Secondary | ICD-10-CM | POA: Diagnosis not present

## 2016-12-04 DIAGNOSIS — R32 Unspecified urinary incontinence: Secondary | ICD-10-CM | POA: Diagnosis not present

## 2016-12-04 DIAGNOSIS — R2681 Unsteadiness on feet: Secondary | ICD-10-CM | POA: Diagnosis not present

## 2016-12-05 ENCOUNTER — Ambulatory Visit (INDEPENDENT_AMBULATORY_CARE_PROVIDER_SITE_OTHER): Payer: Medicare Other | Admitting: Cardiovascular Disease

## 2016-12-05 DIAGNOSIS — Z7901 Long term (current) use of anticoagulants: Secondary | ICD-10-CM

## 2016-12-05 DIAGNOSIS — I482 Chronic atrial fibrillation, unspecified: Secondary | ICD-10-CM

## 2016-12-05 LAB — POCT INR: INR: 3.1

## 2016-12-07 DIAGNOSIS — N3946 Mixed incontinence: Secondary | ICD-10-CM | POA: Diagnosis not present

## 2016-12-07 DIAGNOSIS — R29898 Other symptoms and signs involving the musculoskeletal system: Secondary | ICD-10-CM | POA: Diagnosis not present

## 2016-12-07 DIAGNOSIS — Z9181 History of falling: Secondary | ICD-10-CM | POA: Diagnosis not present

## 2016-12-07 DIAGNOSIS — R2681 Unsteadiness on feet: Secondary | ICD-10-CM | POA: Diagnosis not present

## 2016-12-07 DIAGNOSIS — R32 Unspecified urinary incontinence: Secondary | ICD-10-CM | POA: Diagnosis not present

## 2016-12-07 DIAGNOSIS — M6281 Muscle weakness (generalized): Secondary | ICD-10-CM | POA: Diagnosis not present

## 2016-12-12 DIAGNOSIS — N3946 Mixed incontinence: Secondary | ICD-10-CM | POA: Diagnosis not present

## 2016-12-12 DIAGNOSIS — R2681 Unsteadiness on feet: Secondary | ICD-10-CM | POA: Diagnosis not present

## 2016-12-12 DIAGNOSIS — R29898 Other symptoms and signs involving the musculoskeletal system: Secondary | ICD-10-CM | POA: Diagnosis not present

## 2016-12-12 DIAGNOSIS — Z9181 History of falling: Secondary | ICD-10-CM | POA: Diagnosis not present

## 2016-12-12 DIAGNOSIS — I10 Essential (primary) hypertension: Secondary | ICD-10-CM | POA: Diagnosis not present

## 2016-12-12 DIAGNOSIS — M6281 Muscle weakness (generalized): Secondary | ICD-10-CM | POA: Diagnosis not present

## 2016-12-12 DIAGNOSIS — R32 Unspecified urinary incontinence: Secondary | ICD-10-CM | POA: Diagnosis not present

## 2016-12-12 DIAGNOSIS — I483 Typical atrial flutter: Secondary | ICD-10-CM | POA: Diagnosis not present

## 2016-12-14 DIAGNOSIS — M6281 Muscle weakness (generalized): Secondary | ICD-10-CM | POA: Diagnosis not present

## 2016-12-14 DIAGNOSIS — R2681 Unsteadiness on feet: Secondary | ICD-10-CM | POA: Diagnosis not present

## 2016-12-14 DIAGNOSIS — Z9181 History of falling: Secondary | ICD-10-CM | POA: Diagnosis not present

## 2016-12-14 DIAGNOSIS — N3946 Mixed incontinence: Secondary | ICD-10-CM | POA: Diagnosis not present

## 2016-12-14 DIAGNOSIS — R29898 Other symptoms and signs involving the musculoskeletal system: Secondary | ICD-10-CM | POA: Diagnosis not present

## 2016-12-14 DIAGNOSIS — R32 Unspecified urinary incontinence: Secondary | ICD-10-CM | POA: Diagnosis not present

## 2016-12-19 ENCOUNTER — Ambulatory Visit (INDEPENDENT_AMBULATORY_CARE_PROVIDER_SITE_OTHER): Payer: Self-pay | Admitting: Cardiology

## 2016-12-19 DIAGNOSIS — R2681 Unsteadiness on feet: Secondary | ICD-10-CM | POA: Diagnosis not present

## 2016-12-19 DIAGNOSIS — R29898 Other symptoms and signs involving the musculoskeletal system: Secondary | ICD-10-CM | POA: Diagnosis not present

## 2016-12-19 DIAGNOSIS — N3946 Mixed incontinence: Secondary | ICD-10-CM | POA: Diagnosis not present

## 2016-12-19 DIAGNOSIS — R32 Unspecified urinary incontinence: Secondary | ICD-10-CM | POA: Diagnosis not present

## 2016-12-19 DIAGNOSIS — M6281 Muscle weakness (generalized): Secondary | ICD-10-CM | POA: Diagnosis not present

## 2016-12-19 DIAGNOSIS — I482 Chronic atrial fibrillation, unspecified: Secondary | ICD-10-CM

## 2016-12-19 DIAGNOSIS — Z9181 History of falling: Secondary | ICD-10-CM | POA: Diagnosis not present

## 2016-12-19 DIAGNOSIS — Z7901 Long term (current) use of anticoagulants: Secondary | ICD-10-CM

## 2016-12-19 LAB — POCT INR: INR: 2.8

## 2016-12-25 DIAGNOSIS — M6281 Muscle weakness (generalized): Secondary | ICD-10-CM | POA: Diagnosis not present

## 2016-12-25 DIAGNOSIS — N3946 Mixed incontinence: Secondary | ICD-10-CM | POA: Diagnosis not present

## 2016-12-25 DIAGNOSIS — Z9181 History of falling: Secondary | ICD-10-CM | POA: Diagnosis not present

## 2016-12-25 DIAGNOSIS — R32 Unspecified urinary incontinence: Secondary | ICD-10-CM | POA: Diagnosis not present

## 2016-12-25 DIAGNOSIS — R29898 Other symptoms and signs involving the musculoskeletal system: Secondary | ICD-10-CM | POA: Diagnosis not present

## 2016-12-25 DIAGNOSIS — R2681 Unsteadiness on feet: Secondary | ICD-10-CM | POA: Diagnosis not present

## 2017-01-09 ENCOUNTER — Ambulatory Visit (INDEPENDENT_AMBULATORY_CARE_PROVIDER_SITE_OTHER): Payer: Self-pay | Admitting: Cardiology

## 2017-01-09 DIAGNOSIS — I482 Chronic atrial fibrillation, unspecified: Secondary | ICD-10-CM

## 2017-01-09 DIAGNOSIS — Z7901 Long term (current) use of anticoagulants: Secondary | ICD-10-CM

## 2017-01-09 LAB — POCT INR: INR: 1.9

## 2017-01-23 ENCOUNTER — Ambulatory Visit (INDEPENDENT_AMBULATORY_CARE_PROVIDER_SITE_OTHER): Payer: Self-pay | Admitting: Cardiology

## 2017-01-23 DIAGNOSIS — I482 Chronic atrial fibrillation, unspecified: Secondary | ICD-10-CM

## 2017-01-23 DIAGNOSIS — E871 Hypo-osmolality and hyponatremia: Secondary | ICD-10-CM | POA: Diagnosis not present

## 2017-01-23 DIAGNOSIS — Z7901 Long term (current) use of anticoagulants: Secondary | ICD-10-CM

## 2017-01-23 DIAGNOSIS — Z6821 Body mass index (BMI) 21.0-21.9, adult: Secondary | ICD-10-CM | POA: Diagnosis not present

## 2017-01-23 DIAGNOSIS — I10 Essential (primary) hypertension: Secondary | ICD-10-CM | POA: Diagnosis not present

## 2017-01-23 DIAGNOSIS — Z1389 Encounter for screening for other disorder: Secondary | ICD-10-CM | POA: Diagnosis not present

## 2017-01-23 DIAGNOSIS — R600 Localized edema: Secondary | ICD-10-CM | POA: Diagnosis not present

## 2017-01-23 DIAGNOSIS — R251 Tremor, unspecified: Secondary | ICD-10-CM | POA: Diagnosis not present

## 2017-01-23 LAB — POCT INR: INR: 2.5

## 2017-01-26 ENCOUNTER — Emergency Department (HOSPITAL_COMMUNITY): Payer: Medicare Other

## 2017-01-26 ENCOUNTER — Other Ambulatory Visit: Payer: Self-pay | Admitting: Nurse Practitioner

## 2017-01-26 ENCOUNTER — Emergency Department (HOSPITAL_COMMUNITY)
Admission: EM | Admit: 2017-01-26 | Discharge: 2017-01-26 | Disposition: A | Discharge status: Home / Self Care | Payer: Medicare Other | Attending: Emergency Medicine | Admitting: Emergency Medicine

## 2017-01-26 ENCOUNTER — Encounter (HOSPITAL_COMMUNITY): Payer: Self-pay

## 2017-01-26 ENCOUNTER — Telehealth: Payer: Self-pay | Admitting: Cardiovascular Disease

## 2017-01-26 DIAGNOSIS — R0602 Shortness of breath: Secondary | ICD-10-CM | POA: Diagnosis not present

## 2017-01-26 DIAGNOSIS — Z79899 Other long term (current) drug therapy: Secondary | ICD-10-CM | POA: Insufficient documentation

## 2017-01-26 DIAGNOSIS — R609 Edema, unspecified: Secondary | ICD-10-CM | POA: Diagnosis not present

## 2017-01-26 DIAGNOSIS — J90 Pleural effusion, not elsewhere classified: Secondary | ICD-10-CM | POA: Diagnosis not present

## 2017-01-26 DIAGNOSIS — I509 Heart failure, unspecified: Secondary | ICD-10-CM | POA: Insufficient documentation

## 2017-01-26 DIAGNOSIS — I10 Essential (primary) hypertension: Secondary | ICD-10-CM | POA: Diagnosis not present

## 2017-01-26 DIAGNOSIS — R600 Localized edema: Secondary | ICD-10-CM | POA: Diagnosis not present

## 2017-01-26 HISTORY — DX: Heart failure, unspecified: I50.9

## 2017-01-26 LAB — BASIC METABOLIC PANEL
Anion gap: 10 (ref 5–15)
BUN: 15 mg/dL (ref 6–20)
CALCIUM: 9.4 mg/dL (ref 8.9–10.3)
CO2: 26 mmol/L (ref 22–32)
CREATININE: 1.01 mg/dL — AB (ref 0.44–1.00)
Chloride: 100 mmol/L — ABNORMAL LOW (ref 101–111)
GFR calc Af Amer: 53 mL/min — ABNORMAL LOW (ref >60)
GFR, EST NON AFRICAN AMERICAN: 46 mL/min — AB (ref >60)
Glucose, Bld: 104 mg/dL — ABNORMAL HIGH (ref 65–99)
POTASSIUM: 4 mmol/L (ref 3.5–5.1)
SODIUM: 136 mmol/L (ref 135–145)

## 2017-01-26 LAB — PROTIME-INR
INR: 2.39
PROTHROMBIN TIME: 26.5 s — AB (ref 11.4–15.2)

## 2017-01-26 LAB — BRAIN NATRIURETIC PEPTIDE: B NATRIURETIC PEPTIDE 5: 303.2 pg/mL — AB (ref 0.0–100.0)

## 2017-01-26 LAB — CBC WITH DIFFERENTIAL/PLATELET
Basophils Absolute: 0 10*3/uL (ref 0.0–0.1)
Basophils Relative: 1 %
EOS ABS: 0.2 10*3/uL (ref 0.0–0.7)
EOS PCT: 2 %
HCT: 39.3 % (ref 36.0–46.0)
Hemoglobin: 13 g/dL (ref 12.0–15.0)
LYMPHS ABS: 1.7 10*3/uL (ref 0.7–4.0)
Lymphocytes Relative: 23 %
MCH: 29.8 pg (ref 26.0–34.0)
MCHC: 33.1 g/dL (ref 30.0–36.0)
MCV: 90.1 fL (ref 78.0–100.0)
Monocytes Absolute: 0.8 10*3/uL (ref 0.1–1.0)
Monocytes Relative: 11 %
Neutro Abs: 4.8 10*3/uL (ref 1.7–7.7)
Neutrophils Relative %: 63 %
PLATELETS: 218 10*3/uL (ref 150–400)
RBC: 4.36 MIL/uL (ref 3.87–5.11)
RDW: 13.8 % (ref 11.5–15.5)
WBC: 7.5 10*3/uL (ref 4.0–10.5)

## 2017-01-26 MED ORDER — FUROSEMIDE 20 MG PO TABS
10.0000 mg | ORAL_TABLET | Freq: Once | ORAL | Status: AC
  Administered 2017-01-26: 10 mg via ORAL
  Filled 2017-01-26: qty 1

## 2017-01-26 NOTE — ED Notes (Signed)
EDP aware of pt BP 

## 2017-01-26 NOTE — ED Provider Notes (Signed)
[facility]-EMERGENCY DEPT Provider Note   CSN: [id number] Arrival date & time: 01/26/17  1240     History   Chief Complaint Chief Complaint  Patient presents with  . Leg Swelling    HPI Angelica Mejia is a 83 y.o. female.  83 yo F with a chief complaint of shortness of breath and leg swelling. Going on for past month.  The patient had run out of her home diuretic medication. She gets it filled through an [employer]online mailing service. Is been delayed. She thinks she will get it today. Her home health nurse was concerned about her swelling and shortness of breath and the fact that there is no doctor for her to talk to his daughter should come to the ED. The patient states that she regrets this decision and does not feel she needs be evaluated.   The history is provided by the patient.  Illness  This is a new problem. The current episode started more than 1 week ago. The problem occurs constantly. The problem has been gradually worsening. Associated symptoms include shortness of breath. Pertinent negatives include no chest pain and no headaches. Nothing aggravates the symptoms. Nothing relieves the symptoms. She has tried nothing for the symptoms. The treatment provided no relief.    Past Medical History:  Diagnosis Date  . Atrial fibrillation (HCC)   . CHF (congestive heart failure) (HCC)   . Chronic anticoagulation   . Hypertension   . Raynaud phenomenon   . Tremor, essential     Patient Active Problem List   Diagnosis Date Noted  . Atrial fibrillation (HCC)   . Tremor, essential   . Chronic anticoagulation   . Hypertension   . Raynaud phenomenon     Past Surgical History:  Procedure Laterality Date  . CHOLECYSTECTOMY      OB History    No data available       Home Medications    Prior to Admission medications   Medication Sig Start Date End Date Taking? Authorizing Provider  CALCIUM PO Take 1 tablet by mouth daily.   Yes [provider]  lisinopril  (PRINIVIL,ZESTRIL) 10 MG tablet Take 1 tablet (10 mg total) by mouth daily. 05/10/16  Yes Nahser, Deloris PingPhilip J, MD  triamterene-hydrochlorothiazide (MAXZIDE-25) 37.5-25 MG tablet Take 0.5 tablets by mouth daily. 05/30/16  Yes Nahser, Deloris PingPhilip J, MD  warfarin (COUMADIN) 5 MG tablet Take as directed by Coumadin clinic. Patient taking differently: Take 2.5-5 mg by mouth See admin instructions. Pt takes 2.5 mg everyday except on Monday and Friday patient takes 5 mg 08/16/16  Yes Nahser, Deloris PingPhilip J, MD  propranolol ER (INDERAL LA) 80 MG 24 hr capsule Take 1 capsule (80 mg total) by mouth daily. Patient not taking: Reported on 01/26/2017 05/01/16   Nilda RiggsMartin, Nancy Carolyn, NP    Family History Family History  Problem Relation Age of Onset  . Hypertension Father   . Heart attack Father   . Heart disease Unknown   . Hypertension Unknown   . Stroke Unknown   . Cancer Unknown     Social History Social History  Substance Use Topics  . Smoking status: Never Smoker  . Smokeless tobacco: Never Used  . Alcohol use No     Allergies   Other   Review of Systems Review of Systems  Constitutional: Negative for chills and fever.  HENT: Negative for congestion and rhinorrhea.   Eyes: Negative for redness and visual disturbance.  Respiratory: Positive for shortness of breath. Negative for  wheezing.   Cardiovascular: Positive for leg swelling. Negative for chest pain and palpitations.  Gastrointestinal: Negative for nausea and vomiting.  Genitourinary: Negative for dysuria and urgency.  Musculoskeletal: Negative for arthralgias and myalgias.  Skin: Negative for pallor and wound.  Neurological: Negative for dizziness and headaches.     Physical Exam Updated Vital Signs BP (!) 195/96   Pulse 75   Temp 98.1 F (36.7 C) (Oral)   Resp 13   SpO2 94%   Physical Exam  Constitutional: She is oriented to person, place, and time. She appears well-developed and well-nourished. No distress.  HENT:  Head:  Normocephalic and atraumatic.  JVP just above clavicle   Eyes: EOM are normal. Pupils are equal, round, and reactive to light.  Neck: Normal range of motion. Neck supple.  Cardiovascular: Normal rate and regular rhythm.  Exam reveals no gallop and no friction rub.   No murmur heard. Pulmonary/Chest: Effort normal. She has no wheezes. She has rales (faint in the bases).  Abdominal: Soft. She exhibits no distension. There is no tenderness.  Musculoskeletal: She exhibits no edema (trace BLE) or tenderness.  Neurological: She is alert and oriented to person, place, and time.  Skin: Skin is warm and dry. She is not diaphoretic.  Psychiatric: She has a normal mood and affect. Her behavior is normal.  Nursing note and vitals reviewed.    ED Treatments / Results  Labs (all labs ordered are listed, but only abnormal results are displayed) Labs Reviewed  BASIC METABOLIC PANEL - Abnormal; Notable for the following:       Result Value   Chloride 100 (*)    Glucose, Bld 104 (*)    Creatinine, Ser 1.01 (*)    GFR calc non Af Amer 46 (*)    GFR calc Af Amer 53 (*)    All other components within normal limits  BRAIN NATRIURETIC PEPTIDE - Abnormal; Notable for the following:    B Natriuretic Peptide 303.2 (*)    All other components within normal limits  PROTIME-INR - Abnormal; Notable for the following:    Prothrombin Time 26.5 (*)    All other components within normal limits  CBC WITH DIFFERENTIAL/PLATELET    EKG  EKG Interpretation  Date/Time:  Friday January 26 2017 13:11:06 EDT Ventricular Rate:  93 PR Interval:    QRS Duration: 93 QT Interval:  379 QTC Calculation: 472 R Axis:   34 Text Interpretation:  Wandering atrial pacemaker Anteroseptal infarct, old Nonspecific repol abnormality, diffuse leads No significant change since last tracing Confirmed by Melene Plan [phone]) on 01/26/2017 2:08:41 PM       Radiology Dg Chest 2 View  Result Date: 01/26/2017 CLINICAL DATA:   Bilateral lower extremity swelling. EXAM: CHEST  2 VIEW COMPARISON:  None. FINDINGS: Small to moderate pleural effusions are seen, greater on the right. Right basilar airspace disease is also identified. No pneumothorax. There is cardiomegaly and mild vascular congestion. Atherosclerosis is noted. No acute bony abnormality. The patient is status post vertebral augmentation for a remote L2 fracture. IMPRESSION: Small to moderate bilateral pleural effusions and basilar airspace disease, worse on the right. Cardiomegaly and vascular congestion. Atherosclerosis. Electronically Signed   By: Drusilla Kanner M.D.   On: 01/26/2017 14:41    Procedures Procedures (including critical care time)  Medications Ordered in ED Medications  furosemide (LASIX) tablet 10 mg (not administered)     Initial Impression / Assessment and Plan / ED Course  I have reviewed the triage  vital signs and the nursing notes.  Pertinent labs & imaging results that were available during my care of the patient were reviewed by me and considered in my medical decision making (see chart for details).     83 yo F With a chief complaints of lower extremity edema and shortness of breath. Laboratory workup with very small pleural effusions as well as a BNP of 300. I'll give her 1 dose of Lasix here. We'll have her follow-up with her primary care physician.  3:29 PM:  I have discussed the diagnosis/risks/treatment options with the patient and believe the pt to be eligible for discharge home to follow-up with PCP. We also discussed returning to the ED immediately if new or worsening sx occur. We discussed the sx which are most concerning (e.g., sudden worsening pain, fever, inability to tolerate by mouth) that necessitate immediate return. Medications administered to the patient during their visit and any new prescriptions provided to the patient are listed below.  Medications given during this visit Medications  furosemide (LASIX)  tablet 10 mg (not administered)     The patient appears reasonably screen and/or stabilized for discharge and I doubt any other medical condition or other [facility] requiring further screening, evaluation, or treatment in the ED at this time prior to discharge.    Final Clinical Impressions(s) / ED Diagnoses   Final diagnoses:  Peripheral edema    New Prescriptions New Prescriptions   No medications on file     Melene Plan, DO 01/26/17 1529

## 2017-01-26 NOTE — ED Notes (Signed)
Pt reports she has has no transportation home and is unable to call family. This nurse spoke with case management, social worker to come speak with pt regarding cab voucher.

## 2017-01-26 NOTE — Telephone Encounter (Signed)
New message     Patient c/o Palpitations:  High priority if patient c/o lightheadedness and shortness of breath.  1. How long have you been having palpitations? A couple days  2. Are you currently experiencing lightheadedness and shortness of breath? Yes, at times  3. Have you checked your BP and heart rate? (document readings) no   4. Are you experiencing any other symptoms? No

## 2017-01-26 NOTE — Telephone Encounter (Signed)
Called patient back. Patient stated she saw the nurse at her assisted living facility last night, who instructed her to see her Cardiologist today. Patient is complaining about palpitations and having SOB at times. Patient stated " my heart is doing flip flops". Patient was unable to give any information about her HR or BP. Patient wants to only see Dr. Elease HashimotoNahser. Informed patient that Dr. Elease HashimotoNahser is out of the office today and next week. Patient is not interested at this time in seeing an APP. Patient stated she would see the nurse at the facility this morning and get back with our office.

## 2017-01-26 NOTE — ED Triage Notes (Signed)
Pt presents for evaluation of bilateral ankle swelling. Pt states was seen by pcp for eval of restarting fluid pills, states has not been on them for 3 weeks due to them being sent by mail. Pt denies SOB/CP.

## 2017-02-13 ENCOUNTER — Ambulatory Visit (INDEPENDENT_AMBULATORY_CARE_PROVIDER_SITE_OTHER): Payer: Self-pay | Admitting: Internal Medicine

## 2017-02-13 DIAGNOSIS — I482 Chronic atrial fibrillation, unspecified: Secondary | ICD-10-CM

## 2017-02-13 DIAGNOSIS — Z7901 Long term (current) use of anticoagulants: Secondary | ICD-10-CM

## 2017-02-13 LAB — POCT INR: INR: 5

## 2017-02-20 ENCOUNTER — Ambulatory Visit (INDEPENDENT_AMBULATORY_CARE_PROVIDER_SITE_OTHER): Payer: Self-pay | Admitting: Internal Medicine

## 2017-02-20 DIAGNOSIS — I482 Chronic atrial fibrillation, unspecified: Secondary | ICD-10-CM

## 2017-02-20 DIAGNOSIS — Z7901 Long term (current) use of anticoagulants: Secondary | ICD-10-CM

## 2017-02-20 LAB — POCT INR: INR: 2.3

## 2017-02-26 DIAGNOSIS — I482 Chronic atrial fibrillation: Secondary | ICD-10-CM | POA: Diagnosis not present

## 2017-02-26 DIAGNOSIS — Z6820 Body mass index (BMI) 20.0-20.9, adult: Secondary | ICD-10-CM | POA: Diagnosis not present

## 2017-02-26 DIAGNOSIS — R2689 Other abnormalities of gait and mobility: Secondary | ICD-10-CM | POA: Diagnosis not present

## 2017-02-26 DIAGNOSIS — R600 Localized edema: Secondary | ICD-10-CM | POA: Diagnosis not present

## 2017-02-26 DIAGNOSIS — I10 Essential (primary) hypertension: Secondary | ICD-10-CM | POA: Diagnosis not present

## 2017-02-26 DIAGNOSIS — R05 Cough: Secondary | ICD-10-CM | POA: Diagnosis not present

## 2017-02-26 DIAGNOSIS — R251 Tremor, unspecified: Secondary | ICD-10-CM | POA: Diagnosis not present

## 2017-03-05 ENCOUNTER — Ambulatory Visit (INDEPENDENT_AMBULATORY_CARE_PROVIDER_SITE_OTHER): Payer: Self-pay | Admitting: Internal Medicine

## 2017-03-05 DIAGNOSIS — I482 Chronic atrial fibrillation, unspecified: Secondary | ICD-10-CM

## 2017-03-05 DIAGNOSIS — Z7901 Long term (current) use of anticoagulants: Secondary | ICD-10-CM

## 2017-03-05 LAB — POCT INR: INR: 2.4

## 2017-03-13 DIAGNOSIS — Z4789 Encounter for other orthopedic aftercare: Secondary | ICD-10-CM | POA: Diagnosis not present

## 2017-03-13 DIAGNOSIS — Z9181 History of falling: Secondary | ICD-10-CM | POA: Diagnosis not present

## 2017-03-13 DIAGNOSIS — R278 Other lack of coordination: Secondary | ICD-10-CM | POA: Diagnosis not present

## 2017-03-13 DIAGNOSIS — R531 Weakness: Secondary | ICD-10-CM | POA: Diagnosis not present

## 2017-03-13 DIAGNOSIS — R41841 Cognitive communication deficit: Secondary | ICD-10-CM | POA: Diagnosis not present

## 2017-03-13 DIAGNOSIS — M6281 Muscle weakness (generalized): Secondary | ICD-10-CM | POA: Diagnosis not present

## 2017-03-13 DIAGNOSIS — R2681 Unsteadiness on feet: Secondary | ICD-10-CM | POA: Diagnosis not present

## 2017-03-14 DIAGNOSIS — Z4789 Encounter for other orthopedic aftercare: Secondary | ICD-10-CM | POA: Diagnosis not present

## 2017-03-14 DIAGNOSIS — R531 Weakness: Secondary | ICD-10-CM | POA: Diagnosis not present

## 2017-03-14 DIAGNOSIS — Z9181 History of falling: Secondary | ICD-10-CM | POA: Diagnosis not present

## 2017-03-14 DIAGNOSIS — R278 Other lack of coordination: Secondary | ICD-10-CM | POA: Diagnosis not present

## 2017-03-14 DIAGNOSIS — R41841 Cognitive communication deficit: Secondary | ICD-10-CM | POA: Diagnosis not present

## 2017-03-14 DIAGNOSIS — M6281 Muscle weakness (generalized): Secondary | ICD-10-CM | POA: Diagnosis not present

## 2017-03-19 DIAGNOSIS — R278 Other lack of coordination: Secondary | ICD-10-CM | POA: Diagnosis not present

## 2017-03-19 DIAGNOSIS — M6281 Muscle weakness (generalized): Secondary | ICD-10-CM | POA: Diagnosis not present

## 2017-03-19 DIAGNOSIS — R531 Weakness: Secondary | ICD-10-CM | POA: Diagnosis not present

## 2017-03-19 DIAGNOSIS — Z9181 History of falling: Secondary | ICD-10-CM | POA: Diagnosis not present

## 2017-03-19 DIAGNOSIS — Z4789 Encounter for other orthopedic aftercare: Secondary | ICD-10-CM | POA: Diagnosis not present

## 2017-03-19 DIAGNOSIS — R41841 Cognitive communication deficit: Secondary | ICD-10-CM | POA: Diagnosis not present

## 2017-03-20 DIAGNOSIS — M6281 Muscle weakness (generalized): Secondary | ICD-10-CM | POA: Diagnosis not present

## 2017-03-20 DIAGNOSIS — Z4789 Encounter for other orthopedic aftercare: Secondary | ICD-10-CM | POA: Diagnosis not present

## 2017-03-20 DIAGNOSIS — R41841 Cognitive communication deficit: Secondary | ICD-10-CM | POA: Diagnosis not present

## 2017-03-20 DIAGNOSIS — R278 Other lack of coordination: Secondary | ICD-10-CM | POA: Diagnosis not present

## 2017-03-20 DIAGNOSIS — R531 Weakness: Secondary | ICD-10-CM | POA: Diagnosis not present

## 2017-03-20 DIAGNOSIS — Z9181 History of falling: Secondary | ICD-10-CM | POA: Diagnosis not present

## 2017-03-21 DIAGNOSIS — Z961 Presence of intraocular lens: Secondary | ICD-10-CM | POA: Diagnosis not present

## 2017-03-21 DIAGNOSIS — H1032 Unspecified acute conjunctivitis, left eye: Secondary | ICD-10-CM | POA: Diagnosis not present

## 2017-03-21 DIAGNOSIS — Z947 Corneal transplant status: Secondary | ICD-10-CM | POA: Diagnosis not present

## 2017-03-26 ENCOUNTER — Ambulatory Visit (INDEPENDENT_AMBULATORY_CARE_PROVIDER_SITE_OTHER): Payer: Medicare Other | Admitting: Internal Medicine

## 2017-03-26 DIAGNOSIS — I482 Chronic atrial fibrillation, unspecified: Secondary | ICD-10-CM

## 2017-03-26 DIAGNOSIS — Z7901 Long term (current) use of anticoagulants: Secondary | ICD-10-CM

## 2017-03-26 LAB — POCT INR: INR: 1.9

## 2017-03-27 DIAGNOSIS — R41841 Cognitive communication deficit: Secondary | ICD-10-CM | POA: Diagnosis not present

## 2017-03-27 DIAGNOSIS — Z4789 Encounter for other orthopedic aftercare: Secondary | ICD-10-CM | POA: Diagnosis not present

## 2017-03-27 DIAGNOSIS — R531 Weakness: Secondary | ICD-10-CM | POA: Diagnosis not present

## 2017-03-27 DIAGNOSIS — R278 Other lack of coordination: Secondary | ICD-10-CM | POA: Diagnosis not present

## 2017-03-27 DIAGNOSIS — Z9181 History of falling: Secondary | ICD-10-CM | POA: Diagnosis not present

## 2017-03-27 DIAGNOSIS — M6281 Muscle weakness (generalized): Secondary | ICD-10-CM | POA: Diagnosis not present

## 2017-03-30 DIAGNOSIS — M6281 Muscle weakness (generalized): Secondary | ICD-10-CM | POA: Diagnosis not present

## 2017-03-30 DIAGNOSIS — Z9181 History of falling: Secondary | ICD-10-CM | POA: Diagnosis not present

## 2017-03-30 DIAGNOSIS — R531 Weakness: Secondary | ICD-10-CM | POA: Diagnosis not present

## 2017-03-30 DIAGNOSIS — R41841 Cognitive communication deficit: Secondary | ICD-10-CM | POA: Diagnosis not present

## 2017-03-30 DIAGNOSIS — Z4789 Encounter for other orthopedic aftercare: Secondary | ICD-10-CM | POA: Diagnosis not present

## 2017-03-30 DIAGNOSIS — R278 Other lack of coordination: Secondary | ICD-10-CM | POA: Diagnosis not present

## 2017-04-06 ENCOUNTER — Other Ambulatory Visit: Payer: Self-pay | Admitting: Internal Medicine

## 2017-04-06 DIAGNOSIS — Z1231 Encounter for screening mammogram for malignant neoplasm of breast: Secondary | ICD-10-CM

## 2017-04-10 ENCOUNTER — Ambulatory Visit (INDEPENDENT_AMBULATORY_CARE_PROVIDER_SITE_OTHER): Payer: Medicare Other | Admitting: Cardiology

## 2017-04-10 DIAGNOSIS — Z7901 Long term (current) use of anticoagulants: Secondary | ICD-10-CM

## 2017-04-10 DIAGNOSIS — I4891 Unspecified atrial fibrillation: Secondary | ICD-10-CM | POA: Diagnosis not present

## 2017-04-10 DIAGNOSIS — I482 Chronic atrial fibrillation, unspecified: Secondary | ICD-10-CM

## 2017-04-10 DIAGNOSIS — N390 Urinary tract infection, site not specified: Secondary | ICD-10-CM | POA: Diagnosis not present

## 2017-04-10 LAB — PROTIME-INR: INR: 1.8 — AB (ref 0.9–1.1)

## 2017-04-13 ENCOUNTER — Other Ambulatory Visit: Payer: Self-pay | Admitting: Cardiovascular Disease

## 2017-04-18 ENCOUNTER — Ambulatory Visit (INDEPENDENT_AMBULATORY_CARE_PROVIDER_SITE_OTHER): Payer: Medicare Other | Admitting: Interventional Cardiology

## 2017-04-18 DIAGNOSIS — I482 Chronic atrial fibrillation, unspecified: Secondary | ICD-10-CM

## 2017-04-18 DIAGNOSIS — Z5181 Encounter for therapeutic drug level monitoring: Secondary | ICD-10-CM | POA: Diagnosis not present

## 2017-04-18 DIAGNOSIS — N179 Acute kidney failure, unspecified: Secondary | ICD-10-CM | POA: Diagnosis not present

## 2017-04-18 DIAGNOSIS — Z7901 Long term (current) use of anticoagulants: Secondary | ICD-10-CM

## 2017-04-18 LAB — PROTIME-INR: INR: 1.6 — AB (ref 0.9–1.1)

## 2017-04-19 DIAGNOSIS — Z5181 Encounter for therapeutic drug level monitoring: Secondary | ICD-10-CM | POA: Insufficient documentation

## 2017-04-19 DIAGNOSIS — Z7189 Other specified counseling: Secondary | ICD-10-CM | POA: Insufficient documentation

## 2017-04-23 DIAGNOSIS — M25512 Pain in left shoulder: Secondary | ICD-10-CM | POA: Diagnosis not present

## 2017-04-24 ENCOUNTER — Encounter: Payer: Self-pay | Admitting: Internal Medicine

## 2017-04-24 ENCOUNTER — Encounter: Payer: Medicare Other | Admitting: Internal Medicine

## 2017-04-24 ENCOUNTER — Non-Acute Institutional Stay: Payer: Medicare Other | Admitting: Internal Medicine

## 2017-04-24 DIAGNOSIS — I10 Essential (primary) hypertension: Secondary | ICD-10-CM

## 2017-04-24 DIAGNOSIS — Z9889 Other specified postprocedural states: Secondary | ICD-10-CM

## 2017-04-24 DIAGNOSIS — I482 Chronic atrial fibrillation, unspecified: Secondary | ICD-10-CM

## 2017-04-24 DIAGNOSIS — Z7901 Long term (current) use of anticoagulants: Secondary | ICD-10-CM | POA: Diagnosis not present

## 2017-04-24 DIAGNOSIS — S42032A Displaced fracture of lateral end of left clavicle, initial encounter for closed fracture: Secondary | ICD-10-CM | POA: Diagnosis not present

## 2017-04-24 DIAGNOSIS — G250 Essential tremor: Secondary | ICD-10-CM

## 2017-04-24 DIAGNOSIS — W19XXXA Unspecified fall, initial encounter: Secondary | ICD-10-CM

## 2017-04-24 DIAGNOSIS — M792 Neuralgia and neuritis, unspecified: Secondary | ICD-10-CM

## 2017-04-24 NOTE — Progress Notes (Signed)
Provider:  Oneal Grout MD  Location:  [facility] Room Number: 806 Place of Service:  ALF [phone])  PCP: Jarome Matin, MD Patient Care Team: Jarome Matin, MD as PCP - General (Internal Medicine)  Extended Emergency Contact Information Primary Emergency Contact: Morgan,Tom Address: [address]          [place], [place] [address] [place] Home Phone: [phone] Relation: Friend Secondary Emergency Contact: Brandy Hale [place] Home Phone: [phone] Relation: Niece  Code Status: Full Code Goals of Care: Advanced Directive information Advanced Directives 07/04/2016  Does Patient Have a Medical Advance Directive? No      Chief Complaint  Patient presents with  . New Patient (Initial Visit)    Establishing care with [facility].     HPI: Patient is a 83 y.o. female seen today for new patient visit. She was under care of Dr Ivery Quale before this. She has medical history of afib, CHF, HTN and essential tremor. She is followed by Dr Patty Sermons from cardiology. She is on chronic anticoagulation and tolerating it well. She had a fall yesterday evening and hurt her shoulder. With complaints of pain with bruising, xray of left shoulder was obtained and she was noted to have acute minimally displaced fracture of distal clavicle. She is seen in her room today. She complaints of pain to her left shoulder with movement.   Past Medical History:  Diagnosis Date  . Anticoagulant long-term use   . Atrial fibrillation (HCC)   . CHF (congestive heart failure) (HCC)   . Chronic anticoagulation   . Hypertension   . Raynaud phenomenon   . Tremor, essential    Past Surgical History:  Procedure Laterality Date  . CHOLECYSTECTOMY    . TONSILLECTOMY      reports that she has never smoked. She has never used smokeless tobacco. She reports that she does not drink alcohol or use drugs. Social History    Social History  . Marital status: Widowed    Spouse name: N/A  . Number of children: 0  . Years of education: 63   Occupational History  . RetiredNurse, mental health education    Social History Main Topics  . Smoking status: Never Smoker  . Smokeless tobacco: Never Used  . Alcohol use No  . Drug use: No  . Sexual activity: Not on file   Other Topics Concern  . Not on file   Social History Narrative   Tobacco use, amount per day now: No      Past tobacco use, amount per day:      How many years did you use tobacco:      Alcohol use (drinks per week): occasional wine      Diet:      Do you drink/eat things with caffeine? Decaf coffee.      Marital status: Widowed            What year were you married?      Do you live in a house, apartment, assisted living, condo, trailer? Assisted living      Is it one or more stories? 1      How many persons live in your home? 1      Do you have any pets in your home? 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       Do you exercise? No  How often?      Do you have a living will?       Do you have a DNR form? No           If not, do you want to discuss one? Yes      Do you have signed POA/HPOA forms? Yes                    Functional Status Survey:    Family History  Problem Relation Age of Onset  . Hypertension Father   . Heart attack Father   . Heart disease Unknown   . Hypertension Unknown   . Stroke Unknown   . Cancer Unknown     Health Maintenance  Topic Date Due  . TETANUS/TDAP  06/02/1940  . PNA vac Low Risk Adult (1 of 2 - PCV13) 06/02/1986  . INFLUENZA VACCINE  03/07/2017  . DEXA SCAN  Completed    Allergies  Allergen Reactions  . Fosamax [Alendronate]   . Other Nausea And Vomiting    Green pepper    Outpatient Encounter Prescriptions as of 04/24/2017  Medication Sig  . acetaminophen (TYLENOL) 325 MG tablet Take 650 mg by mouth every 4 (four) hours as needed.  . calcium  carbonate (OSCAL) 1500 (600 Ca) MG TABS tablet Take 600 mg of elemental calcium by mouth 2 (two) times daily with a meal.  . gabapentin (NEURONTIN) 100 MG capsule Take 100 mg by mouth 2 (two) times daily.  Marland Kitchen lisinopril (PRINIVIL,ZESTRIL) 10 MG tablet TAKE 1 TABLET BY MOUTH  DAILY  . propranolol ER (INDERAL LA) 80 MG 24 hr capsule TAKE 1 CAPSULE BY MOUTH  DAILY  . traMADol (ULTRAM) 50 MG tablet Take 50 mg by mouth 3 (three) times daily as needed.  . triamterene-hydrochlorothiazide (MAXZIDE-25) 37.5-25 MG tablet Take 1 tablet by mouth daily.  Marland Kitchen UNABLE TO FIND Med Name: Vicks vapor rub. Apply under nostrils 2 times daily as needed.  . warfarin (COUMADIN) 2.5 MG tablet Take 2.5 mg by mouth daily. On Tuesday, Thursday and Saturday.  . warfarin (COUMADIN) 5 MG tablet Take 5 mg by mouth daily. On Sunday, Monday, Wednesday and Friday.   No facility-administered encounter medications on file as of 04/24/2017.     Review of Systems  Constitutional: Negative for appetite change, chills, diaphoresis, fatigue and fever.       Energy level is fair.  HENT: Positive for hearing loss. Negative for congestion, ear pain, mouth sores, nosebleeds, rhinorrhea, sinus pressure, sore throat and trouble swallowing.   Eyes: Negative for pain and visual disturbance.       Wears glasses.   Respiratory: Negative for cough, choking, shortness of breath and wheezing.   Cardiovascular: Negative for chest pain, palpitations and leg swelling.  Gastrointestinal: Negative for abdominal pain, blood in stool, constipation, diarrhea, nausea and vomiting.       Last bowel movement was yesterday.   Genitourinary: Positive for frequency. Negative for dysuria, hematuria and pelvic pain.  Musculoskeletal: Positive for arthralgias, back pain and gait problem.  Skin: Negative for rash and wound.  Neurological: Positive for tremors. Negative for dizziness, syncope, light-headedness, numbness and headaches.  Hematological: Bruises/bleeds  easily.  Psychiatric/Behavioral: Negative for behavioral problems and confusion. The patient is not nervous/anxious.     Vitals:   04/24/17 1550  BP: 130/80  Pulse: 70  Resp: 18  Temp: (!) 97.4 F (36.3 C)  TempSrc: Oral  SpO2: 97%  Weight: 128 lb (58.1 kg)  Height:  (1.676 m)   Body mass index is 20.66 kg/m. Physical Exam  Constitutional: She is oriented to person, place, and time. She appears well-developed and well-nourished. No distress.  HENT:  Head: Normocephalic and atraumatic.  Mouth/Throat: Oropharynx is clear and moist. No oropharyngeal exudate.  Eyes: Pupils are equal, round, and reactive to light. Conjunctivae and EOM are normal. Right eye exhibits no discharge. Left eye exhibits no discharge.  Neck: Normal range of motion. Neck supple.  Cardiovascular:  Murmur heard. Irregular heart rate  Pulmonary/Chest: Effort normal and breath sounds normal. No respiratory distress. She has no wheezes. She has no rales. She exhibits no tenderness.  Abdominal: Soft. Bowel sounds are normal. She exhibits no distension. There is no tenderness. There is no rebound and no guarding.  Musculoskeletal: She exhibits no edema.  Limited movement to left shoulder with minimal abduction causing pain, good ROM to left elbow and wrist. Able to move other extremities. Has rolling walker  Lymphadenopathy:    She has no cervical adenopathy.  Neurological: She is alert and oriented to person, place, and time.  Skin: Skin is warm and dry. She is not diaphoretic.  Bruise to left shoulder and anterior chest wall area  Psychiatric: She has a normal mood and affect. Her behavior is normal.    Labs reviewed: Basic Metabolic Panel:  Recent Labs  16/10/96 1304 06/27/16 1130 07/04/16 1131 01/26/17 1317  NA 136 133* 133* 136  K 4.4 4.1 3.9 4.0  CL 96* 93* 91* 100*  CO2 30 33*  --  26  GLUCOSE 85 67 103* 104*  BUN 19 25 22* 15  CREATININE 0.95* 1.12* 1.00 1.01*  CALCIUM 9.9 9.6  --  9.4    Liver Function Tests: No results for input(s): AST, ALT, ALKPHOS, BILITOT, PROT, ALBUMIN in the last 8760 hours. No results for input(s): LIPASE, AMYLASE in the last 8760 hours. No results for input(s): AMMONIA in the last 8760 hours. CBC:  Recent Labs  07/04/16 1131 01/26/17 1317  WBC  --  7.5  NEUTROABS  --  4.8  HGB 15.0 13.0  HCT 44.0 39.3  MCV  --  90.1  PLT  --  218   Cardiac Enzymes: No results for input(s): CKTOTAL, CKMB, CKMBINDEX, TROPONINI in the last 8760 hours. BNP: Invalid input(s): POCBNP No results found for: HGBA1C Lab Results  Component Value Date   TSH 0.64 10/17/2012   No results found for: VITAMINB12 No results found for: FOLATE No results found for: IRON, TIBC, FERRITIN  Imaging and Procedures obtained prior to SNF admission: Dg Chest 2 View  Result Date: 01/26/2017 CLINICAL DATA:  Bilateral lower extremity swelling. EXAM: CHEST  2 VIEW COMPARISON:  None. FINDINGS: Small to moderate pleural effusions are seen, greater on the right. Right basilar airspace disease is also identified. No pneumothorax. There is cardiomegaly and mild vascular congestion. Atherosclerosis is noted. No acute bony abnormality. The patient is status post vertebral augmentation for a remote L2 fracture. IMPRESSION: Small to moderate bilateral pleural effusions and basilar airspace disease, worse on the right. Cardiomegaly and vascular congestion. Atherosclerosis. Electronically Signed   By: Drusilla Kanner M.D.   On: 01/26/2017 14:41   Left Shoulder 2 Views 04/23/17 Impression. Fracture of the clavicle- minimally displaced fracture of distal clavicle. No evidence of dislocation.    Assessment/Plan  Fall initial encounter Had a fall last evening and has sustained fracture see below. Will obtain PT consult for evaluation and strengthening exercises to strengthen her  gait and improve balance. No signs of infection on exam. Her neuropathy, unsteady gait and poor balance could  have contributed to her fall.  Left distal clavicle fracture Minimally displaced. Will have his arm in sling for now and have orthopedic to evaluate. No dislocation noted on imaging. Tylenol 1000 mg po bid and tramadol 50 mg q8h prn severe pain for now. D/c prn tylenol order.   afib Controlled HR. Continue propranolol 80 mg daily for rate control and warfarin for stroke prophylaxis  Chronic anticoagulation Continue coumadin with goal inr 2-3, monitor for bleeding  Neuropathic pain Continue gabapentin 100 mg bid and monitor  Hypertension Currently on triamterene-hctz 37.5-25 mg daily with lisinopril 10 mg daily. With recent fall, check bp bid and adjust BP meds if needed with goal BP of <140/90 but avoid tight control with her age and fall risk.  Essential tremor Continue propranolol, no changes made  History of vertebral fracture S/p kyphoplasty. Denies pain this visit.   Family/ staff Communication: reviewed care plan with patient and charge nurse.    Labs/tests ordered: cbc with diff, cmp  Oneal Grout, MD Internal Medicine [facility] [address] [place], [place] [address] Cell Phone (Monday-Friday 8 am - 5 pm): [phone] On Call: [phone] and follow prompts after 5 pm and on weekends Office Phone: [phone] Office Fax: [phone]

## 2017-04-25 ENCOUNTER — Ambulatory Visit (INDEPENDENT_AMBULATORY_CARE_PROVIDER_SITE_OTHER): Payer: Medicare Other | Admitting: Orthopedic Surgery

## 2017-04-25 ENCOUNTER — Encounter (INDEPENDENT_AMBULATORY_CARE_PROVIDER_SITE_OTHER): Payer: Self-pay | Admitting: Orthopedic Surgery

## 2017-04-25 DIAGNOSIS — Z4789 Encounter for other orthopedic aftercare: Secondary | ICD-10-CM | POA: Diagnosis not present

## 2017-04-25 DIAGNOSIS — S42002A Fracture of unspecified part of left clavicle, initial encounter for closed fracture: Secondary | ICD-10-CM

## 2017-04-25 DIAGNOSIS — R531 Weakness: Secondary | ICD-10-CM | POA: Diagnosis not present

## 2017-04-25 DIAGNOSIS — M6281 Muscle weakness (generalized): Secondary | ICD-10-CM | POA: Diagnosis not present

## 2017-04-25 DIAGNOSIS — Z9181 History of falling: Secondary | ICD-10-CM | POA: Diagnosis not present

## 2017-04-25 DIAGNOSIS — R2681 Unsteadiness on feet: Secondary | ICD-10-CM | POA: Diagnosis not present

## 2017-04-25 DIAGNOSIS — R278 Other lack of coordination: Secondary | ICD-10-CM | POA: Diagnosis not present

## 2017-04-25 NOTE — Progress Notes (Signed)
Office Visit Note   Patient: Angelica Mejia           Date of Birth: 02/02/1919           MRN: [id number] Visit Date: 04/25/2017 Requested by: Jarome Matin, MD [address] [place], [place] [address] PCP: Oneal Grout, MD  Subjective: Chief Complaint  Patient presents with  . Clavicle Injury    left clavicle injury s/p fall on 04/23/17    HPI: Angelica Mejia is a 83 year old female with left shoulder pain.  Patient is on Coumadin.  She fell 04/23/2017.  Landed on her left side after the fall.  She is in a sling.  X-ray report from the nursing home demonstrates nondisplaced lateral clavicle fracture.  She is right-hand dominant.  She uses a rolling walker to ambulate but does not weight-bear through either arm.  She had radiographs done at friend's home.  She is right-hand dominant.              ROS: All systems reviewed are negative as they relate to the chief complaint within the history of present illness.  Patient denies  fevers or chills.   Assessment & Plan: Visit Diagnoses: No diagnosis found.  Plan: Impression is minimally displaced lateral clavicle fracture in a 83 year old female.  Semination demonstrates no other injuries in terms of painless range of motion of both hips.  Motor sensory function to the left hand is intact.  Patient did not lose consciousness.  Plan is sling immobilization for 3 weeks but she can come out of the sling in order to safely ambulate with her rolling walker.  Repeat radiographs in 3 weeks with likely release at that time.  Do not see an operative indication in the shoulder at this time.  Continue with conservative measures locally to the area of tenderness such as ice 20 minutes 2-3 times a day.  Follow-Up Instructions: Return in about 3 weeks (around 05/16/2017).   Orders:  No orders of the defined types were placed in this encounter.  No orders of the defined types were placed in this encounter.     Procedures: No procedures  performed   Clinical Data: No additional findings.  Objective: Vital Signs: There were no vitals taken for this visit.  Physical Exam:   Constitutional: Patient appears well-developed HEENT:  Head: Normocephalic Eyes:EOM are normal Neck: Normal range of motion Cardiovascular: Normal rate Pulmonary/chest: Effort normal Neurologic: Patient is alert Skin: Skin is warm Psychiatric: Patient has normal mood and affect    Ortho Exam: Orthopedic exam demonstrates bruising around the left clavicle region.  Radial pulses intact.  EPL FPL interosseous function is intact.  Wrist and elbow range of motion nontender without crepitus.  No other masses lymph adenopathy or skin changes noted in the left shoulder girdle region.  Both hips are reviewed and examined.  No groin pain with internal/external rotation of either hip.  No knee effusion in either leg.  Specialty Comments:  No specialty comments available.  Imaging: No results found.   PMFS History: Patient Active Problem List   Diagnosis Date Noted  . Encounter for therapeutic drug monitoring 04/19/2017  . Atrial fibrillation (HCC)   . Tremor, essential   . Chronic anticoagulation   . Hypertension   . Raynaud phenomenon    Past Medical History:  Diagnosis Date  . Anticoagulant long-term use   . Atrial fibrillation (HCC)   . CHF (congestive heart failure) (HCC)   . Chronic anticoagulation   . Hypertension   .  Raynaud phenomenon   . Tremor, essential     Family History  Problem Relation Age of Onset  . Hypertension Father   . Heart attack Father   . Heart disease Unknown   . Hypertension Unknown   . Stroke Unknown   . Cancer Unknown     Past Surgical History:  Procedure Laterality Date  . CHOLECYSTECTOMY    . TONSILLECTOMY     Social History   Occupational History  . RetiredNurse, mental health education    Social History Main Topics  . Smoking status: Never Smoker  . Smokeless tobacco: Never Used  . Alcohol use  No  . Drug use: No  . Sexual activity: Not on file

## 2017-04-26 ENCOUNTER — Ambulatory Visit (INDEPENDENT_AMBULATORY_CARE_PROVIDER_SITE_OTHER): Payer: Medicare Other | Admitting: Orthopedic Surgery

## 2017-04-26 DIAGNOSIS — R531 Weakness: Secondary | ICD-10-CM | POA: Diagnosis not present

## 2017-04-26 DIAGNOSIS — I10 Essential (primary) hypertension: Secondary | ICD-10-CM | POA: Diagnosis not present

## 2017-04-26 DIAGNOSIS — R278 Other lack of coordination: Secondary | ICD-10-CM | POA: Diagnosis not present

## 2017-04-26 DIAGNOSIS — Z9181 History of falling: Secondary | ICD-10-CM | POA: Diagnosis not present

## 2017-04-26 DIAGNOSIS — M6281 Muscle weakness (generalized): Secondary | ICD-10-CM | POA: Diagnosis not present

## 2017-04-26 DIAGNOSIS — Z4789 Encounter for other orthopedic aftercare: Secondary | ICD-10-CM | POA: Diagnosis not present

## 2017-04-26 DIAGNOSIS — R2681 Unsteadiness on feet: Secondary | ICD-10-CM | POA: Diagnosis not present

## 2017-04-26 LAB — BASIC METABOLIC PANEL
BUN: 28 — AB (ref 4–21)
Creatinine: 1.2 — AB (ref 0.5–1.1)
GLUCOSE: 84
Potassium: 4.3 (ref 3.4–5.3)
Sodium: 126 — AB (ref 137–147)

## 2017-04-26 LAB — HEPATIC FUNCTION PANEL
ALT: 8 (ref 7–35)
AST: 15 (ref 13–35)
Alkaline Phosphatase: 34 (ref 25–125)
Bilirubin, Total: 0.5

## 2017-04-26 LAB — CBC AND DIFFERENTIAL
HEMATOCRIT: 31 — AB (ref 36–46)
HEMOGLOBIN: 10.6 — AB (ref 12.0–16.0)
PLATELETS: 209 (ref 150–399)
WBC: 9.1

## 2017-04-27 ENCOUNTER — Encounter: Payer: Self-pay | Admitting: Internal Medicine

## 2017-04-27 ENCOUNTER — Non-Acute Institutional Stay: Payer: Medicare Other | Admitting: Internal Medicine

## 2017-04-27 DIAGNOSIS — I4891 Unspecified atrial fibrillation: Secondary | ICD-10-CM | POA: Diagnosis not present

## 2017-04-27 DIAGNOSIS — N289 Disorder of kidney and ureter, unspecified: Secondary | ICD-10-CM | POA: Diagnosis not present

## 2017-04-27 DIAGNOSIS — Z9181 History of falling: Secondary | ICD-10-CM | POA: Diagnosis not present

## 2017-04-27 DIAGNOSIS — I10 Essential (primary) hypertension: Secondary | ICD-10-CM | POA: Diagnosis not present

## 2017-04-27 DIAGNOSIS — R531 Weakness: Secondary | ICD-10-CM | POA: Diagnosis not present

## 2017-04-27 DIAGNOSIS — D649 Anemia, unspecified: Secondary | ICD-10-CM

## 2017-04-27 DIAGNOSIS — Z95810 Presence of automatic (implantable) cardiac defibrillator: Secondary | ICD-10-CM | POA: Diagnosis not present

## 2017-04-27 DIAGNOSIS — R278 Other lack of coordination: Secondary | ICD-10-CM | POA: Diagnosis not present

## 2017-04-27 DIAGNOSIS — I5021 Acute systolic (congestive) heart failure: Secondary | ICD-10-CM | POA: Diagnosis not present

## 2017-04-27 DIAGNOSIS — Z4789 Encounter for other orthopedic aftercare: Secondary | ICD-10-CM | POA: Diagnosis not present

## 2017-04-27 DIAGNOSIS — E871 Hypo-osmolality and hyponatremia: Secondary | ICD-10-CM | POA: Diagnosis not present

## 2017-04-27 DIAGNOSIS — R2681 Unsteadiness on feet: Secondary | ICD-10-CM | POA: Diagnosis not present

## 2017-04-27 DIAGNOSIS — M6281 Muscle weakness (generalized): Secondary | ICD-10-CM | POA: Diagnosis not present

## 2017-04-27 NOTE — Progress Notes (Signed)
Provider:  Oneal Grout MD  Location:  [facility] Room Number: 806 Place of Service:  ALF ([phone])  PCP: Oneal Grout, MD Patient Care Team: Oneal Grout, MD as PCP - General (Internal Medicine)  Extended Emergency Contact Information Primary Emergency Contact: Morgan,Tom Address: [address]          [place], [place] [address] [place] of [place] Home Phone: [phone] Relation: Friend Secondary Emergency Contact: Brandy Hale [place] Home Phone: [phone] Relation: Niece  Code Status: Full Code Goals of Care: Advanced Directive information Advanced Directives 04/27/2017  Does Patient Have a Medical Advance Directive? No      Chief Complaint  Patient presents with  . Acute Visit    Abnormal labs    HPI: Patient is a 83 y.o. female seen today for abnormal labs. She had blood work done post fall to rule out metabolic abnormality and infection. Labs have resulted and shows low sodium level, elevated creatinine and low hemoglobin. She complaints of pain to left collar bone and shoulder area with movement. She denies any headache or dizziness. No further fall reported. No confusion reported. Her energy level is good.   Past Medical History:  Diagnosis Date  . Anticoagulant long-term use   . Atrial fibrillation (HCC)   . CHF (congestive heart failure) (HCC)   . Chronic anticoagulation   . Hypertension   . Raynaud phenomenon   . Tremor, essential    Past Surgical History:  Procedure Laterality Date  . CHOLECYSTECTOMY    . TONSILLECTOMY      reports that she has never smoked. She has never used smokeless tobacco. She reports that she does not drink alcohol or use drugs. Social History   Social History  . Marital status: Widowed    Spouse name: N/A  . Number of children: 0  . Years of education: 54   Occupational History  . RetiredNurse, mental health education    Social History Main Topics  . Smoking  status: Never Smoker  . Smokeless tobacco: Never Used  . Alcohol use No  . Drug use: No  . Sexual activity: Not on file   Other Topics Concern  . Not on file   Social History Narrative   Tobacco use, amount per day now: No      Past tobacco use, amount per day:      How many years did you use tobacco:      Alcohol use (drinks per week): occasional wine      Diet:      Do you drink/eat things with caffeine? Decaf coffee.      Marital status: Widowed            What year were you married?      Do you live in a house, apartment, assisted living, condo, trailer? Assisted living      Is it one or more stories? 1      How many persons live in your home? 1      Do you have any pets in your home? 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       Do you exercise? No            How often?      Do you have a living will?       Do you have a DNR form? No  If not, do you want to discuss one? Yes      Do you have signed POA/HPOA forms? Yes                    Functional Status Survey:    Family History  Problem Relation Age of Onset  . Hypertension Father   . Heart attack Father   . Heart disease Unknown   . Hypertension Unknown   . Stroke Unknown   . Cancer Unknown     Health Maintenance  Topic Date Due  . TETANUS/TDAP  06[birth year]02[birth year]1940  . PNA vac Low Risk Adult (1 of 2 - PCV13) 06/02/1986  . INFLUENZA VACCINE  03/07/2017  . DEXA SCAN  Completed    Allergies  Allergen Reactions  . Fosamax [Alendronate]   . Other Nausea And Vomiting    Green pepper    Outpatient Encounter Prescriptions as of 04/27/2017  Medication Sig  . acetaminophen (TYLENOL) 500 MG tablet Take 1,000 mg by mouth every 8 (eight) hours. Stop date 05/01/17  . calcium carbonate (OSCAL) 1500 (600 Ca) MG TABS tablet Take 600 mg of elemental calcium by mouth 2 (two) times daily with a meal.  . gabapentin (NEURONTIN) 100 MG capsule Take 100 mg by mouth 2 (two) times daily.  Marland Kitchen  lisinopril (PRINIVIL,ZESTRIL) 10 MG tablet TAKE 1 TABLET BY MOUTH  DAILY  . propranolol ER (INDERAL LA) 80 MG 24 hr capsule TAKE 1 CAPSULE BY MOUTH  DAILY  . traMADol (ULTRAM) 50 MG tablet Take 50 mg by mouth every 8 (eight) hours as needed for moderate pain.   Marland Kitchen triamterene-hydrochlorothiazide (MAXZIDE-25) 37.5-25 MG tablet Take 1 tablet by mouth daily.  Marland Kitchen UNABLE TO FIND Med Name: Vicks vapor rub. Apply under nostrils 2 times daily as needed.  . warfarin (COUMADIN) 2.5 MG tablet Take 2.5 mg by mouth daily. On Tuesday, Thursday and Saturday.  . warfarin (COUMADIN) 5 MG tablet Take 5 mg by mouth daily. On Sunday, Monday, Wednesday and Friday.  . [DISCONTINUED] acetaminophen (TYLENOL) 325 MG tablet Take 650 mg by mouth every 4 (four) hours as needed.   No facility-administered encounter medications on file as of 04/27/2017.     Review of Systems  Constitutional: Positive for appetite change. Negative for chills, diaphoresis, fatigue and fever.       Energy level is fair.  HENT: Positive for hearing loss. Negative for congestion, ear pain, mouth sores, nosebleeds, rhinorrhea and trouble swallowing.   Eyes: Negative for pain and visual disturbance.       Wears glasses.   Respiratory: Negative for shortness of breath and wheezing.   Cardiovascular: Negative for chest pain, palpitations and leg swelling.  Gastrointestinal: Negative for abdominal pain, anal bleeding, blood in stool, constipation, diarrhea, nausea and vomiting.       Last bowel movement was this am  Genitourinary: Negative for dysuria, hematuria and pelvic pain.  Musculoskeletal: Positive for arthralgias, back pain and gait problem.  Skin: Negative for rash and wound.  Neurological: Positive for tremors. Negative for dizziness, syncope, light-headedness, numbness and headaches.  Hematological: Bruises/bleeds easily.  Psychiatric/Behavioral: Negative for behavioral problems and confusion.    Vitals:   04/27/17 1056  BP: 120/60    Pulse: 76  Resp: 20  Temp: 97.7 F (36.5 C)  TempSrc: Oral  SpO2: 97%  Weight: 128 lb (58.1 kg)  Height:  (1.676 m)   Body mass index is 20.66 kg/m. Physical Exam  Constitutional: She is oriented to person, place,  and time. She appears well-developed and well-nourished. No distress.  Thin built  HENT:  Head: Normocephalic and atraumatic.  Mouth/Throat: Oropharynx is clear and moist. No oropharyngeal exudate.  Eyes: Pupils are equal, round, and reactive to light. Conjunctivae and EOM are normal. Right eye exhibits no discharge. Left eye exhibits no discharge.  Neck: Normal range of motion. Neck supple.  Cardiovascular:  Murmur heard. Irregular heart rate  Pulmonary/Chest: Effort normal and breath sounds normal. She has no wheezes. She has no rales.  Abdominal: Soft. Bowel sounds are normal. She exhibits no distension. There is no tenderness. There is no guarding.  Musculoskeletal: She exhibits no edema.  Limited movement to left shoulder with minimal abduction causing pain, good ROM to left elbow and wrist. Able to move other extremities. Has rolling walker. Sling in place.  Lymphadenopathy:    She has no cervical adenopathy.  Neurological: She is alert and oriented to person, place, and time.  Skin: Skin is warm and dry. She is not diaphoretic.  Bruise to left shoulder and anterior chest wall area  Psychiatric: She has a normal mood and affect. Her behavior is normal.    Labs reviewed: Basic Metabolic Panel:  Recent Labs  40/98/11 1304 06/27/16 1130 07/04/16 1131 01/26/17 1317 04/26/17  NA 136 133* 133* 136 126*  K 4.4 4.1 3.9 4.0 4.3  CL 96* 93* 91* 100*  --   CO2 30 33*  --  26  --   GLUCOSE 85 67 103* 104*  --   BUN 19 25 22* 15 28*  CREATININE 0.95* 1.12* 1.00 1.01* 1.2*  CALCIUM 9.9 9.6  --  9.4  --    Liver Function Tests:  Recent Labs  04/26/17  AST 15  ALT 8  ALKPHOS 34   No results for input(s): LIPASE, AMYLASE in the last 8760 hours. No  results for input(s): AMMONIA in the last 8760 hours. CBC:  Recent Labs  07/04/16 1131 01/26/17 1317 04/26/17  WBC  --  7.5 9.1  NEUTROABS  --  4.8  --   HGB 15.0 13.0 10.6*  HCT 44.0 39.3 31*  MCV  --  90.1  --   PLT  --  218 209   Cardiac Enzymes: No results for input(s): CKTOTAL, CKMB, CKMBINDEX, TROPONINI in the last 8760 hours. BNP: Invalid input(s): POCBNP No results found for: HGBA1C Lab Results  Component Value Date   TSH 0.64 10/17/2012   No results found for: VITAMINB12 No results found for: FOLATE No results found for: IRON, TIBC, FERRITIN  Imaging and Procedures obtained prior to SNF admission: Dg Chest 2 View  Result Date: 01/26/2017 CLINICAL DATA:  Bilateral lower extremity swelling. EXAM: CHEST  2 VIEW COMPARISON:  None. FINDINGS: Small to moderate pleural effusions are seen, greater on the right. Right basilar airspace disease is also identified. No pneumothorax. There is cardiomegaly and mild vascular congestion. Atherosclerosis is noted. No acute bony abnormality. The patient is status post vertebral augmentation for a remote L2 fracture. IMPRESSION: Small to moderate bilateral pleural effusions and basilar airspace disease, worse on the right. Cardiomegaly and vascular congestion. Atherosclerosis. Electronically Signed   By: Drusilla Kanner M.D.   On: 01/26/2017 14:41   Left Shoulder 2 Views 04/23/17 Impression. Fracture of the clavicle- minimally displaced fracture of distal clavicle. No evidence of dislocation.    Assessment/Plan  Hyponatremia Encouraged po intake and hydration. Her hctz could be contributing, changes as below. aao x 3 at present and has no complaints. Monitor  bmp  Anemia Likely has anemia of chronic disease. With her recent fall and being on warfarin, monitor h&h. Denies any bleed and energy level is fair. Monitor  Impaired renal function Likely has ckd with her age and history of hypertension. Her hctz could be contributing some  too. BP appears stable. Changes to antihypertensive as below. Maintain hydration. Monitor bmp  HTN Reviewed recent BP- 120/60, 108/60, 110/62, 130/60, 124/68, 122/60, 118/62, 120/58. Currently on triamterene-hctz 37.5-25 mg daily with lisinopril 10 mg daily. D/c triamterene-hctz.    Family/ staff Communication: reviewed care plan with patient and charge nurse.    Labs/tests ordered: cbc, bmp in 1 week   Oneal Grout, MD Internal Medicine [facility] [address] [place], [place] [address] Cell Phone (Monday-Friday 8 am - 5 pm): [phone] On Call: [phone] and follow prompts after 5 pm and on weekends Office Phone: [phone] Office Fax: [phone]

## 2017-04-30 ENCOUNTER — Encounter: Payer: Self-pay | Admitting: Family

## 2017-04-30 ENCOUNTER — Non-Acute Institutional Stay: Payer: Medicare Other | Admitting: Family

## 2017-04-30 DIAGNOSIS — M25551 Pain in right hip: Secondary | ICD-10-CM

## 2017-04-30 DIAGNOSIS — M6281 Muscle weakness (generalized): Secondary | ICD-10-CM | POA: Diagnosis not present

## 2017-04-30 DIAGNOSIS — M79651 Pain in right thigh: Secondary | ICD-10-CM | POA: Diagnosis not present

## 2017-04-30 DIAGNOSIS — R531 Weakness: Secondary | ICD-10-CM | POA: Diagnosis not present

## 2017-04-30 DIAGNOSIS — Z4789 Encounter for other orthopedic aftercare: Secondary | ICD-10-CM | POA: Diagnosis not present

## 2017-04-30 DIAGNOSIS — M25559 Pain in unspecified hip: Secondary | ICD-10-CM | POA: Diagnosis not present

## 2017-04-30 DIAGNOSIS — Z9181 History of falling: Secondary | ICD-10-CM | POA: Diagnosis not present

## 2017-04-30 DIAGNOSIS — M25512 Pain in left shoulder: Secondary | ICD-10-CM

## 2017-04-30 DIAGNOSIS — R278 Other lack of coordination: Secondary | ICD-10-CM | POA: Diagnosis not present

## 2017-04-30 DIAGNOSIS — R2681 Unsteadiness on feet: Secondary | ICD-10-CM | POA: Diagnosis not present

## 2017-04-30 NOTE — Progress Notes (Signed)
Location:  [facility] Room Number: 806 Place of Service:  ALF [phone]) Provider: Kaius Daino FNP-C  Oneal Grout, MD  Patient Care Team: Oneal Grout, MD as PCP - General (Internal Medicine)  Extended Emergency Contact Information Primary Emergency Contact: Morgan,Tom Address: [address]          [place], [place] [address] [place] of [place] Home Phone: [phone] Relation: Friend Secondary Emergency Contact: Fletcher,Christa  [place] Home Phone: [phone] Relation: Niece  Code Status:  Full Code  Goals of care: Advanced Directive information Advanced Directives 04/30/2017  Does Patient Have a Medical Advance Directive? No  Would patient like information on creating a medical advance directive? No - Patient declined     Chief Complaint  Patient presents with  . Acute Visit    Pain in right hip and groin from fall    HPI:  Pt is a 83 y.o. female seen today at [facility] for an acute visit for evaluation of right hip and groin area pain. She is seen in her room today sitting on her recliner.Facility Nurse reports patient having pain to her right hip radiating to groin area. Also states right hip bruised to the buttock area. Patient states left shoulder and right hip pain worst with movement. Of note she is status post fall episode 04/23/2017 sustained a minimally displaced fracture of distal clavicle.she wears left arm sling and works with therapy. No other recent fall episode reported since prior fall episode.  Past Medical History:  Diagnosis Date  . Anticoagulant long-term use   . Atrial fibrillation (HCC)   . CHF (congestive heart failure) (HCC)   . Chronic anticoagulation   . Hypertension   . Raynaud phenomenon   . Tremor, essential    Past Surgical History:  Procedure Laterality Date  . CHOLECYSTECTOMY    . TONSILLECTOMY      Allergies  Allergen Reactions  . Fosamax [Alendronate]   .  Other Nausea And Vomiting    Green pepper    Outpatient Encounter Prescriptions as of 04/30/2017  Medication Sig  . acetaminophen (TYLENOL) 500 MG tablet Take 1,000 mg by mouth every 8 (eight) hours. Stop date 05/01/17  . calcium carbonate (OSCAL) 1500 (600 Ca) MG TABS tablet Take 600 mg of elemental calcium by mouth 2 (two) times daily with a meal.  . gabapentin (NEURONTIN) 100 MG capsule Take 100 mg by mouth 2 (two) times daily.  Marland Kitchen lisinopril (PRINIVIL,ZESTRIL) 10 MG tablet TAKE 1 TABLET BY MOUTH  DAILY  . propranolol ER (INDERAL LA) 80 MG 24 hr capsule TAKE 1 CAPSULE BY MOUTH  DAILY  . traMADol (ULTRAM) 50 MG tablet Take 50 mg by mouth every 8 (eight) hours as needed for moderate pain.   Marland Kitchen UNABLE TO FIND Med Name: Vicks vapor rub. Apply under nostrils 2 times daily as needed.  . [DISCONTINUED] triamterene-hydrochlorothiazide (MAXZIDE-25) 37.5-25 MG tablet Take 1 tablet by mouth daily.  . [DISCONTINUED] warfarin (COUMADIN) 2.5 MG tablet Take 2.5 mg by mouth daily. On Tuesday, Thursday and Saturday.  . [DISCONTINUED] warfarin (COUMADIN) 5 MG tablet Take 5 mg by mouth daily. On Sunday, Monday, Wednesday and Friday.   No facility-administered encounter medications on file as of 04/30/2017.     Review of Systems  Constitutional: Negative for activity change, appetite change, chills, fatigue and fever.  Respiratory: Negative for cough, chest tightness, shortness of breath and wheezing.   Cardiovascular: Negative for chest pain, palpitations and leg  swelling.  Gastrointestinal: Negative for abdominal distention, abdominal pain, constipation, diarrhea, nausea and vomiting.  Genitourinary: Negative for dysuria, flank pain, frequency and urgency.  Musculoskeletal: Positive for gait problem.       Left shoulder and right hip/groin pain.   Skin: Negative for color change, pallor, rash and wound.  Neurological: Negative for dizziness, seizures, syncope, light-headedness and headaches.    Psychiatric/Behavioral: Negative for agitation, confusion, hallucinations and sleep disturbance.     There is no immunization history on file for this patient. Pertinent  Health Maintenance Due  Topic Date Due  . PNA vac Low Risk Adult (1 of 2 - PCV13) 06/02/1986  . INFLUENZA VACCINE  03/07/2017  . DEXA SCAN  Completed   No flowsheet data found.  Vitals:   04/30/17 1203  BP: 140/82  Pulse: 76  Resp: 20  Temp: 97.7 F (36.5 C)  SpO2: 97%  Weight: 128 lb (58.1 kg)  Height:  (1.676 m)   Body mass index is 20.66 kg/m. Physical Exam  Constitutional: She is oriented to person, place, and time. She appears well-developed and well-nourished.  Elderly in no acute distress  HENT:  Head: Normocephalic.  Right Ear: External ear normal.  Left Ear: External ear normal.  Mouth/Throat: Oropharynx is clear and moist. No oropharyngeal exudate.  Eyes: Pupils are equal, round, and reactive to light. Conjunctivae and EOM are normal. Right eye exhibits no discharge. Left eye exhibits no discharge. No scleral icterus.  Neck: Normal range of motion. No JVD present. No thyromegaly present.  Cardiovascular: Exam reveals no gallop and no friction rub.   Murmur heard. HR irregular   Pulmonary/Chest: Effort normal and breath sounds normal. No respiratory distress. She has no wheezes. She has no rales. She exhibits no tenderness.  Abdominal: Soft. Bowel sounds are normal. She exhibits no distension and no mass. There is no tenderness. There is no rebound and no guarding.  Musculoskeletal: She exhibits no edema or deformity.  Moves extremities except left shoulder pain sling in place. Right hip/groin pain with palpation and abduction. Negative leg raise.   Lymphadenopathy:    She has no cervical adenopathy.  Neurological: She is oriented to person, place, and time. Coordination normal.  Skin: Skin is warm and dry. No rash noted. No erythema. No pallor.  Right hip/gluteal large purple bruise.    Psychiatric: She has a normal mood and affect.    Labs reviewed:  Recent Labs  05/30/16 1304 06/27/16 1130 07/04/16 1131 01/26/17 1317 04/26/17  NA 136 133* 133* 136 126*  K 4.4 4.1 3.9 4.0 4.3  CL 96* 93* 91* 100*  --   CO2 30 33*  --  26  --   GLUCOSE 85 67 103* 104*  --   BUN 19 25 22* 15 28*  CREATININE 0.95* 1.12* 1.00 1.01* 1.2*  CALCIUM 9.9 9.6  --  9.4  --     Recent Labs  04/26/17  AST 15  ALT 8  ALKPHOS 34    Recent Labs  07/04/16 1131 01/26/17 1317 04/26/17  WBC  --  7.5 9.1  NEUTROABS  --  4.8  --   HGB 15.0 13.0 10.6*  HCT 44.0 39.3 31*  MCV  --  90.1  --   PLT  --  218 209   Lab Results  Component Value Date   TSH 0.64 10/17/2012    Significant Diagnostic Results in last 30 days:  No results found.  Assessment/Plan 1. Acute right hip pain Status  post fall 04/23/2017. Large purple bruise to right hip/gluteal area tender to palpation.Obtain portable X-ray of  Femur and Pelvis rule out fracture. Continue on Tylenol 1000 mg tablet every 8 hours and Tramadol 50 mg Tablet as needed for pain. Continue to monitor.   2. Acute pain of left shoulder Pain worst with movement. Arm sling in place.continue to work with therapy. Continue above pain regimen.continue to assist with ADL's.    Family/ staff Communication: Reviewed plan of care with patient and facility Nurse supervisor  Labs/tests ordered: portable X-ray of  Femur and Pelvis rule out fracture.  Caesar Bookman, NP

## 2017-05-01 DIAGNOSIS — Z4789 Encounter for other orthopedic aftercare: Secondary | ICD-10-CM | POA: Diagnosis not present

## 2017-05-01 DIAGNOSIS — M6281 Muscle weakness (generalized): Secondary | ICD-10-CM | POA: Diagnosis not present

## 2017-05-01 DIAGNOSIS — R531 Weakness: Secondary | ICD-10-CM | POA: Diagnosis not present

## 2017-05-01 DIAGNOSIS — R2681 Unsteadiness on feet: Secondary | ICD-10-CM | POA: Diagnosis not present

## 2017-05-01 DIAGNOSIS — Z9181 History of falling: Secondary | ICD-10-CM | POA: Diagnosis not present

## 2017-05-01 DIAGNOSIS — R278 Other lack of coordination: Secondary | ICD-10-CM | POA: Diagnosis not present

## 2017-05-02 DIAGNOSIS — Z9181 History of falling: Secondary | ICD-10-CM | POA: Diagnosis not present

## 2017-05-02 DIAGNOSIS — M6281 Muscle weakness (generalized): Secondary | ICD-10-CM | POA: Diagnosis not present

## 2017-05-02 DIAGNOSIS — R531 Weakness: Secondary | ICD-10-CM | POA: Diagnosis not present

## 2017-05-02 DIAGNOSIS — R278 Other lack of coordination: Secondary | ICD-10-CM | POA: Diagnosis not present

## 2017-05-02 DIAGNOSIS — R2681 Unsteadiness on feet: Secondary | ICD-10-CM | POA: Diagnosis not present

## 2017-05-02 DIAGNOSIS — Z4789 Encounter for other orthopedic aftercare: Secondary | ICD-10-CM | POA: Diagnosis not present

## 2017-05-03 DIAGNOSIS — D649 Anemia, unspecified: Secondary | ICD-10-CM | POA: Diagnosis not present

## 2017-05-03 DIAGNOSIS — I10 Essential (primary) hypertension: Secondary | ICD-10-CM | POA: Diagnosis not present

## 2017-05-03 DIAGNOSIS — M6281 Muscle weakness (generalized): Secondary | ICD-10-CM | POA: Diagnosis not present

## 2017-05-03 DIAGNOSIS — R531 Weakness: Secondary | ICD-10-CM | POA: Diagnosis not present

## 2017-05-03 DIAGNOSIS — E871 Hypo-osmolality and hyponatremia: Secondary | ICD-10-CM | POA: Diagnosis not present

## 2017-05-03 DIAGNOSIS — Z4789 Encounter for other orthopedic aftercare: Secondary | ICD-10-CM | POA: Diagnosis not present

## 2017-05-03 DIAGNOSIS — Z9181 History of falling: Secondary | ICD-10-CM | POA: Diagnosis not present

## 2017-05-03 DIAGNOSIS — R2681 Unsteadiness on feet: Secondary | ICD-10-CM | POA: Diagnosis not present

## 2017-05-03 DIAGNOSIS — R278 Other lack of coordination: Secondary | ICD-10-CM | POA: Diagnosis not present

## 2017-05-03 LAB — BASIC METABOLIC PANEL
BUN: 24 — AB (ref 4–21)
CREATININE: 0.9 (ref 0.5–1.1)
Glucose: 80
Potassium: 4.2 (ref 3.4–5.3)
Sodium: 133 — AB (ref 137–147)

## 2017-05-03 LAB — CBC AND DIFFERENTIAL
HCT: 32 — AB (ref 36–46)
HEMOGLOBIN: 11.2 — AB (ref 12.0–16.0)
PLATELETS: 254 (ref 150–399)
WBC: 8

## 2017-05-04 ENCOUNTER — Ambulatory Visit (INDEPENDENT_AMBULATORY_CARE_PROVIDER_SITE_OTHER): Payer: Medicare Other | Admitting: Cardiovascular Disease

## 2017-05-04 ENCOUNTER — Encounter: Payer: Self-pay | Admitting: Cardiovascular Disease

## 2017-05-04 VITALS — BP 100/60 | HR 97 | Resp 16 | Ht 66.0 in | Wt 133.8 lb

## 2017-05-04 DIAGNOSIS — I482 Chronic atrial fibrillation, unspecified: Secondary | ICD-10-CM

## 2017-05-04 NOTE — Progress Notes (Signed)
Cardiology Office Note   Date:  05/04/2017   ID:  Angelica Mejia, DOB 17-Jun-1919, MRN [id number]  PCP:  Oneal Grout, MD  Cardiologist:   Kristeen Miss, MD   Chief Complaint  Patient presents with  . Atrial Fibrillation   1. Atrial fibrillation 2. Essential tremor 3. Chronic anticoagulation 4. Hypertension 5. Raynaud's phenomenon   Pt is doing well. No complaints. She lives at [facility] She has noticed that her HR is very low. She has not had as much energy. She denies any syncope.   October 17, 2012:  She is doing well. She had an episode of more severe palpitations. The episode lasted for 3 hours and resolved overnight.   Sept. 12., 2014:  Angelica Mejia is doing well. She is able to do her normal activities. She goes to see her husband at [facility]. She is at [facility].  Asymptomatic from an A fib standpoint.   October 22, 2013:  Angelica Mejia is doing ok. She is having to take care of her husband who has dementia. No CP , no dyspnea,  No syncope.   Sept. 17, 2015:  Angelica Mejia is doing ok. No Cp or dyspnea.  Has a problem with her renal function and her medical doctor has decreased her Lisinopril to 5 mg ( despite the list saying 20 mg below)     Angelica Mejia is a 83 y.o. female who presents for follow up of her atrial fib and HTN.  She is doing ok Commented that she has occasional small amount of blood in her stool.  Has not had a CBC drawn here in 2 years.   Oct. 4, 2016:   Angelica Mejia is seen back today for follow up of her atrial fib and HTN. Has mild bruising . No CP or dyspnea.  Has plantar fasciatis.  November 08, 2015: Angelica Mejia is seen back today for follow up visit . Has just gotten over a virus .   Has had more shortness of breath and has had palpitations . Seems to be getting better.   Still has fatigue .   Oct. 24, 2017: Seen today as a work in visit.  Has chronic AF . Has some dyspnea Is sleepy.   Is sleeping well at  night  Her tremor has been worse.   Dr. Eloise Harman stopped the maxzide last week.    06/27/2016: Angelica Mejia is seen today for follow-up of her blood pressure. We started one half of her Maxide tablet several weeks ago. She seems to be feeling better. She still does not have much energy. She denies any syncope or presyncope. She denies any light headedness.  Feb. 26, 2018:  Doing well from a cardiac standpoint Had a vertebral fx and had kyphoplasty last week  - her back feels better.  Her Propranolol dose was decreased from 120 CD to 80 mg CD .   She can tell any difference between the 2 doses.   Sept. 28 , 2018:   Doing well Angelica Mejia 2 weeks ago and broke her shoulder ,  Feels ok now  Not as strong as she used to be  No CP , breathing is ok    Past Medical History:  Diagnosis Date  . Anticoagulant long-term use   . Atrial fibrillation (HCC)   . CHF (congestive heart failure) (HCC)   . Chronic anticoagulation   . Hypertension   . Raynaud phenomenon   . Tremor, essential     Past Surgical History:  Procedure  Laterality Date  . CHOLECYSTECTOMY    . TONSILLECTOMY       Current Outpatient Prescriptions  Medication Sig Dispense Refill  . calcium carbonate (OSCAL) 1500 (600 Ca) MG TABS tablet Take 600 mg of elemental calcium by mouth 2 (two) times daily with a meal.    . gabapentin (NEURONTIN) 100 MG capsule Take 100 mg by mouth 2 (two) times daily.    . propranolol ER (INDERAL LA) 80 MG 24 hr capsule TAKE 1 CAPSULE BY MOUTH  DAILY 90 capsule 1  . traMADol (ULTRAM) 50 MG tablet Take 50 mg by mouth every 8 (eight) hours as needed for moderate pain.     Marland Kitchen UNABLE TO FIND Med Name: Vicks vapor rub. Apply under nostrils 2 times daily as needed.    . warfarin (COUMADIN) 2.5 MG tablet Take 2.5 mg by mouth daily. Tuesday, Thursday and Saturday.    . warfarin (COUMADIN) 5 MG tablet Take 5 mg by mouth daily. Takes on Sundays, Mondays, Wednesday, and Friday     No current facility-administered  medications for this visit.     Allergies:   Fosamax [alendronate] and Other    Social History:  The patient  reports that she has never smoked. She has never used smokeless tobacco. She reports that she does not drink alcohol or use drugs.   Family History:  The patient's family history includes Cancer in her unknown relative; Heart attack in her father; Heart disease in her unknown relative; Hypertension in her father and unknown relative; Stroke in her unknown relative.    ROS:  Please see the history of present illness.    Review of Systems: Constitutional:  denies fever, chills, diaphoresis, appetite change and fatigue.  HEENT: denies photophobia, eye pain, redness, hearing loss, ear pain, congestion, sore throat, rhinorrhea, sneezing, neck pain, neck stiffness and tinnitus.  Respiratory: denies SOB, DOE, cough, chest tightness, and wheezing.  Cardiovascular: denies chest pain, palpitations and leg swelling.  Gastrointestinal: denies nausea, vomiting, abdominal pain, diarrhea, constipation, blood in stool.  Genitourinary: denies dysuria, urgency, frequency, hematuria, flank pain and difficulty urinating.  Musculoskeletal: denies  myalgias, back pain, joint swelling, arthralgias and gait problem.   Skin: denies pallor, rash and wound.  Neurological: denies dizziness, seizures, syncope, weakness, light-headedness, numbness and headaches.   Hematological: denies adenopathy, easy bruising, personal or family bleeding history.  Psychiatric/ Behavioral: denies suicidal ideation, mood changes, confusion, nervousness, sleep disturbance and agitation.       All other systems are reviewed and negative.   Physical Exam: Blood pressure 100/60, pulse 97, resp. rate 16, height  (1.676 m), weight 133 lb 12.8 oz (60.7 kg), SpO2 97 %.  GEN:  Well nourished, well developed in no acute distress HEENT: Normal NECK: No JVD; No carotid bruits LYMPHATICS: No lymphadenopathy CARDIAC: Irreg.  Irreg.  no murmurs, rubs, gallops RESPIRATORY:  Clear to auscultation without rales, wheezing or rhonchi  ABDOMEN: Soft, non-tender, non-distended MUSCULOSKELETAL:  No edema; No deformity  SKIN: Warm and dry NEUROLOGIC:  Alert and oriented x 3   EKG:  EKG is not ordered today.  Recent Labs: 01/26/2017: B Natriuretic Peptide 303.2 04/26/2017: ALT 8 05/03/2017: BUN 24; Creatinine 0.9; Hemoglobin 11.2; Platelets 254; Potassium 4.2; Sodium 133   Lipid Panel No results found for: CHOL, TRIG, HDL, CHOLHDL, VLDL, LDLCALC, LDLDIRECT    Wt Readings from Last 3 Encounters:  05/04/17 133 lb 12.8 oz (60.7 kg)  04/30/17 128 lb (58.1 kg)  04/27/17 128 lb (58.1 kg)  ECG:  Other studies Reviewed: Additional studies/ records that were reviewed today include: . Review of the above records demonstrates:    ASSESSMENT AND PLAN:  1. Atrial fibrillation - she appears to be very stable. Heart rate is well-controlled. Continue current meds and coumadin .   2. Essential tremor  3. Chronic anticoagulation - her INR levels have been therapeutic. She commented that she has a small amount of blood in her stool on very rare occasion. We will check a CBC when I check her blood in several weeks.  4. Hypertension -  She is on Lisinopril 10 mg a day  Will DC the lisinopril.  She has [facility] at [facility] a regular basis    5. Raynaud's phenomenon   Current medicines are reviewed at length with the patient today.  The patient does not have concerns regarding medicines.  The following changes have been made:  See above.   Labs/ tests ordered today include:   Will see her in 6 months   Signed, Kristeen Miss, MD  05/04/2017 3:14 PM    [facility] [address] [place], [place], [place]  [address] Phone: [phone]; Fax: [phone]

## 2017-05-04 NOTE — Patient Instructions (Signed)
Medication Instructions:  STOP Lisinopril   Labwork: None Ordered   Testing/Procedures: None Ordered   Follow-Up: Your physician wants you to follow-up in: 6 months with Dr. Elease Hashimoto.  You will receive a reminder letter in the mail two months in advance. If you don't receive a letter, please call our office to schedule the follow-up appointment.   If you need a refill on your cardiac medications before your next appointment, please call your pharmacy.   Thank you for choosing [facility]! Eligha Bridegroom, RN [phone]

## 2017-05-07 DIAGNOSIS — Z961 Presence of intraocular lens: Secondary | ICD-10-CM | POA: Diagnosis not present

## 2017-05-07 DIAGNOSIS — Z9181 History of falling: Secondary | ICD-10-CM | POA: Diagnosis not present

## 2017-05-07 DIAGNOSIS — Z947 Corneal transplant status: Secondary | ICD-10-CM | POA: Diagnosis not present

## 2017-05-07 DIAGNOSIS — M6281 Muscle weakness (generalized): Secondary | ICD-10-CM | POA: Diagnosis not present

## 2017-05-07 DIAGNOSIS — H52203 Unspecified astigmatism, bilateral: Secondary | ICD-10-CM | POA: Diagnosis not present

## 2017-05-07 DIAGNOSIS — R41841 Cognitive communication deficit: Secondary | ICD-10-CM | POA: Diagnosis not present

## 2017-05-07 DIAGNOSIS — N3946 Mixed incontinence: Secondary | ICD-10-CM | POA: Diagnosis not present

## 2017-05-07 DIAGNOSIS — S42002D Fracture of unspecified part of left clavicle, subsequent encounter for fracture with routine healing: Secondary | ICD-10-CM | POA: Diagnosis not present

## 2017-05-07 DIAGNOSIS — R1312 Dysphagia, oropharyngeal phase: Secondary | ICD-10-CM | POA: Diagnosis not present

## 2017-05-07 DIAGNOSIS — M25512 Pain in left shoulder: Secondary | ICD-10-CM | POA: Diagnosis not present

## 2017-05-07 DIAGNOSIS — H10402 Unspecified chronic conjunctivitis, left eye: Secondary | ICD-10-CM | POA: Diagnosis not present

## 2017-05-07 DIAGNOSIS — R278 Other lack of coordination: Secondary | ICD-10-CM | POA: Diagnosis not present

## 2017-05-07 DIAGNOSIS — R2681 Unsteadiness on feet: Secondary | ICD-10-CM | POA: Diagnosis not present

## 2017-05-08 DIAGNOSIS — M6281 Muscle weakness (generalized): Secondary | ICD-10-CM | POA: Diagnosis not present

## 2017-05-08 DIAGNOSIS — Z9181 History of falling: Secondary | ICD-10-CM | POA: Diagnosis not present

## 2017-05-08 DIAGNOSIS — N3946 Mixed incontinence: Secondary | ICD-10-CM | POA: Diagnosis not present

## 2017-05-08 DIAGNOSIS — R2681 Unsteadiness on feet: Secondary | ICD-10-CM | POA: Diagnosis not present

## 2017-05-08 DIAGNOSIS — M25512 Pain in left shoulder: Secondary | ICD-10-CM | POA: Diagnosis not present

## 2017-05-08 DIAGNOSIS — S42002D Fracture of unspecified part of left clavicle, subsequent encounter for fracture with routine healing: Secondary | ICD-10-CM | POA: Diagnosis not present

## 2017-05-09 DIAGNOSIS — N3946 Mixed incontinence: Secondary | ICD-10-CM | POA: Diagnosis not present

## 2017-05-09 DIAGNOSIS — R2681 Unsteadiness on feet: Secondary | ICD-10-CM | POA: Diagnosis not present

## 2017-05-09 DIAGNOSIS — S42002D Fracture of unspecified part of left clavicle, subsequent encounter for fracture with routine healing: Secondary | ICD-10-CM | POA: Diagnosis not present

## 2017-05-09 DIAGNOSIS — M25512 Pain in left shoulder: Secondary | ICD-10-CM | POA: Diagnosis not present

## 2017-05-09 DIAGNOSIS — Z9181 History of falling: Secondary | ICD-10-CM | POA: Diagnosis not present

## 2017-05-09 DIAGNOSIS — M6281 Muscle weakness (generalized): Secondary | ICD-10-CM | POA: Diagnosis not present

## 2017-05-10 DIAGNOSIS — Z9181 History of falling: Secondary | ICD-10-CM | POA: Diagnosis not present

## 2017-05-10 DIAGNOSIS — M6281 Muscle weakness (generalized): Secondary | ICD-10-CM | POA: Diagnosis not present

## 2017-05-10 DIAGNOSIS — N3946 Mixed incontinence: Secondary | ICD-10-CM | POA: Diagnosis not present

## 2017-05-10 DIAGNOSIS — S42002D Fracture of unspecified part of left clavicle, subsequent encounter for fracture with routine healing: Secondary | ICD-10-CM | POA: Diagnosis not present

## 2017-05-10 DIAGNOSIS — R2681 Unsteadiness on feet: Secondary | ICD-10-CM | POA: Diagnosis not present

## 2017-05-10 DIAGNOSIS — M25512 Pain in left shoulder: Secondary | ICD-10-CM | POA: Diagnosis not present

## 2017-05-11 ENCOUNTER — Non-Acute Institutional Stay: Payer: Medicare Other | Admitting: Internal Medicine

## 2017-05-11 ENCOUNTER — Encounter: Payer: Self-pay | Admitting: Internal Medicine

## 2017-05-11 DIAGNOSIS — S42002D Fracture of unspecified part of left clavicle, subsequent encounter for fracture with routine healing: Secondary | ICD-10-CM | POA: Diagnosis not present

## 2017-05-11 DIAGNOSIS — R0609 Other forms of dyspnea: Secondary | ICD-10-CM

## 2017-05-11 DIAGNOSIS — E871 Hypo-osmolality and hyponatremia: Secondary | ICD-10-CM

## 2017-05-11 DIAGNOSIS — N3946 Mixed incontinence: Secondary | ICD-10-CM | POA: Diagnosis not present

## 2017-05-11 DIAGNOSIS — R600 Localized edema: Secondary | ICD-10-CM | POA: Diagnosis not present

## 2017-05-11 DIAGNOSIS — R635 Abnormal weight gain: Secondary | ICD-10-CM | POA: Diagnosis not present

## 2017-05-11 DIAGNOSIS — Z9181 History of falling: Secondary | ICD-10-CM | POA: Diagnosis not present

## 2017-05-11 DIAGNOSIS — M25512 Pain in left shoulder: Secondary | ICD-10-CM | POA: Diagnosis not present

## 2017-05-11 DIAGNOSIS — R2681 Unsteadiness on feet: Secondary | ICD-10-CM | POA: Diagnosis not present

## 2017-05-11 DIAGNOSIS — N179 Acute kidney failure, unspecified: Secondary | ICD-10-CM | POA: Diagnosis not present

## 2017-05-11 DIAGNOSIS — M6281 Muscle weakness (generalized): Secondary | ICD-10-CM | POA: Diagnosis not present

## 2017-05-11 NOTE — Progress Notes (Signed)
Provider:  Oneal Grout MD  Location:  [facility] Room Number: 806 Place of Service:  ALF ([phone])  PCP: Oneal Grout, MD Patient Care Team: Oneal Grout, MD as PCP - General (Internal Medicine)  Extended Emergency Contact Information Primary Emergency Contact: Morgan,Tom Address: [address]          [place], [place] [address] [place] Home Phone: [phone] Relation: Friend Secondary Emergency Contact: Brandy Hale [place] Home Phone: [phone] Relation: Niece  Code Status: Full Code Goals of Care: Advanced Directive information Advanced Directives 04/30/2017  Does Patient Have a Medical Advance Directive? No  Would patient like information on creating a medical advance directive? No - Patient declined      Chief Complaint  Patient presents with  . Acute Visit    Increased leg swelling    HPI: Patient is a 83 y.o. female seen today for dyspnea and increase leg edema. She has noticed dyspnea with exertion x 3 days. She has also noticed her leg to be swollen x 1 week. Of note, on her last visit, with soft BP reading and hyponatremia, her triamterene-hctz was discontinued. Her sodium level has improved. On chart review BP today post exercise was 160/90, but other readings are mostly below 140/90 with one reading of this value.    Past Medical History:  Diagnosis Date  . Anticoagulant long-term use   . Atrial fibrillation (HCC)   . CHF (congestive heart failure) (HCC)   . Chronic anticoagulation   . Hypertension   . Raynaud phenomenon   . Tremor, essential    Past Surgical History:  Procedure Laterality Date  . CHOLECYSTECTOMY    . TONSILLECTOMY      reports that she has never smoked. She has never used smokeless tobacco. She reports that she does not drink alcohol or use drugs. Social History   Social History  . Marital status: Widowed    Spouse name: N/A  . Number of children: 0  .  Years of education: 36   Occupational History  . RetiredNurse, mental health education    Social History Main Topics  . Smoking status: Never Smoker  . Smokeless tobacco: Never Used  . Alcohol use No  . Drug use: No  . Sexual activity: Not on file   Other Topics Concern  . Not on file   Social History Narrative   Tobacco use, amount per day now: No      Past tobacco use, amount per day:      How many years did you use tobacco:      Alcohol use (drinks per week): occasional wine      Diet:      Do you drink/eat things with caffeine? Decaf coffee.      Marital status: Widowed            What year were you married?      Do you live in a house, apartment, assisted living, condo, trailer? Assisted living      Is it one or more stories? 1      How many persons live in your home? 1      Do you have any pets in your home? 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       Do you exercise? No            How often?      Do you have  a living will?       Do you have a DNR form? No           If not, do you want to discuss one? Yes      Do you have signed POA/HPOA forms? Yes                    Functional Status Survey:    Family History  Problem Relation Age of Onset  . Hypertension Father   . Heart attack Father   . Heart disease Unknown   . Hypertension Unknown   . Stroke Unknown   . Cancer Unknown     Health Maintenance  Topic Date Due  . INFLUENZA VACCINE  05/16/2017 (Originally 03/07/2017)  . PNA vac Low Risk Adult (1 of 2 - PCV13) 06/07/2017 (Originally 06/02/1986)  . TETANUS/TDAP  08/07/2018 (Originally 06/02/1940)  . DEXA SCAN  Completed    Allergies  Allergen Reactions  . Fosamax [Alendronate]   . Other Nausea And Vomiting    Green pepper    Outpatient Encounter Prescriptions as of 05/11/2017  Medication Sig  . calcium carbonate (OSCAL) 1500 (600 Ca) MG TABS tablet Take 600 mg of elemental calcium by mouth 2 (two) times daily with a meal.  .  gabapentin (NEURONTIN) 100 MG capsule Take 100 mg by mouth 2 (two) times daily.  . propranolol ER (INDERAL LA) 80 MG 24 hr capsule TAKE 1 CAPSULE BY MOUTH  DAILY  . traMADol (ULTRAM) 50 MG tablet Take 50 mg by mouth every 8 (eight) hours as needed for moderate pain.   Marland Kitchen UNABLE TO FIND Med Name: Vicks vapor rub. Apply under nostrils 2 times daily as needed.  . warfarin (COUMADIN) 2.5 MG tablet Take 2.5 mg by mouth daily. Tuesday, Thursday and Saturday.  . warfarin (COUMADIN) 5 MG tablet Take 5 mg by mouth daily. Takes on Sundays, Mondays, Wednesday, and Friday   No facility-administered encounter medications on file as of 05/11/2017.     Review of Systems  Constitutional: Negative for appetite change, diaphoresis, fatigue and fever.       Energy level is fair.  HENT: Positive for hearing loss. Negative for congestion, ear pain, rhinorrhea and trouble swallowing.   Eyes: Negative for photophobia, pain and visual disturbance.       Wears glasses.   Respiratory: Positive for shortness of breath. Negative for cough, choking and wheezing.   Cardiovascular: Positive for palpitations and leg swelling. Negative for chest pain.  Gastrointestinal: Negative for abdominal pain, blood in stool, constipation, diarrhea, nausea and vomiting.       Last bowel movement was this am  Genitourinary: Negative for dysuria, hematuria and pelvic pain.  Musculoskeletal: Positive for arthralgias, back pain and gait problem.  Skin: Negative for rash and wound.  Neurological: Positive for tremors. Negative for dizziness, syncope, light-headedness, numbness and headaches.  Hematological: Bruises/bleeds easily.  Psychiatric/Behavioral: Negative for behavioral problems and confusion.    Vitals:   05/11/17 1445  BP: 140/90  Pulse: 88  Resp: 20  Temp: 98 F (36.7 C)  TempSrc: Oral  SpO2: 93%  Weight: 132 lb 6.4 oz (60.1 kg)  Height:  (1.676 m)   Body mass index is 21.37 kg/m. Physical Exam    Constitutional: She is oriented to person, place, and time. She appears well-developed and well-nourished. No distress.  Thin built  HENT:  Head: Normocephalic and atraumatic.  Mouth/Throat: Oropharynx is clear and moist. No oropharyngeal exudate.  Eyes: Pupils  are equal, round, and reactive to light. Conjunctivae and EOM are normal. Right eye exhibits no discharge. Left eye exhibits no discharge.  Neck: Normal range of motion. Neck supple.  Cardiovascular: Intact distal pulses.   Murmur heard. Irregular heart rate  Pulmonary/Chest: Effort normal and breath sounds normal. She has no wheezes. She has no rales.  Abdominal: Soft. Bowel sounds are normal. She exhibits no distension. There is no tenderness. There is no guarding.  Musculoskeletal: She exhibits edema.  Able to move all 4 extremities. Trace leg and 1+ foot edema.   Lymphadenopathy:    She has no cervical adenopathy.  Neurological: She is alert and oriented to person, place, and time.  Skin: Skin is warm and dry. She is not diaphoretic.  Psychiatric: She has a normal mood and affect. Her behavior is normal.    Labs reviewed: Basic Metabolic Panel:  Recent Labs  16/10/96 1304 06/27/16 1130  07/04/16 1131 01/26/17 1317 04/26/17 05/03/17  NA 136 133*  --  133* 136 126* 133*  K 4.4 4.1  --  3.9 4.0 4.3 4.2  CL 96* 93*  --  91* 100*  --   --   CO2 30 33*  --   --  26  --   --   GLUCOSE 85 67  --  103* 104*  --   --   BUN 19 25  --  22* 15 28* 24*  CREATININE 0.95* 1.12*  < > 1.00 1.01* 1.2* 0.9  CALCIUM 9.9 9.6  --   --  9.4  --   --   < > = values in this interval not displayed. Liver Function Tests:  Recent Labs  04/26/17  AST 15  ALT 8  ALKPHOS 34   No results for input(s): LIPASE, AMYLASE in the last 8760 hours. No results for input(s): AMMONIA in the last 8760 hours. CBC:  Recent Labs  01/26/17 1317 04/26/17 05/03/17  WBC 7.5 9.1 8.0  NEUTROABS 4.8  --   --   HGB 13.0 10.6* 11.2*  HCT 39.3 31* 32*   MCV 90.1  --   --   PLT 218 209 254   Cardiac Enzymes: No results for input(s): CKTOTAL, CKMB, CKMBINDEX, TROPONINI in the last 8760 hours. BNP: Invalid input(s): POCBNP No results found for: HGBA1C Lab Results  Component Value Date   TSH 0.64 10/17/2012   No results found for: VITAMINB12 No results found for: FOLATE No results found for: IRON, TIBC, FERRITIN  Imaging and Procedures obtained prior to SNF admission: Dg Chest 2 View  Result Date: 01/26/2017 CLINICAL DATA:  Bilateral lower extremity swelling. EXAM: CHEST  2 VIEW COMPARISON:  None. FINDINGS: Small to moderate pleural effusions are seen, greater on the right. Right basilar airspace disease is also identified. No pneumothorax. There is cardiomegaly and mild vascular congestion. Atherosclerosis is noted. No acute bony abnormality. The patient is status post vertebral augmentation for a remote L2 fracture. IMPRESSION: Small to moderate bilateral pleural effusions and basilar airspace disease, worse on the right. Cardiomegaly and vascular congestion. Atherosclerosis. Electronically Signed   By: Drusilla Kanner M.D.   On: 01/26/2017 14:41   Left Shoulder 2 Views 04/23/17 Impression. Fracture of the clavicle- minimally displaced fracture of distal clavicle. No evidence of dislocation.    Assessment/Plan  Leg edema Increased along with 5 lbs weight gain. Place her on chlorthalidone 25 mg daily and check BP daily.   Dyspnea on exertion Her fluid overload could contribute some to her  dyspnea. o2 sat well maintained. Has gained 5 lbs on chart review. Start diuresis as above and monitor weight daily x 1 week and then 3 days a week.   Weight gain Likely from fluid retention. See above. Monitor weight.   Hyponatremia Encouraged po intake and hydration. Off HCTZ. To start chlorthalidone as above and monitor   Family/ staff Communication: reviewed care plan with patient and charge nurse.    Labs/tests ordered: bmp in 1  week  Oneal Grout, MD Internal Medicine [facility] [address] [place], [place] [address] Cell Phone (Monday-Friday 8 am - 5 pm): [phone] On Call: [phone] and follow prompts after 5 pm and on weekends Office Phone: [phone] Office Fax: [phone]

## 2017-05-13 DIAGNOSIS — N179 Acute kidney failure, unspecified: Secondary | ICD-10-CM | POA: Diagnosis not present

## 2017-05-13 LAB — PROTIME-INR: Protime: 49.2 — AB (ref 10.0–13.8)

## 2017-05-13 LAB — POCT INR: INR: 4.7 — AB (ref <=1.1)

## 2017-05-14 ENCOUNTER — Ambulatory Visit
Admission: RE | Admit: 2017-05-14 | Discharge: 2017-05-14 | Disposition: A | Discharge status: Home / Self Care | Payer: Medicare Other | Source: Ambulatory Visit | Attending: Internal Medicine | Admitting: Internal Medicine

## 2017-05-14 DIAGNOSIS — M25512 Pain in left shoulder: Secondary | ICD-10-CM | POA: Diagnosis not present

## 2017-05-14 DIAGNOSIS — Z1231 Encounter for screening mammogram for malignant neoplasm of breast: Secondary | ICD-10-CM

## 2017-05-14 DIAGNOSIS — S42002D Fracture of unspecified part of left clavicle, subsequent encounter for fracture with routine healing: Secondary | ICD-10-CM | POA: Diagnosis not present

## 2017-05-14 DIAGNOSIS — M6281 Muscle weakness (generalized): Secondary | ICD-10-CM | POA: Diagnosis not present

## 2017-05-14 DIAGNOSIS — Z9181 History of falling: Secondary | ICD-10-CM | POA: Diagnosis not present

## 2017-05-14 DIAGNOSIS — N3946 Mixed incontinence: Secondary | ICD-10-CM | POA: Diagnosis not present

## 2017-05-14 DIAGNOSIS — R2681 Unsteadiness on feet: Secondary | ICD-10-CM | POA: Diagnosis not present

## 2017-05-15 ENCOUNTER — Other Ambulatory Visit: Payer: Self-pay | Admitting: *Deleted

## 2017-05-15 DIAGNOSIS — Z9181 History of falling: Secondary | ICD-10-CM | POA: Diagnosis not present

## 2017-05-15 DIAGNOSIS — S42002D Fracture of unspecified part of left clavicle, subsequent encounter for fracture with routine healing: Secondary | ICD-10-CM | POA: Diagnosis not present

## 2017-05-15 DIAGNOSIS — N3946 Mixed incontinence: Secondary | ICD-10-CM | POA: Diagnosis not present

## 2017-05-15 DIAGNOSIS — M25512 Pain in left shoulder: Secondary | ICD-10-CM | POA: Diagnosis not present

## 2017-05-15 DIAGNOSIS — R2681 Unsteadiness on feet: Secondary | ICD-10-CM | POA: Diagnosis not present

## 2017-05-15 DIAGNOSIS — M6281 Muscle weakness (generalized): Secondary | ICD-10-CM | POA: Diagnosis not present

## 2017-05-16 ENCOUNTER — Ambulatory Visit (INDEPENDENT_AMBULATORY_CARE_PROVIDER_SITE_OTHER): Payer: Medicare Other

## 2017-05-16 ENCOUNTER — Ambulatory Visit (INDEPENDENT_AMBULATORY_CARE_PROVIDER_SITE_OTHER): Payer: Medicare Other | Admitting: Orthopedic Surgery

## 2017-05-16 ENCOUNTER — Encounter (INDEPENDENT_AMBULATORY_CARE_PROVIDER_SITE_OTHER): Payer: Self-pay | Admitting: Orthopedic Surgery

## 2017-05-16 DIAGNOSIS — S42002D Fracture of unspecified part of left clavicle, subsequent encounter for fracture with routine healing: Secondary | ICD-10-CM | POA: Diagnosis not present

## 2017-05-16 DIAGNOSIS — N179 Acute kidney failure, unspecified: Secondary | ICD-10-CM | POA: Diagnosis not present

## 2017-05-16 DIAGNOSIS — Z8781 Personal history of (healed) traumatic fracture: Secondary | ICD-10-CM | POA: Insufficient documentation

## 2017-05-17 DIAGNOSIS — M25512 Pain in left shoulder: Secondary | ICD-10-CM | POA: Diagnosis not present

## 2017-05-17 DIAGNOSIS — Z9181 History of falling: Secondary | ICD-10-CM | POA: Diagnosis not present

## 2017-05-17 DIAGNOSIS — S42002D Fracture of unspecified part of left clavicle, subsequent encounter for fracture with routine healing: Secondary | ICD-10-CM | POA: Diagnosis not present

## 2017-05-17 DIAGNOSIS — I10 Essential (primary) hypertension: Secondary | ICD-10-CM | POA: Diagnosis not present

## 2017-05-17 DIAGNOSIS — D649 Anemia, unspecified: Secondary | ICD-10-CM | POA: Diagnosis not present

## 2017-05-17 DIAGNOSIS — R2681 Unsteadiness on feet: Secondary | ICD-10-CM | POA: Diagnosis not present

## 2017-05-17 DIAGNOSIS — N3946 Mixed incontinence: Secondary | ICD-10-CM | POA: Diagnosis not present

## 2017-05-17 DIAGNOSIS — E871 Hypo-osmolality and hyponatremia: Secondary | ICD-10-CM | POA: Diagnosis not present

## 2017-05-17 DIAGNOSIS — M6281 Muscle weakness (generalized): Secondary | ICD-10-CM | POA: Diagnosis not present

## 2017-05-17 LAB — BASIC METABOLIC PANEL
BUN: 16 (ref 4–21)
CREATININE: 1 (ref 0.5–1.1)
GLUCOSE: 87
Potassium: 3.7 (ref 3.4–5.3)
Sodium: 133 — AB (ref 137–147)

## 2017-05-18 DIAGNOSIS — R2681 Unsteadiness on feet: Secondary | ICD-10-CM | POA: Diagnosis not present

## 2017-05-18 DIAGNOSIS — M25512 Pain in left shoulder: Secondary | ICD-10-CM | POA: Diagnosis not present

## 2017-05-18 DIAGNOSIS — Z9181 History of falling: Secondary | ICD-10-CM | POA: Diagnosis not present

## 2017-05-18 DIAGNOSIS — S42002D Fracture of unspecified part of left clavicle, subsequent encounter for fracture with routine healing: Secondary | ICD-10-CM | POA: Diagnosis not present

## 2017-05-18 DIAGNOSIS — M6281 Muscle weakness (generalized): Secondary | ICD-10-CM | POA: Diagnosis not present

## 2017-05-18 DIAGNOSIS — N3946 Mixed incontinence: Secondary | ICD-10-CM | POA: Diagnosis not present

## 2017-05-21 DIAGNOSIS — D649 Anemia, unspecified: Secondary | ICD-10-CM | POA: Diagnosis not present

## 2017-05-21 DIAGNOSIS — Z7901 Long term (current) use of anticoagulants: Secondary | ICD-10-CM | POA: Diagnosis not present

## 2017-05-21 DIAGNOSIS — I4891 Unspecified atrial fibrillation: Secondary | ICD-10-CM | POA: Diagnosis not present

## 2017-05-21 DIAGNOSIS — R062 Wheezing: Secondary | ICD-10-CM | POA: Diagnosis not present

## 2017-05-21 NOTE — Progress Notes (Signed)
   Post-Op Visit Note   Patient: Angelica Mejia           Date of Birth: 1919-06-05           MRN: [id number] Visit Date: 05/16/2017 PCP: Oneal Grout, MD   Assessment & Plan:  Chief Complaint:  Chief Complaint  Patient presents with  . Left Shoulder - Follow-up, Fracture   Visit Diagnoses:  1. Closed displaced fracture of left clavicle with routine healing, unspecified part of clavicle, subsequent encounter     Plan: pain is a patient with left clavicle distal fracture.  She's doing well.  On exam she has full active and passive range of motion.  Radiographs show no change in fracture alignment.  Fairly minimal tenderness in this area.  Laminar release her at this time follow up with me as needed.  She is currently asymptomatic.  Follow-Up Instructions: Return if symptoms worsen or fail to improve.   Orders:  Orders Placed This Encounter  Procedures  . XR Clavicle Left   No orders of the defined types were placed in this encounter.   Imaging: No results found.  PMFS History: Patient Active Problem List   Diagnosis Date Noted  . Clavicle fracture 05/16/2017  . Encounter for therapeutic drug monitoring 04/19/2017  . Tremor, essential   . Hypertension   . Raynaud phenomenon    Past Medical History:  Diagnosis Date  . Anticoagulant long-term use   . Atrial fibrillation (HCC)   . CHF (congestive heart failure) (HCC)   . Chronic anticoagulation   . Hypertension   . Raynaud phenomenon   . Tremor, essential     Family History  Problem Relation Age of Onset  . Hypertension Father   . Heart attack Father   . Heart disease Unknown   . Hypertension Unknown   . Stroke Unknown   . Cancer Unknown     Past Surgical History:  Procedure Laterality Date  . CHOLECYSTECTOMY    . TONSILLECTOMY     Social History   Occupational History  . RetiredNurse, mental health education    Social History Main Topics  . Smoking status: Never Smoker  . Smokeless tobacco: Never Used    . Alcohol use No  . Drug use: No  . Sexual activity: Not on file

## 2017-05-22 DIAGNOSIS — S42002D Fracture of unspecified part of left clavicle, subsequent encounter for fracture with routine healing: Secondary | ICD-10-CM | POA: Diagnosis not present

## 2017-05-22 DIAGNOSIS — M6281 Muscle weakness (generalized): Secondary | ICD-10-CM | POA: Diagnosis not present

## 2017-05-22 DIAGNOSIS — M25512 Pain in left shoulder: Secondary | ICD-10-CM | POA: Diagnosis not present

## 2017-05-22 DIAGNOSIS — N3946 Mixed incontinence: Secondary | ICD-10-CM | POA: Diagnosis not present

## 2017-05-22 DIAGNOSIS — Z9181 History of falling: Secondary | ICD-10-CM | POA: Diagnosis not present

## 2017-05-22 DIAGNOSIS — R2681 Unsteadiness on feet: Secondary | ICD-10-CM | POA: Diagnosis not present

## 2017-05-23 DIAGNOSIS — M25512 Pain in left shoulder: Secondary | ICD-10-CM | POA: Diagnosis not present

## 2017-05-23 DIAGNOSIS — N3946 Mixed incontinence: Secondary | ICD-10-CM | POA: Diagnosis not present

## 2017-05-23 DIAGNOSIS — R2681 Unsteadiness on feet: Secondary | ICD-10-CM | POA: Diagnosis not present

## 2017-05-23 DIAGNOSIS — M6281 Muscle weakness (generalized): Secondary | ICD-10-CM | POA: Diagnosis not present

## 2017-05-23 DIAGNOSIS — Z9181 History of falling: Secondary | ICD-10-CM | POA: Diagnosis not present

## 2017-05-23 DIAGNOSIS — S42002D Fracture of unspecified part of left clavicle, subsequent encounter for fracture with routine healing: Secondary | ICD-10-CM | POA: Diagnosis not present

## 2017-05-24 DIAGNOSIS — Z9181 History of falling: Secondary | ICD-10-CM | POA: Diagnosis not present

## 2017-05-24 DIAGNOSIS — M25512 Pain in left shoulder: Secondary | ICD-10-CM | POA: Diagnosis not present

## 2017-05-24 DIAGNOSIS — R2681 Unsteadiness on feet: Secondary | ICD-10-CM | POA: Diagnosis not present

## 2017-05-24 DIAGNOSIS — M6281 Muscle weakness (generalized): Secondary | ICD-10-CM | POA: Diagnosis not present

## 2017-05-24 DIAGNOSIS — S42002D Fracture of unspecified part of left clavicle, subsequent encounter for fracture with routine healing: Secondary | ICD-10-CM | POA: Diagnosis not present

## 2017-05-24 DIAGNOSIS — N3946 Mixed incontinence: Secondary | ICD-10-CM | POA: Diagnosis not present

## 2017-05-25 DIAGNOSIS — R2681 Unsteadiness on feet: Secondary | ICD-10-CM | POA: Diagnosis not present

## 2017-05-25 DIAGNOSIS — S42002D Fracture of unspecified part of left clavicle, subsequent encounter for fracture with routine healing: Secondary | ICD-10-CM | POA: Diagnosis not present

## 2017-05-25 DIAGNOSIS — N3946 Mixed incontinence: Secondary | ICD-10-CM | POA: Diagnosis not present

## 2017-05-25 DIAGNOSIS — M25512 Pain in left shoulder: Secondary | ICD-10-CM | POA: Diagnosis not present

## 2017-05-25 DIAGNOSIS — Z9181 History of falling: Secondary | ICD-10-CM | POA: Diagnosis not present

## 2017-05-25 DIAGNOSIS — M6281 Muscle weakness (generalized): Secondary | ICD-10-CM | POA: Diagnosis not present

## 2017-05-28 DIAGNOSIS — R062 Wheezing: Secondary | ICD-10-CM | POA: Diagnosis not present

## 2017-05-28 DIAGNOSIS — S42002D Fracture of unspecified part of left clavicle, subsequent encounter for fracture with routine healing: Secondary | ICD-10-CM | POA: Diagnosis not present

## 2017-05-28 DIAGNOSIS — R2681 Unsteadiness on feet: Secondary | ICD-10-CM | POA: Diagnosis not present

## 2017-05-28 DIAGNOSIS — N3946 Mixed incontinence: Secondary | ICD-10-CM | POA: Diagnosis not present

## 2017-05-28 DIAGNOSIS — Z7901 Long term (current) use of anticoagulants: Secondary | ICD-10-CM | POA: Diagnosis not present

## 2017-05-28 DIAGNOSIS — Z9181 History of falling: Secondary | ICD-10-CM | POA: Diagnosis not present

## 2017-05-28 DIAGNOSIS — I4891 Unspecified atrial fibrillation: Secondary | ICD-10-CM | POA: Diagnosis not present

## 2017-05-28 DIAGNOSIS — M6281 Muscle weakness (generalized): Secondary | ICD-10-CM | POA: Diagnosis not present

## 2017-05-28 DIAGNOSIS — M25512 Pain in left shoulder: Secondary | ICD-10-CM | POA: Diagnosis not present

## 2017-05-28 LAB — POCT INR: INR: 3.2 — AB (ref <=1.1)

## 2017-05-28 LAB — PROTIME-INR: PROTIME: 33.3 — AB (ref 10.0–13.8)

## 2017-05-29 ENCOUNTER — Other Ambulatory Visit: Payer: Self-pay | Admitting: *Deleted

## 2017-05-29 ENCOUNTER — Encounter: Payer: Self-pay | Admitting: Nurse Practitioner

## 2017-05-29 ENCOUNTER — Non-Acute Institutional Stay: Payer: Medicare Other | Admitting: Nurse Practitioner

## 2017-05-29 DIAGNOSIS — M25512 Pain in left shoulder: Secondary | ICD-10-CM | POA: Diagnosis not present

## 2017-05-29 DIAGNOSIS — I482 Chronic atrial fibrillation, unspecified: Secondary | ICD-10-CM

## 2017-05-29 DIAGNOSIS — Z7901 Long term (current) use of anticoagulants: Secondary | ICD-10-CM | POA: Diagnosis not present

## 2017-05-29 DIAGNOSIS — R2681 Unsteadiness on feet: Secondary | ICD-10-CM | POA: Diagnosis not present

## 2017-05-29 DIAGNOSIS — M6281 Muscle weakness (generalized): Secondary | ICD-10-CM | POA: Diagnosis not present

## 2017-05-29 DIAGNOSIS — I4891 Unspecified atrial fibrillation: Secondary | ICD-10-CM | POA: Diagnosis not present

## 2017-05-29 DIAGNOSIS — N3946 Mixed incontinence: Secondary | ICD-10-CM | POA: Diagnosis not present

## 2017-05-29 DIAGNOSIS — Z5181 Encounter for therapeutic drug level monitoring: Secondary | ICD-10-CM | POA: Diagnosis not present

## 2017-05-29 DIAGNOSIS — I509 Heart failure, unspecified: Secondary | ICD-10-CM | POA: Diagnosis not present

## 2017-05-29 DIAGNOSIS — Z9181 History of falling: Secondary | ICD-10-CM | POA: Diagnosis not present

## 2017-05-29 DIAGNOSIS — S42002D Fracture of unspecified part of left clavicle, subsequent encounter for fracture with routine healing: Secondary | ICD-10-CM | POA: Diagnosis not present

## 2017-05-29 NOTE — Assessment & Plan Note (Addendum)
Will schedule Oscal 600mg  with breakfast and lunch, change Gabapentin 100mg  bid/qam due to the patient's pm meds refusal.

## 2017-05-29 NOTE — Assessment & Plan Note (Signed)
For Afib, heart rate is in control, INR 3.2 05/28/17, Coumadin 4mg  held, INR 3.1 05/29/17, will decrease Coumadin 3mg  T W Thr, 4mg  Fri, Sat, Sun, Mon. Repeat PT/INR one week.

## 2017-05-29 NOTE — Assessment & Plan Note (Signed)
Heart rate is in control.  

## 2017-05-29 NOTE — Progress Notes (Signed)
Location:  [facility] Room Number: 806 Place of Service:  ALF [phone] Provider:  Zela Sobieski, Manxie  NP  Oneal Grout, MD  Patient Care Team: Oneal Grout, MD as PCP - General (Internal Medicine)  Extended Emergency Contact Information Primary Emergency Contact: AngelicaTom Address: [address]          [place], [place] [address] [place] Home Phone: [phone] Relation: Friend Secondary Emergency Contact: Mejia,Angelica  [place] Home Phone: [phone] Relation: Niece  Code Status:  Full Code Goals of care: Advanced Directive information Advanced Directives 05/29/2017  Does Patient Have a Medical Advance Directive? No  Would patient like information on creating a medical advance directive? No - Patient declined     Chief Complaint  Patient presents with  . Acute Visit    Medication Mangement due to not taking medication at night.    HPI:  Pt is a 83 y.o. female seen today for an acute visit for the patient's refusal of pm medications: they are Gabapentin and Oscal. The patient stated she sleeps well at night and denied pain in legs, arms, or back at night. She has history of Afib, heart rate is in control.  Currently she is supra therapeutic anticoagulation: INR 3.1 today, Coumadin 4mg  held 05/28/17 for INR 3.2, no sign of bleeding.    Past Medical History:  Diagnosis Date  . Anticoagulant long-term use   . Atrial fibrillation (HCC)   . CHF (congestive heart failure) (HCC)   . Chronic anticoagulation   . Hypertension   . Raynaud phenomenon   . Tremor, essential    Past Surgical History:  Procedure Laterality Date  . CHOLECYSTECTOMY    . TONSILLECTOMY      Allergies  Allergen Reactions  . Fosamax [Alendronate]   . Other Nausea And Vomiting    Green pepper    Outpatient Encounter Prescriptions as of 05/29/2017  Medication Sig  . calcium carbonate (OSCAL) 1500 (600 Ca) MG TABS tablet Take 600  mg of elemental calcium by mouth 2 (two) times daily with a meal.  . chlorthalidone (HYGROTON) 25 MG tablet Take 25 mg by mouth daily.   Marland Kitchen gabapentin (NEURONTIN) 100 MG capsule Take 100 mg by mouth 2 (two) times daily.  . propranolol ER (INDERAL LA) 80 MG 24 hr capsule TAKE 1 CAPSULE BY MOUTH  DAILY  . traMADol (ULTRAM) 50 MG tablet Take 50 mg by mouth every 8 (eight) hours as needed for moderate pain.   Marland Kitchen UNABLE TO FIND Med Name: Vicks vapor rub. Apply under nostrils 2 times daily as needed.  . [DISCONTINUED] warfarin (COUMADIN) 2.5 MG tablet Take 2.5 mg by mouth daily. Tuesday, Thursday and Saturday.  . [DISCONTINUED] warfarin (COUMADIN) 5 MG tablet Take 5 mg by mouth daily. Takes on Sundays, Mondays, Wednesday, and Friday   No facility-administered encounter medications on file as of 05/29/2017.     Review of Systems  Constitutional: Negative for activity change, appetite change, diaphoresis, fatigue and fever.  Respiratory: Negative for cough, choking, chest tightness, shortness of breath and wheezing.   Cardiovascular: Negative for chest pain, palpitations and leg swelling.  Gastrointestinal: Negative for abdominal pain, anal bleeding, blood in stool, nausea and vomiting.  Genitourinary: Negative for hematuria.  Musculoskeletal: Positive for gait problem. Negative for arthralgias, back pain and neck pain.  Skin: Negative for color change.     There is no immunization history on file for this patient. Pertinent  Health Maintenance Due  Topic Date Due  . INFLUENZA VACCINE  03/07/2017  . PNA vac Low Risk Adult (1 of 2 - PCV13) 06/07/2017 (Originally [birth year]1986)  . DEXA SCAN  Completed   No flowsheet data found. Functional Status Survey:    Vitals:   05/29/17 1317  BP: 128/68  Pulse: 78  Resp: 18  Temp: 98.3 F (36.8 C)  Weight: 132 lb 6.4 oz (60.1 kg)  Height: 5\' 6"  (1.676 m)   Body mass index is 21.37 kg/m. Physical Exam  Constitutional: She appears well-developed  and well-nourished.  Neck: Normal range of motion. Neck supple.  Cardiovascular: Normal rate.   Murmur heard. Irregular heart beats  Pulmonary/Chest: She has no wheezes. She has no rales.  Abdominal: Soft. Bowel sounds are normal. She exhibits no distension. There is no tenderness.  Musculoskeletal: She exhibits no edema or tenderness.  Neurological: She is alert.  Oriented to person and place.   Skin: Skin is warm and dry. No pallor.    Labs reviewed:  Recent Labs  05/30/16 1304 06/27/16 1130  07/04/16 1131 01/26/17 1317 04/26/17 05/03/17 05/17/17  NA 136 133*  --  133* 136 126* 133* 133*  K 4.4 4.1  --  3.9 4.0 4.3 4.2 3.7  CL 96* 93*  --  91* 100*  --   --   --   CO2 30 33*  --   --  26  --   --   --   GLUCOSE 85 67  --  103* 104*  --   --   --   BUN 19 25  --  22* 15 28* 24* 16  CREATININE 0.95* 1.12*  < > 1.00 1.01* 1.2* 0.9 1.0  CALCIUM 9.9 9.6  --   --  9.4  --   --   --   < > = values in this interval not displayed.  Recent Labs  04/26/17  AST 15  ALT 8  ALKPHOS 34    Recent Labs  01/26/17 1317 04/26/17 05/03/17  WBC 7.5 9.1 8.0  NEUTROABS 4.8  --   --   HGB 13.0 10.6* 11.2*  HCT 39.3 31* 32*  MCV 90.1  --   --   PLT 218 209 254   Lab Results  Component Value Date   TSH 0.64 10/17/2012   No results found for: HGBA1C No results found for: CHOL, HDL, LDLCALC, LDLDIRECT, TRIG, CHOLHDL  Significant Diagnostic Results in last 30 days:  Mm Digital Screening Bilateral  Result Date: 05/15/2017 CLINICAL DATA:  Screening. EXAM: DIGITAL SCREENING BILATERAL MAMMOGRAM WITH CAD COMPARISON:  Previous exam(s). ACR Breast Density Category d: The breast tissue is extremely dense, which lowers the sensitivity of mammography. FINDINGS: There are no findings suspicious for malignancy. Images were processed with CAD. IMPRESSION: No mammographic evidence of malignancy. A result letter of this screening mammogram will be mailed directly to the patient. RECOMMENDATION:  Screening mammogram in one year. (Code:SM-B-01Y) BI-RADS CATEGORY  1: Negative. Electronically Signed   By: Amie Portland M.D.   On: 05/15/2017 10:32   Xr Clavicle Left  Result Date: 05/21/2017 2 views of left clavicle reviewed.  No change in alignment present.  Some small amount of callus formation is noted.  Remainder shoulder normal with no other fractures or dislocation.   Assessment/Plan Encounter for therapeutic drug monitoring Will schedule Oscal 600mg  with breakfast and lunch, change Gabapentin 100mg  bid/qam due to the patient's pm meds refusal.   Chronic anticoagulation For Afib, heart rate is in  control, INR 3.2 05/28/17, Coumadin 4mg  held, INR 3.1 05/29/17, will decrease Coumadin 3mg  T W Thr, 4mg  Fri, Sat, Sun, Mon. Repeat PT/INR one week.   A-fib (HCC) Heart rate is in control.      Family/ staff Communication: plan of care reviewed with the patient and charge nurse.   Labs/tests ordered:  PT/INR one week  Time spend 25 minutes.

## 2017-05-30 DIAGNOSIS — N3946 Mixed incontinence: Secondary | ICD-10-CM | POA: Diagnosis not present

## 2017-05-30 DIAGNOSIS — M25512 Pain in left shoulder: Secondary | ICD-10-CM | POA: Diagnosis not present

## 2017-05-30 DIAGNOSIS — R2681 Unsteadiness on feet: Secondary | ICD-10-CM | POA: Diagnosis not present

## 2017-05-30 DIAGNOSIS — Z9181 History of falling: Secondary | ICD-10-CM | POA: Diagnosis not present

## 2017-05-30 DIAGNOSIS — M6281 Muscle weakness (generalized): Secondary | ICD-10-CM | POA: Diagnosis not present

## 2017-05-30 DIAGNOSIS — S42002D Fracture of unspecified part of left clavicle, subsequent encounter for fracture with routine healing: Secondary | ICD-10-CM | POA: Diagnosis not present

## 2017-05-31 DIAGNOSIS — Z9181 History of falling: Secondary | ICD-10-CM | POA: Diagnosis not present

## 2017-05-31 DIAGNOSIS — M6281 Muscle weakness (generalized): Secondary | ICD-10-CM | POA: Diagnosis not present

## 2017-05-31 DIAGNOSIS — N3946 Mixed incontinence: Secondary | ICD-10-CM | POA: Diagnosis not present

## 2017-05-31 DIAGNOSIS — M25512 Pain in left shoulder: Secondary | ICD-10-CM | POA: Diagnosis not present

## 2017-05-31 DIAGNOSIS — R2681 Unsteadiness on feet: Secondary | ICD-10-CM | POA: Diagnosis not present

## 2017-05-31 DIAGNOSIS — S42002D Fracture of unspecified part of left clavicle, subsequent encounter for fracture with routine healing: Secondary | ICD-10-CM | POA: Diagnosis not present

## 2017-06-01 DIAGNOSIS — Z9181 History of falling: Secondary | ICD-10-CM | POA: Diagnosis not present

## 2017-06-01 DIAGNOSIS — N3946 Mixed incontinence: Secondary | ICD-10-CM | POA: Diagnosis not present

## 2017-06-01 DIAGNOSIS — M6281 Muscle weakness (generalized): Secondary | ICD-10-CM | POA: Diagnosis not present

## 2017-06-01 DIAGNOSIS — R2681 Unsteadiness on feet: Secondary | ICD-10-CM | POA: Diagnosis not present

## 2017-06-01 DIAGNOSIS — M25512 Pain in left shoulder: Secondary | ICD-10-CM | POA: Diagnosis not present

## 2017-06-01 DIAGNOSIS — S42002D Fracture of unspecified part of left clavicle, subsequent encounter for fracture with routine healing: Secondary | ICD-10-CM | POA: Diagnosis not present

## 2017-06-04 DIAGNOSIS — M25512 Pain in left shoulder: Secondary | ICD-10-CM | POA: Diagnosis not present

## 2017-06-04 DIAGNOSIS — Z9181 History of falling: Secondary | ICD-10-CM | POA: Diagnosis not present

## 2017-06-04 DIAGNOSIS — N3946 Mixed incontinence: Secondary | ICD-10-CM | POA: Diagnosis not present

## 2017-06-04 DIAGNOSIS — S42002D Fracture of unspecified part of left clavicle, subsequent encounter for fracture with routine healing: Secondary | ICD-10-CM | POA: Diagnosis not present

## 2017-06-04 DIAGNOSIS — R2681 Unsteadiness on feet: Secondary | ICD-10-CM | POA: Diagnosis not present

## 2017-06-04 DIAGNOSIS — M6281 Muscle weakness (generalized): Secondary | ICD-10-CM | POA: Diagnosis not present

## 2017-06-05 DIAGNOSIS — Z7901 Long term (current) use of anticoagulants: Secondary | ICD-10-CM | POA: Diagnosis not present

## 2017-06-05 DIAGNOSIS — R062 Wheezing: Secondary | ICD-10-CM | POA: Diagnosis not present

## 2017-06-05 DIAGNOSIS — S42002D Fracture of unspecified part of left clavicle, subsequent encounter for fracture with routine healing: Secondary | ICD-10-CM | POA: Diagnosis not present

## 2017-06-05 DIAGNOSIS — M25512 Pain in left shoulder: Secondary | ICD-10-CM | POA: Diagnosis not present

## 2017-06-05 DIAGNOSIS — R2681 Unsteadiness on feet: Secondary | ICD-10-CM | POA: Diagnosis not present

## 2017-06-05 DIAGNOSIS — Z9181 History of falling: Secondary | ICD-10-CM | POA: Diagnosis not present

## 2017-06-05 DIAGNOSIS — N3946 Mixed incontinence: Secondary | ICD-10-CM | POA: Diagnosis not present

## 2017-06-05 DIAGNOSIS — I4891 Unspecified atrial fibrillation: Secondary | ICD-10-CM | POA: Diagnosis not present

## 2017-06-05 DIAGNOSIS — M6281 Muscle weakness (generalized): Secondary | ICD-10-CM | POA: Diagnosis not present

## 2017-06-07 DIAGNOSIS — R278 Other lack of coordination: Secondary | ICD-10-CM | POA: Diagnosis not present

## 2017-06-07 DIAGNOSIS — S42002D Fracture of unspecified part of left clavicle, subsequent encounter for fracture with routine healing: Secondary | ICD-10-CM | POA: Diagnosis not present

## 2017-06-07 DIAGNOSIS — M25512 Pain in left shoulder: Secondary | ICD-10-CM | POA: Diagnosis not present

## 2017-06-07 DIAGNOSIS — N3946 Mixed incontinence: Secondary | ICD-10-CM | POA: Diagnosis not present

## 2017-06-07 DIAGNOSIS — Z9181 History of falling: Secondary | ICD-10-CM | POA: Diagnosis not present

## 2017-06-07 DIAGNOSIS — M6281 Muscle weakness (generalized): Secondary | ICD-10-CM | POA: Diagnosis not present

## 2017-06-08 DIAGNOSIS — N3946 Mixed incontinence: Secondary | ICD-10-CM | POA: Diagnosis not present

## 2017-06-08 DIAGNOSIS — R278 Other lack of coordination: Secondary | ICD-10-CM | POA: Diagnosis not present

## 2017-06-08 DIAGNOSIS — S42002D Fracture of unspecified part of left clavicle, subsequent encounter for fracture with routine healing: Secondary | ICD-10-CM | POA: Diagnosis not present

## 2017-06-08 DIAGNOSIS — M6281 Muscle weakness (generalized): Secondary | ICD-10-CM | POA: Diagnosis not present

## 2017-06-08 DIAGNOSIS — M25512 Pain in left shoulder: Secondary | ICD-10-CM | POA: Diagnosis not present

## 2017-06-08 DIAGNOSIS — Z9181 History of falling: Secondary | ICD-10-CM | POA: Diagnosis not present

## 2017-06-11 DIAGNOSIS — N3946 Mixed incontinence: Secondary | ICD-10-CM | POA: Diagnosis not present

## 2017-06-11 DIAGNOSIS — R278 Other lack of coordination: Secondary | ICD-10-CM | POA: Diagnosis not present

## 2017-06-11 DIAGNOSIS — Z9181 History of falling: Secondary | ICD-10-CM | POA: Diagnosis not present

## 2017-06-11 DIAGNOSIS — M25512 Pain in left shoulder: Secondary | ICD-10-CM | POA: Diagnosis not present

## 2017-06-11 DIAGNOSIS — M6281 Muscle weakness (generalized): Secondary | ICD-10-CM | POA: Diagnosis not present

## 2017-06-11 DIAGNOSIS — S42002D Fracture of unspecified part of left clavicle, subsequent encounter for fracture with routine healing: Secondary | ICD-10-CM | POA: Diagnosis not present

## 2017-06-12 DIAGNOSIS — R062 Wheezing: Secondary | ICD-10-CM | POA: Diagnosis not present

## 2017-06-12 DIAGNOSIS — Z7901 Long term (current) use of anticoagulants: Secondary | ICD-10-CM | POA: Diagnosis not present

## 2017-06-12 DIAGNOSIS — I4891 Unspecified atrial fibrillation: Secondary | ICD-10-CM | POA: Diagnosis not present

## 2017-06-13 ENCOUNTER — Encounter: Payer: Self-pay | Admitting: Nurse Practitioner

## 2017-06-13 DIAGNOSIS — Z9181 History of falling: Secondary | ICD-10-CM | POA: Diagnosis not present

## 2017-06-13 DIAGNOSIS — M25512 Pain in left shoulder: Secondary | ICD-10-CM | POA: Diagnosis not present

## 2017-06-13 DIAGNOSIS — N3946 Mixed incontinence: Secondary | ICD-10-CM | POA: Diagnosis not present

## 2017-06-13 DIAGNOSIS — R278 Other lack of coordination: Secondary | ICD-10-CM | POA: Diagnosis not present

## 2017-06-13 DIAGNOSIS — M6281 Muscle weakness (generalized): Secondary | ICD-10-CM | POA: Diagnosis not present

## 2017-06-13 DIAGNOSIS — S42002D Fracture of unspecified part of left clavicle, subsequent encounter for fracture with routine healing: Secondary | ICD-10-CM | POA: Diagnosis not present

## 2017-06-13 NOTE — Progress Notes (Signed)
This encounter was created in error - please disregard.

## 2017-06-14 DIAGNOSIS — M6281 Muscle weakness (generalized): Secondary | ICD-10-CM | POA: Diagnosis not present

## 2017-06-14 DIAGNOSIS — Z9181 History of falling: Secondary | ICD-10-CM | POA: Diagnosis not present

## 2017-06-14 DIAGNOSIS — N3946 Mixed incontinence: Secondary | ICD-10-CM | POA: Diagnosis not present

## 2017-06-14 DIAGNOSIS — M25512 Pain in left shoulder: Secondary | ICD-10-CM | POA: Diagnosis not present

## 2017-06-14 DIAGNOSIS — R278 Other lack of coordination: Secondary | ICD-10-CM | POA: Diagnosis not present

## 2017-06-14 DIAGNOSIS — S42002D Fracture of unspecified part of left clavicle, subsequent encounter for fracture with routine healing: Secondary | ICD-10-CM | POA: Diagnosis not present

## 2017-06-19 DIAGNOSIS — I4891 Unspecified atrial fibrillation: Secondary | ICD-10-CM | POA: Diagnosis not present

## 2017-06-26 DIAGNOSIS — I4891 Unspecified atrial fibrillation: Secondary | ICD-10-CM | POA: Diagnosis not present

## 2017-07-03 DIAGNOSIS — I4891 Unspecified atrial fibrillation: Secondary | ICD-10-CM | POA: Diagnosis not present

## 2017-07-03 LAB — POCT INR: INR: 2.6 — AB (ref <=1.1)

## 2017-07-03 LAB — PROTIME-INR: Protime: 27.6 — AB (ref 10.0–13.8)

## 2017-07-04 ENCOUNTER — Other Ambulatory Visit: Payer: Self-pay | Admitting: *Deleted

## 2017-07-11 ENCOUNTER — Encounter: Payer: Self-pay | Admitting: Nurse Practitioner

## 2017-07-11 ENCOUNTER — Non-Acute Institutional Stay: Payer: Medicare Other | Admitting: Nurse Practitioner

## 2017-07-11 DIAGNOSIS — E871 Hypo-osmolality and hyponatremia: Secondary | ICD-10-CM | POA: Insufficient documentation

## 2017-07-11 DIAGNOSIS — D638 Anemia in other chronic diseases classified elsewhere: Secondary | ICD-10-CM

## 2017-07-11 DIAGNOSIS — Z7901 Long term (current) use of anticoagulants: Secondary | ICD-10-CM | POA: Diagnosis not present

## 2017-07-11 DIAGNOSIS — I10 Essential (primary) hypertension: Secondary | ICD-10-CM | POA: Diagnosis not present

## 2017-07-11 DIAGNOSIS — G250 Essential tremor: Secondary | ICD-10-CM | POA: Diagnosis not present

## 2017-07-11 DIAGNOSIS — I480 Paroxysmal atrial fibrillation: Secondary | ICD-10-CM

## 2017-07-11 NOTE — Assessment & Plan Note (Signed)
Therapeutic INR 07/03/17 2.6, continue Coumadin 3mg  M-F, 4mg  Sat+Sun, pending PT/INR, update CBC

## 2017-07-11 NOTE — Assessment & Plan Note (Signed)
01/26/17 Hgb 13.0, 04/26/17 Hgb 10.6, 05/03/17, Hgb 11.2, will update CBC in setting of chronic anticoagulation therapy, no sign of bleeding presently.

## 2017-07-11 NOTE — Assessment & Plan Note (Signed)
Afib, heart rate is in control, continue Propranolol 80mg  qd, she takes Coumadin for anticoagulation therapy to reduce thromboembolic risk reduction.

## 2017-07-11 NOTE — Assessment & Plan Note (Signed)
Her blood pressure is controlled, continue Propranolol 80mg  qd, Chlorthalidone 25mg  qd, fine tremor in fingers is mild and not disabling.

## 2017-07-11 NOTE — Assessment & Plan Note (Signed)
Last Na 133 05/17/17, update CMP, TSH(last 0.64 10/17/12)

## 2017-07-11 NOTE — Progress Notes (Signed)
Location:  [facility] Room Number: 806 Place of Service:  ALF [phone] Provider:  Mast, Manxie  NP  Oneal GroutPandey, Mahima, MD  Patient Care Team: Oneal GroutPandey, Mahima, MD as PCP - General (Internal Medicine)  Extended Emergency Contact Information Primary Emergency Contact: Morgan,Tom Address: [address]          [place], [place] [address] [place] Home Phone: [phone] Relation: Friend Secondary Emergency Contact: Fletcher,Christa  [place] Home Phone: [phone] Relation: Niece  Code Status:  Full Code Goals of care: Advanced Directive information Advanced Directives 05/29/2017  Does Patient Have a Medical Advance Directive? No  Would patient like information on creating a medical advance directive? No - Patient declined     Chief Complaint  Patient presents with  . Medical Management of Chronic Issues    HPI:  Pt is a 83 y.o. female seen today for medical management of chronic diseases.     The patient has history of Afib, heart rate is in control while on Propranolol 80mg  qd, she takes Coumadin for anticoagulation therapy to reduce thromboembolic risk reduction. Her blood pressure is controlled, on Chlorthalidone 25mg  qd, fine tremor in fingers is mild and not disabling.    Past Medical History:  Diagnosis Date  . Anticoagulant long-term use   . Atrial fibrillation (HCC)   . CHF (congestive heart failure) (HCC)   . Chronic anticoagulation   . Hypertension   . Raynaud phenomenon   . Tremor, essential    Past Surgical History:  Procedure Laterality Date  . CHOLECYSTECTOMY    . TONSILLECTOMY      Allergies  Allergen Reactions  . Fosamax [Alendronate]   . Other Nausea And Vomiting    Green pepper    Outpatient Encounter Medications as of 07/11/2017  Medication Sig  . calcium carbonate (OSCAL) 1500 (600 Ca) MG TABS tablet Take 600 mg of elemental calcium by mouth 2 (two) times daily with a meal.  .  chlorthalidone (HYGROTON) 25 MG tablet Take 25 mg by mouth daily.   Marland Kitchen. gabapentin (NEURONTIN) 100 MG capsule Take 100 mg by mouth 2 (two) times daily.  . propranolol ER (INDERAL LA) 80 MG 24 hr capsule TAKE 1 CAPSULE BY MOUTH  DAILY  . traMADol (ULTRAM) 50 MG tablet Take 50 mg by mouth every 8 (eight) hours as needed for moderate pain.   Marland Kitchen. UNABLE TO FIND Med Name: Vicks vapor rub. Apply under nostrils 2 times daily as needed.  . warfarin (COUMADIN) 3 MG tablet Take 3 mg by mouth daily at 6 PM. Take Mon- Fri.  . warfarin (COUMADIN) 4 MG tablet Take 4 mg by mouth daily at 6 PM. Take on Sat and Sun.   No facility-administered encounter medications on file as of 07/11/2017.     Review of Systems  Constitutional: Negative for activity change, appetite change, chills, diaphoresis, fatigue and fever.  HENT: Positive for hearing loss. Negative for trouble swallowing and voice change.   Eyes: Negative for visual disturbance.  Respiratory: Negative for cough, choking, chest tightness, shortness of breath and wheezing.   Cardiovascular: Negative for chest pain, palpitations and leg swelling.  Gastrointestinal: Negative for abdominal distention, abdominal pain, constipation, diarrhea, nausea and vomiting.  Endocrine: Negative for cold intolerance.  Genitourinary: Negative for difficulty urinating, dysuria, frequency and urgency.  Musculoskeletal: Positive for gait problem. Negative for arthralgias and back pain.       Ambulates with walker.   Skin: Negative for  color change, pallor, rash and wound.  Neurological: Positive for tremors. Negative for speech difficulty, weakness and headaches.  Psychiatric/Behavioral: Negative for agitation, behavioral problems, confusion, hallucinations and sleep disturbance. The patient is not nervous/anxious.     Immunization History  Administered Date(s) Administered  . Influenza-Unspecified 05/28/2017   Pertinent  Health Maintenance Due  Topic Date Due  . PNA vac  Low Risk Adult (1 of 2 - PCV13) 06/02/1986  . INFLUENZA VACCINE  Completed  . DEXA SCAN  Completed   No flowsheet data found. Functional Status Survey:    Vitals:   07/11/17 1157  BP: 130/70  Pulse: 68  Resp: 18  Temp: 98.3 F (36.8 C)  Weight: 128 lb 12.8 oz (58.4 kg)  Height: 5\' 6"  (1.676 m)   Body mass index is 20.79 kg/m. Physical Exam  Constitutional: She appears well-developed and well-nourished.  HENT:  Head: Normocephalic and atraumatic.  Eyes: Conjunctivae and EOM are normal. Pupils are equal, round, and reactive to light.  Neck: Normal range of motion. Neck supple. No JVD present. No thyromegaly present.  Cardiovascular: Normal rate.  Murmur heard. Irregular heart beats.   Pulmonary/Chest: Effort normal and breath sounds normal. She has no wheezes. She has no rales.  Abdominal: Soft. Bowel sounds are normal. She exhibits no distension. There is no tenderness.  Musculoskeletal: Normal range of motion. She exhibits no edema or tenderness.  Ambulates with walker, transfer self.   Neurological: She is alert. She exhibits normal muscle tone. Coordination normal.  Oriented to person and place. Fine tremors in fingers, not disabling.   Skin: Skin is warm and dry. No pallor.  Psychiatric: She has a normal mood and affect. Her behavior is normal. Judgment and thought content normal.    Labs reviewed: Recent Labs    01/26/17 1317 04/26/17 05/03/17 05/17/17  NA 136 126* 133* 133*  K 4.0 4.3 4.2 3.7  CL 100*  --   --   --   CO2 26  --   --   --   GLUCOSE 104*  --   --   --   BUN 15 28* 24* 16  CREATININE 1.01* 1.2* 0.9 1.0  CALCIUM 9.4  --   --   --    Recent Labs    04/26/17  AST 15  ALT 8  ALKPHOS 34   Recent Labs    01/26/17 1317 04/26/17 05/03/17  WBC 7.5 9.1 8.0  NEUTROABS 4.8  --   --   HGB 13.0 10.6* 11.2*  HCT 39.3 31* 32*  MCV 90.1  --   --   PLT 218 209 254   Lab Results  Component Value Date   TSH 0.64 10/17/2012   No results found for:  HGBA1C No results found for: CHOL, HDL, LDLCALC, LDLDIRECT, TRIG, CHOLHDL  Significant Diagnostic Results in last 30 days:  No results found.  Assessment/Plan Clavicle fracture Pain is not apparent, continue Gabapentin 100mg  qd. Tramadol 50mg  q8h prn.   A-fib (HCC) Afib, heart rate is in control, continue Propranolol 80mg  qd, she takes Coumadin for anticoagulation therapy to reduce thromboembolic risk reduction.   Hypertension Her blood pressure is controlled, continue Propranolol 80mg  qd, Chlorthalidone 25mg  qd, fine tremor in fingers is mild and not disabling.    Tremor, essential  fine tremor in fingers is mild and not disabling, continue Propranolol 80mg  qd    Hyponatremia Last Na 133 05/17/17, update CMP, TSH(last 0.64 10/17/12)   Chronic anticoagulation Therapeutic INR 07/03/17 2.6, continue  Coumadin 3mg  M-F, 4mg  Sat+Sun, pending PT/INR, update CBC  Anemia, chronic disease 01/26/17 Hgb 13.0, 04/26/17 Hgb 10.6, 05/03/17, Hgb 11.2, will update CBC in setting of chronic anticoagulation therapy, no sign of bleeding presently.      Family/ staff Communication: plan of care reviewed with the patient and charge nurse. Prescription of Prevnar 13 and Tdap given  Labs/tests ordered: MMSE, CBC, CMP, TSH  Time spend 25 minutes.

## 2017-07-11 NOTE — Assessment & Plan Note (Signed)
fine tremor in fingers is mild and not disabling, continue Propranolol 80mg  qd

## 2017-07-11 NOTE — Assessment & Plan Note (Addendum)
Pain is not apparent, continue Gabapentin 100mg  qd. Tramadol 50mg  q8h prn.

## 2017-07-12 DIAGNOSIS — D649 Anemia, unspecified: Secondary | ICD-10-CM | POA: Diagnosis not present

## 2017-07-12 LAB — CBC AND DIFFERENTIAL
HCT: 40 (ref 36–46)
HEMOGLOBIN: 13.4 (ref 12.0–16.0)
PLATELETS: 223 (ref 150–399)
WBC: 6.7

## 2017-07-12 LAB — BASIC METABOLIC PANEL
BUN: 25 — AB (ref 4–21)
Creatinine: 1 (ref <=1.1)
Glucose: 85
Potassium: 3.4 (ref 3.4–5.3)
SODIUM: 139 (ref 137–147)

## 2017-07-12 LAB — HEPATIC FUNCTION PANEL
ALK PHOS: 39 (ref 25–125)
ALT: 12 (ref 7–35)
AST: 22 (ref 13–35)
BILIRUBIN, TOTAL: 0.5

## 2017-07-12 LAB — TSH: TSH: 0.9 (ref <=5.90)

## 2017-07-13 ENCOUNTER — Other Ambulatory Visit: Payer: Self-pay | Admitting: *Deleted

## 2017-07-17 DIAGNOSIS — R29818 Other symptoms and signs involving the nervous system: Secondary | ICD-10-CM | POA: Diagnosis not present

## 2017-07-17 DIAGNOSIS — I10 Essential (primary) hypertension: Secondary | ICD-10-CM | POA: Diagnosis not present

## 2017-07-17 DIAGNOSIS — I5031 Acute diastolic (congestive) heart failure: Secondary | ICD-10-CM | POA: Diagnosis not present

## 2017-07-24 DIAGNOSIS — I10 Essential (primary) hypertension: Secondary | ICD-10-CM | POA: Diagnosis not present

## 2017-07-24 DIAGNOSIS — I4891 Unspecified atrial fibrillation: Secondary | ICD-10-CM | POA: Diagnosis not present

## 2017-07-24 DIAGNOSIS — Z79899 Other long term (current) drug therapy: Secondary | ICD-10-CM | POA: Diagnosis not present

## 2017-08-09 ENCOUNTER — Other Ambulatory Visit: Payer: Self-pay | Admitting: *Deleted

## 2017-08-09 DIAGNOSIS — I10 Essential (primary) hypertension: Secondary | ICD-10-CM | POA: Diagnosis not present

## 2017-08-09 LAB — BASIC METABOLIC PANEL
BUN: 29 — AB (ref 4–21)
Creatinine: 1 (ref <=1.1)
GLUCOSE: 82
POTASSIUM: 3.3 — AB (ref 3.4–5.3)
SODIUM: 136 — AB (ref 137–147)

## 2017-08-10 ENCOUNTER — Other Ambulatory Visit: Payer: Self-pay | Admitting: *Deleted

## 2017-08-10 DIAGNOSIS — E876 Hypokalemia: Secondary | ICD-10-CM

## 2017-08-10 MED ORDER — POTASSIUM CHLORIDE ER 10 MEQ PO TBCR: 10.0000 meq | EXTENDED_RELEASE_TABLET | Freq: Every day | ORAL | Status: DC

## 2017-08-16 ENCOUNTER — Other Ambulatory Visit: Payer: Self-pay | Admitting: *Deleted

## 2017-08-16 DIAGNOSIS — I10 Essential (primary) hypertension: Secondary | ICD-10-CM | POA: Diagnosis not present

## 2017-08-16 LAB — BASIC METABOLIC PANEL
BUN: 32 — AB (ref 4–21)
CREATININE: 1.1 (ref <=1.1)
Glucose: 78
Potassium: 3.7 (ref 3.4–5.3)
SODIUM: 137 (ref 137–147)

## 2017-08-23 DIAGNOSIS — R062 Wheezing: Secondary | ICD-10-CM | POA: Diagnosis not present

## 2017-08-23 DIAGNOSIS — I4891 Unspecified atrial fibrillation: Secondary | ICD-10-CM | POA: Diagnosis not present

## 2017-08-30 DIAGNOSIS — I4891 Unspecified atrial fibrillation: Secondary | ICD-10-CM | POA: Diagnosis not present

## 2017-09-03 DIAGNOSIS — Z7901 Long term (current) use of anticoagulants: Secondary | ICD-10-CM | POA: Diagnosis not present

## 2017-09-03 DIAGNOSIS — I5021 Acute systolic (congestive) heart failure: Secondary | ICD-10-CM | POA: Diagnosis not present

## 2017-09-11 DIAGNOSIS — I5021 Acute systolic (congestive) heart failure: Secondary | ICD-10-CM | POA: Diagnosis not present

## 2017-09-11 DIAGNOSIS — Z7901 Long term (current) use of anticoagulants: Secondary | ICD-10-CM | POA: Diagnosis not present

## 2017-09-11 LAB — POCT INR: INR: 3.2 — AB (ref <=1.1)

## 2017-09-11 LAB — PROTIME-INR: Protime: 34.5 — AB (ref 10.0–13.8)

## 2017-09-12 ENCOUNTER — Other Ambulatory Visit: Payer: Self-pay | Admitting: *Deleted

## 2017-09-12 DIAGNOSIS — Z7901 Long term (current) use of anticoagulants: Secondary | ICD-10-CM | POA: Diagnosis not present

## 2017-09-12 DIAGNOSIS — I5021 Acute systolic (congestive) heart failure: Secondary | ICD-10-CM | POA: Diagnosis not present

## 2017-09-13 DIAGNOSIS — Z7901 Long term (current) use of anticoagulants: Secondary | ICD-10-CM | POA: Diagnosis not present

## 2017-09-13 DIAGNOSIS — I5021 Acute systolic (congestive) heart failure: Secondary | ICD-10-CM | POA: Diagnosis not present

## 2017-09-13 LAB — POCT INR: INR: 2.1 — AB (ref 0.9–1.1)

## 2017-09-13 LAB — PROTIME-INR: Protime: 22.2 — AB (ref 10.0–13.8)

## 2017-09-20 DIAGNOSIS — D689 Coagulation defect, unspecified: Secondary | ICD-10-CM | POA: Diagnosis not present

## 2017-09-20 DIAGNOSIS — I4891 Unspecified atrial fibrillation: Secondary | ICD-10-CM | POA: Diagnosis not present

## 2017-09-20 DIAGNOSIS — I10 Essential (primary) hypertension: Secondary | ICD-10-CM | POA: Diagnosis not present

## 2017-09-20 DIAGNOSIS — Z95810 Presence of automatic (implantable) cardiac defibrillator: Secondary | ICD-10-CM | POA: Diagnosis not present

## 2017-09-20 DIAGNOSIS — Z7901 Long term (current) use of anticoagulants: Secondary | ICD-10-CM | POA: Diagnosis not present

## 2017-10-04 DIAGNOSIS — I5021 Acute systolic (congestive) heart failure: Secondary | ICD-10-CM | POA: Diagnosis not present

## 2017-10-04 DIAGNOSIS — D689 Coagulation defect, unspecified: Secondary | ICD-10-CM | POA: Diagnosis not present

## 2017-10-05 ENCOUNTER — Non-Acute Institutional Stay: Payer: Medicare Other

## 2017-10-05 DIAGNOSIS — Z0000 Encounter for general adult medical examination without abnormal findings: Secondary | ICD-10-CM | POA: Diagnosis not present

## 2017-10-05 NOTE — Progress Notes (Signed)
Subjective:   Angelica Mejia is a 84 y.o. female who presents for Medicare Annual (Subsequent) preventive examination at [facility]     Objective:     Vitals: BP 118/60 (BP Location: Left Arm, Patient Position: Sitting)   Pulse 84   Temp 97.8 F (36.6 C) (Oral)   Ht 5\' 6"  (1.676 m)   Wt 129 lb (58.5 kg)   SpO2 96%   BMI 20.82 kg/m   Body mass index is 20.82 kg/m.  Advanced Directives 10/05/2017 05/29/2017 04/30/2017 04/27/2017 07/04/2016  Does Patient Have a Medical Advance Directive? No No No No No  Does patient want to make changes to medical advance directive? No - Patient declined - - - -  Would patient like information on creating a medical advance directive? - No - Patient declined No - Patient declined - -    Tobacco Social History   Tobacco Use  Smoking Status Never Smoker  Smokeless Tobacco Never Used     Counseling given: Not Answered   Clinical Intake:     Pain : No/denies pain     Nutritional Risks: None Diabetes: No     Interpreter Needed?: No  Information entered by :: Tyron Russell, RN  Past Medical History:  Diagnosis Date  . Anticoagulant long-term use   . Atrial fibrillation (HCC)   . CHF (congestive heart failure) (HCC)   . Chronic anticoagulation   . Hypertension   . Raynaud phenomenon   . Tremor, essential    Past Surgical History:  Procedure Laterality Date  . CHOLECYSTECTOMY    . TONSILLECTOMY     Family History  Problem Relation Age of Onset  . Hypertension Father   . Heart attack Father   . Heart disease Unknown   . Hypertension Unknown   . Stroke Unknown   . Cancer Unknown    Social History   Socioeconomic History  . Marital status: Widowed    Spouse name: None  . Number of children: 0  . Years of education: 87  . Highest education level: None  Social Needs  . Financial resource strain: Not hard at all  . Food insecurity - worry: Never true  . Food insecurity - inability: Never  true  . Transportation needs - medical: No  . Transportation needs - non-medical: No  Occupational History  . Occupation: Retired- Automotive engineer  Tobacco Use  . Smoking status: Never Smoker  . Smokeless tobacco: Never Used  Substance and Sexual Activity  . Alcohol use: No  . Drug use: No  . Sexual activity: None  Other Topics Concern  . None  Social History Narrative   Tobacco use, amount per day now: No      Past tobacco use, amount per day:      How many years did you use tobacco:      Alcohol use (drinks per week): occasional wine      Diet:      Do you drink/eat things with caffeine? Decaf coffee.      Marital status: Widowed            What year were you married?      Do you live in a house, apartment, assisted living, condo, trailer? Assisted living      Is it one or more stories? 1      How many persons live in your home? 1      Do you have any pets in your home? 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       Do you exercise? No            How often?      Do you have a living will?       Do you have a DNR form? No           If not, do you want to discuss one? Yes      Do you have signed POA/HPOA forms? Yes                 Outpatient Encounter Medications as of 10/05/2017  Medication Sig  . calcium carbonate (OSCAL) 1500 (600 Ca) MG TABS tablet Take 600 mg of elemental calcium by mouth 2 (two) times daily with a meal.  . chlorthalidone (HYGROTON) 25 MG tablet Take 25 mg by mouth daily.   Marland Kitchen gabapentin (NEURONTIN) 100 MG capsule Take 100 mg by mouth 2 (two) times daily.  . potassium chloride (K-DUR) 10 MEQ tablet Take 1 tablet (10 mEq total) by mouth daily.  . propranolol ER (INDERAL LA) 80 MG 24 hr capsule TAKE 1 CAPSULE BY MOUTH  DAILY  . traMADol (ULTRAM) 50 MG tablet Take 50 mg by mouth every 8 (eight) hours as needed for moderate pain.   Marland Kitchen UNABLE TO FIND Med Name: Vicks vapor rub. Apply under nostrils 2 times daily as needed.  .  warfarin (COUMADIN) 3 MG tablet Take 3 mg by mouth daily at 6 PM. Take Mon- Fri.  . warfarin (COUMADIN) 4 MG tablet Take 4 mg by mouth daily at 6 PM. Take on Sat and Sun.   No facility-administered encounter medications on file as of 10/05/2017.     Activities of Daily Living In your present state of health, do you have any difficulty performing the following activities: 10/05/2017  Hearing? Y  Vision? Y  Difficulty concentrating or making decisions? Y  Walking or climbing stairs? Y  Dressing or bathing? Y  Doing errands, shopping? Y  Preparing Food and eating ? Y  Using the Toilet? Y  In the past six months, have you accidently leaked urine? Y  Do you have problems with loss of bowel control? Y  Managing your Medications? Y  Managing your Finances? Y  Housekeeping or managing your Housekeeping? Y  Some recent data might be hidden    Patient Care Team: Oneal Grout, MD as PCP - General (Internal Medicine)    Assessment:   This is a routine wellness examination for Angelica Mejia.  Exercise Activities and Dietary recommendations Current Exercise Habits: The patient does not participate in regular exercise at present, Exercise limited by: None identified  Goals    None      Fall Risk Fall Risk  10/05/2017  Falls in the past year? No   Is the patient's home free of loose throw rugs in walkways, pet beds, electrical cords, etc?   yes      Grab bars in the bathroom? yes      Handrails on the stairs?   yes      Adequate lighting?   yes  Depression Screen PHQ 2/9 Scores 10/05/2017  PHQ - 2 Score 0     Cognitive Function MMSE - Mini Mental State Exam 07/11/2017  Orientation to time 5  Orientation to Place 4  Registration 3  Attention/ Calculation 4  Recall 3  Language- name 2 objects 2  Language- repeat 1  Language- follow 3 step command 3  Language-  read & follow direction 1  Write a sentence 1  Copy design 1  Total score 28        Immunization History  Administered  Date(s) Administered  . Influenza-Unspecified 05/28/2017    Qualifies for Shingles Vaccine? Not in past records  Screening Tests Health Maintenance  Topic Date Due  . PNA vac Low Risk Adult (1 of 2 - PCV13) 06/02/1986  . TETANUS/TDAP  08/07/2018 (Originally 06/02/1938)  . INFLUENZA VACCINE  Completed  . DEXA SCAN  Completed    Cancer Screenings: Lung: Low Dose CT Chest recommended if Age 45-80 years, 30 pack-year currently smoking OR have quit w/in 15years. Patient does not qualify. Breast:  Up to date on Mammogram? Yes   Up to date of Bone Density/Dexa? Yes Colorectal: up to date  Additional Screenings: Hepatitis C Screening: declined PNA 13 due-ordered TDAP due-ordered     Plan:    I have personally reviewed and addressed the Medicare Annual Wellness questionnaire and have noted the following in the patient's chart:  A. Medical and social history B. Use of alcohol, tobacco or illicit drugs  C. Current medications and supplements D. Functional ability and status E.  Nutritional status F.  Physical activity G. Advance directives H. List of other physicians I.  Hospitalizations, surgeries, and ER visits in previous 12 months J.  Vitals K. Screenings to include hearing, vision, cognitive, depression L. Referrals and appointments - none  In addition, I have reviewed and discussed with patient certain preventive protocols, quality metrics, and best practice recommendations. A written personalized care plan for preventive services as well as general preventive health recommendations were provided to patient.  See attached scanned questionnaire for additional information.   Signed,   Tyron RussellSara Yarden Hillis, RN Nurse Health Advisor  Patient Concerns: none

## 2017-10-05 NOTE — Patient Instructions (Signed)
[name] , Thank you for taking time to come for your Medicare Wellness Visit. I appreciate your ongoing commitment to your health goals. Please review the following plan we discussed and let me know if I can assist you in the future.   Screening recommendations/referrals: Colonoscopy excluded, pt is over age 82 Mammogram excluded, pt is over age 82 Bone Density up to date Recommended yearly ophthalmology/optometry visit for glaucoma screening and checkup Recommended yearly dental visit for hygiene and checkup  Vaccinations: Influenza vaccine up to date, due 2019 fall season Pneumococcal vaccine 13 due, ordered Tdap vaccine due, ordered Shingles vaccine not in past records    Advanced directives: in chart  Conditions/risks identified: none  Next appointment: Dr. Glade LloydPandey makes rounds   Preventive Care 65 Years and Older, Female Preventive care refers to lifestyle choices and visits with your health care provider that can promote health and wellness. What does preventive care include?  A yearly physical exam. This is also called an annual well check.  Dental exams once or twice a year.  Routine eye exams. Ask your health care provider how often you should have your eyes checked.  Personal lifestyle choices, including:  Daily care of your teeth and gums.  Regular physical activity.  Eating a healthy diet.  Avoiding tobacco and drug use.  Limiting alcohol use.  Practicing safe sex.  Taking low-dose aspirin every day.  Taking vitamin and mineral supplements as recommended by your health care provider. What happens during an annual well check? The services and screenings done by your health care provider during your annual well check will depend on your age, overall health, lifestyle risk factors, and family history of disease. Counseling  Your health care provider may ask you questions about your:  Alcohol use.  Tobacco use.  Drug use.  Emotional  well-being.  Home and relationship well-being.  Sexual activity.  Eating habits.  History of falls.  Memory and ability to understand (cognition).  Work and work Astronomerenvironment.  Reproductive health. Screening  You may have the following tests or measurements:  Height, weight, and BMI.  Blood pressure.  Lipid and cholesterol levels. These may be checked every 5 years, or more frequently if you are over 82 years old.  Skin check.  Lung cancer screening. You may have this screening every year starting at age 82 if you have a 30-pack-year history of smoking and currently smoke or have quit within the past 15 years.  Fecal occult blood test (FOBT) of the stool. You may have this test every year starting at age 82.  Flexible sigmoidoscopy or colonoscopy. You may have a sigmoidoscopy every 5 years or a colonoscopy every 10 years starting at age 82.  Hepatitis C blood test.  Hepatitis B blood test.  Sexually transmitted disease (STD) testing.  Diabetes screening. This is done by checking your blood sugar (glucose) after you have not eaten for a while (fasting). You may have this done every 1-3 years.  Bone density scan. This is done to screen for osteoporosis. You may have this done starting at age 82.  Mammogram. This may be done every 1-2 years. Talk to your health care provider about how often you should have regular mammograms. Talk with your health care provider about your test results, treatment options, and if necessary, the need for more tests. Vaccines  Your health care provider may recommend certain vaccines, such as:  Influenza vaccine. This is recommended every year.  Tetanus, diphtheria, and acellular pertussis (Tdap,  Td) vaccine. You may need a Td booster every 10 years.  Zoster vaccine. You may need this after age 20.  Pneumococcal 13-valent conjugate (PCV13) vaccine. One dose is recommended after age 70.  Pneumococcal polysaccharide (PPSV23) vaccine. One  dose is recommended after age 52. Talk to your health care provider about which screenings and vaccines you need and how often you need them. This information is not intended to replace advice given to you by your health care provider. Make sure you discuss any questions you have with your health care provider. Document Released: 08/20/2015 Document Revised: 04/12/2016 Document Reviewed: 05/25/2015 Elsevier Interactive Patient Education  2017 [employer] Prevention in the Home Falls can cause injuries. They can happen to people of all ages. There are many things you can do to make your home safe and to help prevent falls. What can I do on the outside of my home?  Regularly fix the edges of walkways and driveways and fix any cracks.  Remove anything that might make you trip as you walk through a door, such as a raised step or threshold.  Trim any bushes or trees on the path to your home.  Use bright outdoor lighting.  Clear any walking paths of anything that might make someone trip, such as rocks or tools.  Regularly check to see if handrails are loose or broken. Make sure that both sides of any steps have handrails.  Any raised decks and porches should have guardrails on the edges.  Have any leaves, snow, or ice cleared regularly.  Use sand or salt on walking paths during winter.  Clean up any spills in your garage right away. This includes oil or grease spills. What can I do in the bathroom?  Use night lights.  Install grab bars by the toilet and in the tub and shower. Do not use towel bars as grab bars.  Use non-skid mats or decals in the tub or shower.  If you need to sit down in the shower, use a plastic, non-slip stool.  Keep the floor dry. Clean up any water that spills on the floor as soon as it happens.  Remove soap buildup in the tub or shower regularly.  Attach bath mats securely with double-sided non-slip rug tape.  Do not have throw rugs and other  things on the floor that can make you trip. What can I do in the bedroom?  Use night lights.  Make sure that you have a light by your bed that is easy to reach.  Do not use any sheets or blankets that are too big for your bed. They should not hang down onto the floor.  Have a firm chair that has side arms. You can use this for support while you get dressed.  Do not have throw rugs and other things on the floor that can make you trip. What can I do in the kitchen?  Clean up any spills right away.  Avoid walking on wet floors.  Keep items that you use a lot in easy-to-reach places.  If you need to reach something above you, use a strong step stool that has a grab bar.  Keep electrical cords out of the way.  Do not use floor polish or wax that makes floors slippery. If you must use wax, use non-skid floor wax.  Do not have throw rugs and other things on the floor that can make you trip. What can I do with my stairs?  Do not  leave any items on the stairs.  Make sure that there are handrails on both sides of the stairs and use them. Fix handrails that are broken or loose. Make sure that handrails are as long as the stairways.  Check any carpeting to make sure that it is firmly attached to the stairs. Fix any carpet that is loose or worn.  Avoid having throw rugs at the top or bottom of the stairs. If you do have throw rugs, attach them to the floor with carpet tape.  Make sure that you have a light switch at the top of the stairs and the bottom of the stairs. If you do not have them, ask someone to add them for you. What else can I do to help prevent falls?  Wear shoes that:  Do not have high heels.  Have rubber bottoms.  Are comfortable and fit you well.  Are closed at the toe. Do not wear sandals.  If you use a stepladder:  Make sure that it is fully opened. Do not climb a closed stepladder.  Make sure that both sides of the stepladder are locked into place.  Ask  someone to hold it for you, if possible.  Clearly mark and make sure that you can see:  Any grab bars or handrails.  First and last steps.  Where the edge of each step is.  Use tools that help you move around (mobility aids) if they are needed. These include:  Canes.  Walkers.  Scooters.  Crutches.  Turn on the lights when you go into a dark area. Replace any light bulbs as soon as they burn out.  Set up your furniture so you have a clear path. Avoid moving your furniture around.  If any of your floors are uneven, fix them.  If there are any pets around you, be aware of where they are.  Review your medicines with your doctor. Some medicines can make you feel dizzy. This can increase your chance of falling. Ask your doctor what other things that you can do to help prevent falls. This information is not intended to replace advice given to you by your health care provider. Make sure you discuss any questions you have with your health care provider. Document Released: 05/20/2009 Document Revised: 12/30/2015 Document Reviewed: 08/28/2014 Elsevier Interactive Patient Education  2017 [employer].

## 2017-10-09 ENCOUNTER — Non-Acute Institutional Stay: Payer: Medicare Other | Admitting: Internal Medicine

## 2017-10-09 ENCOUNTER — Encounter: Payer: Self-pay | Admitting: Internal Medicine

## 2017-10-09 DIAGNOSIS — G629 Polyneuropathy, unspecified: Secondary | ICD-10-CM

## 2017-10-09 DIAGNOSIS — I480 Paroxysmal atrial fibrillation: Secondary | ICD-10-CM | POA: Diagnosis not present

## 2017-10-09 DIAGNOSIS — E876 Hypokalemia: Secondary | ICD-10-CM | POA: Diagnosis not present

## 2017-10-09 DIAGNOSIS — N183 Chronic kidney disease, stage 3 unspecified: Secondary | ICD-10-CM | POA: Insufficient documentation

## 2017-10-09 DIAGNOSIS — I10 Essential (primary) hypertension: Secondary | ICD-10-CM | POA: Diagnosis not present

## 2017-10-09 DIAGNOSIS — G250 Essential tremor: Secondary | ICD-10-CM | POA: Diagnosis not present

## 2017-10-09 DIAGNOSIS — Z7901 Long term (current) use of anticoagulants: Secondary | ICD-10-CM | POA: Diagnosis not present

## 2017-10-09 DIAGNOSIS — I509 Heart failure, unspecified: Secondary | ICD-10-CM | POA: Diagnosis not present

## 2017-10-09 NOTE — Progress Notes (Signed)
Location:  [facility] Room Number: 806 Place of Service:  ALF [phone] Provider:  Oneal GroutMahima Jerri Hargadon MD  Oneal GroutPandey, Kasean Denherder, MD  Patient Care Team: Oneal GroutPandey, Dierra Riesgo, MD as PCP - General (Internal Medicine)  Extended Emergency Contact Information Primary Emergency Contact: Morgan,Tom Address: [address]          [place], [place] [address] [place] Home Phone: [phone] Relation: Friend Secondary Emergency Contact: Fletcher,Christa  [place] Home Phone: [phone] Relation: Niece  Code Status:  Full code  Goals of care: Advanced Directive information Advanced Directives 10/05/2017  Does Patient Have a Medical Advance Directive? No  Does patient want to make changes to medical advance directive? No - Patient declined  Would patient like information on creating a medical advance directive? -     Chief Complaint  Patient presents with  . Medical Management of Chronic Issues    Routine Visit     HPI:  Pt is a 84 y.o. female seen today for medical management of chronic diseases. She is pleasantly confused at times. She denies any particular health concern this visit. Per nursing, she wakes up around 3 am at times and gets dressed saying she is getting ready for breakfast. There are notes from nursing reporting increased daytime sleepiness. No other concerns.  Today, she is reading a book. Currently takes warfarin for stroke prophylaxis with history of afib, controlled HR.    Past Medical History:  Diagnosis Date  . Anticoagulant long-term use   . Atrial fibrillation (HCC)   . CHF (congestive heart failure) (HCC)   . Chronic anticoagulation   . Hypertension   . Raynaud phenomenon   . Tremor, essential    Past Surgical History:  Procedure Laterality Date  . CHOLECYSTECTOMY    . TONSILLECTOMY      Allergies  Allergen Reactions  . Fosamax [Alendronate]   . Other Nausea And Vomiting    Green pepper    Outpatient  Encounter Medications as of 10/09/2017  Medication Sig  . calcium carbonate (OSCAL) 1500 (600 Ca) MG TABS tablet Take 600 mg of elemental calcium by mouth 2 (two) times daily with a meal.  . chlorthalidone (HYGROTON) 25 MG tablet Take 25 mg by mouth daily.   Marland Kitchen. gabapentin (NEURONTIN) 100 MG capsule Take 100 mg by mouth daily.   . potassium chloride (K-DUR) 10 MEQ tablet Take 1 tablet (10 mEq total) by mouth daily.  . propranolol ER (INDERAL LA) 80 MG 24 hr capsule TAKE 1 CAPSULE BY MOUTH  DAILY  . UNABLE TO FIND Med Name: Vicks vapor rub. Apply under nostrils 2 times daily as needed.  . warfarin (COUMADIN) 3 MG tablet Take 3 mg by mouth daily at 6 PM. Take Mon- Fri.  . [DISCONTINUED] traMADol (ULTRAM) 50 MG tablet Take 50 mg by mouth every 8 (eight) hours as needed for moderate pain.   . [DISCONTINUED] warfarin (COUMADIN) 4 MG tablet Take 4 mg by mouth daily at 6 PM. Take on Sat and Sun.   No facility-administered encounter medications on file as of 10/09/2017.     Review of Systems  Constitutional: Negative for appetite change, chills and fever.  HENT: Positive for hearing loss. Negative for congestion, mouth sores, rhinorrhea, sinus pressure, sinus pain, sore throat, tinnitus and trouble swallowing.   Eyes: Negative for pain and redness.  Respiratory: Negative for cough, shortness of breath and wheezing.   Cardiovascular: Negative for chest pain and palpitations.  Gastrointestinal:  Negative for abdominal pain, constipation, nausea and vomiting.  Genitourinary: Negative for dysuria.  Musculoskeletal: Positive for gait problem. Negative for back pain.       No fall reported. Uses walker to ambulate  Skin: Negative for rash.  Neurological: Positive for tremors. Negative for dizziness and headaches.  Psychiatric/Behavioral: Positive for confusion and decreased concentration. Negative for behavioral problems.    Immunization History  Administered Date(s) Administered  . Influenza-Unspecified  05/28/2017   Pertinent  Health Maintenance Due  Topic Date Due  . PNA vac Low Risk Adult (1 of 2 - PCV13) 06/02/1986  . INFLUENZA VACCINE  Completed  . DEXA SCAN  Completed   Fall Risk  10/05/2017  Falls in the past year? No   Functional Status Survey:    Vitals:   10/09/17 1412  BP: 120/60  Pulse: 68  Resp: 18  Temp: (!) 97.5 F (36.4 C)  TempSrc: Oral  SpO2: 97%  Weight: 131 lb (59.4 kg)  Height: 5\' 4"  (1.626 m)   Body mass index is 22.49 kg/m.   Wt Readings from Last 3 Encounters:  10/09/17 131 lb (59.4 kg)  10/05/17 129 lb (58.5 kg)  07/11/17 128 lb 12.8 oz (58.4 kg)   Physical Exam  Constitutional:  Thin built, elderly female, in no acute distress  HENT:  Head: Normocephalic and atraumatic.  Mouth/Throat: Oropharynx is clear and moist. No oropharyngeal exudate.  Eyes: Conjunctivae and EOM are normal. Pupils are equal, round, and reactive to light. Right eye exhibits no discharge. Left eye exhibits no discharge.  Neck: Normal range of motion. Neck supple.  Cardiovascular: Intact distal pulses.  Irregular heart rate  Pulmonary/Chest: Effort normal and breath sounds normal. No respiratory distress. She has no wheezes. She has no rales.  Abdominal: Soft. Bowel sounds are normal. There is no tenderness. There is no rebound.  Musculoskeletal:  Trace leg edema, unsteady gait, uses 4 wheel walker with brake, arthritis changes to fingers  Lymphadenopathy:    She has no cervical adenopathy.  Neurological: She is alert.  Oriented to self, place and time but not to person otherwise, fine tremors to both hands at rest and activities  Skin: Skin is warm and dry. No rash noted. She is not diaphoretic.  Psychiatric: She has a normal mood and affect.    Labs reviewed: Recent Labs    01/26/17 1317  07/12/17 08/09/17 08/16/17  NA 136   < > 139 136* 137  K 4.0   < > 3.4 3.3* 3.7  CL 100*  --   --   --   --   CO2 26  --   --   --   --   GLUCOSE 104*  --   --   --   --     BUN 15   < > 25* 29* 32*  CREATININE 1.01*   < > 1.0 1.0 1.1  CALCIUM 9.4  --   --   --   --    < > = values in this interval not displayed.   Recent Labs    04/26/17 07/12/17  AST 15 22  ALT 8 12  ALKPHOS 34 39   Recent Labs    01/26/17 1317 04/26/17 05/03/17 07/12/17  WBC 7.5 9.1 8.0 6.7  NEUTROABS 4.8  --   --   --   HGB 13.0 10.6* 11.2* 13.4  HCT 39.3 31* 32* 40  MCV 90.1  --   --   --   PLT  218 209 254 223   Lab Results  Component Value Date   TSH 0.90 07/12/2017   No results found for: HGBA1C No results found for: CHOL, HDL, LDLCALC, LDLDIRECT, TRIG, CHOLHDL  Significant Diagnostic Results in last 30 days:  No results found.  Assessment/Plan  Chronic afib Controlled HR. Continue propranolol 80 mg daily for rate control. Continue warfarin for stroke prophylaxis.  Long term anticoagulation Continue warfarin with goal INR 2-3.   Essential tremors Continue propranolol current regimen.   Essential hypertension Continue chlorthalidone 25 mg daily, monitor BMP  Neuropathy Denies pain, numbness or tingling. No fall reported. Change gabapentin from 100 mg in am to 100 mg at bedtime to see if this will help with her daytime sleepiness.  Hypokalemia Continue kcl 10 meq daily, reviewed BMP  She is a full code. We will need a goals of care discussion with patient and her family. Have contacted social worker for the facility to help set up a meeting.    Family/ staff Communication: reviewed care plan with patient and charge nurse.    Labs/tests ordered:  Cbc, cmp   Oneal Grout, MD Internal Medicine [facility] [address] [place], [place] [address] Cell Phone (Monday-Friday 8 am - 5 pm): [phone] On Call: [phone] and follow prompts after 5 pm and on weekends Office Phone: [phone] Office Fax: [phone]

## 2017-10-16 ENCOUNTER — Non-Acute Institutional Stay: Payer: Medicare Other | Admitting: Nurse Practitioner

## 2017-10-16 DIAGNOSIS — G629 Polyneuropathy, unspecified: Secondary | ICD-10-CM

## 2017-10-16 DIAGNOSIS — R410 Disorientation, unspecified: Secondary | ICD-10-CM

## 2017-10-16 DIAGNOSIS — F0150 Vascular dementia without behavioral disturbance: Secondary | ICD-10-CM | POA: Insufficient documentation

## 2017-10-16 DIAGNOSIS — I5022 Chronic systolic (congestive) heart failure: Secondary | ICD-10-CM | POA: Diagnosis not present

## 2017-10-16 DIAGNOSIS — G309 Alzheimer's disease, unspecified: Secondary | ICD-10-CM

## 2017-10-16 DIAGNOSIS — F0280 Dementia in other diseases classified elsewhere without behavioral disturbance: Secondary | ICD-10-CM | POA: Insufficient documentation

## 2017-10-16 NOTE — Assessment & Plan Note (Signed)
The patient has history of neuropathy, she denied pain, tingling, numbness, or paresthesia, reduced  Gabapentin 100mg  qdbid in Oct 2018, then 100mg  q 6pm from qam since 10/09/17. The patient still has early night awake and sometime sleepy during day. Change Gabapentin 100mg  daily prn/6pm daily. Observe.

## 2017-10-16 NOTE — Assessment & Plan Note (Signed)
increased confusion as to the time of the day, becoming mor combative with staff when trying to reorient. Reported she got up 3 am and wants to eat breakfast 10/08/17. 10/14/17 going back to the dinning room after just eating and claiming she was never fed. She waking up 10pm, fully dressed for the day and wondering where everybody else is and if its time for breakfast. Staff noted this steady progression over the past a few months. She is irritated when she is redirected.  Will update CBC CMP sooner from scheduled 10/30/17 to 10/18/17. Obtain MMSE. Change Gabapentin to daily prn. Observe.

## 2017-10-16 NOTE — Progress Notes (Signed)
Location:   [facility] Room Number: 806 Place of Service:  ALF (13) Provider: Arna SnipeManXie Lizzete Gough NP  Oneal GroutPandey, Mahima, MD  Patient Care Team: Oneal GroutPandey, Mahima, MD as PCP - General (Internal Medicine)  Extended Emergency Contact Information Primary Emergency Contact: Morgan,Tom Address: [address]          [place], [place] [address] [place] Home Phone: [phone] Relation: Friend Secondary Emergency Contact: Brandy HaleFletcher,Christa  [place] Home Phone: [phone] Relation: Niece  Code Status: DNR Goals of care: Advanced Directive information Advanced Directives 10/05/2017  Does Patient Have a Medical Advance Directive? No  Does patient want to make changes to medical advance directive? No - Patient declined  Would patient like information on creating a medical advance directive? -     Chief Complaint  Patient presents with  . Acute Visit    Increased confusion, combative with staff when redirected.    HPI:  Pt is a 84 y.o. female seen today for an acute visit for increased confusion as to the time of the day, becoming mor combative with staff when trying to reorient. Reported she got up 3 am and wants to eat breakfast 10/08/17. 10/14/17 going back to the dinning room after just eating and claiming she was never fed. She waking up 10pm, fully dressed for the day and wondering where everybody else is and if its time for breakfast. Staff noted this steady progression over the past a few months. She is irritated when she is redirected.   The patient has history of neuropathy, she denied pain, tingling, numbness, or paresthesia, reduced  Gabapentin 100mg  qdbid in Oct 2018, then 100mg  q 6pm from qam since 10/09/17. The patient still has early night awake and sometime sleepy during day.      Past Medical History:  Diagnosis Date  . Anticoagulant long-term use   . Atrial fibrillation (HCC)   . CHF (congestive heart failure) (HCC)   . Chronic  anticoagulation   . Hypertension   . Raynaud phenomenon   . Tremor, essential    Past Surgical History:  Procedure Laterality Date  . CHOLECYSTECTOMY    . TONSILLECTOMY      Allergies  Allergen Reactions  . Fosamax [Alendronate]   . Other Nausea And Vomiting    Green pepper    Allergies as of 10/16/2017      Reactions   Fosamax [alendronate]    Other Nausea And Vomiting   Green pepper      Medication List        Accurate as of 10/16/17 11:59 PM. Always use your most recent med list.          calcium carbonate 1500 (600 Ca) MG Tabs tablet Commonly known as:  OSCAL Take 600 mg of elemental calcium by mouth 2 (two) times daily with a meal.   chlorthalidone 25 MG tablet Commonly known as:  HYGROTON Take 25 mg by mouth daily.   gabapentin 100 MG capsule Commonly known as:  NEURONTIN Take 100 mg by mouth daily.   potassium chloride 10 MEQ tablet Commonly known as:  K-DUR Take 1 tablet (10 mEq total) by mouth daily.   propranolol ER 80 MG 24 hr capsule Commonly known as:  INDERAL LA TAKE 1 CAPSULE BY MOUTH  DAILY   UNABLE TO FIND Med Name: Vicks vapor rub. Apply under nostrils 2 times daily as needed.   warfarin 3 MG tablet Commonly known as:  COUMADIN Take 3 mg  by mouth daily at 6 PM. Take Mon- Fri.       Review of Systems  Constitutional: Negative for activity change, appetite change, chills, diaphoresis, fatigue and fever.  HENT: Positive for hearing loss. Negative for congestion, trouble swallowing and voice change.   Eyes: Negative for visual disturbance.  Respiratory: Negative for cough, choking, chest tightness, shortness of breath and wheezing.   Cardiovascular: Positive for leg swelling. Negative for chest pain and palpitations.  Gastrointestinal: Negative for abdominal distention, abdominal pain, constipation, diarrhea, nausea and vomiting.  Genitourinary: Negative for difficulty urinating, dysuria and urgency.  Musculoskeletal: Positive for  gait problem. Negative for arthralgias and back pain.       Ambulates with walker.   Skin: Negative for color change and pallor.  Neurological: Positive for tremors. Negative for dizziness, speech difficulty, weakness and headaches.       R+L hand tremor.   Psychiatric/Behavioral: Positive for behavioral problems, confusion and sleep disturbance. Negative for agitation and hallucinations. The patient is not nervous/anxious.     Immunization History  Administered Date(s) Administered  . Influenza-Unspecified 05/28/2017  . Pneumococcal Conjugate-13 07/13/2017  . Tdap 07/13/2017   Pertinent  Health Maintenance Due  Topic Date Due  . PNA vac Low Risk Adult (2 of 2 - PPSV23) 07/13/2018  . INFLUENZA VACCINE  Completed  . DEXA SCAN  Completed   Fall Risk  10/05/2017  Falls in the past year? No   Functional Status Survey:    Vitals:   10/16/17 1026  BP: 120/65  Pulse: 70  Resp: 18  Temp: 98 F (36.7 C)  SpO2: 97%  Weight: 130 lb (59 kg)   Body mass index is 22.31 kg/m. Physical Exam  Constitutional: She appears well-developed and well-nourished.  HENT:  Head: Normocephalic and atraumatic.  Eyes: Conjunctivae and EOM are normal. Pupils are equal, round, and reactive to light.  Neck: Normal range of motion. Neck supple. No JVD present. No thyromegaly present.  Cardiovascular: Normal rate.  No murmur heard. afib  Pulmonary/Chest: Effort normal and breath sounds normal. No respiratory distress. She has no wheezes. She has no rales.  Abdominal: Soft. Bowel sounds are normal. She exhibits no distension. There is no tenderness.  Musculoskeletal: Normal range of motion. She exhibits edema. She exhibits no tenderness.  Trace edema BLE. Ambulates with walker.   Neurological: She is alert. No cranial nerve deficit. She exhibits normal muscle tone. Coordination normal.  Tremor R+L hands. Oriented to person and place.   Skin: Skin is warm and dry. No erythema.  Psychiatric: She has a  normal mood and affect. Her behavior is normal.  Early am awake, sleepy during day    Labs reviewed: Recent Labs    01/26/17 1317  07/12/17 08/09/17 08/16/17  NA 136   < > 139 136* 137  K 4.0   < > 3.4 3.3* 3.7  CL 100*  --   --   --   --   CO2 26  --   --   --   --   GLUCOSE 104*  --   --   --   --   BUN 15   < > 25* 29* 32*  CREATININE 1.01*   < > 1.0 1.0 1.1  CALCIUM 9.4  --   --   --   --    < > = values in this interval not displayed.   Recent Labs    04/26/17 07/12/17  AST 15 22  ALT 8 12  ALKPHOS 34 39   Recent Labs    01/26/17 1317 04/26/17 05/03/17 07/12/17  WBC 7.5 9.1 8.0 6.7  NEUTROABS 4.8  --   --   --   HGB 13.0 10.6* 11.2* 13.4  HCT 39.3 31* 32* 40  MCV 90.1  --   --   --   PLT 218 209 254 223   Lab Results  Component Value Date   TSH 0.90 07/12/2017   No results found for: HGBA1C No results found for: CHOL, HDL, LDLCALC, LDLDIRECT, TRIG, CHOLHDL  Significant Diagnostic Results in last 30 days:  No results found.  Assessment/Plan: Confusion  increased confusion as to the time of the day, becoming mor combative with staff when trying to reorient. Reported she got up 3 am and wants to eat breakfast 10/08/17. 10/14/17 going back to the dinning room after just eating and claiming she was never fed. She waking up 10pm, fully dressed for the day and wondering where everybody else is and if its time for breakfast. Staff noted this steady progression over the past a few months. She is irritated when she is redirected.  Will update CBC CMP sooner from scheduled 10/30/17 to 10/18/17. Obtain MMSE. Change Gabapentin to daily prn. Observe.   Neuropathy The patient has history of neuropathy, she denied pain, tingling, numbness, or paresthesia, reduced  Gabapentin 100mg  qdbid in Oct 2018, then 100mg  q 6pm from qam since 10/09/17. The patient still has early night awake and sometime sleepy during day. Change Gabapentin 100mg  daily prn/6pm daily. Observe.     Family/  staff Communication: plan of care reviewed with the patient and charge nurse.   Labs/tests ordered: reschedule CBC CMP 10/18/17 from originally 10/30/17 due to increased confusion. MMSE  Time spend 25 minutes.

## 2017-10-16 NOTE — Progress Notes (Signed)
Location:  [facility] Room Number: 806 Place of Service:  ALF [phone]) Provider:  Mast, Manxie  NP  Oneal GroutPandey, Mahima, MD  Patient Care Team: Oneal GroutPandey, Mahima, MD as PCP - General (Internal Medicine)  Extended Emergency Contact Information Primary Emergency Contact: Morgan,Tom Address: [address]          [place], [place] [address] [place] Home Phone: [phone] Relation: Friend Secondary Emergency Contact: Fletcher,Christa  [place] Home Phone: [phone] Relation: Niece  Code Status:  Full Code Goals of care: Advanced Directive information Advanced Directives 10/05/2017  Does Patient Have a Medical Advance Directive? No  Does patient want to make changes to medical advance directive? No - Patient declined  Would patient like information on creating a medical advance directive? -     Chief Complaint  Patient presents with  . Acute Visit    Increased confusion, combative with staff when redirected.    HPI:  Pt is a 84 y.o. female seen today for an acute visit for    Past Medical History:  Diagnosis Date  . Anticoagulant long-term use   . Atrial fibrillation (HCC)   . CHF (congestive heart failure) (HCC)   . Chronic anticoagulation   . Hypertension   . Raynaud phenomenon   . Tremor, essential    Past Surgical History:  Procedure Laterality Date  . CHOLECYSTECTOMY    . TONSILLECTOMY      Allergies  Allergen Reactions  . Fosamax [Alendronate]   . Other Nausea And Vomiting    Green pepper    Outpatient Encounter Medications as of 10/16/2017  Medication Sig  . calcium carbonate (OSCAL) 1500 (600 Ca) MG TABS tablet Take 600 mg of elemental calcium by mouth 2 (two) times daily with a meal.  . chlorthalidone (HYGROTON) 25 MG tablet Take 25 mg by mouth daily.   Marland Kitchen. gabapentin (NEURONTIN) 100 MG capsule Take 100 mg by mouth daily.   . potassium chloride (K-DUR) 10 MEQ tablet Take 1 tablet (10 mEq total) by  mouth daily.  . propranolol ER (INDERAL LA) 80 MG 24 hr capsule TAKE 1 CAPSULE BY MOUTH  DAILY  . UNABLE TO FIND Med Name: Vicks vapor rub. Apply under nostrils 2 times daily as needed.  . warfarin (COUMADIN) 3 MG tablet Take 3 mg by mouth daily at 6 PM. Take Mon- Fri.   No facility-administered encounter medications on file as of 10/16/2017.     Review of Systems  Immunization History  Administered Date(s) Administered  . Influenza-Unspecified 05/28/2017  . Pneumococcal Conjugate-13 07/13/2017  . Tdap 07/13/2017   Pertinent  Health Maintenance Due  Topic Date Due  . PNA vac Low Risk Adult (2 of 2 - PPSV23) 07/13/2018  . INFLUENZA VACCINE  Completed  . DEXA SCAN  Completed   Fall Risk  10/05/2017  Falls in the past year? No   Functional Status Survey:    Vitals:   10/16/17 1026  BP: 120/65  Pulse: 70  Resp: 18  Temp: 98 F (36.7 C)  SpO2: 97%  Weight: 130 lb (59 kg)   Body mass index is 22.31 kg/m. Physical Exam  Labs reviewed: Recent Labs    01/26/17 1317  07/12/17 08/09/17 08/16/17  NA 136   < > 139 136* 137  K 4.0   < > 3.4 3.3* 3.7  CL 100*  --   --   --   --   CO2 26  --   --   --   --  GLUCOSE 104*  --   --   --   --   BUN 15   < > 25* 29* 32*  CREATININE 1.01*   < > 1.0 1.0 1.1  CALCIUM 9.4  --   --   --   --    < > = values in this interval not displayed.   Recent Labs    04/26/17 07/12/17  AST 15 22  ALT 8 12  ALKPHOS 34 39   Recent Labs    01/26/17 1317 04/26/17 05/03/17 07/12/17  WBC 7.5 9.1 8.0 6.7  NEUTROABS 4.8  --   --   --   HGB 13.0 10.6* 11.2* 13.4  HCT 39.3 31* 32* 40  MCV 90.1  --   --   --   PLT 218 209 254 223   Lab Results  Component Value Date   TSH 0.90 07/12/2017   No results found for: HGBA1C No results found for: CHOL, HDL, LDLCALC, LDLDIRECT, TRIG, CHOLHDL  Significant Diagnostic Results in last 30 days:  No results found.  Assessment/Plan There are no diagnoses linked to this encounter.   Family/ staff  Communication:   Labs/tests ordered:

## 2017-10-18 ENCOUNTER — Other Ambulatory Visit: Payer: Self-pay | Admitting: *Deleted

## 2017-10-18 LAB — BASIC METABOLIC PANEL
BUN: 30 — AB (ref 4–21)
CREATININE: 1.2 — AB (ref <=1.1)
GLUCOSE: 84
POTASSIUM: 3.6 (ref 3.4–5.3)
Sodium: 142 (ref 137–147)

## 2017-10-18 LAB — CBC AND DIFFERENTIAL
HCT: 36 (ref 36–46)
Hemoglobin: 12.6 (ref 12.0–16.0)
Platelets: 182 (ref 150–399)
WBC: 8

## 2017-10-18 LAB — HEPATIC FUNCTION PANEL
ALT: 10 (ref 7–35)
AST: 17 (ref 13–35)
Alkaline Phosphatase: 31 (ref 25–125)
Bilirubin, Total: 0.6

## 2017-10-22 ENCOUNTER — Encounter: Payer: Self-pay | Admitting: Nurse Practitioner

## 2017-10-22 ENCOUNTER — Non-Acute Institutional Stay: Payer: Medicare Other | Admitting: Nurse Practitioner

## 2017-10-22 DIAGNOSIS — H04123 Dry eye syndrome of bilateral lacrimal glands: Secondary | ICD-10-CM

## 2017-10-22 DIAGNOSIS — I10 Essential (primary) hypertension: Secondary | ICD-10-CM

## 2017-10-22 NOTE — Assessment & Plan Note (Signed)
The patient takes Chlorthalidone 25mg  daily for blood pressure.

## 2017-10-22 NOTE — Assessment & Plan Note (Addendum)
c/o dry eyes, rubbing eyes, slightly redness reported, the patient stated it feels so dry when she blinks her eyes. Denied eye pain, itching, or change of vision. Dry eye vs photo senitivity as one of the possible side effects of Chlorthalidone. Will try artificial tears 1gtt tid OU. Observe.

## 2017-10-22 NOTE — Progress Notes (Signed)
Location:  [facility] Room Number: 806 Place of Service:  ALF [phone]) Provider:  Richad Ramsay, Manxie  NP  Oneal Grout, MD  Patient Care Team: Oneal Grout, MD as PCP - General (Internal Medicine)  Extended Emergency Contact Information Primary Emergency Contact: Morgan,Tom Address: [address]          [place], [place] [address] [place] Home Phone: [phone] Relation: Friend Secondary Emergency Contact: Brandy [place] Home Phone: [phone] Relation: Niece  Code Status:  DNR Goals of care: Advanced Directive information Advanced Directives 10/05/2017  Does Patient Have a Medical Advance Directive? No  Does patient want to make changes to medical advance directive? No - Patient declined  Would patient like information on creating a medical advance directive? -     Chief Complaint  Patient presents with  . Acute Visit    C/O dry eyes and wants something for this issue.    HPI:  Pt is a 84 y.o. female seen today for an acute visit for c/o dry eyes, rubbing eyes, slightly redness reported, the patient stated it feels so dry when she blinks her eyes. Denied eye pain, itching, or change of vision. The patient takes Chlorthalidone 25mg  daily for blood pressure.    Past Medical History:  Diagnosis Date  . Anticoagulant long-term use   . Atrial fibrillation (HCC)   . CHF (congestive heart failure) (HCC)   . Chronic anticoagulation   . Hypertension   . Raynaud phenomenon   . Tremor, essential    Past Surgical History:  Procedure Laterality Date  . CHOLECYSTECTOMY    . TONSILLECTOMY      Allergies  Allergen Reactions  . Fosamax [Alendronate]   . Other Nausea And Vomiting    Green pepper    Outpatient Encounter Medications as of 10/22/2017  Medication Sig  . calcium carbonate (OSCAL) 1500 (600 Ca) MG TABS tablet Take 600 mg of elemental calcium by mouth 2 (two) times daily with a meal.  .  chlorthalidone (HYGROTON) 25 MG tablet Take 25 mg by mouth daily.   Marland Kitchen gabapentin (NEURONTIN) 100 MG capsule Take 100 mg by mouth daily.   . potassium chloride (K-DUR) 10 MEQ tablet Take 1 tablet (10 mEq total) by mouth daily.  . propranolol ER (INDERAL LA) 80 MG 24 hr capsule TAKE 1 CAPSULE BY MOUTH  DAILY  . UNABLE TO FIND Med Name: Vicks vapor rub. Apply under nostrils 2 times daily as needed.  . warfarin (COUMADIN) 3 MG tablet Take 3 mg by mouth daily at 6 PM. Take Mon- Fri.   No facility-administered encounter medications on file as of 10/22/2017.     Review of Systems  Constitutional: Negative for activity change, appetite change, chills, diaphoresis, fatigue and fever.  Eyes: Negative for photophobia, pain, discharge, redness, itching and visual disturbance.       C/o dry eyes  Cardiovascular: Positive for leg swelling. Negative for chest pain and palpitations.  Musculoskeletal: Positive for gait problem.  Neurological: Positive for tremors.  Psychiatric/Behavioral: Positive for confusion. Negative for agitation, behavioral problems and hallucinations. The patient is not nervous/anxious.     Immunization History  Administered Date(s) Administered  . Influenza-Unspecified 05/28/2017  . Pneumococcal Conjugate-13 07/13/2017  . Tdap 07/13/2017   Pertinent  Health Maintenance Due  Topic Date Due  . PNA vac Low Risk Adult (2 of 2 - PPSV23) 07/13/2018  . INFLUENZA VACCINE  Completed  . DEXA SCAN  Completed  Fall Risk  10/05/2017  Falls in the past year? No   Functional Status Survey:    Vitals:   10/22/17 1124  BP: 130/68  Pulse: 84  Resp: 20  Temp: 98.1 F (36.7 C)  SpO2: 95%  Weight: 130 lb (59 kg)  Height: 5\' 6"  (1.676 m)   Body mass index is 20.98 kg/m. Physical Exam  Constitutional: She appears well-developed and well-nourished. No distress.  HENT:  Head: Normocephalic and atraumatic.  Eyes: Conjunctivae and EOM are normal. Pupils are equal, round, and  reactive to light. Right eye exhibits no discharge. Left eye exhibits no discharge.  Musculoskeletal: She exhibits edema.  Trace edema in BLE. Ambulates with walker.   Neurological: She is alert. She exhibits normal muscle tone. Coordination normal.  Skin: Skin is warm and dry.  Psychiatric: She has a normal mood and affect. Her behavior is normal.    Labs reviewed: Recent Labs    01/26/17 1317  08/09/17 08/16/17 10/18/17  NA 136   < > 136* 137 142  K 4.0   < > 3.3* 3.7 3.6  CL 100*  --   --   --   --   CO2 26  --   --   --   --   GLUCOSE 104*  --   --   --   --   BUN 15   < > 29* 32* 30*  CREATININE 1.01*   < > 1.0 1.1 1.2*  CALCIUM 9.4  --   --   --   --    < > = values in this interval not displayed.   Recent Labs    04/26/17 07/12/17 10/18/17  AST 15 22 17   ALT 8 12 10   ALKPHOS 34 39 31   Recent Labs    01/26/17 1317  05/03/17 07/12/17 10/18/17  WBC 7.5   < > 8.0 6.7 8.0  NEUTROABS 4.8  --   --   --   --   HGB 13.0   < > 11.2* 13.4 12.6  HCT 39.3   < > 32* 40 36  MCV 90.1  --   --   --   --   PLT 218   < > 254 223 182   < > = values in this interval not displayed.   Lab Results  Component Value Date   TSH 0.90 07/12/2017   No results found for: HGBA1C No results found for: CHOL, HDL, LDLCALC, LDLDIRECT, TRIG, CHOLHDL  Significant Diagnostic Results in last 30 days:  No results found.  Assessment/Plan Dry eye syndrome of both eyes c/o dry eyes, rubbing eyes, slightly redness reported, the patient stated it feels so dry when she blinks her eyes. Denied eye pain, itching, or change of vision. Dry eye vs photo senitivity as one of the possible side effects of Chlorthalidone. Will try artificial tears 1gtt tid OU. Observe.   Hypertension The patient takes Chlorthalidone 25mg  daily for blood pressure.       Family/ staff Communication: plan of care reviewed with the patient and charge nurse.   Labs/tests ordered:  None  Time spend 25 minutes.

## 2017-10-23 DIAGNOSIS — I483 Typical atrial flutter: Secondary | ICD-10-CM | POA: Diagnosis not present

## 2017-10-23 DIAGNOSIS — Z7901 Long term (current) use of anticoagulants: Secondary | ICD-10-CM | POA: Diagnosis not present

## 2017-10-30 ENCOUNTER — Encounter: Payer: Self-pay | Admitting: Cardiovascular Disease

## 2017-10-30 DIAGNOSIS — I483 Typical atrial flutter: Secondary | ICD-10-CM | POA: Diagnosis not present

## 2017-10-30 DIAGNOSIS — N183 Chronic kidney disease, stage 3 (moderate): Secondary | ICD-10-CM | POA: Diagnosis not present

## 2017-10-30 DIAGNOSIS — E876 Hypokalemia: Secondary | ICD-10-CM | POA: Diagnosis not present

## 2017-10-30 DIAGNOSIS — Z7901 Long term (current) use of anticoagulants: Secondary | ICD-10-CM | POA: Diagnosis not present

## 2017-10-30 DIAGNOSIS — I10 Essential (primary) hypertension: Secondary | ICD-10-CM | POA: Diagnosis not present

## 2017-11-01 ENCOUNTER — Encounter: Payer: Self-pay | Admitting: Nurse Practitioner

## 2017-11-01 ENCOUNTER — Non-Acute Institutional Stay: Payer: Medicare Other | Admitting: Nurse Practitioner

## 2017-11-01 DIAGNOSIS — F0280 Dementia in other diseases classified elsewhere without behavioral disturbance: Secondary | ICD-10-CM | POA: Diagnosis not present

## 2017-11-01 DIAGNOSIS — F0150 Vascular dementia without behavioral disturbance: Secondary | ICD-10-CM

## 2017-11-01 DIAGNOSIS — G309 Alzheimer's disease, unspecified: Secondary | ICD-10-CM | POA: Diagnosis not present

## 2017-11-01 DIAGNOSIS — I480 Paroxysmal atrial fibrillation: Secondary | ICD-10-CM | POA: Diagnosis not present

## 2017-11-01 DIAGNOSIS — I10 Essential (primary) hypertension: Secondary | ICD-10-CM | POA: Diagnosis not present

## 2017-11-01 NOTE — Assessment & Plan Note (Signed)
Blood pressure is in control.  

## 2017-11-01 NOTE — Assessment & Plan Note (Signed)
reported the patient's confusion and exit seeking. Staff reported the patient's poor appetite, but the patient stated she likes and looking forward eating meals. Staff reported 10/31/16 the patient is up around 12 am, walked back and force, attempted to opern doors to go outside, she stated I have an appointment and they are waiting for me outside. 10/18/17 CBC CMP were unremarkable. MMSE 10/17/17 20/30 Risk, benefit, and alternatives discussed with the patient and charge nurse, prefers watchful waiting given advanced age of 84, may needs memory care unit admission, close supervision for safety. Observe.

## 2017-11-01 NOTE — Assessment & Plan Note (Signed)
Heart rate is in control, continue Coumadin for thromboembolic risk reduction.  

## 2017-11-01 NOTE — Progress Notes (Signed)
Location:  [facility] Room Number: 806 Place of Service:  ALF [phone]) Provider:  Alesha Jaffee, Manxie  NP  Oneal Grout, MD  Patient Care Team: Oneal Grout, MD as PCP - General (Internal Medicine)  Extended Emergency Contact Information Primary Emergency Contact: Morgan,Tom Address: [address]          [place], [place] [address] [place] Home Phone: [phone] Relation: Friend Secondary Emergency Contact: Fletcher,Christa  [place] Home Phone: [phone] Relation: Niece  Code Status:  Full Code Goals of care: Advanced Directive information Advanced Directives 10/05/2017  Does Patient Have a Medical Advance Directive? No  Does patient want to make changes to medical advance directive? No - Patient declined  Would patient like information on creating a medical advance directive? -     Chief Complaint  Patient presents with  . Acute Visit    HPI:  Pt is a 84 y.o. female seen today for an acute visit for reported the patient's confusion and exit seeking. Staff reported the patient's poor appetite, but the patient stated she likes and looking forward eating meals. Staff reported 10/31/16 the patient is up around 12 am, walked back and force, attempted to opern doors to go outside, she stated I have an appointment and they are waiting for me outside. 10/18/17 CBC CMP were unremarkable. MMSE 10/17/17 20/30   Hx of Afib, heart rate is in control, on Coumadin for thromboembolic risk reduction. HTN, blood pressure is in control.  Past Medical History:  Diagnosis Date  . Anticoagulant long-term use   . Atrial fibrillation (HCC)   . CHF (congestive heart failure) (HCC)   . Chronic anticoagulation   . Hypertension   . Raynaud phenomenon   . Tremor, essential    Past Surgical History:  Procedure Laterality Date  . CHOLECYSTECTOMY    . TONSILLECTOMY      Allergies  Allergen Reactions  . Fosamax [Alendronate]   . Other  Nausea And Vomiting    Green pepper    Outpatient Encounter Medications as of 11/01/2017  Medication Sig  . calcium carbonate (OSCAL) 1500 (600 Ca) MG TABS tablet Take 600 mg of elemental calcium by mouth 2 (two) times daily with a meal.  . chlorthalidone (HYGROTON) 25 MG tablet Take 25 mg by mouth daily.   Marland Kitchen gabapentin (NEURONTIN) 100 MG capsule Take 100 mg by mouth daily.   . polyvinyl alcohol (LIQUIFILM TEARS) 1.4 % ophthalmic solution Place 1 drop into both eyes 3 (three) times daily.  . potassium chloride (K-DUR) 10 MEQ tablet Take 1 tablet (10 mEq total) by mouth daily.  . propranolol ER (INDERAL LA) 80 MG 24 hr capsule TAKE 1 CAPSULE BY MOUTH  DAILY  . UNABLE TO FIND Med Name: Vicks vapor rub. Apply under nostrils 2 times daily as needed.  . warfarin (COUMADIN) 3 MG tablet Take 3 mg by mouth daily at 6 PM. Take Mon- Fri.   No facility-administered encounter medications on file as of 11/01/2017.    ROS was provided with assistance of staff Review of Systems  Constitutional: Negative for activity change, appetite change, chills, diaphoresis, fatigue and fever.  HENT: Positive for hearing loss. Negative for congestion.   Respiratory: Negative for cough, chest tightness, shortness of breath and wheezing.   Cardiovascular: Positive for leg swelling. Negative for chest pain and palpitations.  Gastrointestinal: Negative for abdominal distention and abdominal pain.  Musculoskeletal: Positive for gait problem.  Skin: Negative for color change  and pallor.  Neurological: Positive for tremors. Negative for dizziness, speech difficulty and headaches.       Memory recall difficulties. Mild tremors in fingers.   Psychiatric/Behavioral: Positive for confusion and sleep disturbance. Negative for agitation, behavioral problems and hallucinations.       She stated she sleeps well at night, but staff reported early am awake    Immunization History  Administered Date(s) Administered  .  Influenza-Unspecified 05/28/2017  . Pneumococcal Conjugate-13 07/13/2017  . Tdap 07/13/2017   Pertinent  Health Maintenance Due  Topic Date Due  . PNA vac Low Risk Adult (2 of 2 - PPSV23) 07/13/2018  . INFLUENZA VACCINE  Completed  . DEXA SCAN  Completed   Fall Risk  10/05/2017  Falls in the past year? No   Functional Status Survey:    Vitals:   11/01/17 1152  BP: 130/70  Pulse: 89  Resp: 20  Temp: 98.5 F (36.9 C)  SpO2: 96%  Weight: 120 lb (54.4 kg)  Height: 5\' 6"  (1.676 m)   Body mass index is 19.37 kg/m. Physical Exam  Constitutional: She appears well-developed and well-nourished.  HENT:  Head: Normocephalic and atraumatic.  Eyes: Pupils are equal, round, and reactive to light. Conjunctivae and EOM are normal.  Neck: Normal range of motion. Neck supple. No JVD present. No thyromegaly present.  Cardiovascular: Normal rate.  Murmur heard. Heart murmur upper left sternal border. Irregular heart beats.   Pulmonary/Chest: Effort normal and breath sounds normal. She has no wheezes. She has no rales.  Abdominal: Soft. Bowel sounds are normal.  Musculoskeletal: She exhibits edema.  Ambulates with walker, trace edema in ankles.   Neurological: She is alert. She exhibits normal muscle tone. Coordination normal.  Oriented to person and her room on unit.   Skin: Skin is warm and dry.  Psychiatric: She has a normal mood and affect. Her behavior is normal.    Labs reviewed: Recent Labs    01/26/17 1317  08/09/17 08/16/17 10/18/17  NA 136   < > 136* 137 142  K 4.0   < > 3.3* 3.7 3.6  CL 100*  --   --   --   --   CO2 26  --   --   --   --   GLUCOSE 104*  --   --   --   --   BUN 15   < > 29* 32* 30*  CREATININE 1.01*   < > 1.0 1.1 1.2*  CALCIUM 9.4  --   --   --   --    < > = values in this interval not displayed.   Recent Labs    04/26/17 07/12/17 10/18/17  AST 15 22 17   ALT 8 12 10   ALKPHOS 34 39 31   Recent Labs    01/26/17 1317  05/03/17 07/12/17 10/18/17    WBC 7.5   < > 8.0 6.7 8.0  NEUTROABS 4.8  --   --   --   --   HGB 13.0   < > 11.2* 13.4 12.6  HCT 39.3   < > 32* 40 36  MCV 90.1  --   --   --   --   PLT 218   < > 254 223 182   < > = values in this interval not displayed.   Lab Results  Component Value Date   TSH 0.90 07/12/2017   No results found for: HGBA1C No results found for: CHOL, HDL,  LDLCALC, LDLDIRECT, TRIG, CHOLHDL  Significant Diagnostic Results in last 30 days:  No results found.  Assessment/Plan Mixed Alzheimer's and vascular dementia reported the patient's confusion and exit seeking. Staff reported the patient's poor appetite, but the patient stated she likes and looking forward eating meals. Staff reported 10/31/16 the patient is up around 12 am, walked back and force, attempted to opern doors to go outside, she stated I have an appointment and they are waiting for me outside. 10/18/17 CBC CMP were unremarkable. MMSE 10/17/17 20/30 Risk, benefit, and alternatives discussed with the patient and charge nurse, prefers watchful waiting given advanced age of 36, may needs memory care unit admission, close supervision for safety. Observe.   Paroxysmal atrial fibrillation (HCC) Heart rate is in control, continue Coumadin for thromboembolic risk reduction.   Hypertension Blood pressure is in control.      Family/ staff Communication: plan of care reviewed with the patient and charge nurse.   Labs/tests ordered:  None  Time spend 25 minutes.

## 2017-11-06 DIAGNOSIS — I4891 Unspecified atrial fibrillation: Secondary | ICD-10-CM | POA: Diagnosis not present

## 2017-11-06 DIAGNOSIS — I5021 Acute systolic (congestive) heart failure: Secondary | ICD-10-CM | POA: Diagnosis not present

## 2017-11-06 DIAGNOSIS — I10 Essential (primary) hypertension: Secondary | ICD-10-CM | POA: Diagnosis not present

## 2017-11-06 LAB — POCT INR: INR: 2.2 — AB (ref <=1.1)

## 2017-11-06 LAB — PROTIME-INR: Protime: 23.3 — AB (ref 10.0–13.8)

## 2017-11-07 ENCOUNTER — Other Ambulatory Visit: Payer: Self-pay | Admitting: *Deleted

## 2017-11-09 ENCOUNTER — Ambulatory Visit (INDEPENDENT_AMBULATORY_CARE_PROVIDER_SITE_OTHER): Payer: Medicare Other | Admitting: Cardiovascular Disease

## 2017-11-09 ENCOUNTER — Encounter: Payer: Self-pay | Admitting: Cardiovascular Disease

## 2017-11-09 VITALS — BP 122/84 | HR 72 | Wt 129.4 lb

## 2017-11-09 DIAGNOSIS — I482 Chronic atrial fibrillation, unspecified: Secondary | ICD-10-CM

## 2017-11-09 DIAGNOSIS — I4821 Permanent atrial fibrillation: Secondary | ICD-10-CM

## 2017-11-09 NOTE — Progress Notes (Signed)
Cardiology Office Note   Date:  11/09/2017   ID:  Angelica Mejia, DOB 1918-08-23, MRN [id number]  PCP:  Oneal Grout, MD  Cardiologist:   Kristeen Miss, MD   Chief Complaint  Patient presents with  . Atrial Fibrillation   1. Atrial fibrillation 2. Essential tremor 3. Chronic anticoagulation 4. Hypertension 5. Raynaud's phenomenon   Pt is doing well. No complaints. She lives at [facility] She has noticed that her HR is very low. She has not had as much energy. She denies any syncope.   October 17, 2012:  She is doing well. She had an episode of more severe palpitations. The episode lasted for 3 hours and resolved overnight.   Sept. 12., 2014:  Angelica Mejia is doing well. She is able to do her normal activities. She goes to see her husband at [facility]. She is at [facility].  Asymptomatic from an A fib standpoint.   October 22, 2013:  Angelica Mejia is doing ok. She is having to take care of her husband who has dementia. No CP , no dyspnea,  No syncope.   Sept. 17, 2015:  Angelica Mejia is doing ok. No Cp or dyspnea.  Has a problem with her renal function and her medical doctor has decreased her Lisinopril to 5 mg ( despite the list saying 20 mg below)     Angelica Mejia is a 84 y.o. female who presents for follow up of her atrial fib and HTN.  She is doing ok Commented that she has occasional small amount of blood in her stool.  Has not had a CBC drawn here in 2 years.   Oct. 4, 2016:   Angelica Mejia is seen back today for follow up of her atrial fib and HTN. Has mild bruising . No CP or dyspnea.  Has plantar fasciatis.  November 08, 2015: Angelica Mejia is seen back today for follow up visit . Has just gotten over a virus .   Has had more shortness of breath and has had palpitations . Seems to be getting better.   Still has fatigue .   Oct. 24, 2017: Seen today as a work in visit.  Has chronic AF . Has some dyspnea Is sleepy.   Is sleeping well at night   Her tremor has been worse.   Dr. Eloise Harman stopped the maxzide last week.    06/27/2016: Angelica Mejia is seen today for follow-up of her blood pressure. We started one half of her Maxide tablet several weeks ago. She seems to be feeling better. She still does not have much energy. She denies any syncope or presyncope. She denies any light headedness.  Feb. 26, 2018:  Doing well from a cardiac standpoint Had a vertebral fx and had kyphoplasty last week  - her back feels better.  Her Propranolol dose was decreased from 120 CD to 80 mg CD .   She can tell any difference between the 2 doses.   Sept. 28 , 2018:   Doing well Angelica Mejia 2 weeks ago and broke her shoulder ,  Feels ok now  Not as strong as she used to be  No CP , breathing is ok   November 09, 2017 Angelica Mejia is seen today for follow-up of her atrial fibrillation.  She is now 83 years old.  She was brought in by her son.  No CP or dyspnea. Still able to walk down to get lunch and dinner .   No syncope   She asked why she  was here in the office today .   Past Medical History:  Diagnosis Date  . Anticoagulant long-term use   . Atrial fibrillation (HCC)   . CHF (congestive heart failure) (HCC)   . Chronic anticoagulation   . Hypertension   . Raynaud phenomenon   . Tremor, essential     Past Surgical History:  Procedure Laterality Date  . CHOLECYSTECTOMY    . TONSILLECTOMY       Current Outpatient Medications  Medication Sig Dispense Refill  . calcium carbonate (OSCAL) 1500 (600 Ca) MG TABS tablet Take 600 mg of elemental calcium by mouth 2 (two) times daily with a meal.    . chlorthalidone (HYGROTON) 25 MG tablet Take 25 mg by mouth daily.     Marland Kitchen gabapentin (NEURONTIN) 100 MG capsule Take 100 mg by mouth daily.     . polyvinyl alcohol (LIQUIFILM TEARS) 1.4 % ophthalmic solution Place 1 drop into both eyes 3 (three) times daily.    . potassium chloride (K-DUR) 10 MEQ tablet Take 1 tablet (10 mEq total) by mouth daily.    . propranolol  ER (INDERAL LA) 80 MG 24 hr capsule TAKE 1 CAPSULE BY MOUTH  DAILY 90 capsule 1  . UNABLE TO FIND Med Name: Vicks vapor rub. Apply under nostrils 2 times daily as needed.    . warfarin (COUMADIN) 3 MG tablet Take 3 mg by mouth daily at 6 PM. Take Mon- Fri.     No current facility-administered medications for this visit.     Allergies:   Fosamax [alendronate] and Other    Social History:  The patient  reports that she has never smoked. She has never used smokeless tobacco. She reports that she does not drink alcohol or use drugs.   Family History:  The patient's family history includes Cancer in her unknown relative; Heart attack in her father; Heart disease in her unknown relative; Hypertension in her father and unknown relative; Stroke in her unknown relative.    ROS:   Noted in current history, otherwise review of systems is negative.   Physical Exam: There were no vitals taken for this visit.  GEN:  Elderly , frail female,   Walks with assistance of a walker  HEENT: Normal NECK: No JVD; No carotid bruits LYMPHATICS: No lymphadenopathy CARDIAC: Irreg. Irreg.  No significnat murmurs RESPIRATORY:  Clear to auscultation without rales, wheezing or rhonchi  ABDOMEN: Soft, non-tender, non-distended MUSCULOSKELETAL:  No edema; No deformity  SKIN: Warm and dry NEUROLOGIC:  Alert and oriented x 3    EKG:    Atrial fib at HR of 72.   TWI in the ant. Lateral leads.  The ant. Lat TWI have progressed since previous   Recent Labs: 01/26/2017: B Natriuretic Peptide 303.2 07/12/2017: TSH 0.90 10/18/2017: ALT 10; BUN 30; Creatinine 1.2; Hemoglobin 12.6; Platelets 182; Potassium 3.6; Sodium 142   Lipid Panel No results found for: CHOL, TRIG, HDL, CHOLHDL, VLDL, LDLCALC, LDLDIRECT    Wt Readings from Last 3 Encounters:  11/01/17 120 lb (54.4 kg)  10/22/17 130 lb (59 kg)  10/16/17 130 lb (59 kg)    ECG:  Other studies Reviewed: Additional studies/ records that were reviewed today  include: . Review of the above records demonstrates:    ASSESSMENT AND PLAN:  1. Atrial fibrillation -  .  She has chronic atrial fibrillation.  Continue Coumadin. She has new worsened T wave inversions in her anterolateral leads.  At this point we do not plan on  doing any further evaluation for this.  She has at least mild to moderate dementia and is in fairly frail health.  We would only consider doing anything if she were to have significant chest pain and wished to have a procedure.  Otherwise I would recommend medical therapy.  2. Essential tremor  3. Chronic anticoagulation -   Continue coumadin , managed by her primary   4. Hypertension -    BP is well controlled  5. Raynaud's phenomenon   Current medicines are reviewed at length with the patient today.  The patient does not have concerns regarding medicines.  The following changes have been made:  See above.   Labs/ tests ordered today include:   Will see her again as needed.   Have asked her son to call us if she develops any problems   Signed, Kristeen MissPhilip Ante Arredondo, MD  11/09/2017 10:44 AM    [facility] [address], [place], [place]  [address] Phone: [phone](336) [phone]; Fax: [phone]

## 2017-11-09 NOTE — Patient Instructions (Signed)
Medication Instructions:  Your physician recommends that you continue on your current medications as directed. Please refer to the Current Medication list given to you today.   Labwork: None Ordered   Testing/Procedures: None Ordered   Follow-Up: Your physician recommends that you schedule a follow-up appointment in: as needed with Dr. Nahser   If you need a refill on your cardiac medications before your next appointment, please call your pharmacy.   Thank you for choosing [facility]! Ladene Allocca, RN [phone]    

## 2017-11-12 ENCOUNTER — Ambulatory Visit (INDEPENDENT_AMBULATORY_CARE_PROVIDER_SITE_OTHER): Payer: Medicare Other | Admitting: Podiatry

## 2017-11-12 ENCOUNTER — Encounter: Payer: Self-pay | Admitting: Podiatry

## 2017-11-12 DIAGNOSIS — L03032 Cellulitis of left toe: Secondary | ICD-10-CM | POA: Diagnosis not present

## 2017-11-12 DIAGNOSIS — L97529 Non-pressure chronic ulcer of other part of left foot with unspecified severity: Secondary | ICD-10-CM | POA: Diagnosis not present

## 2017-11-12 DIAGNOSIS — I83025 Varicose veins of left lower extremity with ulcer other part of foot: Secondary | ICD-10-CM

## 2017-11-12 DIAGNOSIS — L97522 Non-pressure chronic ulcer of other part of left foot with fat layer exposed: Secondary | ICD-10-CM

## 2017-11-12 MED ORDER — DOXYCYCLINE HYCLATE 100 MG PO TABS: 100.0000 mg | ORAL_TABLET | Freq: Two times a day (BID) | ORAL | 0 refills | Status: DC

## 2017-11-12 MED ORDER — GENTAMICIN SULFATE 0.1 % EX CREA: 1.0000 "application " | TOPICAL_CREAM | Freq: Three times a day (TID) | CUTANEOUS | 0 refills | Status: DC

## 2017-11-13 DIAGNOSIS — N183 Chronic kidney disease, stage 3 (moderate): Secondary | ICD-10-CM | POA: Diagnosis not present

## 2017-11-13 DIAGNOSIS — I10 Essential (primary) hypertension: Secondary | ICD-10-CM | POA: Diagnosis not present

## 2017-11-13 DIAGNOSIS — E876 Hypokalemia: Secondary | ICD-10-CM | POA: Diagnosis not present

## 2017-11-13 DIAGNOSIS — I5021 Acute systolic (congestive) heart failure: Secondary | ICD-10-CM | POA: Diagnosis not present

## 2017-11-14 NOTE — Progress Notes (Signed)
   Subjective:  84 year old female presenting today with a chief complaint of an ulceration to the left fourth toe. She lives in an assisted living facility and is unsure when the lesion appeared. She has not had any treatment for the wounds. There are no modifying factors noted. Patient is here for further evaluation and treatment.    Past Medical History:  Diagnosis Date  . Anticoagulant long-term use   . Atrial fibrillation (HCC)   . CHF (congestive heart failure) (HCC)   . Chronic anticoagulation   . Hypertension   . Raynaud phenomenon   . Tremor, essential      Objective/Physical Exam General: The patient is alert and oriented x3 in no acute distress.  Dermatology:  Wound #1 noted to the left fourth toe measuring 0.4 x 0.4 x 0.1 cm (LxWxD).   To the noted ulceration(s), there is no eschar. There is a moderate amount of slough, fibrin, and necrotic tissue noted. Granulation tissue and wound base is red. There is a minimal amount of serosanguineous drainage noted. There is no exposed bone muscle-tendon ligament or joint. There is no malodor. Periwound integrity is intact. Erythema and edema localized to the left fourth toe.  Skin is warm, dry and supple bilateral lower extremities.  Vascular: Palpable pedal pulses bilaterally. Mild edema noted. Capillary refill within normal limits. Varicosities noted bilateral lower extremities.   Neurological: Epicritic and protective threshold absent bilaterally.   Musculoskeletal Exam: Range of motion within normal limits to all pedal and ankle joints bilateral. Muscle strength 5/5 in all groups bilateral.   Assessment: #1 ulceration to the left fourth toe secondary to venous insufficiency #2 varicosities bilateral lower extremities #3 cellulitis left fourth toe  Plan of Care:  #1 Patient was evaluated. #2 medically necessary excisional debridement including subcutaneous tissue was performed using a tissue nipper and a chisel blade.  Excisional debridement of all the necrotic nonviable tissue down to healthy bleeding viable tissue was performed with post-debridement measurements same as pre-. #3 the wound was cleansed with normal saline. #4 culture taken from wound. #5 Prescription for Doxycycline #20 provided to patient.  #6 Prescription for Gentamicin cream provided to patient to be used daily with a Band-Aid.  #7 Return to clinic in two weeks.    Felecia ShellingBrent M. Evans, DPM [facility]  Dr. Felecia ShellingBrent M. Evans, DPM    [address]                                        [place], [place] [address]                Office [phone]  Fax [phone]

## 2017-11-15 DIAGNOSIS — I4891 Unspecified atrial fibrillation: Secondary | ICD-10-CM | POA: Diagnosis not present

## 2017-11-15 LAB — WOUND CULTURE
MICRO NUMBER: 90429782
SPECIMEN QUALITY: ADEQUATE

## 2017-11-22 DIAGNOSIS — I5021 Acute systolic (congestive) heart failure: Secondary | ICD-10-CM | POA: Diagnosis not present

## 2017-11-22 DIAGNOSIS — Z7901 Long term (current) use of anticoagulants: Secondary | ICD-10-CM | POA: Diagnosis not present

## 2017-11-22 DIAGNOSIS — D689 Coagulation defect, unspecified: Secondary | ICD-10-CM | POA: Diagnosis not present

## 2017-11-28 ENCOUNTER — Encounter: Payer: Self-pay | Admitting: Podiatry

## 2017-11-28 ENCOUNTER — Ambulatory Visit (INDEPENDENT_AMBULATORY_CARE_PROVIDER_SITE_OTHER): Payer: Medicare Other | Admitting: Podiatry

## 2017-11-28 DIAGNOSIS — L97522 Non-pressure chronic ulcer of other part of left foot with fat layer exposed: Secondary | ICD-10-CM | POA: Diagnosis not present

## 2017-11-29 DIAGNOSIS — I5033 Acute on chronic diastolic (congestive) heart failure: Secondary | ICD-10-CM | POA: Diagnosis not present

## 2017-11-29 DIAGNOSIS — Z7901 Long term (current) use of anticoagulants: Secondary | ICD-10-CM | POA: Diagnosis not present

## 2017-11-29 LAB — POCT INR: INR: 1.8 — AB (ref <=1.1)

## 2017-11-29 LAB — PROTIME-INR: PROTIME: 19.5 — AB (ref 10.0–13.8)

## 2017-11-30 ENCOUNTER — Other Ambulatory Visit: Payer: Self-pay | Admitting: *Deleted

## 2017-12-02 NOTE — Progress Notes (Signed)
   Subjective:  84 year old female presenting today for follow up evaluation of an ulceration of the left fourth toe. She states the wound has improved. She denies any new complaints at this time. Patient is here for further evaluation and treatment.    Past Medical History:  Diagnosis Date  . Anticoagulant long-term use   . Atrial fibrillation (HCC)   . CHF (congestive heart failure) (HCC)   . Chronic anticoagulation   . Hypertension   . Raynaud phenomenon   . Tremor, essential      Objective/Physical Exam General: The patient is alert and oriented x3 in no acute distress.  Dermatology:  Wound noted to the left fourth toe has healed. Complete re-epithelialization has occurred. No drainage noted.  Skin is warm, dry and supple bilateral lower extremities.  Vascular: Palpable pedal pulses bilaterally. No edema or erythema noted. Capillary refill within normal limits. Varicosities noted bilateral lower extremities.   Neurological: Epicritic and protective threshold absent bilaterally.   Musculoskeletal Exam: Range of motion within normal limits to all pedal and ankle joints bilateral. Muscle strength 5/5 in all groups bilateral.   Assessment: #1 ulceration to the left fourth toe secondary to venous insufficiency - healed #2 varicosities bilateral lower extremities #3 cellulitis left fourth toe - healed   Plan of Care:  #1 Patient was evaluated. #2 recommended good shoe gear.  #3 Recommended patient check feet daily.  #4 Return to clinic as needed.     Felecia Shelling, DPM [facility]  Dr. Felecia Shelling, DPM    [address]                                        [place], [place] [address]                Office [phone]  Fax [phone]

## 2017-12-04 ENCOUNTER — Non-Acute Institutional Stay: Payer: Medicare Other | Admitting: Nurse Practitioner

## 2017-12-04 ENCOUNTER — Encounter: Payer: Self-pay | Admitting: Nurse Practitioner

## 2017-12-04 DIAGNOSIS — R32 Unspecified urinary incontinence: Secondary | ICD-10-CM | POA: Insufficient documentation

## 2017-12-04 DIAGNOSIS — N3942 Incontinence without sensory awareness: Secondary | ICD-10-CM

## 2017-12-04 DIAGNOSIS — G309 Alzheimer's disease, unspecified: Secondary | ICD-10-CM | POA: Diagnosis not present

## 2017-12-04 DIAGNOSIS — F0150 Vascular dementia without behavioral disturbance: Secondary | ICD-10-CM

## 2017-12-04 DIAGNOSIS — F0280 Dementia in other diseases classified elsewhere without behavioral disturbance: Secondary | ICD-10-CM

## 2017-12-04 NOTE — Progress Notes (Signed)
Location:  [facility] Room Number: 806 Place of Service:  ALF [phone]) Provider:  Zacariah Belue, Arna Snipe  NP  Oneal Grout, MD  Patient Care Team: Oneal Grout, MD as PCP - General (Internal Medicine) Nahser, Deloris Ping, MD as PCP - Cardiology (Cardiology)  Extended Emergency Contact Information Primary Emergency Contact: Morgan,Tom Address: [address]          [place], [place] [address] [place] Home Phone: [phone] Relation: Friend Secondary Emergency Contact: Fletcher,Christa  [place] Home Phone: [phone] Relation: Niece  Code Status:  Full Code Goals of care: Advanced Directive information Advanced Directives 10/05/2017  Does Patient Have a Medical Advance Directive? No  Does patient want to make changes to medical advance directive? No - Patient declined  Would patient like information on creating a medical advance directive? -     Chief Complaint  Patient presents with  . Acute Visit    Incontinence and confusion    HPI:  Pt is a 84 y.o. female seen today for an acute visit for increased urinary incontinence episodes and confusion x 2 days. The resident can no longer make it to the bathroom on time and at times she doesn't even know she is wet or soiled. HPI was provided with assistance of staff due to her dementia. She denied dysuria, lower abd/back pain, urinary frequency. She is afebrile.    Past Medical History:  Diagnosis Date  . Anticoagulant long-term use   . Atrial fibrillation (HCC)   . CHF (congestive heart failure) (HCC)   . Chronic anticoagulation   . Hypertension   . Raynaud phenomenon   . Tremor, essential    Past Surgical History:  Procedure Laterality Date  . CHOLECYSTECTOMY    . TONSILLECTOMY      Allergies  Allergen Reactions  . Fosamax [Alendronate]   . Other Nausea And Vomiting    Green pepper    Outpatient Encounter Medications as of 12/04/2017  Medication Sig  .  calcium carbonate (OSCAL) 1500 (600 Ca) MG TABS tablet Take 600 mg of elemental calcium by mouth 2 (two) times daily with a meal.  . chlorthalidone (HYGROTON) 25 MG tablet Take 25 mg by mouth daily.   Marland Kitchen gabapentin (NEURONTIN) 100 MG capsule Take 100 mg by mouth daily.   Marland Kitchen gentamicin cream (GARAMYCIN) 0.1 % Apply 1 application topically 3 (three) times daily.  . polyvinyl alcohol (LIQUIFILM TEARS) 1.4 % ophthalmic solution Place 1 drop into both eyes 3 (three) times daily.  . potassium chloride (K-DUR) 10 MEQ tablet Take 1 tablet (10 mEq total) by mouth daily.  . propranolol ER (INDERAL LA) 80 MG 24 hr capsule TAKE 1 CAPSULE BY MOUTH  DAILY  . UNABLE TO FIND Med Name: Vicks vapor rub. Apply under nostrils 2 times daily as needed.  . warfarin (COUMADIN) 3 MG tablet Take 3 mg by mouth daily at 6 PM. Take Mon, Tues, Wed, Fri.  . warfarin (COUMADIN) 4 MG tablet Take 4 mg by mouth daily. Take on Sun, Thurs, Sat  . [DISCONTINUED] doxycycline (VIBRA-TABS) 100 MG tablet Take 1 tablet (100 mg total) by mouth 2 (two) times daily.   No facility-administered encounter medications on file as of 12/04/2017.    ROS was provided with assistance of staff Review of Systems  Constitutional: Negative for activity change, appetite change, chills, diaphoresis, fatigue and fever.  HENT: Positive for hearing loss.   Respiratory: Negative for cough, shortness of breath and wheezing.  Cardiovascular: Positive for leg swelling. Negative for chest pain and palpitations.  Gastrointestinal: Negative for abdominal distention, abdominal pain, constipation, diarrhea, nausea and vomiting.  Genitourinary: Positive for urgency. Negative for difficulty urinating, dysuria and hematuria.       Incontinent of urine.   Musculoskeletal: Positive for gait problem.  Skin: Negative for color change and pallor.  Neurological: Negative for speech difficulty, weakness and headaches.       Dementia  Psychiatric/Behavioral: Positive for  confusion. Negative for agitation, behavioral problems, hallucinations and sleep disturbance. The patient is not nervous/anxious.     Immunization History  Administered Date(s) Administered  . Influenza-Unspecified 05/28/2017  . Pneumococcal Conjugate-13 07/13/2017  . Tdap 07/13/2017   Pertinent  Health Maintenance Due  Topic Date Due  . INFLUENZA VACCINE  03/07/2018  . PNA vac Low Risk Adult (2 of 2 - PPSV23) 07/13/2018  . DEXA SCAN  Completed   Fall Risk  10/05/2017  Falls in the past year? No   Functional Status Survey:    Vitals:   12/04/17 0920  BP: 120/60  Pulse: 78  Resp: 18  Temp: (!) 97 F (36.1 C)  Weight: 130 lb (59 kg)  Height:  (1.676 m)   Body mass index is 20.98 kg/m. Physical Exam  Constitutional: She appears well-developed and well-nourished. No distress.  Eyes: Pupils are equal, round, and reactive to light. EOM are normal.  Neck: Normal range of motion. Neck supple. No thyromegaly present.  Cardiovascular: Normal rate.  Murmur heard. Irregular heart beats.   Pulmonary/Chest: Effort normal. She has no wheezes. She has no rales.  Abdominal: Soft. Bowel sounds are normal. She exhibits no distension. There is no tenderness. There is no rebound and no guarding.  Musculoskeletal: She exhibits edema.  Left shoulder has limited overhead ROM. Ambulates with walker. Trace edema in ankles.   Neurological: She is alert. She exhibits normal muscle tone. Coordination normal.  Oriented to person and place.   Skin: Skin is warm and dry. She is not diaphoretic.  Psychiatric: She has a normal mood and affect. Her behavior is normal.    Labs reviewed: Recent Labs    01/26/17 1317  08/09/17 08/16/17 10/18/17  NA 136   < > 136* 137 142  K 4.0   < > 3.3* 3.7 3.6  CL 100*  --   --   --   --   CO2 26  --   --   --   --   GLUCOSE 104*  --   --   --   --   BUN 15   < > 29* 32* 30*  CREATININE 1.01*   < > 1.0 1.1 1.2*  CALCIUM 9.4  --   --   --   --    < > =  values in this interval not displayed.   Recent Labs    04/26/17 07/12/17 10/18/17  AST ALT ALKPHOS 34 39 31   Recent Labs    01/26/17 1317  05/03/17 07/12/17 10/18/17  WBC 7.5   < > 8.0 6.7 8.0  NEUTROABS 4.8  --   --   --   --   HGB 13.0   < > 11.2* 13.4 12.6  HCT 39.3   < > 32* 40 36  MCV 90.1  --   --   --   --   PLT 218   < > 254 223 182   < > =  values in this interval not displayed.   Lab Results  Component Value Date   TSH 0.90 07/12/2017   No results found for: HGBA1C No results found for: CHOL, HDL, LDLCALC, LDLDIRECT, TRIG, CHOLHDL  Significant Diagnostic Results in last 30 days:  No results found.  Assessment/Plan Urinary incontinence  increased urinary incontinence episodes and confusion x 2 days. The resident can no longer make it to the bathroom on time and at times she doesn't even know she is wet or soiled.She denied dysuria, lower abd/back pain, urinary frequency. She is afebrile. Will obtain CBC/diff   Mixed Alzheimer's and vascular dementia  HPI was provided with assistance of staff due to her dementia. Continue to reside in [facility], close supervision for safety.      Family/ staff Communication: plan of care reviewed with the patient and charge nurse.   Labs/tests ordered:  CBC/diff  Time spend 25 minutes.

## 2017-12-04 NOTE — Assessment & Plan Note (Signed)
increased urinary incontinence episodes and confusion x 2 days. The resident can no longer make it to the bathroom on time and at times she doesn't even know she is wet or soiled.She denied dysuria, lower abd/back pain, urinary frequency. She is afebrile. Will obtain CBC/diff

## 2017-12-04 NOTE — Assessment & Plan Note (Signed)
HPI was provided with assistance of staff due to her dementia. Continue to reside in [facility], close supervision for safety.

## 2017-12-05 DIAGNOSIS — N3946 Mixed incontinence: Secondary | ICD-10-CM | POA: Diagnosis not present

## 2017-12-05 DIAGNOSIS — R32 Unspecified urinary incontinence: Secondary | ICD-10-CM | POA: Diagnosis not present

## 2017-12-05 LAB — CBC AND DIFFERENTIAL
HCT: 36 (ref 36–46)
Hemoglobin: 12.4 (ref 12.0–16.0)
Neutrophils Absolute: 4928
Platelets: 188 (ref 150–399)
WBC: 8.8

## 2017-12-06 ENCOUNTER — Other Ambulatory Visit: Payer: Self-pay | Admitting: *Deleted

## 2017-12-06 DIAGNOSIS — I5031 Acute diastolic (congestive) heart failure: Secondary | ICD-10-CM | POA: Diagnosis not present

## 2017-12-06 DIAGNOSIS — Z79899 Other long term (current) drug therapy: Secondary | ICD-10-CM | POA: Diagnosis not present

## 2017-12-11 ENCOUNTER — Emergency Department (HOSPITAL_COMMUNITY): Payer: Medicare Other

## 2017-12-11 ENCOUNTER — Emergency Department (HOSPITAL_COMMUNITY)
Admission: EM | Admit: 2017-12-11 | Discharge: 2017-12-11 | Disposition: A | Discharge status: Home / Self Care | Payer: Medicare Other | Attending: Emergency Medicine | Admitting: Emergency Medicine

## 2017-12-11 ENCOUNTER — Encounter (HOSPITAL_COMMUNITY): Payer: Self-pay | Admitting: Emergency Medicine

## 2017-12-11 ENCOUNTER — Other Ambulatory Visit: Payer: Self-pay

## 2017-12-11 DIAGNOSIS — S0031XA Abrasion of nose, initial encounter: Secondary | ICD-10-CM | POA: Insufficient documentation

## 2017-12-11 DIAGNOSIS — I110 Hypertensive heart disease with heart failure: Secondary | ICD-10-CM | POA: Diagnosis not present

## 2017-12-11 DIAGNOSIS — Y939 Activity, unspecified: Secondary | ICD-10-CM | POA: Diagnosis not present

## 2017-12-11 DIAGNOSIS — I509 Heart failure, unspecified: Secondary | ICD-10-CM | POA: Insufficient documentation

## 2017-12-11 DIAGNOSIS — W19XXXA Unspecified fall, initial encounter: Secondary | ICD-10-CM | POA: Diagnosis not present

## 2017-12-11 DIAGNOSIS — N3000 Acute cystitis without hematuria: Secondary | ICD-10-CM | POA: Diagnosis not present

## 2017-12-11 DIAGNOSIS — Z7901 Long term (current) use of anticoagulants: Secondary | ICD-10-CM | POA: Insufficient documentation

## 2017-12-11 DIAGNOSIS — S0990XA Unspecified injury of head, initial encounter: Secondary | ICD-10-CM | POA: Diagnosis not present

## 2017-12-11 DIAGNOSIS — Y92009 Unspecified place in unspecified non-institutional (private) residence as the place of occurrence of the external cause: Secondary | ICD-10-CM | POA: Insufficient documentation

## 2017-12-11 DIAGNOSIS — R410 Disorientation, unspecified: Secondary | ICD-10-CM | POA: Diagnosis present

## 2017-12-11 DIAGNOSIS — M2550 Pain in unspecified joint: Secondary | ICD-10-CM | POA: Diagnosis not present

## 2017-12-11 DIAGNOSIS — S0091XA Abrasion of unspecified part of head, initial encounter: Secondary | ICD-10-CM | POA: Diagnosis not present

## 2017-12-11 DIAGNOSIS — Z79899 Other long term (current) drug therapy: Secondary | ICD-10-CM | POA: Diagnosis not present

## 2017-12-11 DIAGNOSIS — S0993XA Unspecified injury of face, initial encounter: Secondary | ICD-10-CM | POA: Diagnosis not present

## 2017-12-11 DIAGNOSIS — Y999 Unspecified external cause status: Secondary | ICD-10-CM | POA: Diagnosis not present

## 2017-12-11 DIAGNOSIS — Z7401 Bed confinement status: Secondary | ICD-10-CM | POA: Diagnosis not present

## 2017-12-11 DIAGNOSIS — S199XXA Unspecified injury of neck, initial encounter: Secondary | ICD-10-CM | POA: Diagnosis not present

## 2017-12-11 LAB — CBC WITH DIFFERENTIAL/PLATELET
Basophils Absolute: 0 10*3/uL (ref 0.0–0.1)
Basophils Relative: 0 %
Eosinophils Absolute: 0.1 10*3/uL (ref 0.0–0.7)
Eosinophils Relative: 1 %
HEMATOCRIT: 42 % (ref 36.0–46.0)
Hemoglobin: 14.2 g/dL (ref 12.0–15.0)
LYMPHS ABS: 1.3 10*3/uL (ref 0.7–4.0)
LYMPHS PCT: 13 %
MCH: 30.5 pg (ref 26.0–34.0)
MCHC: 33.8 g/dL (ref 30.0–36.0)
MCV: 90.3 fL (ref 78.0–100.0)
MONOS PCT: 13 %
Monocytes Absolute: 1.3 10*3/uL — ABNORMAL HIGH (ref 0.1–1.0)
NEUTROS ABS: 7.4 10*3/uL (ref 1.7–7.7)
Neutrophils Relative %: 73 %
Platelets: 247 10*3/uL (ref 150–400)
RBC: 4.65 MIL/uL (ref 3.87–5.11)
RDW: 13.7 % (ref 11.5–15.5)
WBC: 10.1 10*3/uL (ref 4.0–10.5)

## 2017-12-11 LAB — URINALYSIS, ROUTINE W REFLEX MICROSCOPIC
BACTERIA UA: NONE SEEN
Bilirubin Urine: NEGATIVE
Glucose, UA: NEGATIVE mg/dL
KETONES UR: NEGATIVE mg/dL
Nitrite: POSITIVE — AB
PH: 6 (ref 5.0–8.0)
Protein, ur: NEGATIVE mg/dL
SPECIFIC GRAVITY, URINE: 1.011 (ref 1.005–1.030)

## 2017-12-11 LAB — BASIC METABOLIC PANEL
ANION GAP: 9 (ref 5–15)
BUN: 30 mg/dL — ABNORMAL HIGH (ref 6–20)
CALCIUM: 10 mg/dL (ref 8.9–10.3)
CHLORIDE: 95 mmol/L — AB (ref 101–111)
CO2: 35 mmol/L — ABNORMAL HIGH (ref 22–32)
Creatinine, Ser: 1.23 mg/dL — ABNORMAL HIGH (ref 0.44–1.00)
GFR calc Af Amer: 42 mL/min — ABNORMAL LOW (ref >60)
GFR calc non Af Amer: 36 mL/min — ABNORMAL LOW (ref >60)
Glucose, Bld: 119 mg/dL — ABNORMAL HIGH (ref 65–99)
Potassium: 3.2 mmol/L — ABNORMAL LOW (ref 3.5–5.1)
SODIUM: 139 mmol/L (ref 135–145)

## 2017-12-11 MED ORDER — CEPHALEXIN 500 MG PO CAPS: 500.0000 mg | ORAL_CAPSULE | Freq: Three times a day (TID) | ORAL | 0 refills | Status: DC

## 2017-12-11 MED ORDER — SODIUM CHLORIDE 0.9 % IV SOLN
1.0000 g | Freq: Once | INTRAVENOUS | Status: AC
  Administered 2017-12-11: 1 g via INTRAVENOUS
  Filled 2017-12-11: qty 10

## 2017-12-11 NOTE — ED Provider Notes (Signed)
[facility] EMERGENCY DEPARTMENT Provider Note   CSN: [id number] Arrival date & time: 12/11/17  0556     History   Chief Complaint Chief Complaint  Patient presents with  . Fall    HPI Angelica Mejia is a 84 y.o. female.  HPI  This is a 84 year old female with history of atrial fibrillation, CHF, hypertension [facility].  EMS reports increased confusion over the last several days.  She has dementia at baseline.  She was noted to have abrasions to the left side of her face and across her nose.  She does take Coumadin.  Patient is technically oriented x3.  However, she has no recollection of the fall and can give me no details.  She denies pain.  She specifically denies chest pain, shortness of breath.  She denies hip pain or back pain.  Level 5 caveat for dementia.  Past Medical History:  Diagnosis Date  . Anticoagulant long-term use   . Atrial fibrillation (HCC)   . CHF (congestive heart failure) (HCC)   . Chronic anticoagulation   . Hypertension   . Raynaud phenomenon   . Tremor, essential     Patient Active Problem List   Diagnosis Date Noted  . Urinary incontinence 12/04/2017  . Dry eye syndrome of both eyes 10/22/2017  . Mixed Alzheimer's and vascular dementia 10/16/2017  . CKD (chronic kidney disease) stage 3, GFR 30-59 ml/min (HCC) 10/09/2017  . Neuropathy 10/09/2017  . Hypokalemia 10/09/2017  . Hyponatremia 07/11/2017  . Anemia, chronic disease 07/11/2017  . Clavicle fracture 05/16/2017  . Encounter for therapeutic drug monitoring 04/19/2017  . Paroxysmal atrial fibrillation (HCC)   . Tremor, essential   . Chronic anticoagulation   . Hypertension   . Raynaud phenomenon     Past Surgical History:  Procedure Laterality Date  . CHOLECYSTECTOMY    . TONSILLECTOMY       OB History   None      Home Medications    Prior to Admission medications   Medication Sig Start Date End Date  Taking? Authorizing Provider  Calcium Carbonate-Vit D-Min (CALTRATE 600+D PLUS MINERALS) 600-800 MG-UNIT TABS Take 1 tablet by mouth daily.   Yes [provider]  Camphor-Eucalyptus-Menthol (VICKS VAPORUB EX) Apply topically 2 (two) times daily as needed (for congestion).   Yes [provider]  chlorthalidone (HYGROTON) 25 MG tablet Take 25 mg by mouth daily.  05/11/17  Yes [provider]  gabapentin (NEURONTIN) 100 MG capsule Take 100 mg by mouth daily as needed (for neuropathy).    Yes [provider]  gentamicin cream (GARAMYCIN) 0.1 % Apply 1 application topically 3 (three) times daily. 11/12/17  Yes Felecia Shelling, DPM  polyvinyl alcohol (LIQUIFILM TEARS) 1.4 % ophthalmic solution Place 1 drop into both eyes 3 (three) times daily.   Yes [provider]  potassium chloride (K-DUR) 10 MEQ tablet Take 1 tablet (10 mEq total) by mouth daily. 08/10/17  Yes Mast, Man X, NP  propranolol ER (INDERAL LA) 80 MG 24 hr capsule TAKE 1 CAPSULE BY MOUTH  DAILY 01/30/17  Yes Nilda Riggs, NP  warfarin (COUMADIN) 3 MG tablet Take 3 mg by mouth See admin instructions. Take Mon, Tues, Wed, Fri.   Yes [provider]  warfarin (COUMADIN) 4 MG tablet Take 4 mg by mouth See admin instructions. Take on Sun, Thurs, Sat   Yes [provider]    Family History Family History  Problem Relation Age of Onset  . Hypertension Father   . Heart attack Father   . Heart disease Unknown   . Hypertension Unknown   . Stroke Unknown   . Cancer Unknown     Social History Social History   Tobacco Use  . Smoking status: Never Smoker  . Smokeless tobacco: Never Used  Substance Use Topics  . Alcohol use: No  . Drug use: No     Allergies   Fosamax [alendronate] and Other   Review of Systems Review of Systems  Unable to perform ROS: Dementia     Physical Exam Updated Vital Signs BP (!) 145/67   Pulse 92   Temp 98.3 F (36.8 C) (Oral)    Resp 18   Ht  (1.676 m)   Wt 59 kg (130 lb)   SpO2 94%   BMI 20.98 kg/m   Physical Exam  Constitutional: She is oriented to person, place, and time.  Elderly, nontoxic-appearing, ABCs intact  HENT:  Head: Normocephalic.  Mouth/Throat: Oropharynx is clear and moist.  Abrasions noted over the left forehead, temporal region, lower lid, abrasion over the bridge of the nose  Eyes: Pupils are equal, round, and reactive to light. EOM are normal.  Neck: Normal range of motion. Neck supple.  No midline C-spine tenderness  Cardiovascular: Normal rate, regular rhythm and normal heart sounds.  Pulmonary/Chest: Effort normal and breath sounds normal. No respiratory distress. She has no wheezes.  Abdominal: Soft. There is no tenderness.  Musculoskeletal:  Normal range of motion of the bilateral hips and knees  Neurological: She is alert and oriented to person, place, and time.  Oriented to person, place, time Follows simple commands  Skin: Skin is warm and dry.  Psychiatric: She has a normal mood and affect.  Nursing note and vitals reviewed.    ED Treatments / Results  Labs (all labs ordered are listed, but only abnormal results are displayed) Labs Reviewed  CBC WITH DIFFERENTIAL/PLATELET  BASIC METABOLIC PANEL  URINALYSIS, ROUTINE W REFLEX MICROSCOPIC  PROTIME-INR    EKG None  Radiology No results found.  Procedures Procedures (including critical care time)  Medications Ordered in ED Medications - No data to display   Initial Impression / Assessment and Plan / ED Course  I have reviewed the triage vital signs and the nursing notes.  Pertinent labs & imaging results that were available during my care of the patient were reviewed by me and considered in my medical decision making (see chart for details).     Presents following a fall.  This was unwitnessed.  No collateral information available.  She does have evidence of abrasions to the face.  She otherwise does  not have any evidence of injury.  Work-up pending including EKG to evaluate for arrhythmia, urinalysis to evaluate for UTI, basic lab work including PT/INR and CT head and neck as well as cervical spine.  All work-up is pending.  Patient signed out to Dr. Silverio Lay.  Final Clinical Impressions(s) / ED Diagnoses   Final diagnoses:  None    ED Discharge Orders    None       Antario Yasuda, Mayer Masker, MD 12/11/17 [phone]

## 2017-12-11 NOTE — Discharge Instructions (Signed)
Take keflex 500 mg three times daily for 7 days for UTI.   Keep her hydrated.   Fall precautions   See your doctor  Return to ER if you have fall, headaches, vomiting, weakness

## 2017-12-11 NOTE — ED Provider Notes (Signed)
  Physical Exam  BP (!) 124/98   Pulse 95   Temp 98.3 F (36.8 C) (Oral)   Resp 16   Ht  (1.676 m)   Wt 59 kg (130 lb)   SpO2 95%   BMI 20.98 kg/m   Physical Exam  ED Course/Procedures     Procedures  MDM  Care assumed at sign out. Patient from nursing home here with unwitnessed fall. Sign out pending labs, UA, CT head/neck/face.   11:41 AM CT showed no acute fractures or bleed. UA + UTI. Given rocephin, will dc back with keflex. Labs unremarkable.       Charlynne Pander, MD 12/11/17 [phone]

## 2017-12-11 NOTE — ED Notes (Signed)
Pt sitting on bed Pan.

## 2017-12-11 NOTE — ED Triage Notes (Signed)
Pt arriving via [facility] from [facility] for unwitnessed fall and increased confusion x3 days. Pt has abrasions on nose and left side of face.  Pt has dementia at baseline and does not remember fall.  She is currently taking Coumadin.  She presents in A-fib. VS per EMS were 180/90, Afib w/ pulse 70's.  94% RA.

## 2017-12-12 ENCOUNTER — Non-Acute Institutional Stay: Payer: Medicare Other | Admitting: Nurse Practitioner

## 2017-12-12 ENCOUNTER — Encounter: Payer: Self-pay | Admitting: Nurse Practitioner

## 2017-12-12 DIAGNOSIS — W19XXXD Unspecified fall, subsequent encounter: Secondary | ICD-10-CM | POA: Diagnosis not present

## 2017-12-12 DIAGNOSIS — F0150 Vascular dementia without behavioral disturbance: Secondary | ICD-10-CM | POA: Diagnosis not present

## 2017-12-12 DIAGNOSIS — Z9181 History of falling: Secondary | ICD-10-CM | POA: Diagnosis not present

## 2017-12-12 DIAGNOSIS — R296 Repeated falls: Secondary | ICD-10-CM | POA: Insufficient documentation

## 2017-12-12 DIAGNOSIS — R1312 Dysphagia, oropharyngeal phase: Secondary | ICD-10-CM | POA: Diagnosis not present

## 2017-12-12 DIAGNOSIS — G309 Alzheimer's disease, unspecified: Secondary | ICD-10-CM

## 2017-12-12 DIAGNOSIS — N3946 Mixed incontinence: Secondary | ICD-10-CM | POA: Diagnosis not present

## 2017-12-12 DIAGNOSIS — F0280 Dementia in other diseases classified elsewhere without behavioral disturbance: Secondary | ICD-10-CM | POA: Diagnosis not present

## 2017-12-12 DIAGNOSIS — M6281 Muscle weakness (generalized): Secondary | ICD-10-CM | POA: Diagnosis not present

## 2017-12-12 DIAGNOSIS — R2681 Unsteadiness on feet: Secondary | ICD-10-CM | POA: Diagnosis not present

## 2017-12-12 DIAGNOSIS — N390 Urinary tract infection, site not specified: Secondary | ICD-10-CM | POA: Diagnosis not present

## 2017-12-12 DIAGNOSIS — S0081XD Abrasion of other part of head, subsequent encounter: Secondary | ICD-10-CM

## 2017-12-12 DIAGNOSIS — M25552 Pain in left hip: Secondary | ICD-10-CM | POA: Diagnosis not present

## 2017-12-12 DIAGNOSIS — I480 Paroxysmal atrial fibrillation: Secondary | ICD-10-CM

## 2017-12-12 DIAGNOSIS — E876 Hypokalemia: Secondary | ICD-10-CM | POA: Diagnosis not present

## 2017-12-12 DIAGNOSIS — M25551 Pain in right hip: Secondary | ICD-10-CM | POA: Diagnosis not present

## 2017-12-12 DIAGNOSIS — W19XXXA Unspecified fall, initial encounter: Secondary | ICD-10-CM | POA: Insufficient documentation

## 2017-12-12 NOTE — Assessment & Plan Note (Addendum)
Serum K 3.2 in ED, repeat BMP, continue Kcl daily.

## 2017-12-12 NOTE — Assessment & Plan Note (Signed)
12/11/16 sustained left forehead/facial abrasion, lack of safety awareness and increased frailty contributory, close supervision needed, possible memory care unit for care.

## 2017-12-12 NOTE — Assessment & Plan Note (Signed)
fell 12/11/16, sustained right forehead/facial abrasion, ED evaluation, left forehead/face, no sign of infection, should heal.

## 2017-12-12 NOTE — Assessment & Plan Note (Signed)
Heart rate is in control, repeat PT/INR since ABT started 12/11/17.

## 2017-12-12 NOTE — Assessment & Plan Note (Signed)
ED wbc 10., neutrophils 73, UA positive nitrite, wbc >50 12/11/17, will complete Keflex. Observe.

## 2017-12-12 NOTE — Assessment & Plan Note (Signed)
Close supervision for safety, recommend memory care unit for care needs.

## 2017-12-12 NOTE — Progress Notes (Signed)
Location:  [facility] Room Number: 806 Place of Service:  [phone] [phone]) Provider:  Bentlie Catanzaro, Manxie  NP  Oneal Grout, MD  Patient Care Team: Oneal Grout, MD as PCP - General (Internal Medicine) Nahser, Deloris Ping, MD as PCP - Cardiology (Cardiology)  Extended Emergency Contact Information Primary Emergency Contact: Morgan,Tom Address: [address]          [place], [place] [address] [place] Home Phone: [phone] Relation: Friend Secondary Emergency Contact: Brandy Hale [place] Home Phone: [phone] Relation: Niece  Code Status:  DNR Goals of care: Advanced Directive information Advanced Directives 12/11/2017  Does Patient Have a Medical Advance Directive? Yes  Type of Advance Directive Out of facility DNR (pink MOST or yellow form)  Does patient want to make changes to medical advance directive? No - Patient declined  Would patient like information on creating a medical advance directive? -  Pre-existing out of facility DNR order (yellow form or pink MOST form) Physician notified to receive inpatient order     Chief Complaint  Patient presents with  . Acute Visit    UTI    HPI:  Pt is a 84 y.o. female seen today for an acute visit for fell 12/11/16, sustained right forehead/facial abrasion, ED evaluation: UTI, started on Keflex, CT head no acute fractures or bleed. No new focal neurological symptoms seen today upon my visit. Serum K 3.2, wbc 10., neutrophils 73, UA positive nitrite, wbc >50 12/11/17. Hx of dementia, resides in [facility], ambulates with walker. Hx of Afib, heart rate is in control, on Coumadin for thromboembolic risk reduction.    The patient denied dysuria, urinary urgency, lower abd/back pain, she denied HA, dizziness, change of vision, weakness in limbs, she denied chest pain/palpitation, nausea, vomiting, or constipation. She is afebrile, no O2 desaturation.    Past Medical History:    Diagnosis Date  . Anticoagulant long-term use   . Atrial fibrillation (HCC)   . CHF (congestive heart failure) (HCC)   . Chronic anticoagulation   . Hypertension   . Raynaud phenomenon   . Tremor, essential    Past Surgical History:  Procedure Laterality Date  . CHOLECYSTECTOMY    . TONSILLECTOMY      Allergies  Allergen Reactions  . Fosamax [Alendronate]   . Other Nausea And Vomiting    Green pepper    Outpatient Encounter Medications as of 12/12/2017  Medication Sig  . Calcium Carbonate-Vit D-Min (CALTRATE 600+D PLUS MINERALS) 600-800 MG-UNIT TABS Take 1 tablet by mouth daily.  . Camphor-Eucalyptus-Menthol (VICKS VAPORUB EX) Apply topically 2 (two) times daily as needed (for congestion).  . cephALEXin (KEFLEX) 500 MG capsule Take 1 capsule (500 mg total) by mouth 3 (three) times daily.  . chlorthalidone (HYGROTON) 25 MG tablet Take 25 mg by mouth daily.   Marland Kitchen gabapentin (NEURONTIN) 100 MG capsule Take 100 mg by mouth daily as needed (for neuropathy).   Marland Kitchen gentamicin cream (GARAMYCIN) 0.1 % Apply 1 application topically 3 (three) times daily.  . polyvinyl alcohol (LIQUIFILM TEARS) 1.4 % ophthalmic solution Place 1 drop into both eyes 3 (three) times daily.  . potassium chloride (K-DUR) 10 MEQ tablet Take 1 tablet (10 mEq total) by mouth daily.  . propranolol ER (INDERAL LA) 80 MG 24 hr capsule TAKE 1 CAPSULE BY MOUTH  DAILY  . saccharomyces boulardii (FLORASTOR) 250 MG capsule Take 250 mg by mouth 2 (two) times daily.  Marland Kitchen warfarin (COUMADIN) 3  MG tablet Take 3 mg by mouth See admin instructions. Take Mon, Tues, Wed, Fri.  . warfarin (COUMADIN) 4 MG tablet Take 4 mg by mouth See admin instructions. Take on Sun, Thurs, Sat   No facility-administered encounter medications on file as of 12/12/2017.    ROS was provided with assistance of staff Review of Systems  Constitutional: Positive for activity change, appetite change and fatigue. Negative for chills, diaphoresis and fever.   HENT: Positive for hearing loss. Negative for congestion, trouble swallowing and voice change.   Respiratory: Negative for cough, shortness of breath and wheezing.   Cardiovascular: Positive for leg swelling. Negative for palpitations.  Gastrointestinal: Negative for abdominal distention, abdominal pain, constipation, diarrhea, nausea and vomiting.  Genitourinary: Negative for difficulty urinating, dysuria and urgency.  Musculoskeletal: Positive for gait problem.  Skin: Positive for color change and wound.  Neurological: Positive for tremors. Negative for dizziness, syncope, facial asymmetry, weakness, numbness and headaches.       Memory loss  Psychiatric/Behavioral: Positive for confusion. Negative for agitation, behavioral problems, hallucinations and sleep disturbance. The patient is not nervous/anxious.     Immunization History  Administered Date(s) Administered  . Influenza-Unspecified 05/28/2017  . Pneumococcal Conjugate-13 07/13/2017  . Tdap 07/13/2017   Pertinent  Health Maintenance Due  Topic Date Due  . INFLUENZA VACCINE  03/07/2018  . PNA vac Low Risk Adult (2 of 2 - PPSV23) 07/13/2018  . DEXA SCAN  Completed   Fall Risk  10/05/2017  Falls in the past year? No   Functional Status Survey:    Vitals:   12/12/17 1139  BP: 130/70  Pulse: 70  Resp: 16  Temp: 97.7 F (36.5 C)  SpO2: 93%  Weight: 122 lb 3.2 oz (55.4 kg)  Height:  (1.676 m)   Body mass index is 19.72 kg/m. Physical Exam  Constitutional: She appears well-developed and well-nourished. No distress.  HENT:  Head: Normocephalic.  Nose: Nose normal.  Right forehead/facial abrasion  Eyes: Pupils are equal, round, and reactive to light. Conjunctivae and EOM are normal. Right eye exhibits no discharge. Left eye exhibits no discharge.  Neck: Normal range of motion. Neck supple. No thyromegaly present.  Cardiovascular: Normal rate.  Murmur heard. Irregular heart beats.   Pulmonary/Chest: Effort  normal. She has no wheezes. She has no rales.  Abdominal: Soft. Bowel sounds are normal. She exhibits no distension. There is no tenderness. There is no rebound and no guarding.  Musculoskeletal: She exhibits edema.  Trace edema BLE  Neurological: She is alert. No cranial nerve deficit. She exhibits normal muscle tone. Coordination normal.  Oriented to person and place.   Skin: Skin is warm and dry. She is not diaphoretic.  Abrasion right forehead and peri orbital and right upper face.   Psychiatric: She has a normal mood and affect. Her behavior is normal.    Labs reviewed: Recent Labs    01/26/17 1317  08/16/17 10/18/17 12/11/17 0807  NA 136   < > 137 142 139  K 4.0   < > 3.7 3.6 3.2*  CL 100*  --   --   --  95*  CO2 26  --   --   --  35*  GLUCOSE 104*  --   --   --  119*  BUN 15   < > 32* 30* 30*  CREATININE 1.01*   < > 1.1 1.2* 1.23*  CALCIUM 9.4  --   --   --  10.0   < > =  values in this interval not displayed.   Recent Labs    04/26/17 07/12/17 10/18/17  AST ALT ALKPHOS 34 39 31   Recent Labs    01/26/17 1317  10/18/17 12/05/17 12/11/17 0625  WBC 7.5   < > 8.0 8.8 10.1  NEUTROABS 4.8  --   --  4,928 7.4  HGB 13.0   < > 12.6 12.4 14.2  HCT 39.3   < > 36 36 42.0  MCV 90.1  --   --   --  90.3  PLT 218   < > 182 188 247   < > = values in this interval not displayed.   Lab Results  Component Value Date   TSH 0.90 07/12/2017   No results found for: HGBA1C No results found for: CHOL, HDL, LDLCALC, LDLDIRECT, TRIG, CHOLHDL  Significant Diagnostic Results in last 30 days:  Ct Head Wo Contrast  Result Date: 12/11/2017 CLINICAL DATA:  84 year old female presents with un witnessed fall and increased confusion over the past 3 days. Abrasion nose and left-side of face. Baseline dementia. On Coumadin. Initial encounter. EXAM: CT HEAD WITHOUT CONTRAST CT MAXILLOFACIAL WITHOUT CONTRAST CT CERVICAL SPINE WITHOUT CONTRAST TECHNIQUE: Multidetector CT imaging  of the head, cervical spine, and maxillofacial structures were performed using the standard protocol without intravenous contrast. Multiplanar CT image reconstructions of the cervical spine and maxillofacial structures were also generated. COMPARISON:  07/04/2016 head and cervical spine CT. FINDINGS: CT HEAD FINDINGS Brain: No intracranial hemorrhage or CT evidence of large acute infarct. Prominent chronic microvascular changes. Global atrophy. No intracranial mass lesion noted on this unenhanced exam. Vascular: Vascular calcifications Skull: No skull fracture Other: No acute orbital abnormality. CT MAXILLOFACIAL FINDINGS Osseous: Inferior aspect of the chin is not included on present exam. No facial fracture otherwise noted. Orbits: No acute orbital abnormality. Sinuses: Minimal mucosal thickening left maxillary sinus. Soft tissues: Negative CT CERVICAL SPINE FINDINGS Alignment: Curvature convex left unchanged from prior exam. Skull base and vertebrae: No cervical spine fracture noted. Soft tissues and spinal canal: No abnormal prevertebral soft tissue swelling or high-grade spinal stenosis. Disc levels:  Mild degenerative changes most notable C2-3. Upper chest: No worrisome lung apical lesion Other: Carotid bifurcation calcifications. IMPRESSION: No skull fracture or intracranial hemorrhage. Inferior aspect of the chin is not included on present exam. No facial fracture otherwise noted. Curvature cervical spine convex left unchanged. No cervical spine fracture or abnormal prevertebral soft tissue swelling. Chronic changes otherwise as noted above. Electronically Signed   By: Lacy Duverney M.D.   On: 12/11/2017 10:36   Ct Cervical Spine Wo Contrast  Result Date: 12/11/2017 CLINICAL DATA:  84 year old female presents with un witnessed fall and increased confusion over the past 3 days. Abrasion nose and left-side of face. Baseline dementia. On Coumadin. Initial encounter. EXAM: CT HEAD WITHOUT CONTRAST CT  MAXILLOFACIAL WITHOUT CONTRAST CT CERVICAL SPINE WITHOUT CONTRAST TECHNIQUE: Multidetector CT imaging of the head, cervical spine, and maxillofacial structures were performed using the standard protocol without intravenous contrast. Multiplanar CT image reconstructions of the cervical spine and maxillofacial structures were also generated. COMPARISON:  07/04/2016 head and cervical spine CT. FINDINGS: CT HEAD FINDINGS Brain: No intracranial hemorrhage or CT evidence of large acute infarct. Prominent chronic microvascular changes. Global atrophy. No intracranial mass lesion noted on this unenhanced exam. Vascular: Vascular calcifications Skull: No skull fracture Other: No acute orbital abnormality. CT MAXILLOFACIAL FINDINGS Osseous: Inferior aspect of the chin is not included  on present exam. No facial fracture otherwise noted. Orbits: No acute orbital abnormality. Sinuses: Minimal mucosal thickening left maxillary sinus. Soft tissues: Negative CT CERVICAL SPINE FINDINGS Alignment: Curvature convex left unchanged from prior exam. Skull base and vertebrae: No cervical spine fracture noted. Soft tissues and spinal canal: No abnormal prevertebral soft tissue swelling or high-grade spinal stenosis. Disc levels:  Mild degenerative changes most notable C2-3. Upper chest: No worrisome lung apical lesion Other: Carotid bifurcation calcifications. IMPRESSION: No skull fracture or intracranial hemorrhage. Inferior aspect of the chin is not included on present exam. No facial fracture otherwise noted. Curvature cervical spine convex left unchanged. No cervical spine fracture or abnormal prevertebral soft tissue swelling. Chronic changes otherwise as noted above. Electronically Signed   By: Lacy Duverney M.D.   On: 12/11/2017 10:36   Ct Maxillofacial Wo Contrast  Result Date: 12/11/2017 CLINICAL DATA:  84 year old female presents with un witnessed fall and increased confusion over the past 3 days. Abrasion nose and left-side  of face. Baseline dementia. On Coumadin. Initial encounter. EXAM: CT HEAD WITHOUT CONTRAST CT MAXILLOFACIAL WITHOUT CONTRAST CT CERVICAL SPINE WITHOUT CONTRAST TECHNIQUE: Multidetector CT imaging of the head, cervical spine, and maxillofacial structures were performed using the standard protocol without intravenous contrast. Multiplanar CT image reconstructions of the cervical spine and maxillofacial structures were also generated. COMPARISON:  07/04/2016 head and cervical spine CT. FINDINGS: CT HEAD FINDINGS Brain: No intracranial hemorrhage or CT evidence of large acute infarct. Prominent chronic microvascular changes. Global atrophy. No intracranial mass lesion noted on this unenhanced exam. Vascular: Vascular calcifications Skull: No skull fracture Other: No acute orbital abnormality. CT MAXILLOFACIAL FINDINGS Osseous: Inferior aspect of the chin is not included on present exam. No facial fracture otherwise noted. Orbits: No acute orbital abnormality. Sinuses: Minimal mucosal thickening left maxillary sinus. Soft tissues: Negative CT CERVICAL SPINE FINDINGS Alignment: Curvature convex left unchanged from prior exam. Skull base and vertebrae: No cervical spine fracture noted. Soft tissues and spinal canal: No abnormal prevertebral soft tissue swelling or high-grade spinal stenosis. Disc levels:  Mild degenerative changes most notable C2-3. Upper chest: No worrisome lung apical lesion Other: Carotid bifurcation calcifications. IMPRESSION: No skull fracture or intracranial hemorrhage. Inferior aspect of the chin is not included on present exam. No facial fracture otherwise noted. Curvature cervical spine convex left unchanged. No cervical spine fracture or abnormal prevertebral soft tissue swelling. Chronic changes otherwise as noted above. Electronically Signed   By: Lacy Duverney M.D.   On: 12/11/2017 10:36    Assessment/Plan UTI (urinary tract infection) ED wbc 10., neutrophils 73, UA positive nitrite, wbc  >50 12/11/17, will complete Keflex. Observe.   Hypokalemia  Serum K 3.2 in ED, repeat BMP, continue Kcl daily.   Facial abrasion, subsequent encounter  fell 12/11/16, sustained right forehead/facial abrasion, ED evaluation, left forehead/face, no sign of infection, should heal.   Fall 12/11/16 sustained left forehead/facial abrasion, lack of safety awareness and increased frailty contributory, close supervision needed, possible memory care unit for care.   Mixed Alzheimer's and vascular dementia Close supervision for safety, recommend memory care unit for care needs.   Paroxysmal atrial fibrillation (HCC) Heart rate is in control, repeat PT/INR since ABT started 12/11/17.     Family/ staff Communication: plan of care reviewed with the patient and charge nurse.   Labs/tests ordered:  BMP PT/INR  Time spend 25 minutes.

## 2017-12-13 DIAGNOSIS — M6281 Muscle weakness (generalized): Secondary | ICD-10-CM | POA: Diagnosis not present

## 2017-12-13 DIAGNOSIS — Z79899 Other long term (current) drug therapy: Secondary | ICD-10-CM | POA: Diagnosis not present

## 2017-12-13 DIAGNOSIS — I10 Essential (primary) hypertension: Secondary | ICD-10-CM | POA: Diagnosis not present

## 2017-12-13 DIAGNOSIS — R2681 Unsteadiness on feet: Secondary | ICD-10-CM | POA: Diagnosis not present

## 2017-12-13 DIAGNOSIS — Z9181 History of falling: Secondary | ICD-10-CM | POA: Diagnosis not present

## 2017-12-13 DIAGNOSIS — M25552 Pain in left hip: Secondary | ICD-10-CM | POA: Diagnosis not present

## 2017-12-13 DIAGNOSIS — R32 Unspecified urinary incontinence: Secondary | ICD-10-CM | POA: Diagnosis not present

## 2017-12-13 DIAGNOSIS — I4891 Unspecified atrial fibrillation: Secondary | ICD-10-CM | POA: Diagnosis not present

## 2017-12-13 DIAGNOSIS — R1312 Dysphagia, oropharyngeal phase: Secondary | ICD-10-CM | POA: Diagnosis not present

## 2017-12-13 DIAGNOSIS — N3946 Mixed incontinence: Secondary | ICD-10-CM | POA: Diagnosis not present

## 2017-12-13 LAB — BASIC METABOLIC PANEL
BUN: 36 — AB (ref 4–21)
CREATININE: 1.3 — AB (ref 0.5–1.1)
Glucose: 97
POTASSIUM: 3.4 (ref 3.4–5.3)
Sodium: 137 (ref 137–147)

## 2017-12-13 LAB — PROTIME-INR: Protime: 24.7 — AB (ref 10.0–13.8)

## 2017-12-14 DIAGNOSIS — R1312 Dysphagia, oropharyngeal phase: Secondary | ICD-10-CM | POA: Diagnosis not present

## 2017-12-14 DIAGNOSIS — Z9181 History of falling: Secondary | ICD-10-CM | POA: Diagnosis not present

## 2017-12-14 DIAGNOSIS — R2681 Unsteadiness on feet: Secondary | ICD-10-CM | POA: Diagnosis not present

## 2017-12-14 DIAGNOSIS — I5023 Acute on chronic systolic (congestive) heart failure: Secondary | ICD-10-CM | POA: Diagnosis not present

## 2017-12-14 DIAGNOSIS — N3946 Mixed incontinence: Secondary | ICD-10-CM | POA: Diagnosis not present

## 2017-12-14 DIAGNOSIS — M6281 Muscle weakness (generalized): Secondary | ICD-10-CM | POA: Diagnosis not present

## 2017-12-14 DIAGNOSIS — R32 Unspecified urinary incontinence: Secondary | ICD-10-CM | POA: Diagnosis not present

## 2017-12-14 DIAGNOSIS — Z79899 Other long term (current) drug therapy: Secondary | ICD-10-CM | POA: Diagnosis not present

## 2017-12-14 DIAGNOSIS — M25552 Pain in left hip: Secondary | ICD-10-CM | POA: Diagnosis not present

## 2017-12-14 LAB — POCT INR: INR: 2.6 — AB (ref <=1.1)

## 2017-12-14 LAB — BASIC METABOLIC PANEL
CHLORIDE: 93
CO2: 34
Calcium: 9.9
GFR CALC NON AF AMER: 35

## 2017-12-14 LAB — PROTIME-INR: PROTIME: 26.9 — AB (ref 10.0–13.8)

## 2017-12-17 ENCOUNTER — Other Ambulatory Visit: Payer: Self-pay | Admitting: *Deleted

## 2017-12-17 DIAGNOSIS — M6281 Muscle weakness (generalized): Secondary | ICD-10-CM | POA: Diagnosis not present

## 2017-12-17 DIAGNOSIS — R2681 Unsteadiness on feet: Secondary | ICD-10-CM | POA: Diagnosis not present

## 2017-12-17 DIAGNOSIS — Z9181 History of falling: Secondary | ICD-10-CM | POA: Diagnosis not present

## 2017-12-17 DIAGNOSIS — R1312 Dysphagia, oropharyngeal phase: Secondary | ICD-10-CM | POA: Diagnosis not present

## 2017-12-17 DIAGNOSIS — M25552 Pain in left hip: Secondary | ICD-10-CM | POA: Diagnosis not present

## 2017-12-17 DIAGNOSIS — N3946 Mixed incontinence: Secondary | ICD-10-CM | POA: Diagnosis not present

## 2017-12-18 DIAGNOSIS — N3946 Mixed incontinence: Secondary | ICD-10-CM | POA: Diagnosis not present

## 2017-12-18 DIAGNOSIS — M6281 Muscle weakness (generalized): Secondary | ICD-10-CM | POA: Diagnosis not present

## 2017-12-18 DIAGNOSIS — M25552 Pain in left hip: Secondary | ICD-10-CM | POA: Diagnosis not present

## 2017-12-18 DIAGNOSIS — Z9181 History of falling: Secondary | ICD-10-CM | POA: Diagnosis not present

## 2017-12-18 DIAGNOSIS — R1312 Dysphagia, oropharyngeal phase: Secondary | ICD-10-CM | POA: Diagnosis not present

## 2017-12-18 DIAGNOSIS — R2681 Unsteadiness on feet: Secondary | ICD-10-CM | POA: Diagnosis not present

## 2017-12-19 DIAGNOSIS — M6281 Muscle weakness (generalized): Secondary | ICD-10-CM | POA: Diagnosis not present

## 2017-12-19 DIAGNOSIS — R1312 Dysphagia, oropharyngeal phase: Secondary | ICD-10-CM | POA: Diagnosis not present

## 2017-12-19 DIAGNOSIS — R2681 Unsteadiness on feet: Secondary | ICD-10-CM | POA: Diagnosis not present

## 2017-12-19 DIAGNOSIS — Z9181 History of falling: Secondary | ICD-10-CM | POA: Diagnosis not present

## 2017-12-19 DIAGNOSIS — M25552 Pain in left hip: Secondary | ICD-10-CM | POA: Diagnosis not present

## 2017-12-19 DIAGNOSIS — I5031 Acute diastolic (congestive) heart failure: Secondary | ICD-10-CM | POA: Diagnosis not present

## 2017-12-19 DIAGNOSIS — Z79899 Other long term (current) drug therapy: Secondary | ICD-10-CM | POA: Diagnosis not present

## 2017-12-19 DIAGNOSIS — N3946 Mixed incontinence: Secondary | ICD-10-CM | POA: Diagnosis not present

## 2017-12-20 DIAGNOSIS — M25552 Pain in left hip: Secondary | ICD-10-CM | POA: Diagnosis not present

## 2017-12-20 DIAGNOSIS — N3946 Mixed incontinence: Secondary | ICD-10-CM | POA: Diagnosis not present

## 2017-12-20 DIAGNOSIS — Z9181 History of falling: Secondary | ICD-10-CM | POA: Diagnosis not present

## 2017-12-20 DIAGNOSIS — R1312 Dysphagia, oropharyngeal phase: Secondary | ICD-10-CM | POA: Diagnosis not present

## 2017-12-20 DIAGNOSIS — M6281 Muscle weakness (generalized): Secondary | ICD-10-CM | POA: Diagnosis not present

## 2017-12-20 DIAGNOSIS — R2681 Unsteadiness on feet: Secondary | ICD-10-CM | POA: Diagnosis not present

## 2017-12-21 DIAGNOSIS — N3946 Mixed incontinence: Secondary | ICD-10-CM | POA: Diagnosis not present

## 2017-12-21 DIAGNOSIS — R1312 Dysphagia, oropharyngeal phase: Secondary | ICD-10-CM | POA: Diagnosis not present

## 2017-12-21 DIAGNOSIS — M25552 Pain in left hip: Secondary | ICD-10-CM | POA: Diagnosis not present

## 2017-12-21 DIAGNOSIS — M6281 Muscle weakness (generalized): Secondary | ICD-10-CM | POA: Diagnosis not present

## 2017-12-21 DIAGNOSIS — R2681 Unsteadiness on feet: Secondary | ICD-10-CM | POA: Diagnosis not present

## 2017-12-21 DIAGNOSIS — Z9181 History of falling: Secondary | ICD-10-CM | POA: Diagnosis not present

## 2017-12-24 DIAGNOSIS — R2681 Unsteadiness on feet: Secondary | ICD-10-CM | POA: Diagnosis not present

## 2017-12-24 DIAGNOSIS — N3946 Mixed incontinence: Secondary | ICD-10-CM | POA: Diagnosis not present

## 2017-12-24 DIAGNOSIS — M6281 Muscle weakness (generalized): Secondary | ICD-10-CM | POA: Diagnosis not present

## 2017-12-24 DIAGNOSIS — Z9181 History of falling: Secondary | ICD-10-CM | POA: Diagnosis not present

## 2017-12-24 DIAGNOSIS — R1312 Dysphagia, oropharyngeal phase: Secondary | ICD-10-CM | POA: Diagnosis not present

## 2017-12-24 DIAGNOSIS — M25552 Pain in left hip: Secondary | ICD-10-CM | POA: Diagnosis not present

## 2017-12-25 DIAGNOSIS — R1312 Dysphagia, oropharyngeal phase: Secondary | ICD-10-CM | POA: Diagnosis not present

## 2017-12-25 DIAGNOSIS — Z9181 History of falling: Secondary | ICD-10-CM | POA: Diagnosis not present

## 2017-12-25 DIAGNOSIS — M25552 Pain in left hip: Secondary | ICD-10-CM | POA: Diagnosis not present

## 2017-12-25 DIAGNOSIS — R2681 Unsteadiness on feet: Secondary | ICD-10-CM | POA: Diagnosis not present

## 2017-12-25 DIAGNOSIS — M6281 Muscle weakness (generalized): Secondary | ICD-10-CM | POA: Diagnosis not present

## 2017-12-25 DIAGNOSIS — N3946 Mixed incontinence: Secondary | ICD-10-CM | POA: Diagnosis not present

## 2017-12-27 ENCOUNTER — Other Ambulatory Visit: Payer: Self-pay | Admitting: *Deleted

## 2017-12-27 DIAGNOSIS — I5023 Acute on chronic systolic (congestive) heart failure: Secondary | ICD-10-CM | POA: Diagnosis not present

## 2017-12-27 DIAGNOSIS — M6281 Muscle weakness (generalized): Secondary | ICD-10-CM | POA: Diagnosis not present

## 2017-12-27 DIAGNOSIS — R1312 Dysphagia, oropharyngeal phase: Secondary | ICD-10-CM | POA: Diagnosis not present

## 2017-12-27 DIAGNOSIS — Z79899 Other long term (current) drug therapy: Secondary | ICD-10-CM | POA: Diagnosis not present

## 2017-12-27 DIAGNOSIS — M25552 Pain in left hip: Secondary | ICD-10-CM | POA: Diagnosis not present

## 2017-12-27 DIAGNOSIS — N3946 Mixed incontinence: Secondary | ICD-10-CM | POA: Diagnosis not present

## 2017-12-27 DIAGNOSIS — Z9181 History of falling: Secondary | ICD-10-CM | POA: Diagnosis not present

## 2017-12-27 DIAGNOSIS — R2681 Unsteadiness on feet: Secondary | ICD-10-CM | POA: Diagnosis not present

## 2017-12-27 LAB — POCT INR: INR: 2.8 — AB (ref <=1.1)

## 2017-12-27 LAB — PROTIME-INR: Protime: 29.6 — AB (ref 10.0–13.8)

## 2017-12-28 DIAGNOSIS — R1312 Dysphagia, oropharyngeal phase: Secondary | ICD-10-CM | POA: Diagnosis not present

## 2017-12-28 DIAGNOSIS — M6281 Muscle weakness (generalized): Secondary | ICD-10-CM | POA: Diagnosis not present

## 2017-12-28 DIAGNOSIS — N3946 Mixed incontinence: Secondary | ICD-10-CM | POA: Diagnosis not present

## 2017-12-28 DIAGNOSIS — M25552 Pain in left hip: Secondary | ICD-10-CM | POA: Diagnosis not present

## 2017-12-28 DIAGNOSIS — R2681 Unsteadiness on feet: Secondary | ICD-10-CM | POA: Diagnosis not present

## 2017-12-28 DIAGNOSIS — Z9181 History of falling: Secondary | ICD-10-CM | POA: Diagnosis not present

## 2018-01-01 DIAGNOSIS — R2681 Unsteadiness on feet: Secondary | ICD-10-CM | POA: Diagnosis not present

## 2018-01-01 DIAGNOSIS — N3946 Mixed incontinence: Secondary | ICD-10-CM | POA: Diagnosis not present

## 2018-01-01 DIAGNOSIS — M6281 Muscle weakness (generalized): Secondary | ICD-10-CM | POA: Diagnosis not present

## 2018-01-01 DIAGNOSIS — R1312 Dysphagia, oropharyngeal phase: Secondary | ICD-10-CM | POA: Diagnosis not present

## 2018-01-01 DIAGNOSIS — M25552 Pain in left hip: Secondary | ICD-10-CM | POA: Diagnosis not present

## 2018-01-01 DIAGNOSIS — Z9181 History of falling: Secondary | ICD-10-CM | POA: Diagnosis not present

## 2018-01-02 DIAGNOSIS — N3946 Mixed incontinence: Secondary | ICD-10-CM | POA: Diagnosis not present

## 2018-01-02 DIAGNOSIS — R2681 Unsteadiness on feet: Secondary | ICD-10-CM | POA: Diagnosis not present

## 2018-01-02 DIAGNOSIS — R1312 Dysphagia, oropharyngeal phase: Secondary | ICD-10-CM | POA: Diagnosis not present

## 2018-01-02 DIAGNOSIS — M6281 Muscle weakness (generalized): Secondary | ICD-10-CM | POA: Diagnosis not present

## 2018-01-02 DIAGNOSIS — Z9181 History of falling: Secondary | ICD-10-CM | POA: Diagnosis not present

## 2018-01-02 DIAGNOSIS — M25552 Pain in left hip: Secondary | ICD-10-CM | POA: Diagnosis not present

## 2018-01-03 DIAGNOSIS — M6281 Muscle weakness (generalized): Secondary | ICD-10-CM | POA: Diagnosis not present

## 2018-01-03 DIAGNOSIS — Z7901 Long term (current) use of anticoagulants: Secondary | ICD-10-CM | POA: Diagnosis not present

## 2018-01-03 DIAGNOSIS — Z9181 History of falling: Secondary | ICD-10-CM | POA: Diagnosis not present

## 2018-01-03 DIAGNOSIS — R1312 Dysphagia, oropharyngeal phase: Secondary | ICD-10-CM | POA: Diagnosis not present

## 2018-01-03 DIAGNOSIS — R2681 Unsteadiness on feet: Secondary | ICD-10-CM | POA: Diagnosis not present

## 2018-01-03 DIAGNOSIS — D689 Coagulation defect, unspecified: Secondary | ICD-10-CM | POA: Diagnosis not present

## 2018-01-03 DIAGNOSIS — N3946 Mixed incontinence: Secondary | ICD-10-CM | POA: Diagnosis not present

## 2018-01-03 DIAGNOSIS — M25552 Pain in left hip: Secondary | ICD-10-CM | POA: Diagnosis not present

## 2018-01-03 LAB — POCT INR: INR: 3 — AB (ref <=1.1)

## 2018-01-04 DIAGNOSIS — M25552 Pain in left hip: Secondary | ICD-10-CM | POA: Diagnosis not present

## 2018-01-04 DIAGNOSIS — R1312 Dysphagia, oropharyngeal phase: Secondary | ICD-10-CM | POA: Diagnosis not present

## 2018-01-04 DIAGNOSIS — R2681 Unsteadiness on feet: Secondary | ICD-10-CM | POA: Diagnosis not present

## 2018-01-04 DIAGNOSIS — Z9181 History of falling: Secondary | ICD-10-CM | POA: Diagnosis not present

## 2018-01-04 DIAGNOSIS — M6281 Muscle weakness (generalized): Secondary | ICD-10-CM | POA: Diagnosis not present

## 2018-01-04 DIAGNOSIS — N3946 Mixed incontinence: Secondary | ICD-10-CM | POA: Diagnosis not present

## 2018-01-06 DIAGNOSIS — M545 Low back pain: Secondary | ICD-10-CM | POA: Diagnosis not present

## 2018-01-07 ENCOUNTER — Other Ambulatory Visit: Payer: Self-pay | Admitting: *Deleted

## 2018-01-07 DIAGNOSIS — Z9181 History of falling: Secondary | ICD-10-CM | POA: Diagnosis not present

## 2018-01-07 DIAGNOSIS — N390 Urinary tract infection, site not specified: Secondary | ICD-10-CM | POA: Diagnosis not present

## 2018-01-07 DIAGNOSIS — Z7389 Other problems related to life management difficulty: Secondary | ICD-10-CM | POA: Diagnosis not present

## 2018-01-07 DIAGNOSIS — N3946 Mixed incontinence: Secondary | ICD-10-CM | POA: Diagnosis not present

## 2018-01-07 DIAGNOSIS — M6281 Muscle weakness (generalized): Secondary | ICD-10-CM | POA: Diagnosis not present

## 2018-01-08 ENCOUNTER — Encounter: Payer: Self-pay | Admitting: Nurse Practitioner

## 2018-01-08 ENCOUNTER — Non-Acute Institutional Stay: Payer: Medicare Other | Admitting: Nurse Practitioner

## 2018-01-08 DIAGNOSIS — N183 Chronic kidney disease, stage 3 unspecified: Secondary | ICD-10-CM

## 2018-01-08 DIAGNOSIS — E876 Hypokalemia: Secondary | ICD-10-CM | POA: Diagnosis not present

## 2018-01-08 DIAGNOSIS — R0602 Shortness of breath: Secondary | ICD-10-CM | POA: Diagnosis not present

## 2018-01-08 DIAGNOSIS — F0150 Vascular dementia without behavioral disturbance: Secondary | ICD-10-CM

## 2018-01-08 DIAGNOSIS — M545 Low back pain, unspecified: Secondary | ICD-10-CM | POA: Insufficient documentation

## 2018-01-08 DIAGNOSIS — F0280 Dementia in other diseases classified elsewhere without behavioral disturbance: Secondary | ICD-10-CM | POA: Diagnosis not present

## 2018-01-08 DIAGNOSIS — N390 Urinary tract infection, site not specified: Secondary | ICD-10-CM | POA: Diagnosis not present

## 2018-01-08 DIAGNOSIS — N3946 Mixed incontinence: Secondary | ICD-10-CM | POA: Diagnosis not present

## 2018-01-08 DIAGNOSIS — Z7901 Long term (current) use of anticoagulants: Secondary | ICD-10-CM | POA: Diagnosis not present

## 2018-01-08 DIAGNOSIS — Z7389 Other problems related to life management difficulty: Secondary | ICD-10-CM | POA: Diagnosis not present

## 2018-01-08 DIAGNOSIS — G309 Alzheimer's disease, unspecified: Secondary | ICD-10-CM

## 2018-01-08 DIAGNOSIS — M6281 Muscle weakness (generalized): Secondary | ICD-10-CM | POA: Diagnosis not present

## 2018-01-08 DIAGNOSIS — Z9181 History of falling: Secondary | ICD-10-CM | POA: Diagnosis not present

## 2018-01-08 NOTE — Progress Notes (Signed)
Location:  [facility] Room Number: 806 Place of Service:  ALF [phone]) Provider:  Anzleigh Slaven, Arna Snipe  NP  Oneal Grout, MD  Patient Care Team: Oneal Grout, MD as PCP - General (Internal Medicine) Nahser, Deloris Ping, MD as PCP - Cardiology (Cardiology)  Extended Emergency Contact Information Primary Emergency Contact: Morgan,Tom Address: [address]          [place], [place] [address] [place] of [place] Home Phone: [phone] Relation: Friend Secondary Emergency Contact: Brandy Hale [place] of [place] Home Phone: [phone] Relation: Niece  Code Status:  DNR Goals of care: Advanced Directive information Advanced Directives 01/08/2018  Does Patient Have a Medical Advance Directive? Yes  Type of Estate agent of [place];Out of facility DNR (pink MOST or yellow form)  Does patient want to make changes to medical advance directive? No - Patient declined  Copy of Healthcare Power of Attorney in Chart? Yes  Would patient like information on creating a medical advance directive? -  Pre-existing out of facility DNR order (yellow form or pink MOST form) Yellow form placed in chart (order not valid for inpatient use)     Chief Complaint  Patient presents with  . Acute Visit    F/u - lumbar pain    HPI:  Pt is a 84 y.o. female seen today for an acute visit for acute onset lower back pain x 2-3 days, worsened with movement, not traveling down to buttocks or legs, able to sleep at night, ambulates with walker. No recollection of form of injury. She denied chest pain/pressure, cough, abd pain, dysuria, or constipation/diarrhea. X-ray lumbar spine done 01/06/18 showed 30% anterior wedging with kyphoplasty in L2 vertebral body. HPI was provided with assistance of staff due to her dementia.    Past Medical History:  Diagnosis Date  . Anticoagulant long-term use   . Atrial fibrillation (HCC)   . CHF (congestive heart failure)  (HCC)   . Chronic anticoagulation   . Hypertension   . Raynaud phenomenon   . Tremor, essential    Past Surgical History:  Procedure Laterality Date  . CHOLECYSTECTOMY    . TONSILLECTOMY      Allergies  Allergen Reactions  . Fosamax [Alendronate]   . Other Nausea And Vomiting    Green pepper    Outpatient Encounter Medications as of 01/08/2018  Medication Sig  . Calcium Carbonate-Vit D-Min (CALTRATE 600+D PLUS MINERALS) 600-800 MG-UNIT TABS Take 1 tablet by mouth daily.  . Camphor-Eucalyptus-Menthol (VICKS VAPORUB EX) Apply topically 2 (two) times daily as needed (for congestion).  . chlorthalidone (HYGROTON) 25 MG tablet Take 25 mg by mouth daily.   Marland Kitchen gabapentin (NEURONTIN) 100 MG capsule Take 100 mg by mouth daily as needed (for neuropathy).   . lactose free nutrition (BOOST) LIQD Take 237 mLs by mouth 2 (two) times daily between meals.  . polyvinyl alcohol (LIQUIFILM TEARS) 1.4 % ophthalmic solution Place 1 drop into both eyes 3 (three) times daily.  . potassium chloride (K-DUR) 10 MEQ tablet Take 1 tablet (10 mEq total) by mouth daily.  . propranolol ER (INDERAL LA) 80 MG 24 hr capsule TAKE 1 CAPSULE BY MOUTH  DAILY  . warfarin (COUMADIN) 3 MG tablet Take 3 mg by mouth See admin instructions. Take Mon, Tues, Wed, Fri.  . [DISCONTINUED] cephALEXin (KEFLEX) 500 MG capsule Take 1 capsule (500 mg total) by mouth 3 (three) times daily.  . [DISCONTINUED] gentamicin cream (GARAMYCIN) 0.1 % Apply 1 application topically  3 (three) times daily.  . [DISCONTINUED] warfarin (COUMADIN) 4 MG tablet Take 4 mg by mouth See admin instructions. Take on Sun, Thurs, Sat   No facility-administered encounter medications on file as of 01/08/2018.    ROS was provided with assistance of staff Review of Systems  Constitutional: Positive for activity change and fatigue. Negative for appetite change, chills, diaphoresis and fever.  HENT: Positive for hearing loss. Negative for congestion and trouble  swallowing.   Eyes: Negative for visual disturbance.  Respiratory: Negative for cough, shortness of breath and wheezing.   Cardiovascular: Positive for leg swelling. Negative for chest pain and palpitations.  Gastrointestinal: Negative for abdominal distention, abdominal pain, constipation, diarrhea, nausea and vomiting.  Genitourinary: Negative for difficulty urinating, dysuria, flank pain, hematuria and urgency.  Musculoskeletal: Positive for arthralgias, back pain and gait problem. Negative for joint swelling.  Skin: Negative for color change and pallor.  Neurological: Positive for tremors. Negative for dizziness, speech difficulty, weakness and headaches.       Memory impairment. Fine resting tremor in fingers, not disabling.   Psychiatric/Behavioral: Positive for confusion. Negative for agitation, behavioral problems, hallucinations and sleep disturbance. The patient is not nervous/anxious.     Immunization History  Administered Date(s) Administered  . Influenza-Unspecified 05/28/2017  . Pneumococcal Conjugate-13 07/13/2017  . Tdap 07/13/2017   Pertinent  Health Maintenance Due  Topic Date Due  . INFLUENZA VACCINE  03/07/2018  . PNA vac Low Risk Adult (2 of 2 - PPSV23) 07/13/2018  . DEXA SCAN  Completed   Fall Risk  10/05/2017  Falls in the past year? No   Functional Status Survey:    Vitals:   01/08/18 1059  BP: 120/60  Pulse: 68  Resp: 18  Temp: 98.5 F (36.9 C)  SpO2: 93%  Weight: 130 lb (59 kg)  Height: 5\' 4"  (1.626 m)   Body mass index is 22.31 kg/m. Physical Exam  Constitutional: She appears well-developed and well-nourished. No distress.  HENT:  Head: Normocephalic and atraumatic.  Eyes: Pupils are equal, round, and reactive to light. EOM are normal.  Neck: Normal range of motion. Neck supple. No JVD present. No thyromegaly present.  Cardiovascular: Normal rate.  Murmur heard. Irregular heart beats.   Pulmonary/Chest: She has no wheezes. She has no  rales.  Abdominal: Soft. Bowel sounds are normal.  Musculoskeletal: She exhibits edema and tenderness.  Lower back spinous process tenderness at lumbar spine. Trace edema BLE  Neurological: She is alert. No cranial nerve deficit. She exhibits normal muscle tone. Coordination normal.  Oriented to person and place.   Skin: Skin is warm and dry. She is not diaphoretic.  Psychiatric: She has a normal mood and affect. Her behavior is normal.    Labs reviewed: Recent Labs    01/26/17 1317  10/18/17 12/11/17 0807 12/13/17  NA 136   < > 142 139 137  K 4.0   < > 3.6 3.2* 3.4  CL 100*  --   --  95* 93  CO2 26  --   --  35* 34  GLUCOSE 104*  --   --  119*  --   BUN 15   < > 30* 30* 36*  CREATININE 1.01*   < > 1.2* 1.23* 1.3*  CALCIUM 9.4  --   --  10.0 9.9   < > = values in this interval not displayed.   Recent Labs    04/26/17 07/12/17 10/18/17  AST 15 22 17   ALT 8 12 10  ALKPHOS 34 39 31   Recent Labs    01/26/17 1317  10/18/17 12/05/17 12/11/17 0625  WBC 7.5   < > 8.0 8.8 10.1  NEUTROABS 4.8  --   --  4,928 7.4  HGB 13.0   < > 12.6 12.4 14.2  HCT 39.3   < > 36 36 42.0  MCV 90.1  --   --   --  90.3  PLT 218   < > 182 188 247   < > = values in this interval not displayed.   Lab Results  Component Value Date   TSH 0.90 07/12/2017   No results found for: HGBA1C No results found for: CHOL, HDL, LDLCALC, LDLDIRECT, TRIG, CHOLHDL  Significant Diagnostic Results in last 30 days:  Ct Head Wo Contrast  Result Date: 12/11/2017 CLINICAL DATA:  84 year old female presents with un witnessed fall and increased confusion over the past 3 days. Abrasion nose and left-side of face. Baseline dementia. On Coumadin. Initial encounter. EXAM: CT HEAD WITHOUT CONTRAST CT MAXILLOFACIAL WITHOUT CONTRAST CT CERVICAL SPINE WITHOUT CONTRAST TECHNIQUE: Multidetector CT imaging of the head, cervical spine, and maxillofacial structures were performed using the standard protocol without intravenous  contrast. Multiplanar CT image reconstructions of the cervical spine and maxillofacial structures were also generated. COMPARISON:  07/04/2016 head and cervical spine CT. FINDINGS: CT HEAD FINDINGS Brain: No intracranial hemorrhage or CT evidence of large acute infarct. Prominent chronic microvascular changes. Global atrophy. No intracranial mass lesion noted on this unenhanced exam. Vascular: Vascular calcifications Skull: No skull fracture Other: No acute orbital abnormality. CT MAXILLOFACIAL FINDINGS Osseous: Inferior aspect of the chin is not included on present exam. No facial fracture otherwise noted. Orbits: No acute orbital abnormality. Sinuses: Minimal mucosal thickening left maxillary sinus. Soft tissues: Negative CT CERVICAL SPINE FINDINGS Alignment: Curvature convex left unchanged from prior exam. Skull base and vertebrae: No cervical spine fracture noted. Soft tissues and spinal canal: No abnormal prevertebral soft tissue swelling or high-grade spinal stenosis. Disc levels:  Mild degenerative changes most notable C2-3. Upper chest: No worrisome lung apical lesion Other: Carotid bifurcation calcifications. IMPRESSION: No skull fracture or intracranial hemorrhage. Inferior aspect of the chin is not included on present exam. No facial fracture otherwise noted. Curvature cervical spine convex left unchanged. No cervical spine fracture or abnormal prevertebral soft tissue swelling. Chronic changes otherwise as noted above. Electronically Signed   By: Lacy Duverney M.D.   On: 12/11/2017 10:36   Ct Cervical Spine Wo Contrast  Result Date: 12/11/2017 CLINICAL DATA:  84 year old female presents with un witnessed fall and increased confusion over the past 3 days. Abrasion nose and left-side of face. Baseline dementia. On Coumadin. Initial encounter. EXAM: CT HEAD WITHOUT CONTRAST CT MAXILLOFACIAL WITHOUT CONTRAST CT CERVICAL SPINE WITHOUT CONTRAST TECHNIQUE: Multidetector CT imaging of the head, cervical  spine, and maxillofacial structures were performed using the standard protocol without intravenous contrast. Multiplanar CT image reconstructions of the cervical spine and maxillofacial structures were also generated. COMPARISON:  07/04/2016 head and cervical spine CT. FINDINGS: CT HEAD FINDINGS Brain: No intracranial hemorrhage or CT evidence of large acute infarct. Prominent chronic microvascular changes. Global atrophy. No intracranial mass lesion noted on this unenhanced exam. Vascular: Vascular calcifications Skull: No skull fracture Other: No acute orbital abnormality. CT MAXILLOFACIAL FINDINGS Osseous: Inferior aspect of the chin is not included on present exam. No facial fracture otherwise noted. Orbits: No acute orbital abnormality. Sinuses: Minimal mucosal thickening left maxillary sinus. Soft tissues: Negative CT CERVICAL SPINE FINDINGS  Alignment: Curvature convex left unchanged from prior exam. Skull base and vertebrae: No cervical spine fracture noted. Soft tissues and spinal canal: No abnormal prevertebral soft tissue swelling or high-grade spinal stenosis. Disc levels:  Mild degenerative changes most notable C2-3. Upper chest: No worrisome lung apical lesion Other: Carotid bifurcation calcifications. IMPRESSION: No skull fracture or intracranial hemorrhage. Inferior aspect of the chin is not included on present exam. No facial fracture otherwise noted. Curvature cervical spine convex left unchanged. No cervical spine fracture or abnormal prevertebral soft tissue swelling. Chronic changes otherwise as noted above. Electronically Signed   By: Lacy Duverney M.D.   On: 12/11/2017 10:36   Ct Maxillofacial Wo Contrast  Result Date: 12/11/2017 CLINICAL DATA:  84 year old female presents with un witnessed fall and increased confusion over the past 3 days. Abrasion nose and left-side of face. Baseline dementia. On Coumadin. Initial encounter. EXAM: CT HEAD WITHOUT CONTRAST CT MAXILLOFACIAL WITHOUT CONTRAST  CT CERVICAL SPINE WITHOUT CONTRAST TECHNIQUE: Multidetector CT imaging of the head, cervical spine, and maxillofacial structures were performed using the standard protocol without intravenous contrast. Multiplanar CT image reconstructions of the cervical spine and maxillofacial structures were also generated. COMPARISON:  07/04/2016 head and cervical spine CT. FINDINGS: CT HEAD FINDINGS Brain: No intracranial hemorrhage or CT evidence of large acute infarct. Prominent chronic microvascular changes. Global atrophy. No intracranial mass lesion noted on this unenhanced exam. Vascular: Vascular calcifications Skull: No skull fracture Other: No acute orbital abnormality. CT MAXILLOFACIAL FINDINGS Osseous: Inferior aspect of the chin is not included on present exam. No facial fracture otherwise noted. Orbits: No acute orbital abnormality. Sinuses: Minimal mucosal thickening left maxillary sinus. Soft tissues: Negative CT CERVICAL SPINE FINDINGS Alignment: Curvature convex left unchanged from prior exam. Skull base and vertebrae: No cervical spine fracture noted. Soft tissues and spinal canal: No abnormal prevertebral soft tissue swelling or high-grade spinal stenosis. Disc levels:  Mild degenerative changes most notable C2-3. Upper chest: No worrisome lung apical lesion Other: Carotid bifurcation calcifications. IMPRESSION: No skull fracture or intracranial hemorrhage. Inferior aspect of the chin is not included on present exam. No facial fracture otherwise noted. Curvature cervical spine convex left unchanged. No cervical spine fracture or abnormal prevertebral soft tissue swelling. Chronic changes otherwise as noted above. Electronically Signed   By: Lacy Duverney M.D.   On: 12/11/2017 10:36    Assessment/Plan Acute bilateral low back pain acute onset lower back pain x 2-3 days, worsened with movement, not traveling down to buttocks or legs, able to sleep at night, ambulates with walker. No recollection of form of  injury. Will apply Lidoderm patch on 12hr off 12hr x 2 weeks, Tylenol 650mg  q8h po x 1week, referral to Ortho for further evaluation and treatment. Update CBC/diff  Mixed Alzheimer's and vascular dementia Continue to assist the patient with ADLs in [facility]. Close supervision for safety, she is at risk for falls.   Hypokalemia Stable, last K 3.4 12/13/17, it was 3.2 12/11/17, 3.6 10/18/17, update CMP  CKD (chronic kidney disease) stage 3, GFR 30-59 ml/min (HCC) Stage 3, last creat 1.3 12/13/17.      Family/ staff Communication: plan of care reviewed with the patient and charge nurse.   Labs/tests ordered:  CBC/diff, CMP  Time spend 25 minutes.

## 2018-01-08 NOTE — Assessment & Plan Note (Addendum)
Stable, last K 3.4 12/13/17, it was 3.2 12/11/17, 3.6 10/18/17, update CMP

## 2018-01-08 NOTE — Assessment & Plan Note (Signed)
Stage 3, last creat 1.3 12/13/17.

## 2018-01-08 NOTE — Assessment & Plan Note (Addendum)
acute onset lower back pain x 2-3 days, worsened with movement, not traveling down to buttocks or legs, able to sleep at night, ambulates with walker. No recollection of form of injury. Will apply Lidoderm patch on 12hr off 12hr x 2 weeks, Tylenol 650mg  q8h po x 1week, referral to Ortho for further evaluation and treatment. Update CBC/diff

## 2018-01-08 NOTE — Assessment & Plan Note (Signed)
Continue to assist the patient with ADLs in [facility]. Close supervision for safety, she is at risk for falls.

## 2018-01-09 DIAGNOSIS — I4891 Unspecified atrial fibrillation: Secondary | ICD-10-CM | POA: Diagnosis not present

## 2018-01-09 DIAGNOSIS — Z7901 Long term (current) use of anticoagulants: Secondary | ICD-10-CM | POA: Diagnosis not present

## 2018-01-09 DIAGNOSIS — M545 Low back pain: Secondary | ICD-10-CM | POA: Diagnosis not present

## 2018-01-09 DIAGNOSIS — E871 Hypo-osmolality and hyponatremia: Secondary | ICD-10-CM | POA: Diagnosis not present

## 2018-01-09 LAB — PROTIME-INR: Protime: 36 — AB (ref 10.0–13.8)

## 2018-01-09 LAB — CBC AND DIFFERENTIAL
HCT: 37 (ref 36–46)
HEMOGLOBIN: 12.7 (ref 12.0–16.0)
PLATELETS: 194 (ref 150–399)
WBC: 7.2

## 2018-01-09 LAB — HEPATIC FUNCTION PANEL
ALK PHOS: 42 (ref 25–125)
ALT: 8 (ref 7–35)
AST: 16 (ref 13–35)
BILIRUBIN, TOTAL: 0.6

## 2018-01-09 LAB — BASIC METABOLIC PANEL
BUN: 31 — AB (ref 4–21)
Creatinine: 1.2 — AB (ref <=1.1)
Glucose: 86
Potassium: 3.2 — AB (ref 3.4–5.3)
Sodium: 139 (ref 137–147)

## 2018-01-09 LAB — POCT INR: INR: 3.4 — AB (ref <=1.1)

## 2018-01-10 ENCOUNTER — Other Ambulatory Visit: Payer: Self-pay | Admitting: *Deleted

## 2018-01-10 DIAGNOSIS — Z9181 History of falling: Secondary | ICD-10-CM | POA: Diagnosis not present

## 2018-01-10 DIAGNOSIS — Z7901 Long term (current) use of anticoagulants: Secondary | ICD-10-CM | POA: Diagnosis not present

## 2018-01-10 DIAGNOSIS — I4891 Unspecified atrial fibrillation: Secondary | ICD-10-CM | POA: Diagnosis not present

## 2018-01-10 DIAGNOSIS — N390 Urinary tract infection, site not specified: Secondary | ICD-10-CM | POA: Diagnosis not present

## 2018-01-10 DIAGNOSIS — R251 Tremor, unspecified: Secondary | ICD-10-CM | POA: Diagnosis not present

## 2018-01-10 DIAGNOSIS — Z7389 Other problems related to life management difficulty: Secondary | ICD-10-CM | POA: Diagnosis not present

## 2018-01-10 DIAGNOSIS — N3946 Mixed incontinence: Secondary | ICD-10-CM | POA: Diagnosis not present

## 2018-01-10 DIAGNOSIS — M6281 Muscle weakness (generalized): Secondary | ICD-10-CM | POA: Diagnosis not present

## 2018-01-10 LAB — COMPLETE METABOLIC PANEL WITH GFR
Albumin: 3.5
Calcium: 9.6
Carbon Dioxide, Total: 34
Chloride: 98
GFR CALC NON AF AMER: 40
GLOBULIN: 2.5
PROTEIN: 6

## 2018-01-11 DIAGNOSIS — N3946 Mixed incontinence: Secondary | ICD-10-CM | POA: Diagnosis not present

## 2018-01-11 DIAGNOSIS — M6281 Muscle weakness (generalized): Secondary | ICD-10-CM | POA: Diagnosis not present

## 2018-01-11 DIAGNOSIS — Z7389 Other problems related to life management difficulty: Secondary | ICD-10-CM | POA: Diagnosis not present

## 2018-01-11 DIAGNOSIS — N390 Urinary tract infection, site not specified: Secondary | ICD-10-CM | POA: Diagnosis not present

## 2018-01-11 DIAGNOSIS — Z9181 History of falling: Secondary | ICD-10-CM | POA: Diagnosis not present

## 2018-01-15 DIAGNOSIS — D689 Coagulation defect, unspecified: Secondary | ICD-10-CM | POA: Diagnosis not present

## 2018-01-15 DIAGNOSIS — Z7901 Long term (current) use of anticoagulants: Secondary | ICD-10-CM | POA: Diagnosis not present

## 2018-01-15 LAB — POCT INR: INR: 2.1 — AB (ref <=1.1)

## 2018-01-15 LAB — PROTIME-INR: Protime: 22.3 — AB (ref 10.0–13.8)

## 2018-01-16 ENCOUNTER — Non-Acute Institutional Stay: Payer: Medicare Other | Admitting: Nurse Practitioner

## 2018-01-16 ENCOUNTER — Ambulatory Visit (INDEPENDENT_AMBULATORY_CARE_PROVIDER_SITE_OTHER): Payer: Medicare Other | Admitting: Orthopedic Surgery

## 2018-01-16 ENCOUNTER — Encounter: Payer: Self-pay | Admitting: Nurse Practitioner

## 2018-01-16 ENCOUNTER — Encounter (INDEPENDENT_AMBULATORY_CARE_PROVIDER_SITE_OTHER): Payer: Self-pay | Admitting: Orthopedic Surgery

## 2018-01-16 ENCOUNTER — Other Ambulatory Visit: Payer: Self-pay | Admitting: *Deleted

## 2018-01-16 DIAGNOSIS — M545 Low back pain, unspecified: Secondary | ICD-10-CM

## 2018-01-16 DIAGNOSIS — I482 Chronic atrial fibrillation, unspecified: Secondary | ICD-10-CM

## 2018-01-16 DIAGNOSIS — G8929 Other chronic pain: Secondary | ICD-10-CM

## 2018-01-16 DIAGNOSIS — F0150 Vascular dementia without behavioral disturbance: Secondary | ICD-10-CM

## 2018-01-16 DIAGNOSIS — E876 Hypokalemia: Secondary | ICD-10-CM | POA: Diagnosis not present

## 2018-01-16 DIAGNOSIS — F0280 Dementia in other diseases classified elsewhere without behavioral disturbance: Secondary | ICD-10-CM | POA: Diagnosis not present

## 2018-01-16 DIAGNOSIS — G309 Alzheimer's disease, unspecified: Secondary | ICD-10-CM

## 2018-01-16 DIAGNOSIS — I10 Essential (primary) hypertension: Secondary | ICD-10-CM

## 2018-01-16 DIAGNOSIS — G250 Essential tremor: Secondary | ICD-10-CM | POA: Diagnosis not present

## 2018-01-16 NOTE — Assessment & Plan Note (Signed)
11/09/17 cardiology, EKG chronic Afib, heart rate is in control

## 2018-01-16 NOTE — Assessment & Plan Note (Signed)
01/09/18 K 3.2, continue Kcl 10meq po daily, repeat BMP in one week.

## 2018-01-16 NOTE — Assessment & Plan Note (Signed)
Blood pressure is controlled, continue Propranolol, chlorthalidone. Trace edema seen in ankles.

## 2018-01-16 NOTE — Assessment & Plan Note (Signed)
Fine resting tremor in fingers, not disabling, continue Propranolol.

## 2018-01-16 NOTE — Progress Notes (Signed)
Location:  [facility] Room Number: 806 Place of Service:  ALF [phone]) Provider:  Antolin Belsito, Arna Snipe  NP  Oneal Grout, MD  Patient Care Team: Oneal Grout, MD as PCP - General (Internal Medicine) Nahser, Deloris Ping, MD as PCP - Cardiology (Cardiology)  Extended Emergency Contact Information Primary Emergency Contact: Morgan,Tom Address: [address]          [place], [place] [address] [place] of [place] Home Phone: [phone] Relation: Friend Secondary Emergency Contact: Brandy Hale [place] of [place] Home Phone: [phone] Relation: Niece  Code Status:  DNR Goals of care: Advanced Directive information Advanced Directives 01/08/2018  Does Patient Have a Medical Advance Directive? Yes  Type of Estate agent of [place];Out of facility DNR (pink MOST or yellow form)  Does patient want to make changes to medical advance directive? No - Patient declined  Copy of Healthcare Power of Attorney in Chart? Yes  Would patient like information on creating a medical advance directive? -  Pre-existing out of facility DNR order (yellow form or pink MOST form) Yellow form placed in chart (order not valid for inpatient use)     Chief Complaint  Patient presents with  . Medical Management of Chronic Issues    F/U- Alzheimers, low back pain, CKD    HPI:  Pt is a 84 y.o. female seen today for medical management of chronic diseases.    The patient has chronic Afib, heart rate is in control, taking Propranolol 80mg  daily, her fine resting tremor in fingers is not disabling. She is on anticoagulation therapy for thromboembolic risk reduction, INR is therapeutic today 2.1, on Coumadin 2.5mg  qd. Trace edema seen in ankles, on Chlorthalidone 25mg  qd, Kcl daily, last K 3.2 01/09/18. Her lowe back pain is improved, completed 1 wk course ofTylenol 650mg  q8h started 01/08/18, will complete 2 week course of Lidoderm patch, had Ortho  consultation this morning. Hx of HTN, blood pressure is in control.   Past Medical History:  Diagnosis Date  . Anticoagulant long-term use   . Atrial fibrillation (HCC)   . CHF (congestive heart failure) (HCC)   . Chronic anticoagulation   . Hypertension   . Raynaud phenomenon   . Tremor, essential    Past Surgical History:  Procedure Laterality Date  . CHOLECYSTECTOMY    . TONSILLECTOMY      Allergies  Allergen Reactions  . Fosamax [Alendronate]   . Other Nausea And Vomiting    Green pepper    Outpatient Encounter Medications as of 01/16/2018  Medication Sig  . Calcium Carbonate-Vit D-Min (CALTRATE 600+D PLUS MINERALS) 600-800 MG-UNIT TABS Take 1 tablet by mouth daily.  . Camphor-Eucalyptus-Menthol (VICKS VAPORUB EX) Apply topically 2 (two) times daily as needed (for congestion).  . chlorthalidone (HYGROTON) 25 MG tablet Take 25 mg by mouth daily.   Marland Kitchen gabapentin (NEURONTIN) 100 MG capsule Take 100 mg by mouth daily as needed (for neuropathy).   . lactose free nutrition (BOOST) LIQD Take 237 mLs by mouth 2 (two) times daily between meals.  . NON FORMULARY Lidoderm topical 4% patch -APPLY 1 4% PATCH TO (LOWERBACK)EVERY 12 HRS. ON AM/OFF PM  . potassium chloride (K-DUR) 10 MEQ tablet Take 10 mEq by mouth daily.  . propranolol ER (INDERAL LA) 80 MG 24 hr capsule TAKE 1 CAPSULE BY MOUTH  DAILY  . warfarin (COUMADIN) 2.5 MG tablet Take 2.5 mg by mouth daily.  . [DISCONTINUED] polyvinyl alcohol (LIQUIFILM TEARS) 1.4 % ophthalmic  solution Place 1 drop into both eyes 3 (three) times daily.  . [DISCONTINUED] potassium chloride (K-DUR) 10 MEQ tablet Take 1 tablet (10 mEq total) by mouth daily.  . [DISCONTINUED] warfarin (COUMADIN) 3 MG tablet Take 3 mg by mouth See admin instructions. Take Mon, Tues, Wed, Fri.   No facility-administered encounter medications on file as of 01/16/2018.     Review of Systems  Constitutional: Negative for activity change, appetite change, chills,  diaphoresis, fatigue and fever.  HENT: Positive for hearing loss. Negative for congestion, trouble swallowing and voice change.   Respiratory: Negative for cough, shortness of breath and wheezing.   Cardiovascular: Positive for leg swelling. Negative for chest pain and palpitations.  Gastrointestinal: Negative for abdominal distention, abdominal pain, constipation, diarrhea, nausea and vomiting.  Genitourinary: Negative for difficulty urinating, dysuria and urgency.  Musculoskeletal: Positive for gait problem. Negative for arthralgias and back pain.  Skin: Negative for color change and pallor.  Neurological: Positive for tremors. Negative for dizziness, speech difficulty, weakness and headaches.       Memory lapses  Psychiatric/Behavioral: Positive for confusion. Negative for agitation, behavioral problems, hallucinations and sleep disturbance. The patient is not nervous/anxious.     Immunization History  Administered Date(s) Administered  . Influenza-Unspecified 05/28/2017  . Pneumococcal Conjugate-13 07/13/2017  . Tdap 07/13/2017   Pertinent  Health Maintenance Due  Topic Date Due  . INFLUENZA VACCINE  03/07/2018  . PNA vac Low Risk Adult (2 of 2 - PPSV23) 07/13/2018  . DEXA SCAN  Completed   Fall Risk  10/05/2017  Falls in the past year? No   Functional Status Survey:    Vitals:   01/16/18 1329  BP: 124/70  Pulse: 68  Resp: 18  Temp: 99 F (37.2 C)  SpO2: 93%  Weight: 128 lb (58.1 kg)  Height: 5\' 4"  (1.626 m)   Body mass index is 21.97 kg/m. Physical Exam  Constitutional: She appears well-developed and well-nourished.  HENT:  Head: Normocephalic and atraumatic.  Eyes: Pupils are equal, round, and reactive to light. EOM are normal.  Neck: Normal range of motion. Neck supple. No JVD present. No thyromegaly present.  Cardiovascular: Normal rate.  Murmur heard. Irregular heart beats.   Pulmonary/Chest: Effort normal and breath sounds normal. She has no wheezes.    Abdominal: Soft. Bowel sounds are normal. She exhibits no distension. There is no tenderness.  Musculoskeletal: She exhibits edema. She exhibits no tenderness.  Trace edema ankles, self transfer, ambulates with walker.   Neurological: She is alert. No cranial nerve deficit. She exhibits normal muscle tone. Coordination normal.  Oriented to person and place  Skin: Skin is warm and dry.  Psychiatric: She has a normal mood and affect. Her behavior is normal.    Labs reviewed: Recent Labs    01/26/17 1317  12/11/17 0807 12/13/17 01/09/18  NA 136   < > 139 137 139  K 4.0   < > 3.2* 3.4 3.2*  CL 100*  --  95* 93 98  CO2 26  --  35* 34 34  GLUCOSE 104*  --  119*  --   --   BUN 15   < > 30* 36* 31*  CREATININE 1.01*   < > 1.23* 1.3* 1.2*  CALCIUM 9.4  --  10.0 9.9 9.6   < > = values in this interval not displayed.   Recent Labs    07/12/17 10/18/17 01/09/18  AST 22 17 16   ALT 12 10 8   ALKPHOS 39  31 42  ALBUMIN  --   --  3.5   Recent Labs    01/26/17 1317  12/05/17 12/11/17 0625 01/09/18  WBC 7.5   < > 8.8 10.1 7.2  NEUTROABS 4.8  --  4,928 7.4  --   HGB 13.0   < > 12.4 14.2 12.7  HCT 39.3   < > 36 42.0 37  MCV 90.1  --   --  90.3  --   PLT 218   < > 188 247 194   < > = values in this interval not displayed.   Lab Results  Component Value Date   TSH 0.90 07/12/2017   No results found for: HGBA1C No results found for: CHOL, HDL, LDLCALC, LDLDIRECT, TRIG, CHOLHDL  Significant Diagnostic Results in last 30 days:  No results found.  Assessment/Plan Hypokalemia 01/09/18 K 3.2, continue Kcl po daily, repeat BMP in one week.   Mixed Alzheimer's and vascular dementia Functioning well in [facility], she transfers self, ambulates with walker.   Chronic a-fib (HCC) 11/09/17 cardiology, EKG chronic Afib, heart rate is in control  Tremor, essential Fine resting tremor in fingers, not disabling, continue Propranolol.   Acute bilateral low back pain Improved, complete 2  week course of Lidoderm started 01/08/18, underwent Ortho evaluation today. Observe.   Hypertension Blood pressure is controlled, continue Propranolol, chlorthalidone. Trace edema seen in ankles.      Family/ staff Communication: plan of care reviewed with the patient and charge nurse.   Labs/tests ordered:  BMP  Time spend 25 minutes.

## 2018-01-16 NOTE — Assessment & Plan Note (Signed)
Functioning well in [facility], she transfers self, ambulates with walker.

## 2018-01-16 NOTE — Progress Notes (Signed)
Office Visit Note   Patient: Angelica Mejia           Date of Birth: 06-Dec-1918           MRN: [id number] Visit Date: 01/16/2018 Requested by: Oneal Grout, MD [address] [place], [place] [address] PCP: Oneal Grout, MD  Subjective: Chief Complaint  Patient presents with  . Lower Back - Pain    HPI: Angelica Mejia is a patient with low back pain which was worse several weeks ago.  She has a report from radiographs of the lumbar spine done last week which shows 30% compression fracture above previously vertebroplasty need vertebral body at L3.  She states that she is getting better and does not want to use the lidocaine patch anymore.  She is been ambulating with a walker.  Denies any leg symptoms or radicular symptoms.              ROS: All systems reviewed are negative as they relate to the chief complaint within the history of present illness.  Patient denies  fevers or chills.   Assessment & Plan: Visit Diagnoses:  1. Chronic bilateral low back pain without sciatica     Plan: Impression is bilateral low back pain with no real symptoms currently.  She does have some bruising around the back but functionally she is doing well.  She states is not hurting her as much as the other vertebral fracture was that she had to receive vertebroplasty for.  I will see her back as needed.  Follow-Up Instructions: Return if symptoms worsen or fail to improve.   Orders:  No orders of the defined types were placed in this encounter.  No orders of the defined types were placed in this encounter.     Procedures: No procedures performed   Clinical Data: No additional findings.  Objective: Vital Signs: There were no vitals taken for this visit.  Physical Exam:   Constitutional: Patient appears well-developed HEENT:  Head: Normocephalic Eyes:EOM are normal Neck: Normal range of motion Cardiovascular: Normal rate Pulmonary/chest: Effort normal Neurologic: Patient is alert Skin: Skin  is warm Psychiatric: Patient has normal mood and affect    Ortho Exam: Ortho exam demonstrates full active and passive range of motion of hips ankles and knees.  No nerve root tension signs.  No paresthesias L1 S1 bilaterally.  Pedal pulses palpable.  Negative clonus bilaterally.  She does have a little bit of bruising on her back area in the thoracolumbar region.  Not much in the way of pain with forward and lateral bending or tenderness to direct palpation of the spinous processes.  Specialty Comments:  No specialty comments available.  Imaging: No results found.   PMFS History: Patient Active Problem List   Diagnosis Date Noted  . Acute bilateral low back pain 01/08/2018  . Fall 12/12/2017  . Urinary incontinence 12/04/2017  . Dry eye syndrome of both eyes 10/22/2017  . Mixed Alzheimer's and vascular dementia 10/16/2017  . CKD (chronic kidney disease) stage 3, GFR 30-59 ml/min (HCC) 10/09/2017  . Neuropathy 10/09/2017  . Hypokalemia 10/09/2017  . Anemia, chronic disease 07/11/2017  . History of fracture of clavicle 05/16/2017  . Encounter for therapeutic drug monitoring 04/19/2017  . Paroxysmal atrial fibrillation (HCC)   . Tremor, essential   . Chronic anticoagulation   . Hypertension   . Raynaud phenomenon    Past Medical History:  Diagnosis Date  . Anticoagulant long-term use   . Atrial fibrillation (HCC)   .  CHF (congestive heart failure) (HCC)   . Chronic anticoagulation   . Hypertension   . Raynaud phenomenon   . Tremor, essential     Family History  Problem Relation Age of Onset  . Hypertension Father   . Heart attack Father   . Heart disease Unknown   . Hypertension Unknown   . Stroke Unknown   . Cancer Unknown     Past Surgical History:  Procedure Laterality Date  . CHOLECYSTECTOMY    . TONSILLECTOMY     Social History   Occupational History  . Occupation: Retired- Automotive engineerChristian education  Tobacco Use  . Smoking status: Never Smoker  . Smokeless  tobacco: Never Used  Substance and Sexual Activity  . Alcohol use: No  . Drug use: No  . Sexual activity: Not on file

## 2018-01-16 NOTE — Assessment & Plan Note (Signed)
Improved, complete 2 week course of Lidoderm started 01/08/18, underwent Ortho evaluation today. Observe.

## 2018-01-22 ENCOUNTER — Other Ambulatory Visit: Payer: Self-pay | Admitting: *Deleted

## 2018-01-22 DIAGNOSIS — E876 Hypokalemia: Secondary | ICD-10-CM | POA: Diagnosis not present

## 2018-01-22 LAB — BASIC METABOLIC PANEL
BUN: 33 — AB (ref 4–21)
CREATININE: 1.2 — AB (ref <=1.1)
Calcium: 10
Carbon Dioxide, Total: 34
Chloride: 98
EGFR (Non-African Amer.): 39
Glucose: 127
POTASSIUM: 3.3 — AB (ref 3.4–5.3)
SODIUM: 141 (ref 137–147)

## 2018-01-29 DIAGNOSIS — Z7901 Long term (current) use of anticoagulants: Secondary | ICD-10-CM | POA: Diagnosis not present

## 2018-01-29 DIAGNOSIS — E871 Hypo-osmolality and hyponatremia: Secondary | ICD-10-CM | POA: Diagnosis not present

## 2018-01-29 DIAGNOSIS — I5020 Unspecified systolic (congestive) heart failure: Secondary | ICD-10-CM | POA: Diagnosis not present

## 2018-01-29 DIAGNOSIS — E876 Hypokalemia: Secondary | ICD-10-CM | POA: Diagnosis not present

## 2018-01-29 LAB — BASIC METABOLIC PANEL
BUN: 33 — AB (ref 4–21)
CREATININE: 1.2 — AB (ref <=1.1)
Glucose: 96
Potassium: 3.7 (ref 3.4–5.3)
Sodium: 141 (ref 137–147)

## 2018-01-29 LAB — PROTIME-INR: PROTIME: 21.3 — AB (ref 10.0–13.8)

## 2018-01-29 LAB — POCT INR: INR: 2 — AB (ref <=1.1)

## 2018-01-30 ENCOUNTER — Other Ambulatory Visit: Payer: Self-pay | Admitting: *Deleted

## 2018-01-30 LAB — BASIC METABOLIC PANEL
CALCIUM: 9.7
CHLORIDE: 100
CO2: 33
EGFR (Non-African Amer.): 40
MAGNESIUM: 1.7

## 2018-02-12 DIAGNOSIS — I4891 Unspecified atrial fibrillation: Secondary | ICD-10-CM | POA: Diagnosis not present

## 2018-02-12 LAB — POCT INR: INR: 1.9 — AB (ref 0.9–1.1)

## 2018-02-12 LAB — PROTIME-INR: Protime: 20.2 — AB (ref 10.0–13.8)

## 2018-02-19 DIAGNOSIS — I4891 Unspecified atrial fibrillation: Secondary | ICD-10-CM | POA: Diagnosis not present

## 2018-02-19 LAB — PROTIME-INR: Protime: 17 — AB (ref 10.0–13.8)

## 2018-02-19 LAB — POCT INR: INR: 1.6 — AB (ref 0.9–1.1)

## 2018-02-28 DIAGNOSIS — I4891 Unspecified atrial fibrillation: Secondary | ICD-10-CM | POA: Diagnosis not present

## 2018-02-28 LAB — POCT INR: INR: 1.6 — AB (ref 0.9–1.1)

## 2018-02-28 LAB — PROTIME-INR: PROTIME: 17 — AB (ref 10.0–13.8)

## 2018-03-05 DIAGNOSIS — I4891 Unspecified atrial fibrillation: Secondary | ICD-10-CM | POA: Diagnosis not present

## 2018-03-05 LAB — PROTIME-INR: Protime: 18.4 — AB (ref 10.0–13.8)

## 2018-03-05 LAB — POCT INR: INR: 1.7 — AB (ref 0.9–1.1)

## 2018-03-13 DIAGNOSIS — I4891 Unspecified atrial fibrillation: Secondary | ICD-10-CM | POA: Diagnosis not present

## 2018-03-19 DIAGNOSIS — I4891 Unspecified atrial fibrillation: Secondary | ICD-10-CM | POA: Diagnosis not present

## 2018-03-20 ENCOUNTER — Encounter: Payer: Self-pay | Admitting: Internal Medicine

## 2018-04-02 DIAGNOSIS — I10 Essential (primary) hypertension: Secondary | ICD-10-CM | POA: Diagnosis not present

## 2018-04-16 ENCOUNTER — Encounter: Payer: Self-pay | Admitting: Nurse Practitioner

## 2018-04-16 ENCOUNTER — Non-Acute Institutional Stay: Payer: Medicare Other | Admitting: Nurse Practitioner

## 2018-04-16 DIAGNOSIS — I10 Essential (primary) hypertension: Secondary | ICD-10-CM

## 2018-04-16 DIAGNOSIS — I482 Chronic atrial fibrillation, unspecified: Secondary | ICD-10-CM

## 2018-04-16 DIAGNOSIS — G309 Alzheimer's disease, unspecified: Secondary | ICD-10-CM

## 2018-04-16 DIAGNOSIS — R3915 Urgency of urination: Secondary | ICD-10-CM | POA: Diagnosis not present

## 2018-04-16 DIAGNOSIS — F0150 Vascular dementia without behavioral disturbance: Secondary | ICD-10-CM

## 2018-04-16 DIAGNOSIS — R350 Frequency of micturition: Secondary | ICD-10-CM | POA: Insufficient documentation

## 2018-04-16 DIAGNOSIS — F0280 Dementia in other diseases classified elsewhere without behavioral disturbance: Secondary | ICD-10-CM

## 2018-04-16 NOTE — Assessment & Plan Note (Signed)
New onset, will obtain UA C/S to r/o UTI.  

## 2018-04-16 NOTE — Progress Notes (Signed)
Location:   [facility] Room Number: 806 Place of Service:  ALF (13) Provider: Arna Snipe Kasha Howeth NP  Oneal Grout, MD  Patient Care Team: Oneal Grout, MD as PCP - General (Internal Medicine) Nahser, Deloris Ping, MD as PCP - Cardiology (Cardiology)  Extended Emergency Contact Information Primary Emergency Contact: Morgan,Tom Address: [address]          [place], [place] [address] [place] of [place] Home Phone: [phone] Relation: Friend Secondary Emergency Contact: Brandy [place] of [place] Home Phone: [phone] Relation: Niece  Code Status: DNR Goals of care: Advanced Directive information Advanced Directives 01/08/2018  Does Patient Have a Medical Advance Directive? Yes  Type of Estate agent of [place];Out of facility DNR (pink MOST or yellow form)  Does patient want to make changes to medical advance directive? No - Patient declined  Copy of Healthcare Power of Attorney in Chart? Yes  Would patient like information on creating a medical advance directive? -  Pre-existing out of facility DNR order (yellow form or pink MOST form) Yellow form placed in chart (order not valid for inpatient use)     Chief Complaint  Patient presents with  . Acute Visit    urinary urgency and frequency. Increased confustion    HPI:  Pt is a 84 y.o. female seen today for an acute visit for urinary urgency and frequency for 2-3 days, HPI was provided with assistance of staff due to the patient's dementia and increased confusion. She denied pain in lower abd or back, denied pain or burning sensation with urination. She is afebrile. She resides in [facility] for safety and care assistance. Hx of HTN, Afib, blood pressure and heart rate are in control while taking Propranolol 80mg  qd.    Past Medical History:  Diagnosis Date  . Anticoagulant long-term use   . Atrial fibrillation (HCC)   . CHF (congestive heart failure) (HCC)   . Chronic  anticoagulation   . Hypertension   . Raynaud phenomenon   . Tremor, essential    Past Surgical History:  Procedure Laterality Date  . CHOLECYSTECTOMY    . TONSILLECTOMY      Allergies  Allergen Reactions  . Fosamax [Alendronate]   . Other Nausea And Vomiting    Green pepper    Allergies as of 04/16/2018      Reactions   Fosamax [alendronate]    Other Nausea And Vomiting   Green pepper      Medication List        Accurate as of 04/16/18 11:59 PM. Always use your most recent med list.          CALTRATE 600+D PLUS MINERALS 600-800 MG-UNIT Tabs Take 1 tablet by mouth daily.   chlorthalidone 25 MG tablet Commonly known as:  HYGROTON Take 25 mg by mouth daily.   lactose free nutrition Liqd Take 237 mLs by mouth 2 (two) times daily between meals.   NON FORMULARY Lidoderm topical 4% patch -APPLY 1 4% PATCH TO (LOWERBACK)EVERY 12 HRS. ON AM/OFF PM   potassium chloride 10 MEQ tablet Commonly known as:  K-DUR Take 20 mEq by mouth daily.   propranolol ER 80 MG 24 hr capsule Commonly known as:  INDERAL LA TAKE 1 CAPSULE BY MOUTH  DAILY   VICKS VAPORUB EX Apply topically 2 (two) times daily as needed (for congestion).   warfarin 2.5 MG tablet Commonly known as:  COUMADIN Take 2.5 mg by mouth daily.  ROS was provided with assistance of staff Review of Systems  Constitutional: Positive for fatigue. Negative for activity change, appetite change, chills, diaphoresis and fever.  HENT: Positive for hearing loss. Negative for congestion and voice change.   Respiratory: Negative for cough, shortness of breath and wheezing.   Cardiovascular: Positive for leg swelling. Negative for chest pain and palpitations.  Gastrointestinal: Negative for abdominal distention, abdominal pain, constipation, diarrhea, nausea and vomiting.  Genitourinary: Positive for frequency and urgency. Negative for difficulty urinating, dysuria and hematuria.  Musculoskeletal: Positive for  arthralgias, back pain and gait problem.  Skin: Negative for color change and pallor.  Neurological: Positive for tremors. Negative for dizziness, speech difficulty, weakness and headaches.       Dementia  Psychiatric/Behavioral: Positive for behavioral problems and confusion. Negative for agitation, hallucinations and sleep disturbance. The patient is not nervous/anxious.     Immunization History  Administered Date(s) Administered  . Influenza-Unspecified 05/28/2017  . Pneumococcal Conjugate-13 07/13/2017  . Tdap 07/13/2017   Pertinent  Health Maintenance Due  Topic Date Due  . INFLUENZA VACCINE  03/07/2018  . PNA vac Low Risk Adult (2 of 2 - PPSV23) 07/13/2018  . DEXA SCAN  Completed   Fall Risk  10/05/2017  Falls in the past year? No   Functional Status Survey:    Vitals:   04/16/18 1357  BP: 130/70  Pulse: 76  Resp: 20  Temp: (!) 97.2 F (36.2 C)   There is no height or weight on file to calculate BMI. Physical Exam  Constitutional: She appears well-developed and well-nourished.  HENT:  Head: Normocephalic and atraumatic.  Eyes: Pupils are equal, round, and reactive to light. EOM are normal.  Neck: Normal range of motion. Neck supple. No JVD present. No thyromegaly present.  Cardiovascular: Normal rate.  Murmur heard. Irregular heart beats  Pulmonary/Chest: Breath sounds normal.  Abdominal: Soft. Bowel sounds are normal. She exhibits no distension. There is no tenderness. There is no rebound and no guarding.  Musculoskeletal: She exhibits edema.  Trace edema BLE. Ambulates with walker .  Neurological: She is alert. No cranial nerve deficit. She exhibits normal muscle tone. Coordination normal.  Oriented to person  Skin: Skin is warm and dry.  Psychiatric: She has a normal mood and affect.    Labs reviewed: Recent Labs    12/11/17 0807  01/09/18 01/22/18 01/29/18  NA 139   < > 139 141 141  K 3.2*   < > 3.2* 3.3* 3.7  CL 95*   < > 98 98 100  CO2 35*   < >  34 34 33  GLUCOSE 119*  --   --   --   --   BUN 30*   < > 31* 33* 33*  CREATININE 1.23*   < > 1.2* 1.2* 1.2*  CALCIUM 10.0   < > 9.6 10.0 9.7  MG  --   --   --   --  1.7   < > = values in this interval not displayed.   Recent Labs    07/12/17 10/18/17 01/09/18  AST 22 17 16   ALT 12 10 8   ALKPHOS 39 31 42  ALBUMIN  --   --  3.5   Recent Labs    12/05/17 12/11/17 0625 01/09/18  WBC 8.8 10.1 7.2  NEUTROABS 4,928 7.4  --   HGB 12.4 14.2 12.7  HCT 36 42.0 37  MCV  --  90.3  --   PLT 188 247 194  Lab Results  Component Value Date   TSH 0.90 07/12/2017   No results found for: HGBA1C No results found for: CHOL, HDL, LDLCALC, LDLDIRECT, TRIG, CHOLHDL  Significant Diagnostic Results in last 30 days:  No results found.  Assessment/Plan: Urinary urgency New onset, will obtain UA C/S to r/o UTI>  Urinary frequency New onset, will obtain UA C/S to r/o UTI  Mixed Alzheimer's and vascular dementia Reportedly increased confusion, no noted new focal neurological symptoms, update CBC/diff, CMP  Hypertension Controlled blood pressure, continue Propranolol.   Chronic a-fib (HCC) Heart rate is in control, continue Propranolol.     Family/ staff Communication: plan of care reviewed with the patient and charge nurse.   Labs/tests ordered: CBC/diff, CMP, UA C/S  Time spend 25 minutes.

## 2018-04-16 NOTE — Assessment & Plan Note (Signed)
Heart rate is in control, continue Propranolol.  

## 2018-04-16 NOTE — Assessment & Plan Note (Signed)
Controlled blood pressure, continue Propranolol.

## 2018-04-16 NOTE — Assessment & Plan Note (Signed)
Reportedly increased confusion, no noted new focal neurological symptoms, update CBC/diff, CMP

## 2018-04-17 DIAGNOSIS — I4891 Unspecified atrial fibrillation: Secondary | ICD-10-CM | POA: Diagnosis not present

## 2018-04-17 DIAGNOSIS — R350 Frequency of micturition: Secondary | ICD-10-CM | POA: Diagnosis not present

## 2018-04-17 DIAGNOSIS — G309 Alzheimer's disease, unspecified: Secondary | ICD-10-CM | POA: Diagnosis not present

## 2018-04-17 DIAGNOSIS — R3915 Urgency of urination: Secondary | ICD-10-CM | POA: Diagnosis not present

## 2018-04-17 LAB — CBC AND DIFFERENTIAL
HCT: 40 (ref 36–46)
HEMOGLOBIN: 13.2 (ref 12.0–16.0)
Platelets: 254 (ref 150–399)
WBC: 9.1

## 2018-04-17 LAB — HEPATIC FUNCTION PANEL
ALK PHOS: 37 (ref 25–125)
ALT: 9 (ref 7–35)
AST: 17 (ref 13–35)
BILIRUBIN, TOTAL: 0.4

## 2018-04-17 LAB — BASIC METABOLIC PANEL
BUN: 39 — AB (ref 4–21)
Creatinine: 1.4 — AB (ref <=1.1)
GLUCOSE: 82
POTASSIUM: 4 (ref 3.4–5.3)
SODIUM: 143 (ref 137–147)

## 2018-04-17 LAB — PROTIME-INR: Protime: 60.1 — AB (ref 10.0–13.8)

## 2018-04-17 LAB — POCT INR: INR: 5.8 — AB (ref <=1.1)

## 2018-04-18 ENCOUNTER — Other Ambulatory Visit: Payer: Self-pay | Admitting: *Deleted

## 2018-04-18 LAB — COMPREHENSIVE METABOLIC PANEL
ALBUMIN: 3.3
CALCIUM: 10.1
Carbon Dioxide, Total: 33
Chloride: 100
GLOBULIN: 2.3
TOTAL PROTEIN: 5.6 g/dL

## 2018-04-20 ENCOUNTER — Other Ambulatory Visit: Payer: Self-pay

## 2018-04-20 ENCOUNTER — Emergency Department (HOSPITAL_COMMUNITY)
Admission: EM | Admit: 2018-04-20 | Discharge: 2018-04-20 | Disposition: A | Discharge status: Home / Self Care | Payer: Medicare Other | Attending: Emergency Medicine | Admitting: Emergency Medicine

## 2018-04-20 ENCOUNTER — Emergency Department (HOSPITAL_COMMUNITY): Payer: Medicare Other

## 2018-04-20 ENCOUNTER — Encounter (HOSPITAL_COMMUNITY): Payer: Self-pay

## 2018-04-20 DIAGNOSIS — I4891 Unspecified atrial fibrillation: Secondary | ICD-10-CM | POA: Diagnosis not present

## 2018-04-20 DIAGNOSIS — I130 Hypertensive heart and chronic kidney disease with heart failure and stage 1 through stage 4 chronic kidney disease, or unspecified chronic kidney disease: Secondary | ICD-10-CM | POA: Diagnosis not present

## 2018-04-20 DIAGNOSIS — G308 Other Alzheimer's disease: Secondary | ICD-10-CM | POA: Diagnosis not present

## 2018-04-20 DIAGNOSIS — R531 Weakness: Secondary | ICD-10-CM | POA: Diagnosis not present

## 2018-04-20 DIAGNOSIS — Z79899 Other long term (current) drug therapy: Secondary | ICD-10-CM | POA: Diagnosis not present

## 2018-04-20 DIAGNOSIS — N390 Urinary tract infection, site not specified: Secondary | ICD-10-CM | POA: Diagnosis not present

## 2018-04-20 DIAGNOSIS — Z743 Need for continuous supervision: Secondary | ICD-10-CM | POA: Diagnosis not present

## 2018-04-20 DIAGNOSIS — Z7901 Long term (current) use of anticoagulants: Secondary | ICD-10-CM | POA: Insufficient documentation

## 2018-04-20 DIAGNOSIS — R279 Unspecified lack of coordination: Secondary | ICD-10-CM | POA: Diagnosis not present

## 2018-04-20 DIAGNOSIS — R410 Disorientation, unspecified: Secondary | ICD-10-CM | POA: Diagnosis not present

## 2018-04-20 DIAGNOSIS — R4182 Altered mental status, unspecified: Secondary | ICD-10-CM | POA: Diagnosis not present

## 2018-04-20 DIAGNOSIS — I509 Heart failure, unspecified: Secondary | ICD-10-CM | POA: Insufficient documentation

## 2018-04-20 DIAGNOSIS — N183 Chronic kidney disease, stage 3 (moderate): Secondary | ICD-10-CM | POA: Diagnosis not present

## 2018-04-20 DIAGNOSIS — F0280 Dementia in other diseases classified elsewhere without behavioral disturbance: Secondary | ICD-10-CM | POA: Insufficient documentation

## 2018-04-20 DIAGNOSIS — R404 Transient alteration of awareness: Secondary | ICD-10-CM | POA: Diagnosis not present

## 2018-04-20 DIAGNOSIS — I10 Essential (primary) hypertension: Secondary | ICD-10-CM | POA: Diagnosis not present

## 2018-04-20 LAB — COMPREHENSIVE METABOLIC PANEL
ALK PHOS: 47 U/L (ref 38–126)
ALT: 16 U/L (ref 0–44)
ANION GAP: 12 (ref 5–15)
AST: 24 U/L (ref 15–41)
Albumin: 4.1 g/dL (ref 3.5–5.0)
BILIRUBIN TOTAL: 0.7 mg/dL (ref 0.3–1.2)
BUN: 33 mg/dL — ABNORMAL HIGH (ref 8–23)
CALCIUM: 10.6 mg/dL — AB (ref 8.9–10.3)
CO2: 35 mmol/L — ABNORMAL HIGH (ref 22–32)
Chloride: 97 mmol/L — ABNORMAL LOW (ref 98–111)
Creatinine, Ser: 1.24 mg/dL — ABNORMAL HIGH (ref 0.44–1.00)
GFR calc non Af Amer: 35 mL/min — ABNORMAL LOW (ref >60)
GFR, EST AFRICAN AMERICAN: 41 mL/min — AB (ref >60)
GLUCOSE: 121 mg/dL — AB (ref 70–99)
Potassium: 3.3 mmol/L — ABNORMAL LOW (ref 3.5–5.1)
Sodium: 144 mmol/L (ref 135–145)
TOTAL PROTEIN: 7.9 g/dL (ref 6.5–8.1)

## 2018-04-20 LAB — PROTIME-INR
INR: 3.29
Prothrombin Time: 33.2 seconds — ABNORMAL HIGH (ref 11.4–15.2)

## 2018-04-20 LAB — DIFFERENTIAL
Basophils Absolute: 0 10*3/uL (ref 0.0–0.1)
Basophils Relative: 0 %
EOS PCT: 1 %
Eosinophils Absolute: 0.1 10*3/uL (ref 0.0–0.7)
LYMPHS ABS: 1.2 10*3/uL (ref 0.7–4.0)
LYMPHS PCT: 9 %
Monocytes Absolute: 0.8 10*3/uL (ref 0.1–1.0)
Monocytes Relative: 6 %
NEUTROS ABS: 10.7 10*3/uL — AB (ref 1.7–7.7)
Neutrophils Relative %: 84 %

## 2018-04-20 LAB — CBC
HEMATOCRIT: 46.7 % — AB (ref 36.0–46.0)
Hemoglobin: 15.5 g/dL — ABNORMAL HIGH (ref 12.0–15.0)
MCH: 30.3 pg (ref 26.0–34.0)
MCHC: 33.2 g/dL (ref 30.0–36.0)
MCV: 91.2 fL (ref 78.0–100.0)
Platelets: 269 10*3/uL (ref 150–400)
RBC: 5.12 MIL/uL — AB (ref 3.87–5.11)
RDW: 13.1 % (ref 11.5–15.5)
WBC: 12.8 10*3/uL — ABNORMAL HIGH (ref 4.0–10.5)

## 2018-04-20 LAB — CBG MONITORING, ED
GLUCOSE-CAPILLARY: 117 mg/dL — AB (ref 70–99)
Glucose-Capillary: 108 mg/dL — ABNORMAL HIGH (ref 70–99)

## 2018-04-20 LAB — I-STAT CG4 LACTIC ACID, ED: Lactic Acid, Venous: 2.18 mmol/L (ref 0.5–1.9)

## 2018-04-20 LAB — I-STAT TROPONIN, ED: TROPONIN I, POC: 0.01 ng/mL (ref 0.00–0.08)

## 2018-04-20 LAB — APTT: aPTT: 44 seconds — ABNORMAL HIGH (ref 24–36)

## 2018-04-20 MED ORDER — CEPHALEXIN 250 MG PO CAPS: 250.0000 mg | ORAL_CAPSULE | Freq: Four times a day (QID) | ORAL | 0 refills | Status: DC

## 2018-04-20 MED ORDER — SODIUM CHLORIDE 0.9 % IV BOLUS
500.0000 mL | Freq: Once | INTRAVENOUS | Status: AC
  Administered 2018-04-20: 500 mL via INTRAVENOUS

## 2018-04-20 NOTE — ED Notes (Signed)
Patient transported to CT 

## 2018-04-20 NOTE — ED Notes (Signed)
Report given to RN Meriam SpragueBeverly at facility.

## 2018-04-20 NOTE — ED Triage Notes (Signed)
[facility]. Pt brought in via [facility]. Pt was diagnosed with UTI yesterday and has received 2 doses of antibiotics and this morning facility states that patient has been intermittently difficult to arouse and that patient is AOx3 and ambulatory with 1 person assist currently. Normally pt is AOx4 and ambulatory w/o assistance.   PCP also discontinued Coumadin yesterday due to INR being 4.5.   Current Meds prescribed for UTI- Macrobid Nitrofurinate

## 2018-04-20 NOTE — ED Notes (Signed)
Bed: [id number] Expected date:  Expected time:  Means of arrival:  Comments: 84 yo AMS

## 2018-04-20 NOTE — ED Notes (Signed)
Ed provider Effie ShyWentz made aware patient has critical lactic acid value of 2.18

## 2018-04-20 NOTE — ED Provider Notes (Signed)
[facility]-EMERGENCY DEPT Provider Note   CSN: [id number] Arrival date & time: 04/20/18  1318     History   Chief Complaint Chief Complaint  Patient presents with  . Altered Mental Status    HPI Angelica Mejia is a 84 y.o. female.  HPI   She presents for evaluation of altered mental status, while currently being treated for UTI for 2 days at her nursing care facility.  The patient is unable to give any history.  Level 5 caveat-dementia  Past Medical History:  Diagnosis Date  . Anticoagulant long-term use   . Atrial fibrillation (HCC)   . CHF (congestive heart failure) (HCC)   . Chronic anticoagulation   . Hypertension   . Raynaud phenomenon   . Tremor, essential     Patient Active Problem List   Diagnosis Date Noted  . Urinary urgency 04/16/2018  . Urinary frequency 04/16/2018  . Acute bilateral low back pain 01/08/2018  . Fall 12/12/2017  . Urinary incontinence 12/04/2017  . Dry eye syndrome of both eyes 10/22/2017  . Mixed Alzheimer's and vascular dementia 10/16/2017  . CKD (chronic kidney disease) stage 3, GFR 30-59 ml/min (HCC) 10/09/2017  . Neuropathy 10/09/2017  . Hypokalemia 10/09/2017  . Anemia, chronic disease 07/11/2017  . History of fracture of clavicle 05/16/2017  . Encounter for therapeutic drug monitoring 04/19/2017  . Chronic a-fib (HCC)   . Tremor, essential   . Chronic anticoagulation   . Hypertension   . Raynaud phenomenon     Past Surgical History:  Procedure Laterality Date  . CHOLECYSTECTOMY    . TONSILLECTOMY       OB History   None      Home Medications    Prior to Admission medications   Medication Sig Start Date End Date Taking? Authorizing Provider  Calcium Carbonate-Vit D-Min (CALTRATE 600+D PLUS MINERALS) 600-800 MG-UNIT TABS Take 1 tablet by mouth daily.   Yes [provider]  Camphor-Eucalyptus-Menthol (VICKS VAPORUB EX) Apply topically 2 (two) times daily as needed (for congestion).    Yes [provider]  chlorthalidone (HYGROTON) 25 MG tablet Take 25 mg by mouth daily.  05/11/17  Yes [provider]  hydroxypropyl methylcellulose / hypromellose (ISOPTO TEARS / GONIOVISC) 2.5 % ophthalmic solution Place 1 drop into both eyes 3 (three) times daily.   Yes [provider]  nitrofurantoin, macrocrystal-monohydrate, (MACROBID) 100 MG capsule Take 100 mg by mouth 2 (two) times daily. 04/20/18  Yes [provider]  potassium chloride (K-DUR) 10 MEQ tablet Take 20 mEq by mouth daily.   Yes [provider]  propranolol ER (INDERAL LA) 80 MG 24 hr capsule TAKE 1 CAPSULE BY MOUTH  DAILY 01/30/17  Yes Nilda Riggs, NP  saccharomyces boulardii (FLORASTOR) 250 MG capsule Take 250 mg by mouth 2 (two) times daily.   Yes [provider]  cephALEXin (KEFLEX) 250 MG capsule Take 1 capsule (250 mg total) by mouth 4 (four) times daily. 04/20/18   Mancel Bale, MD  warfarin (COUMADIN) 1 MG tablet Take 1 mg by mouth daily.    [provider]  warfarin (COUMADIN) 2.5 MG tablet Take 2.5 mg by mouth daily.    [provider]    Family History Family History  Problem Relation Age of Onset  . Hypertension Father   . Heart attack Father   . Heart disease Unknown   . Hypertension Unknown   . Stroke Unknown   . Cancer Unknown  Social History Social History   Tobacco Use  . Smoking status: Never Smoker  . Smokeless tobacco: Never Used  Substance Use Topics  . Alcohol use: No  . Drug use: No     Allergies   Fosamax [alendronate] and Other   Review of Systems Review of Systems  Unable to perform ROS: Dementia     Physical Exam Updated Vital Signs BP (!) 158/115   Pulse (!) 121   Temp 99.3 F (37.4 C) (Rectal)   Resp (!) 22   Ht 5\' 4"  (1.626 m)   Wt 58 kg   SpO2 96%   BMI 21.95 kg/m   Physical Exam  Constitutional: She appears well-developed.  Elderly, frail  HENT:  Head: Normocephalic  and atraumatic.  Eyes: Pupils are equal, round, and reactive to light. Conjunctivae and EOM are normal.  Neck: Normal range of motion and phonation normal. Neck supple.  Cardiovascular: Normal rate and regular rhythm.  Pulmonary/Chest: Effort normal and breath sounds normal. She exhibits no tenderness.  Abdominal: Soft. She exhibits no distension. There is no tenderness. There is no guarding.  Musculoskeletal: Normal range of motion.  Neurological: She is alert. She exhibits normal muscle tone.  No dysarthria, or aphasia.  Alert and responsive.  She is confused.  Skin: Skin is warm and dry.  Psychiatric: She has a normal mood and affect. Her behavior is normal.  Nursing note and vitals reviewed.    ED Treatments / Results  Labs (all labs ordered are listed, but only abnormal results are displayed) Labs Reviewed  PROTIME-INR - Abnormal; Notable for the following components:      Result Value   Prothrombin Time 33.2 (*)    All other components within normal limits  APTT - Abnormal; Notable for the following components:   aPTT 44 (*)    All other components within normal limits  CBC - Abnormal; Notable for the following components:   WBC 12.8 (*)    RBC 5.12 (*)    Hemoglobin 15.5 (*)    HCT 46.7 (*)    All other components within normal limits  DIFFERENTIAL - Abnormal; Notable for the following components:   Neutro Abs 10.7 (*)    All other components within normal limits  COMPREHENSIVE METABOLIC PANEL - Abnormal; Notable for the following components:   Potassium 3.3 (*)    Chloride 97 (*)    CO2 35 (*)    Glucose, Bld 121 (*)    BUN 33 (*)    Creatinine, Ser 1.24 (*)    Calcium 10.6 (*)    GFR calc non Af Amer 35 (*)    GFR calc Af Amer 41 (*)    All other components within normal limits  CBG MONITORING, ED - Abnormal; Notable for the following components:   Glucose-Capillary 108 (*)    All other components within normal limits  I-STAT CG4 LACTIC ACID, ED - Abnormal;  Notable for the following components:   Lactic Acid, Venous 2.18 (*)    All other components within normal limits  URINE CULTURE  I-STAT TROPONIN, ED  I-STAT CG4 LACTIC ACID, ED    EKG EKG Interpretation  Date/Time:  Saturday April 20 2018 13:55:43 EDT Ventricular Rate:  90 PR Interval:    QRS Duration: 91 QT Interval:  364 QTC Calculation: 446 R Axis:   26 Text Interpretation:  Atrial fibrillation Repol abnrm suggests ischemia, diffuse leads since last tracing no significant change Persistant Atrial Fibrillation Confirmed by Mancel Bale [phone])  on 04/20/2018 2:00:23 PM   Radiology Ct Head Wo Contrast  Result Date: 04/20/2018 CLINICAL DATA:  Mental status changes.  Known UTI. EXAM: CT HEAD WITHOUT CONTRAST TECHNIQUE: Contiguous axial images were obtained from the base of the skull through the vertex without intravenous contrast. COMPARISON:  12/11/2017 FINDINGS: Brain: Stable age related cerebral atrophy, ventriculomegaly and periventricular white matter disease. No extra-axial fluid collections are identified. No CT findings for acute hemispheric infarction or intracranial hemorrhage. No mass lesions. The brainstem and cerebellum are normal. Vascular: No hyperdense vessel or unexpected calcification. Skull: No acute skull fracture or worrisome bone lesion. Stable prominent arachnoid granulations involving the occipital bone. Sinuses/Orbits: The paranasal sinuses and mastoid air cells are grossly clear. Other: No scalp hematoma obvious laceration. IMPRESSION: 1. Age related and stable appearing cerebral atrophy, ventriculomegaly and extensive periventricular white matter disease. 2. No acute intracranial findings or extra-axial fluid collections. 3. No bone lesions or fracture. Electronically Signed   By: Rudie MeyerP.  Gallerani M.D.   On: 04/20/2018 14:43    Procedures BLADDER CATHETERIZATION Date/Time: 04/20/2018 4:23 PM Performed by: Mancel BaleWentz, Cielo Arias, MD Authorized by: Mancel BaleWentz, Melida Northington, MD     Consent:    Consent obtained:  Emergent situation Pre-procedure details:    Procedure purpose:  Diagnostic   Preparation: Patient was prepped and draped in usual sterile fashion   Procedure details:    Provider performed due to:  Altered anatomy   Altered anatomy details: Atrophy.   Catheter size:  12 Fr   Bladder irrigation: no     Number of attempts:  1   Urine characteristics:  Clear Post-procedure details:    Patient tolerance of procedure:  Tolerated well, no immediate complications   (including critical care time)  Medications Ordered in ED Medications  sodium chloride 0.9 % bolus 500 mL (0 mLs Intravenous Stopped 04/20/18 1622)     Initial Impression / Assessment and Plan / ED Course  I have reviewed the triage vital signs and the nursing notes.  Pertinent labs & imaging results that were available during my care of the patient were reviewed by me and considered in my medical decision making (see chart for details).  Clinical Course as of Apr 20 1642  Sat Apr 20, 2018  1514 Mild elevation  I-Stat CG4 Lactic Acid, ED(!!) [EW]  1515 Mild elevation  APTT(!) [EW]  1515 Normal except white count high, hemoglobin high  CBC(!) [EW]  1515 Normal  I-stat troponin, ED [EW]  1515 Therapeutic elevation  Protime-INR(!) [EW]  1515 Mild elevation neutrophils  Differential(!) [EW]  1516 Normal except potassium low, chloride low, CO2 high, glucose high, BUN high, creatinine high, calcium  Comprehensive metabolic panel(!) [EW]  1516 No acute abnormality, images reviewed by me  CT HEAD WO CONTRAST [EW]    Clinical Course User Index [EW] Mancel BaleWentz, Marquail Bradwell, MD     Patient Vitals for the past 24 hrs:  BP Temp Temp src Pulse Resp SpO2 Height Weight  04/20/18 1603 (!) 158/115 - - (!) 121 (!) 22 96 % - -  04/20/18 1439 - 99.3 F (37.4 C) Rectal - - - - -  04/20/18 1430 (!) 141/77 - - (!) 110 18 98 % - -  04/20/18 1426 (!) 167/108 - - (!) 114 16 97 % - -  04/20/18 1335 - - - -  - - 5\' 4"  (1.626 m) 58 kg  04/20/18 1330 (!) 152/88 - - 81 19 98 % - -  04/20/18 1327 - - - - -  98 % - -    Medical Decision Making: Altered mental status with reassuring findings here.  Doubt CVA, acute sepsis, hemodynamic instability, metabolic instability or impending vascular collapse.  Patient currently being treated for UTI with oral antibiotic, Macrodantin.  Patient with mild renal insufficiency so will change to a drug better suited to this clinical picture.  Urine culture sent  CRITICAL CARE- No Performed by: Mancel Bale   Nursing Notes Reviewed/ Care Coordinated Applicable Imaging Reviewed Interpretation of Laboratory Data incorporated into ED treatment  The patient appears reasonably screened and/or stabilized for discharge and I doubt any other medical condition or other [facility] requiring further screening, evaluation, or treatment in the ED at this time prior to discharge.  Plan: Home Medications-continue usual medication except Macrodantin; Home Treatments-rest, oral fluids; return here if the recommended treatment, does not improve the symptoms; Recommended follow up-PCP, checkup 1 week and as needed.    Final Clinical Impressions(s) / ED Diagnoses   Final diagnoses:  Altered mental status, unspecified altered mental status type  Urinary tract infection without hematuria, site unspecified    ED Discharge Orders         Ordered    cephALEXin (KEFLEX) 250 MG capsule  4 times daily     04/20/18 1641           Mancel Bale, MD 04/20/18 1644

## 2018-04-20 NOTE — Discharge Instructions (Addendum)
The testing today did not show serious bacterial infection or strokes.  We are changing your antibiotic to 1 more appropriate for urinary tract infection, when the creatinine is slightly elevated.  We sent a urine culture to help evaluate for proper treatment of the UTI.

## 2018-04-20 NOTE — ED Notes (Signed)
IN&OUT CATH ATTEMPT FAILED DUE TO PROLAPSED UTERUS  UROLOGY WILL HAVE TO BE CALLED

## 2018-04-20 NOTE — ED Notes (Signed)
[facility] has been called  

## 2018-04-20 NOTE — ED Notes (Signed)
Pt has been changed and cleaned before transport.

## 2018-04-21 LAB — URINE CULTURE: Culture: NO GROWTH

## 2018-04-22 ENCOUNTER — Encounter: Payer: Self-pay | Admitting: Nurse Practitioner

## 2018-04-22 ENCOUNTER — Non-Acute Institutional Stay: Payer: Medicare Other | Admitting: Nurse Practitioner

## 2018-04-22 DIAGNOSIS — I10 Essential (primary) hypertension: Secondary | ICD-10-CM

## 2018-04-22 DIAGNOSIS — N183 Chronic kidney disease, stage 3 unspecified: Secondary | ICD-10-CM

## 2018-04-22 DIAGNOSIS — Z7901 Long term (current) use of anticoagulants: Secondary | ICD-10-CM

## 2018-04-22 DIAGNOSIS — E876 Hypokalemia: Secondary | ICD-10-CM | POA: Diagnosis not present

## 2018-04-22 DIAGNOSIS — G250 Essential tremor: Secondary | ICD-10-CM

## 2018-04-22 DIAGNOSIS — I482 Chronic atrial fibrillation, unspecified: Secondary | ICD-10-CM

## 2018-04-22 DIAGNOSIS — R58 Hemorrhage, not elsewhere classified: Secondary | ICD-10-CM | POA: Diagnosis not present

## 2018-04-22 DIAGNOSIS — D72829 Elevated white blood cell count, unspecified: Secondary | ICD-10-CM | POA: Diagnosis not present

## 2018-04-22 DIAGNOSIS — F0150 Vascular dementia without behavioral disturbance: Secondary | ICD-10-CM

## 2018-04-22 DIAGNOSIS — G309 Alzheimer's disease, unspecified: Secondary | ICD-10-CM

## 2018-04-22 DIAGNOSIS — N390 Urinary tract infection, site not specified: Secondary | ICD-10-CM

## 2018-04-22 DIAGNOSIS — F0280 Dementia in other diseases classified elsewhere without behavioral disturbance: Secondary | ICD-10-CM

## 2018-04-22 NOTE — Assessment & Plan Note (Signed)
The patient's mentation is at her baseline today. The patient had ED evaluation 04/20/18 for evaluation of altered mental status, had APTT, CBC(wbc 12.8), CMP(K 3.3-on KCL 20meq qd,  Bun 33, creat 1.24), lactic acid, troponin, PT/INR, CT head(no acute intracranial findings), EKG(vent rate 90) in ED. HPI was provided with assistance of staff due to her dementia. [facility], apparent she is no longer self sufficient in AL setting. May need SNF for safety and care assistance. 10/17/17 MMSE 20/30, update MMSE

## 2018-04-22 NOTE — Assessment & Plan Note (Signed)
HTN, blood pressure is controlled, continue Chlorthalidone 25mg  qd.

## 2018-04-22 NOTE — Assessment & Plan Note (Signed)
Right buttock, the patient has no recollection of the the event, will update CBC and continue to hold Coumadin in setting of elevated INR 4.1 04/20/18. Observe.

## 2018-04-22 NOTE — Assessment & Plan Note (Signed)
ED evaluation 04/20/18 K 3.3-on KCL 20meq qd, update CMP

## 2018-04-22 NOTE — Progress Notes (Signed)
Location:  [facility] Room Number: 806 Place of Service:  ALF [phone] Provider:  Messi Twedt, Arna SnipeManXie  NP  Oneal GroutPandey, Mahima, MD  Patient Care Team: Oneal GroutPandey, Mahima, MD as PCP - General (Internal Medicine) Nahser, Deloris PingPhilip J, MD as PCP - Cardiology (Cardiology)  Extended Emergency Contact Information Primary Emergency Contact: Morgan,Tom Address: [address]          [place], [place] [address] [place] Home Phone: [phone] Relation: Friend Secondary Emergency Contact: Brandy HaleFletcher,Christa  [place] Home Phone: [phone] Relation: Niece  Code Status:  DNR Goals of care: Advanced Directive information Advanced Directives 04/22/2018  Does Patient Have a Medical Advance Directive? Yes  Type of Estate agentAdvance Directive Healthcare Power of [place];Out of facility DNR (pink MOST or yellow form)  Does patient want to make changes to medical advance directive? No - Patient declined  Copy of Healthcare Power of Attorney in Chart? Yes  Would patient like information on creating a medical advance directive? -  Pre-existing out of facility DNR order (yellow form or pink MOST form) Yellow form placed in chart (order not valid for inpatient use)     Chief Complaint  Patient presents with  . Medical Management of Chronic Issues    F/u- HTN, chronic a-fib, CKD, alzheimers disease,    HPI:  Pt is a 84 y.o. female seen today for medical management of chronic diseases.    The patient is currently treated for UTI on 7 day course of Keflex 250mg  qid started 04/20/18 after Nitrofurantoin 100mg  bid 2x doses.  UA C/S 04/19/18 showed E Coli>100,000c/ml([facility]). The patient had ED evaluation 04/20/18 for evaluation of altered mental status, had APTT, CBC(wbc 12.8), CMP(K 3.3-on KCL 20meq qd,  Bun 33, creat 1.24), lactic acid, troponin, PT/INR, CT head(no acute intracranial findings), EKG(vent rate 90). She has history of Afib, heart rate is controlled today(HR 114bpm  EKG ED, Coumadin on hold due to INR 4.1 04/20/18, pending PT/INR 04/22/18. Hx of CKD creat 1.2 01/29/18, 1.24 04/20/18. HPI was provided with assistance of staff due to her dementia. [facility], apparent she is no longer self sufficient in AL setting. Her fingers has fine resting tremor, but not disabling, on Propranolol 80mg  qd. HTN, blood pressure is controlled on Chlorthalidone 25mg  qd.    Past Medical History:  Diagnosis Date  . Anticoagulant long-term use   . Atrial fibrillation (HCC)   . CHF (congestive heart failure) (HCC)   . Chronic anticoagulation   . Hypertension   . Raynaud phenomenon   . Tremor, essential    Past Surgical History:  Procedure Laterality Date  . CHOLECYSTECTOMY    . TONSILLECTOMY      Allergies  Allergen Reactions  . Fosamax [Alendronate]   . Other Nausea And Vomiting    Green pepper    Outpatient Encounter Medications as of 04/22/2018  Medication Sig  . Calcium Carbonate-Vit D-Min (CALTRATE 600+D PLUS MINERALS) 600-800 MG-UNIT TABS Take 1 tablet by mouth daily.  . cephALEXin (KEFLEX) 250 MG capsule Take 1 capsule (250 mg total) by mouth 4 (four) times daily.  . chlorthalidone (HYGROTON) 25 MG tablet Take 25 mg by mouth daily.   . feeding supplement (BOOST / RESOURCE BREEZE) LIQD Take 1 Container by mouth 2 (two) times daily between meals.  . hydroxypropyl methylcellulose / hypromellose (ISOPTO TEARS / GONIOVISC) 2.5 % ophthalmic solution Place 1 drop into both eyes 3 (three) times daily.  . potassium chloride (K-DUR)  10 MEQ tablet Take 20 mEq by mouth daily.  . propranolol ER (INDERAL LA) 80 MG 24 hr capsule TAKE 1 CAPSULE BY MOUTH  DAILY  . saccharomyces boulardii (FLORASTOR) 250 MG capsule Take 250 mg by mouth 2 (two) times daily.  . [DISCONTINUED] Camphor-Eucalyptus-Menthol (VICKS VAPORUB EX) Apply topically 2 (two) times daily as needed (for congestion).  . [DISCONTINUED] nitrofurantoin, macrocrystal-monohydrate, (MACROBID) 100 MG  capsule Take 100 mg by mouth 2 (two) times daily.  . [DISCONTINUED] warfarin (COUMADIN) 1 MG tablet Take 1 mg by mouth daily.  . [DISCONTINUED] warfarin (COUMADIN) 2.5 MG tablet Take 2.5 mg by mouth daily.   No facility-administered encounter medications on file as of 04/22/2018.    ROS was provided with assistance of staff Review of Systems  Constitutional: Positive for activity change and fatigue. Negative for appetite change, chills, diaphoresis, fever and unexpected weight change.  HENT: Positive for hearing loss. Negative for congestion and voice change.   Respiratory: Negative for cough, shortness of breath and wheezing.   Cardiovascular: Positive for leg swelling. Negative for chest pain and palpitations.  Gastrointestinal: Negative for abdominal distention, abdominal pain, blood in stool, constipation, diarrhea, nausea and vomiting.  Genitourinary: Positive for dysuria, frequency and urgency. Negative for difficulty urinating, flank pain and hematuria.  Musculoskeletal: Positive for arthralgias and gait problem. Negative for joint swelling.  Skin: Positive for color change. Negative for pallor.       Right buttock bruise.   Neurological: Positive for tremors. Negative for dizziness, speech difficulty and headaches.       Dementia.   Psychiatric/Behavioral: Positive for confusion. Negative for agitation, behavioral problems, hallucinations and sleep disturbance. The patient is not nervous/anxious.     Immunization History  Administered Date(s) Administered  . Influenza-Unspecified 05/28/2017  . Pneumococcal Conjugate-13 07/13/2017  . Tdap 07/13/2017   Pertinent  Health Maintenance Due  Topic Date Due  . INFLUENZA VACCINE  03/07/2018  . PNA vac Low Risk Adult (2 of 2 - PPSV23) 07/13/2018  . DEXA SCAN  Completed   Fall Risk  10/05/2017  Falls in the past year? No   Functional Status Survey:    Vitals:   04/22/18 1048  BP: 110/60  Pulse: 84  Resp: 18  Temp: 98.7 F (37.1  C)  SpO2: 96%  Weight: 129 lb (58.5 kg)  Height: 5\' 4"  (1.626 m)   Body mass index is 22.14 kg/m. Physical Exam  Constitutional: She appears well-developed and well-nourished.  HENT:  Head: Normocephalic and atraumatic.  Eyes: Pupils are equal, round, and reactive to light. EOM are normal.  Neck: Normal range of motion. Neck supple. No JVD present. No thyromegaly present.  Cardiovascular: Normal rate.  Murmur heard. Irregular heart beats.   Pulmonary/Chest: Effort normal and breath sounds normal. She has no wheezes. She has no rales.  Abdominal: Soft. Bowel sounds are normal. She exhibits no distension. There is no tenderness. There is no rebound and no guarding.  Musculoskeletal: She exhibits edema.  Trace edema BLE. Ambulates with walker. Self transfer.   Neurological: She is alert. No cranial nerve deficit. She exhibits normal muscle tone. Coordination normal.  Oriented to self and her room on unit.   Skin: Skin is warm and dry. No erythema. No pallor.  Right buttock bruise.   Psychiatric: She has a normal mood and affect. Her behavior is normal.    Labs reviewed: Recent Labs    12/11/17 0807  01/29/18 04/17/18 04/20/18 1337  NA 139   < > 141  143 144  K 3.2*   < > 3.7 4.0 3.3*  CL 95*   < > 100 100 97*  CO2 35*   < > 33 33 35*  GLUCOSE 119*  --   --   --  121*  BUN 30*   < > 33* 39* 33*  CREATININE 1.23*   < > 1.2* 1.4* 1.24*  CALCIUM 10.0   < > 9.7 10.1 10.6*  MG  --   --  1.7  --   --    < > = values in this interval not displayed.   Recent Labs    01/09/18 04/17/18 04/20/18 1337  AST 16 17 24   ALT 8 9 16   ALKPHOS 42 37 47  BILITOT  --   --  0.7  PROT  --  5.6 7.9  ALBUMIN 3.5 3.3 4.1   Recent Labs    12/05/17 12/11/17 0625 01/09/18 04/17/18 04/20/18 1337  WBC 8.8 10.1 7.2 9.1 12.8*  NEUTROABS 4,928 7.4  --   --  10.7*  HGB 12.4 14.2 12.7 13.2 15.5*  HCT 36 42.0 37 40 46.7*  MCV  --  90.3  --   --  91.2  PLT 188 247 194 254 269   Lab Results    Component Value Date   TSH 0.90 07/12/2017   No results found for: HGBA1C No results found for: CHOL, HDL, LDLCALC, LDLDIRECT, TRIG, CHOLHDL  Significant Diagnostic Results in last 30 days:  Ct Head Wo Contrast  Result Date: 04/20/2018 CLINICAL DATA:  Mental status changes.  Known UTI. EXAM: CT HEAD WITHOUT CONTRAST TECHNIQUE: Contiguous axial images were obtained from the base of the skull through the vertex without intravenous contrast. COMPARISON:  12/11/2017 FINDINGS: Brain: Stable age related cerebral atrophy, ventriculomegaly and periventricular white matter disease. No extra-axial fluid collections are identified. No CT findings for acute hemispheric infarction or intracranial hemorrhage. No mass lesions. The brainstem and cerebellum are normal. Vascular: No hyperdense vessel or unexpected calcification. Skull: No acute skull fracture or worrisome bone lesion. Stable prominent arachnoid granulations involving the occipital bone. Sinuses/Orbits: The paranasal sinuses and mastoid air cells are grossly clear. Other: No scalp hematoma obvious laceration. IMPRESSION: 1. Age related and stable appearing cerebral atrophy, ventriculomegaly and extensive periventricular white matter disease. 2. No acute intracranial findings or extra-axial fluid collections. 3. No bone lesions or fracture. Electronically Signed   By: Rudie Meyer M.D.   On: 04/20/2018 14:43    Assessment/Plan Leukocytosis ED evaluation 04/20/18 CBC(wbc 12.8), in setting of UTI, will update CBC/diff   Hypokalemia ED evaluation 04/20/18 K 3.3-on KCL qd, update CMP   Chronic anticoagulation Coumadin on hold due to INR 4.1 04/20/18, pending PT/INR 04/22/18.    Chronic a-fib (HCC) EKG(vent rate 90). She has history of Afib, heart rate is controlled today(HR 114bpm EKG ED, Coumadin on hold due to INR 4.1 04/20/18, pending PT/INR today.    UTI (urinary tract infection) The patient is currently treated for UTI, continue 7 day  course of Keflex 250mg  qid started 04/20/18,   UA C/S 04/19/18 showed E Coli>100,000c/ml([facility]). Observe.    Mixed Alzheimer's and vascular dementia The patient's mentation is at her baseline today. The patient had ED evaluation 04/20/18 for evaluation of altered mental status, had APTT, CBC(wbc 12.8), CMP(K 3.3-on KCL qd,  Bun 33, creat 1.24), lactic acid, troponin, PT/INR, CT head(no acute intracranial findings), EKG(vent rate 90) in ED. HPI was provided with assistance of staff due to her  dementia. The patient resides in AL [facility], apparent she is no longer self sufficient in AL setting. May need SNF for safety and care assistance. 10/17/17 MMSE 20/30, update MMSE   Hypertension HTN, blood pressure is controlled, continue Chlorthalidone 25mg  qd.    CKD (chronic kidney disease) stage 3, GFR 30-59 ml/min (HCC) Hx of CKD creat 1.2 01/29/18, 1.24 04/20/18.    Tremor, essential Her fingers has fine resting tremor, but not disabling, on Propranolol 80mg  qd.    Ecchymosis Right buttock, the patient has no recollection of the the event, will update CBC and continue to hold Coumadin in setting of elevated INR 4.1 04/20/18. Observe.     Family/ staff Communication: plan of care reviewed with the patient and charge nurse.   Labs/tests ordered:  PT/INR pending 04/22/18, CBC/diff, CMP, MMSE  Time spend 25 minutes.

## 2018-04-22 NOTE — Assessment & Plan Note (Signed)
EKG(vent rate 90). She has history of Afib, heart rate is controlled today(HR 114bpm EKG ED, Coumadin on hold due to INR 4.1 04/20/18, pending PT/INR today.

## 2018-04-22 NOTE — Assessment & Plan Note (Signed)
Coumadin on hold due to INR 4.1 04/20/18, pending PT/INR 04/22/18.

## 2018-04-22 NOTE — Assessment & Plan Note (Signed)
ED evaluation 04/20/18 CBC(wbc 12.8), in setting of UTI, will update CBC/diff

## 2018-04-22 NOTE — Assessment & Plan Note (Signed)
The patient is currently treated for UTI, continue 7 day course of Keflex 250mg  qid started 04/20/18,   UA C/S 04/19/18 showed E Coli>100,000c/ml([facility]). Observe.

## 2018-04-22 NOTE — Assessment & Plan Note (Signed)
Her fingers has fine resting tremor, but not disabling, on Propranolol 80mg  qd.

## 2018-04-22 NOTE — Assessment & Plan Note (Signed)
Hx of CKD creat 1.2 01/29/18, 1.24 04/20/18.

## 2018-04-24 ENCOUNTER — Non-Acute Institutional Stay: Payer: Medicare Other | Admitting: Nurse Practitioner

## 2018-04-24 ENCOUNTER — Encounter: Payer: Self-pay | Admitting: Nurse Practitioner

## 2018-04-24 ENCOUNTER — Encounter: Payer: Self-pay | Admitting: *Deleted

## 2018-04-24 DIAGNOSIS — W19XXXA Unspecified fall, initial encounter: Secondary | ICD-10-CM | POA: Diagnosis not present

## 2018-04-24 DIAGNOSIS — F0150 Vascular dementia without behavioral disturbance: Secondary | ICD-10-CM

## 2018-04-24 DIAGNOSIS — E876 Hypokalemia: Secondary | ICD-10-CM

## 2018-04-24 DIAGNOSIS — R58 Hemorrhage, not elsewhere classified: Secondary | ICD-10-CM

## 2018-04-24 DIAGNOSIS — S3992XA Unspecified injury of lower back, initial encounter: Secondary | ICD-10-CM | POA: Diagnosis not present

## 2018-04-24 DIAGNOSIS — G309 Alzheimer's disease, unspecified: Secondary | ICD-10-CM

## 2018-04-24 DIAGNOSIS — M546 Pain in thoracic spine: Secondary | ICD-10-CM | POA: Diagnosis not present

## 2018-04-24 DIAGNOSIS — F0280 Dementia in other diseases classified elsewhere without behavioral disturbance: Secondary | ICD-10-CM

## 2018-04-24 DIAGNOSIS — Z7901 Long term (current) use of anticoagulants: Secondary | ICD-10-CM

## 2018-04-24 DIAGNOSIS — M549 Dorsalgia, unspecified: Secondary | ICD-10-CM | POA: Insufficient documentation

## 2018-04-24 DIAGNOSIS — R079 Chest pain, unspecified: Secondary | ICD-10-CM | POA: Diagnosis not present

## 2018-04-24 NOTE — Assessment & Plan Note (Addendum)
Coumadin on hold, pending PT/INR

## 2018-04-24 NOTE — Assessment & Plan Note (Signed)
sustained mid and right middle back pain from fall 04/23/18 when she slid out of chair in her room. Thoracic spinous process and posterior right rib cage pain palpated and reproduced when patient stood up from her recliner. Will obtain X-ray thoracic spine, right ribs to r/o fxs. Prn Tylenol available to her.

## 2018-04-24 NOTE — Progress Notes (Signed)
Location:  [facility] Room Number: 806 Place of Service:  ALF [phone]) Provider:  Viona Hosking, Arna Snipe  NP  Oneal Grout, MD  Patient Care Team: Oneal Grout, MD as PCP - General (Internal Medicine) Nahser, Deloris Ping, MD as PCP - Cardiology (Cardiology)  Extended Emergency Contact Information Primary Emergency Contact: Morgan,Tom Address: [address]          [place], [place] [address] [place] Home Phone: [phone] Relation: Friend Secondary Emergency Contact: Brandy Hale [place] Home Phone: [phone] Relation: Niece  Code Status:  DNR Goals of care: Advanced Directive information Advanced Directives 04/22/2018  Does Patient Have a Medical Advance Directive? Yes  Type of Estate agent of [place];Out of facility DNR (pink MOST or yellow form)  Does patient want to make changes to medical advance directive? No - Patient declined  Copy of Healthcare Power of Attorney in Chart? Yes  Would patient like information on creating a medical advance directive? -  Pre-existing out of facility DNR order (yellow form or pink MOST form) Yellow form placed in chart (order not valid for inpatient use)     Chief Complaint  Patient presents with  . Acute Visit    Larey Seat (04/24/2018), middle back pain    HPI:  Pt is a 84 y.o. female seen today for an acute visit for sustained mid and right middle back pain from fall 04/23/18 when she slid out of chair in her room. Thoracic spinous process and posterior right rib cage pain palpated and reproduced when patient stood up from her recliner. HPI was provided with assistance of staff due to her dementia, MMSE. Pending CBC/diff, CMP in setting of right buttock contusion and serum K3.3 04/20/18. Currently she has been treated with 7 day course of Keflex 500mg  tid, tolerated.    Past Medical History:  Diagnosis Date  . Anticoagulant long-term use   . Atrial  fibrillation (HCC)   . CHF (congestive heart failure) (HCC)   . Chronic anticoagulation   . Hypertension   . Raynaud phenomenon   . Tremor, essential    Past Surgical History:  Procedure Laterality Date  . CHOLECYSTECTOMY    . TONSILLECTOMY      Allergies  Allergen Reactions  . Fosamax [Alendronate]   . Other Nausea And Vomiting    Green pepper    Outpatient Encounter Medications as of 04/24/2018  Medication Sig  . acetaminophen (TYLENOL) 325 MG tablet Take 650 mg by mouth every 4 (four) hours as needed.  . Calcium Carbonate-Vit D-Min (CALTRATE 600+D PLUS MINERALS) 600-800 MG-UNIT TABS Take 1 tablet by mouth 2 (two) times daily.   . cephALEXin (KEFLEX) 250 MG capsule Take 1 capsule (250 mg total) by mouth 4 (four) times daily.  . chlorthalidone (HYGROTON) 25 MG tablet Take 25 mg by mouth daily.   . feeding supplement (BOOST / RESOURCE BREEZE) LIQD Take 1 Container by mouth 2 (two) times daily between meals.  . hydroxypropyl methylcellulose / hypromellose (ISOPTO TEARS / GONIOVISC) 2.5 % ophthalmic solution Place 1 drop into both eyes 3 (three) times daily.  . potassium chloride (K-DUR) 10 MEQ tablet Take 20 mEq by mouth daily.  . propranolol ER (INDERAL LA) 80 MG 24 hr capsule TAKE 1 CAPSULE BY MOUTH  DAILY  . saccharomyces boulardii (FLORASTOR) 250 MG capsule Take 250 mg by mouth 2 (two) times daily.   No facility-administered encounter medications on file as of 04/24/2018.    ROS  was provided with assistance of staff.  Review of Systems  Constitutional: Positive for activity change and fatigue. Negative for appetite change, chills, diaphoresis and fever.  HENT: Positive for hearing loss. Negative for congestion and voice change.   Respiratory: Negative for cough, shortness of breath and wheezing.   Cardiovascular: Positive for leg swelling. Negative for chest pain and palpitations.  Gastrointestinal: Negative for abdominal distention, abdominal pain, constipation, diarrhea,  nausea and vomiting.  Genitourinary: Negative for difficulty urinating, dysuria and urgency.  Musculoskeletal: Positive for arthralgias, back pain and gait problem. Negative for joint swelling.  Skin: Positive for color change and wound.  Neurological: Negative for dizziness, speech difficulty, weakness and headaches.       Dementia  Psychiatric/Behavioral: Positive for confusion. Negative for agitation, behavioral problems, hallucinations and sleep disturbance. The patient is not nervous/anxious.     Immunization History  Administered Date(s) Administered  . Influenza-Unspecified 05/28/2017  . Pneumococcal Conjugate-13 07/13/2017  . Tdap 07/13/2017   Pertinent  Health Maintenance Due  Topic Date Due  . INFLUENZA VACCINE  03/07/2018  . PNA vac Low Risk Adult (2 of 2 - PPSV23) 07/13/2018  . DEXA SCAN  Completed   Fall Risk  10/05/2017  Falls in the past year? No   Functional Status Survey:    Vitals:   04/24/18 1317  BP: 130/70  Pulse: 84  Resp: 20  Temp: 98.3 F (36.8 C)  SpO2: 95%  Weight: 129 lb (58.5 kg)  Height: 5\' 4"  (1.626 m)   Body mass index is 22.14 kg/m. Physical Exam  Constitutional: She appears well-developed and well-nourished.  HENT:  Head: Normocephalic and atraumatic.  Eyes: Pupils are equal, round, and reactive to light. EOM are normal.  Neck: Normal range of motion. Neck supple. No JVD present. No thyromegaly present.  Cardiovascular: Normal rate.  Murmur heard. Irregular heart beats   Pulmonary/Chest: Effort normal. She has no wheezes. She has no rales.  Abdominal: Soft. Bowel sounds are normal. She exhibits no distension. There is no tenderness.  Musculoskeletal: She exhibits edema and tenderness.  Trace edema BLE, self transfer. Ambulates with walker. Mid back thoracic spinous process tenderness and mid right rib cage pain palpated and reproduced when the patient stood up. Unable to rate her pain level or nature of pain.   Neurological: She is  alert. No cranial nerve deficit. She exhibits normal muscle tone. Coordination normal.  Oriented to self and her room on unit.   Skin: Skin is warm and dry.  Posterior mid rib cage abrasion. Right buttock bruise.   Psychiatric: She has a normal mood and affect.    Labs reviewed: Recent Labs    12/11/17 0807  01/29/18 04/17/18 04/20/18 1337  NA 139   < > 141 143 144  K 3.2*   < > 3.7 4.0 3.3*  CL 95*   < > 100 100 97*  CO2 35*   < > 33 33 35*  GLUCOSE 119*  --   --   --  121*  BUN 30*   < > 33* 39* 33*  CREATININE 1.23*   < > 1.2* 1.4* 1.24*  CALCIUM 10.0   < > 9.7 10.1 10.6*  MG  --   --  1.7  --   --    < > = values in this interval not displayed.   Recent Labs    01/09/18 04/17/18 04/20/18 1337  AST 16 17 24   ALT 8 9 16   ALKPHOS 42 37 47  BILITOT  --   --  0.7  PROT  --  5.6 7.9  ALBUMIN 3.5 3.3 4.1   Recent Labs    12/05/17 12/11/17 0625 01/09/18 04/17/18 04/20/18 1337  WBC 8.8 10.1 7.2 9.1 12.8*  NEUTROABS 4,928 7.4  --   --  10.7*  HGB 12.4 14.2 12.7 13.2 15.5*  HCT 36 42.0 37 40 46.7*  MCV  --  90.3  --   --  91.2  PLT 188 247 194 254 269   Lab Results  Component Value Date   TSH 0.90 07/12/2017   No results found for: HGBA1C No results found for: CHOL, HDL, LDLCALC, LDLDIRECT, TRIG, CHOLHDL  Significant Diagnostic Results in last 30 days:  Ct Head Wo Contrast  Result Date: 04/20/2018 CLINICAL DATA:  Mental status changes.  Known UTI. EXAM: CT HEAD WITHOUT CONTRAST TECHNIQUE: Contiguous axial images were obtained from the base of the skull through the vertex without intravenous contrast. COMPARISON:  12/11/2017 FINDINGS: Brain: Stable age related cerebral atrophy, ventriculomegaly and periventricular white matter disease. No extra-axial fluid collections are identified. No CT findings for acute hemispheric infarction or intracranial hemorrhage. No mass lesions. The brainstem and cerebellum are normal. Vascular: No hyperdense vessel or unexpected  calcification. Skull: No acute skull fracture or worrisome bone lesion. Stable prominent arachnoid granulations involving the occipital bone. Sinuses/Orbits: The paranasal sinuses and mastoid air cells are grossly clear. Other: No scalp hematoma obvious laceration. IMPRESSION: 1. Age related and stable appearing cerebral atrophy, ventriculomegaly and extensive periventricular white matter disease. 2. No acute intracranial findings or extra-axial fluid collections. 3. No bone lesions or fracture. Electronically Signed   By: Rudie MeyerP.  Gallerani M.D.   On: 04/20/2018 14:43    Assessment/Plan Back pain due to injury sustained mid and right middle back pain from fall 04/23/18 when she slid out of chair in her room. Thoracic spinous process and posterior right rib cage pain palpated and reproduced when patient stood up from her recliner. Will obtain X-ray thoracic spine, right ribs to r/o fxs. Prn Tylenol available to her.   Fall Lack of safety awareness and increased frailty are contributory, needs SNF [facility] for safety and care assistance, close supervision for fall risk.   Mixed Alzheimer's and vascular dementia HPI was provided with assistance of staff due to her dementia, MMSE 14/30 04/23/18, continue close supervision for safety, assistance for ADLs.   Ecchymosis  Pending CBC/diff, CMP in setting of right buttock contusion and serum K3.3 04/20/18.   Hypokalemia Last serum K 3.3 04/20/18, pending CMP, continue Kcl  Chronic anticoagulation Coumadin on hold, pending PT/INR    Family/ staff Communication: plan of care reviewed with the patient and charge nurse.   Labs/tests ordered: X-ray thoracic spine, right ribs. Ortho referral   Time spend 25 minutes.

## 2018-04-24 NOTE — Assessment & Plan Note (Signed)
Lack of safety awareness and increased frailty are contributory, needs SNF [facility] for safety and care assistance, close supervision for fall risk.

## 2018-04-24 NOTE — Assessment & Plan Note (Signed)
Pending CBC/diff, CMP in setting of right buttock contusion and serum K3.3 04/20/18.

## 2018-04-24 NOTE — Assessment & Plan Note (Signed)
Last serum K 3.3 04/20/18, pending CMP, continue Kcl

## 2018-04-24 NOTE — Assessment & Plan Note (Signed)
HPI was provided with assistance of staff due to her dementia, MMSE 14/30 04/23/18, continue close supervision for safety, assistance for ADLs.

## 2018-04-25 ENCOUNTER — Encounter: Payer: Self-pay | Admitting: Nurse Practitioner

## 2018-04-25 DIAGNOSIS — E876 Hypokalemia: Secondary | ICD-10-CM | POA: Diagnosis not present

## 2018-04-25 DIAGNOSIS — D72829 Elevated white blood cell count, unspecified: Secondary | ICD-10-CM | POA: Diagnosis not present

## 2018-04-25 DIAGNOSIS — I4891 Unspecified atrial fibrillation: Secondary | ICD-10-CM | POA: Diagnosis not present

## 2018-04-25 LAB — BASIC METABOLIC PANEL WITH GFR
BUN: 29 — AB (ref 4–21)
Creatinine: 1.2 — AB (ref <=1.1)
Glucose: 84
Potassium: 3.4 (ref 3.4–5.3)
Sodium: 135 — AB (ref 137–147)

## 2018-04-25 LAB — CBC AND DIFFERENTIAL
HCT: 34 — AB (ref 36–46)
Hemoglobin: 11.9 — AB (ref 12.0–16.0)
Platelets: 221 (ref 150–399)
WBC: 11.5

## 2018-04-25 LAB — HEPATIC FUNCTION PANEL
ALT: 8 (ref 7–35)
AST: 14 (ref 13–35)
Alkaline Phosphatase: 40 (ref 25–125)
Bilirubin, Total: 0.8

## 2018-04-26 ENCOUNTER — Non-Acute Institutional Stay (SKILLED_NURSING_FACILITY): Payer: Medicare Other | Admitting: Internal Medicine

## 2018-04-26 ENCOUNTER — Other Ambulatory Visit: Payer: Self-pay | Admitting: *Deleted

## 2018-04-26 DIAGNOSIS — R2681 Unsteadiness on feet: Secondary | ICD-10-CM

## 2018-04-26 DIAGNOSIS — S32019D Unspecified fracture of first lumbar vertebra, subsequent encounter for fracture with routine healing: Secondary | ICD-10-CM | POA: Diagnosis not present

## 2018-04-26 DIAGNOSIS — S22080A Wedge compression fracture of T11-T12 vertebra, initial encounter for closed fracture: Secondary | ICD-10-CM | POA: Diagnosis not present

## 2018-04-26 DIAGNOSIS — I4891 Unspecified atrial fibrillation: Secondary | ICD-10-CM | POA: Diagnosis not present

## 2018-04-26 DIAGNOSIS — D72829 Elevated white blood cell count, unspecified: Secondary | ICD-10-CM | POA: Diagnosis not present

## 2018-04-26 DIAGNOSIS — E876 Hypokalemia: Secondary | ICD-10-CM | POA: Diagnosis not present

## 2018-04-26 DIAGNOSIS — M549 Dorsalgia, unspecified: Secondary | ICD-10-CM

## 2018-04-26 DIAGNOSIS — R1312 Dysphagia, oropharyngeal phase: Secondary | ICD-10-CM | POA: Diagnosis not present

## 2018-04-26 DIAGNOSIS — F0280 Dementia in other diseases classified elsewhere without behavioral disturbance: Secondary | ICD-10-CM

## 2018-04-26 DIAGNOSIS — N3000 Acute cystitis without hematuria: Secondary | ICD-10-CM | POA: Diagnosis not present

## 2018-04-26 DIAGNOSIS — R278 Other lack of coordination: Secondary | ICD-10-CM | POA: Diagnosis not present

## 2018-04-26 DIAGNOSIS — G250 Essential tremor: Secondary | ICD-10-CM | POA: Diagnosis not present

## 2018-04-26 DIAGNOSIS — M545 Low back pain, unspecified: Secondary | ICD-10-CM | POA: Insufficient documentation

## 2018-04-26 DIAGNOSIS — I10 Essential (primary) hypertension: Secondary | ICD-10-CM

## 2018-04-26 DIAGNOSIS — R5381 Other malaise: Secondary | ICD-10-CM | POA: Diagnosis not present

## 2018-04-26 DIAGNOSIS — E871 Hypo-osmolality and hyponatremia: Secondary | ICD-10-CM | POA: Diagnosis not present

## 2018-04-26 DIAGNOSIS — R41841 Cognitive communication deficit: Secondary | ICD-10-CM | POA: Diagnosis not present

## 2018-04-26 DIAGNOSIS — N183 Chronic kidney disease, stage 3 unspecified: Secondary | ICD-10-CM

## 2018-04-26 DIAGNOSIS — S3992XA Unspecified injury of lower back, initial encounter: Secondary | ICD-10-CM

## 2018-04-26 DIAGNOSIS — G309 Alzheimer's disease, unspecified: Secondary | ICD-10-CM

## 2018-04-26 DIAGNOSIS — F0150 Vascular dementia without behavioral disturbance: Secondary | ICD-10-CM

## 2018-04-26 DIAGNOSIS — M6281 Muscle weakness (generalized): Secondary | ICD-10-CM | POA: Diagnosis not present

## 2018-04-26 LAB — PROTIME-INR: PROTIME: 39.2 — AB (ref 10.0–13.8)

## 2018-04-26 LAB — COMPLETE METABOLIC PANEL WITHOUT GFR
Albumin: 3.2
Calcium: 8.9
Carbon Dioxide, Total: 35
Chloride: 93
EGFR (Non-African Amer.): 40
Globulin: 2.3
Total Protein: 5.5 g/dL

## 2018-04-26 LAB — POCT INR: INR: 3.7 — AB (ref <=1.1)

## 2018-04-26 NOTE — Progress Notes (Signed)
Provider:  Blanchie Serve MD  Location:  [facility] Room Number: 40 Place of Service:  SNF (31)  PCP: Blanchie Serve, MD Patient Care Team: Blanchie Serve, MD as PCP - General (Internal Medicine) Nahser, Wonda Cheng, MD as PCP - Cardiology (Cardiology)  Extended Emergency Contact Information Primary Emergency Contact: Morgan,Tom Address: [address]          [place], [place] [address] Johnnette Litter of [place] Phone: [phone] Relation: Friend Secondary Emergency Contact: [place] Phone: [phone] Relation: Niece  Code Status: DNR  Goals of Care: Advanced Directive information Advanced Directives 04/22/2018  Does Patient Have a Medical Advance Directive? Yes  Type of Paramedic of [place];Out of facility DNR (pink MOST or yellow form)  Does patient want to make changes to medical advance directive? No - Patient declined  Copy of [place] in Chart? Yes  Would patient like information on creating a medical advance directive? -  Pre-existing out of facility DNR order (yellow form or pink MOST form) Yellow form placed in chart (order not valid for inpatient use)    Chief Complaint  Patient presents with  . New Admit To SNF    HPI: Patient is a 84 y.o. female seen today for admission visit. She has been admitted to SNF from ALF due to deconditioning and increased need for assistance with her ADLs. She had a fall on 04/24/18 with mid back pain and underwent imaging of her back. Xray of thoracic spine showed anterior wedge fracture of T12 and compression fracture and vertebroplasty of L1. She is pending orthopedic referral.  She was in the ED on 04/20/18 with altered mental state and during this visit she had been on antibiotic for UTI. She underwent blood work, CT head and urine catheterization and a urine sample was sent. Reviewed urine culture showing no bacterial  growth but this was sent 2 days after being on antibiotic. Rest of her lab work was unrevealing. She is currently on antibiotic for UTI. She has medical history of dementia, chronic afib, neuropathy, ckd stage 3 among others. Per nursing, she refused to take few medications this am including her kcl.   Past Medical History:  Diagnosis Date  . Anticoagulant long-term use   . Atrial fibrillation ([place])   . CHF (congestive heart failure) ([place])   . Chronic anticoagulation   . Hypertension   . Raynaud phenomenon   . Tremor, essential    Past Surgical History:  Procedure Laterality Date  . CHOLECYSTECTOMY    . TONSILLECTOMY      reports that she has never smoked. She has never used smokeless tobacco. She reports that she does not drink alcohol or use drugs. Social History   Socioeconomic History  . Marital status: Widowed    Spouse name: Not on file  . Number of children: 0  . Years of education: 66  . Highest education level: Not on file  Occupational History  . Occupation: Retired- Engineer, manufacturing education  Social Needs  . Financial resource strain: Not hard at all  . Food insecurity:    Worry: Never true    Inability: Never true  . Transportation needs:    Medical: No    Non-medical: No  Tobacco Use  . Smoking status: Never Smoker  . Smokeless tobacco: Never Used  Substance and Sexual Activity  . Alcohol use: No  . Drug use: No  . Sexual activity:  Not Currently  Lifestyle  . Physical activity:    Days per week: 0 days    Minutes per session: 0 min  . Stress: Not at all  Relationships  . Social connections:    Talks on phone: More than three times a week    Gets together: More than three times a week    Attends religious service: Never    Active member of club or organization: No    Attends meetings of clubs or organizations: Never    Relationship status: Widowed  . Intimate partner violence:    Fear of current or ex partner: No    Emotionally abused: No    Physically  abused: No    Forced sexual activity: No  Other Topics Concern  . Not on file  Social History Narrative   Tobacco use, amount per day now: No      Past tobacco use, amount per day:      How many years did you use tobacco:      Alcohol use (drinks per week): occasional wine      Diet:      Do you drink/eat things with caffeine? Decaf coffee.      Marital status: Widowed            What year were you married?      Do you live in a house, apartment, assisted living, condo, trailer? Assisted living      Is it one or more stories? 1      How many persons live in your home? 1      Do you have any pets in your home? 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       Do you exercise? No            How often?      Do you have a living will?       Do you have a DNR form? No           If not, do you want to discuss one? Yes      Do you have signed POA/HPOA forms? Yes                 Functional Status Survey:    Family History  Problem Relation Age of Onset  . Hypertension Father   . Heart attack Father   . Heart disease Unknown   . Hypertension Unknown   . Stroke Unknown   . Cancer Unknown     Health Maintenance  Topic Date Due  . INFLUENZA VACCINE  03/07/2018  . PNA vac Low Risk Adult (2 of 2 - PPSV23) 07/13/2018  . TETANUS/TDAP  07/14/2027  . DEXA SCAN  Completed    Allergies  Allergen Reactions  . Fosamax [Alendronate]   . Other Nausea And Vomiting    Green pepper    Outpatient Encounter Medications as of 04/26/2018  Medication Sig  . acetaminophen (TYLENOL) 325 MG tablet Take 650 mg by mouth every 4 (four) hours as needed.  . Calcium Carbonate-Vit D-Min (CALTRATE 600+D PLUS MINERALS) 600-800 MG-UNIT TABS Take 1 tablet by mouth 2 (two) times daily.   . cephALEXin (KEFLEX) 250 MG capsule Take 1 capsule (250 mg total) by mouth 4 (four) times daily.  . chlorthalidone (HYGROTON) 25 MG tablet Take 25 mg by mouth daily.   . feeding supplement  (BOOST / RESOURCE BREEZE) LIQD Take 1 Container by mouth 2 (two) times  daily between meals.  . hydroxypropyl methylcellulose / hypromellose (ISOPTO TEARS / GONIOVISC) 2.5 % ophthalmic solution Place 1 drop into both eyes 3 (three) times daily.  . potassium chloride (K-DUR) 10 MEQ tablet Take 20 mEq by mouth daily.  . propranolol ER (INDERAL LA) 80 MG 24 hr capsule TAKE 1 CAPSULE BY MOUTH  DAILY  . saccharomyces boulardii (FLORASTOR) 250 MG capsule Take 250 mg by mouth 2 (two) times daily.   No facility-administered encounter medications on file as of 04/26/2018.     Review of Systems  Reason unable to perform ROS: limited with her dementia and hearing loss.  Constitutional: Negative for appetite change and fever.  HENT: Positive for hearing loss. Negative for mouth sores and trouble swallowing.   Eyes: Positive for visual disturbance.  Respiratory: Negative for cough and shortness of breath.   Cardiovascular: Negative for chest pain and leg swelling.  Gastrointestinal: Negative for abdominal pain, diarrhea, nausea and vomiting.  Genitourinary: Negative for dysuria.       Currently on antibiotic treatment for UTI. Has urinary incontinence.   Musculoskeletal: Positive for arthralgias, back pain and gait problem.  Neurological: Positive for dizziness and weakness. Negative for headaches.       Occasional dizziness with position change  Psychiatric/Behavioral: Positive for behavioral problems and confusion.    Vitals:   04/26/18 1332  BP: 134/80  Pulse: 78  Resp: 20  Temp: 97.6 F (36.4 C)  SpO2: 94%  Weight: 129 lb 8 oz (58.7 kg)  Height: '5\' 6"'$  (1.676 m)   Body mass index is 20.9 kg/m. Physical Exam  Constitutional:  Thin built, frail, elderly female, in no distress  HENT:  Head: Normocephalic and atraumatic.  Nose: Nose normal.  Mouth/Throat: Oropharynx is clear and moist. No oropharyngeal exudate.  Eyes: Pupils are equal, round, and reactive to light. Conjunctivae and EOM  are normal. Right eye exhibits no discharge. Left eye exhibits no discharge.  Has corrective glasses  Neck: Normal range of motion. Neck supple.  Cardiovascular:  Murmur heard. Irregular heart rate  Pulmonary/Chest: Effort normal and breath sounds normal. She has no wheezes. She has no rales.  Abdominal: Soft. Bowel sounds are normal. There is no tenderness. There is no guarding.  Musculoskeletal: She exhibits no edema.  Can move all 4 extremities, uses walker and need 1 person assistance with transfer, thoracic and lumbar spine tenderness on palpation, right paraspinal area mid back region on posterior rib cage- tenderness on palpation and bruising noted  Lymphadenopathy:    She has no cervical adenopathy.  Neurological: She is alert.  Oriented only to self  Skin: Skin is warm and dry. She is not diaphoretic.  Scratch mark to right mid back area and resolving bruise  Psychiatric:  Pleasantly confused, poor safety awareness, tried to get out of chair by herself without assistive device this visit.     Labs reviewed: Basic Metabolic Panel: Recent Labs    12/11/17 0807  01/29/18 04/17/18 04/20/18 1337 04/25/18  NA 139   < > 141 143 144 135*  K 3.2*   < > 3.7 4.0 3.3* 3.4  CL 95*   < > 100 100 97* 93  CO2 35*   < > 33 33 35* 35  GLUCOSE 119*  --   --   --  121*  --   BUN 30*   < > 33* 39* 33* 29*  CREATININE 1.23*   < > 1.2* 1.4* 1.24* 1.2*  CALCIUM 10.0   < >  9.7 10.1 10.6* 8.9  MG  --   --  1.7  --   --   --    < > = values in this interval not displayed.   Liver Function Tests: Recent Labs    04/17/18 04/20/18 1337 04/25/18  AST '17 24 14  '$ ALT '9 16 8  '$ ALKPHOS 37 47 40  BILITOT  --  0.7  --   PROT 5.6 7.9 5.5  ALBUMIN 3.3 4.1 3.2   No results for input(s): LIPASE, AMYLASE in the last 8760 hours. No results for input(s): AMMONIA in the last 8760 hours. CBC: Recent Labs    12/05/17 12/11/17 0625  04/17/18 04/20/18 1337 04/25/18  WBC 8.8 10.1   < > 9.1 12.8* 11.5    NEUTROABS 4,928 7.4  --   --  10.7*  --   HGB 12.4 14.2   < > 13.2 15.5* 11.9*  HCT 36 42.0   < > 40 46.7* 34*  MCV  --  90.3  --   --  91.2  --   PLT 188 247   < > 254 269 221   < > = values in this interval not displayed.   Cardiac Enzymes: No results for input(s): CKTOTAL, CKMB, CKMBINDEX, TROPONINI in the last 8760 hours. BNP: Invalid input(s): POCBNP No results found for: HGBA1C Lab Results  Component Value Date   TSH 0.90 07/12/2017   No results found for: VITAMINB12 No results found for: FOLATE No results found for: IRON, TIBC, FERRITIN  Imaging and Procedures obtained prior to SNF admission: Ct Head Wo Contrast  Result Date: 04/20/2018 CLINICAL DATA:  Mental status changes.  Known UTI. EXAM: CT HEAD WITHOUT CONTRAST TECHNIQUE: Contiguous axial images were obtained from the base of the skull through the vertex without intravenous contrast. COMPARISON:  12/11/2017 FINDINGS: Brain: Stable age related cerebral atrophy, ventriculomegaly and periventricular white matter disease. No extra-axial fluid collections are identified. No CT findings for acute hemispheric infarction or intracranial hemorrhage. No mass lesions. The brainstem and cerebellum are normal. Vascular: No hyperdense vessel or unexpected calcification. Skull: No acute skull fracture or worrisome bone lesion. Stable prominent arachnoid granulations involving the occipital bone. Sinuses/Orbits: The paranasal sinuses and mastoid air cells are grossly clear. Other: No scalp hematoma obvious laceration. IMPRESSION: 1. Age related and stable appearing cerebral atrophy, ventriculomegaly and extensive periventricular white matter disease. 2. No acute intracranial findings or extra-axial fluid collections. 3. No bone lesions or fracture. Electronically Signed   By: Marijo Sanes M.D.   On: 04/20/2018 14:43    Assessment/Plan   1. Hypertension Continue chlorthalidone. Check BP bid for 2 weeks with concern for hypotension with  recent fall. Hold chlorthalidone if SBP <110  2. Mixed Alzheimer's and vascular dementia Supportive care, SLP consult  3. CKD (chronic kidney disease) stage 3, GFR 30-59 ml/min (HCC) Monitor renal function, maintain hydration  4. Acute cystitis without hematuria Asymptomatic this visit. Continue and complete keflex on 04/27/18, continue florastor, encourage and maintain hydration, maintain perineal hygiene  5. Leukocytosis, unspecified type Likely with her UTI, continue and complete antibiotic treatment. Monitor cbc with diff.   6. Hyponatremia Maintain hydration. Check bmp  7. Hypokalemia Encouraged to take her kcl supplement. Check bmp  8. Physical deconditioning Will have patient work with PT/OT as tolerated to regain strength and restore function.  Fall precautions are in place.  9. Unsteady gait Walker and assistance with transfer. Will have her work with physical therapy and occupational therapy team to  help with gait training and muscle strengthening exercises.fall precautions. Skin care. Encourage to be out of bed.   10. Tremor, essential Continue propranolol  11. Back pain due to injury Post fall with fracture of indeterminate age, ortho referral and pain management as below.   12. Closed wedge compression fracture of twelfth thoracic vertebra, initial encounter ([place]) Post fall. Pain with movement and palpation. Orthopedic referral made. Start Tylenol extra strength 1000 mg bid and 500 mg q8h prn pain. Orthopedic referral. Back precautions. Continue calcium and vitamin d supplement. Remains high risk for another fall and fracture.    Family/ staff Communication: reviewed care plan with patient and charge nurse.    Labs/tests ordered: cbc with diff, BMP with eGFR  Blanchie Serve, MD Internal Medicine [facility] [address] [place], [place] [address] Cell Phone (Monday-Friday 8 am - 5 pm): [phone] On Call: [phone] and  follow prompts after 5 pm and on weekends Office Phone: [phone] Office Fax: [phone]

## 2018-04-29 ENCOUNTER — Other Ambulatory Visit: Payer: Self-pay | Admitting: *Deleted

## 2018-04-29 DIAGNOSIS — Z7901 Long term (current) use of anticoagulants: Secondary | ICD-10-CM | POA: Diagnosis not present

## 2018-04-29 DIAGNOSIS — R1312 Dysphagia, oropharyngeal phase: Secondary | ICD-10-CM | POA: Diagnosis not present

## 2018-04-29 DIAGNOSIS — M545 Low back pain: Secondary | ICD-10-CM | POA: Diagnosis not present

## 2018-04-29 DIAGNOSIS — S32019D Unspecified fracture of first lumbar vertebra, subsequent encounter for fracture with routine healing: Secondary | ICD-10-CM | POA: Diagnosis not present

## 2018-04-29 DIAGNOSIS — R2681 Unsteadiness on feet: Secondary | ICD-10-CM | POA: Diagnosis not present

## 2018-04-29 DIAGNOSIS — R278 Other lack of coordination: Secondary | ICD-10-CM | POA: Diagnosis not present

## 2018-04-29 DIAGNOSIS — M6281 Muscle weakness (generalized): Secondary | ICD-10-CM | POA: Diagnosis not present

## 2018-04-29 LAB — POCT INR: INR: 2 — AB (ref <=1.1)

## 2018-04-29 LAB — PROTIME-INR: PROTIME: 20.7 — AB (ref 10.0–13.8)

## 2018-04-30 ENCOUNTER — Other Ambulatory Visit: Payer: Self-pay | Admitting: *Deleted

## 2018-04-30 DIAGNOSIS — S32019D Unspecified fracture of first lumbar vertebra, subsequent encounter for fracture with routine healing: Secondary | ICD-10-CM | POA: Diagnosis not present

## 2018-04-30 DIAGNOSIS — R1312 Dysphagia, oropharyngeal phase: Secondary | ICD-10-CM | POA: Diagnosis not present

## 2018-04-30 DIAGNOSIS — M545 Low back pain: Secondary | ICD-10-CM | POA: Diagnosis not present

## 2018-04-30 DIAGNOSIS — M6281 Muscle weakness (generalized): Secondary | ICD-10-CM | POA: Diagnosis not present

## 2018-04-30 DIAGNOSIS — R2681 Unsteadiness on feet: Secondary | ICD-10-CM | POA: Diagnosis not present

## 2018-04-30 DIAGNOSIS — R278 Other lack of coordination: Secondary | ICD-10-CM | POA: Diagnosis not present

## 2018-05-01 DIAGNOSIS — N183 Chronic kidney disease, stage 3 (moderate): Secondary | ICD-10-CM | POA: Diagnosis not present

## 2018-05-01 DIAGNOSIS — M545 Low back pain: Secondary | ICD-10-CM | POA: Diagnosis not present

## 2018-05-01 DIAGNOSIS — R278 Other lack of coordination: Secondary | ICD-10-CM | POA: Diagnosis not present

## 2018-05-01 DIAGNOSIS — R1312 Dysphagia, oropharyngeal phase: Secondary | ICD-10-CM | POA: Diagnosis not present

## 2018-05-01 DIAGNOSIS — S32019D Unspecified fracture of first lumbar vertebra, subsequent encounter for fracture with routine healing: Secondary | ICD-10-CM | POA: Diagnosis not present

## 2018-05-01 DIAGNOSIS — D72829 Elevated white blood cell count, unspecified: Secondary | ICD-10-CM | POA: Diagnosis not present

## 2018-05-01 DIAGNOSIS — R2681 Unsteadiness on feet: Secondary | ICD-10-CM | POA: Diagnosis not present

## 2018-05-01 DIAGNOSIS — M6281 Muscle weakness (generalized): Secondary | ICD-10-CM | POA: Diagnosis not present

## 2018-05-01 LAB — CBC AND DIFFERENTIAL
HEMATOCRIT: 38 (ref 36–46)
Hemoglobin: 12.9 (ref 12.0–16.0)
PLATELETS: 275 (ref 150–399)
WBC: 8.9

## 2018-05-02 ENCOUNTER — Ambulatory Visit (INDEPENDENT_AMBULATORY_CARE_PROVIDER_SITE_OTHER): Payer: Medicare Other

## 2018-05-02 ENCOUNTER — Ambulatory Visit (INDEPENDENT_AMBULATORY_CARE_PROVIDER_SITE_OTHER): Payer: Medicare Other | Admitting: Orthopaedic Surgery

## 2018-05-02 ENCOUNTER — Encounter (INDEPENDENT_AMBULATORY_CARE_PROVIDER_SITE_OTHER): Payer: Self-pay | Admitting: Orthopaedic Surgery

## 2018-05-02 ENCOUNTER — Other Ambulatory Visit: Payer: Self-pay | Admitting: *Deleted

## 2018-05-02 DIAGNOSIS — M545 Low back pain: Secondary | ICD-10-CM | POA: Diagnosis not present

## 2018-05-02 DIAGNOSIS — R278 Other lack of coordination: Secondary | ICD-10-CM | POA: Diagnosis not present

## 2018-05-02 DIAGNOSIS — D72829 Elevated white blood cell count, unspecified: Secondary | ICD-10-CM | POA: Diagnosis not present

## 2018-05-02 DIAGNOSIS — M549 Dorsalgia, unspecified: Secondary | ICD-10-CM | POA: Diagnosis not present

## 2018-05-02 DIAGNOSIS — I4891 Unspecified atrial fibrillation: Secondary | ICD-10-CM | POA: Diagnosis not present

## 2018-05-02 DIAGNOSIS — S32019D Unspecified fracture of first lumbar vertebra, subsequent encounter for fracture with routine healing: Secondary | ICD-10-CM | POA: Diagnosis not present

## 2018-05-02 DIAGNOSIS — N183 Chronic kidney disease, stage 3 (moderate): Secondary | ICD-10-CM | POA: Diagnosis not present

## 2018-05-02 DIAGNOSIS — R2681 Unsteadiness on feet: Secondary | ICD-10-CM | POA: Diagnosis not present

## 2018-05-02 DIAGNOSIS — R1312 Dysphagia, oropharyngeal phase: Secondary | ICD-10-CM | POA: Diagnosis not present

## 2018-05-02 DIAGNOSIS — M6281 Muscle weakness (generalized): Secondary | ICD-10-CM | POA: Diagnosis not present

## 2018-05-02 NOTE — Progress Notes (Signed)
Office Visit Note   Patient: Angelica Mejia           Date of Birth: 12/14/1918           MRN: [id number] Visit Date: 05/02/2018              Requested by: Blanchie Serve, MD [address] [place], [place] [address] PCP: Blanchie Serve, MD   Assessment & Plan: Visit Diagnoses:  1. Mid back pain     Plan: Impression is T12 and L1 fractures.  These will be amenable to conservative treatment.  Patient already appears to be much improved.  We will continue to watch this.  She will continue with her physical therapy at friend's home.  She will follow-up with [place] in 6 weeks time for repeat evaluation and x-ray.   Follow-Up Instructions: Return in about 6 weeks (around 06/13/2018).   Orders:  Orders Placed This Encounter  Procedures  . XR Thoracic Spine 2 View   No orders of the defined types were placed in this encounter.     Procedures: No procedures performed   Clinical Data: No additional findings.   Subjective: Chief Complaint  Patient presents with  . Middle Back - Pain    HPI patient is a pleasant 84 year old female who currently has Alzheimer's and resides at a friend's home and is here today for follow-up of mid back pain.  On 04/24/2018 she fell.  She was seen her x-rays were obtained.  These showed an anterior wedge fracture at T12 as well as a compression fracture at L1.  Her pain appears to have improved.  It is hard to get a great history based on her mental status.  There is no one else here today with her other than the driver from friends home who she just met this morning.  Review of Systems as detailed in HPI.  All others reviewed and are negative.   Objective: Vital Signs: There were no vitals taken for this visit.  Physical Exam well-developed and well-nourished female in no acute distress.  Ortho Exam examination of her spine reveals no tenderness to the thoracic or lumbar areas.  No paraspinous tenderness.  No pain with flexion or extension of the  back.  Specialty Comments:  No specialty comments available.  Imaging: Xr Thoracic Spine 2 View  Result Date: 05/02/2018 X-rays show fractures T12 and L1.  Minimal healing.    PMFS History: Patient Active Problem List   Diagnosis Date Noted  . Wedge compression fracture of T12 vertebra ([place]) 04/26/2018  . Mid back pain 04/24/2018  . Leukocytosis 04/22/2018  . Ecchymosis 04/22/2018  . Urinary urgency 04/16/2018  . Urinary frequency 04/16/2018  . UTI (urinary tract infection) 12/12/2017  . Fall 12/12/2017  . Urinary incontinence 12/04/2017  . Dry eye syndrome of both eyes 10/22/2017  . Mixed Alzheimer's and vascular dementia 10/16/2017  . CKD (chronic kidney disease) stage 3, GFR 30-59 ml/min (HCC) 10/09/2017  . Neuropathy 10/09/2017  . Hypokalemia 10/09/2017  . Anemia, chronic disease 07/11/2017  . History of fracture of clavicle 05/16/2017  . Encounter for therapeutic drug monitoring 04/19/2017  . Chronic a-fib ([place])   . Tremor, essential   . Chronic anticoagulation   . Hypertension   . Raynaud phenomenon    Past Medical History:  Diagnosis Date  . Anticoagulant long-term use   . Atrial fibrillation ([place])   . CHF (congestive heart failure) ([place])   . Chronic anticoagulation   . Hypertension   .  Raynaud phenomenon   . Tremor, essential     Family History  Problem Relation Age of Onset  . Hypertension Father   . Heart attack Father   . Heart disease Unknown   . Hypertension Unknown   . Stroke Unknown   . Cancer Unknown     Past Surgical History:  Procedure Laterality Date  . CHOLECYSTECTOMY    . TONSILLECTOMY     Social History   Occupational History  . Occupation: Retired- Land  Tobacco Use  . Smoking status: Never Smoker  . Smokeless tobacco: Never Used  Substance and Sexual Activity  . Alcohol use: No  . Drug use: No  . Sexual activity: Not Currently

## 2018-05-03 DIAGNOSIS — M6281 Muscle weakness (generalized): Secondary | ICD-10-CM | POA: Diagnosis not present

## 2018-05-03 DIAGNOSIS — S32019D Unspecified fracture of first lumbar vertebra, subsequent encounter for fracture with routine healing: Secondary | ICD-10-CM | POA: Diagnosis not present

## 2018-05-03 DIAGNOSIS — R1312 Dysphagia, oropharyngeal phase: Secondary | ICD-10-CM | POA: Diagnosis not present

## 2018-05-03 DIAGNOSIS — R278 Other lack of coordination: Secondary | ICD-10-CM | POA: Diagnosis not present

## 2018-05-03 DIAGNOSIS — M545 Low back pain: Secondary | ICD-10-CM | POA: Diagnosis not present

## 2018-05-03 DIAGNOSIS — R2681 Unsteadiness on feet: Secondary | ICD-10-CM | POA: Diagnosis not present

## 2018-05-06 DIAGNOSIS — R1312 Dysphagia, oropharyngeal phase: Secondary | ICD-10-CM | POA: Diagnosis not present

## 2018-05-06 DIAGNOSIS — M545 Low back pain: Secondary | ICD-10-CM | POA: Diagnosis not present

## 2018-05-06 DIAGNOSIS — M6281 Muscle weakness (generalized): Secondary | ICD-10-CM | POA: Diagnosis not present

## 2018-05-06 DIAGNOSIS — S32019D Unspecified fracture of first lumbar vertebra, subsequent encounter for fracture with routine healing: Secondary | ICD-10-CM | POA: Diagnosis not present

## 2018-05-06 DIAGNOSIS — R2681 Unsteadiness on feet: Secondary | ICD-10-CM | POA: Diagnosis not present

## 2018-05-06 DIAGNOSIS — R278 Other lack of coordination: Secondary | ICD-10-CM | POA: Diagnosis not present

## 2018-05-07 DIAGNOSIS — I480 Paroxysmal atrial fibrillation: Secondary | ICD-10-CM | POA: Diagnosis not present

## 2018-05-07 DIAGNOSIS — Z7901 Long term (current) use of anticoagulants: Secondary | ICD-10-CM | POA: Diagnosis not present

## 2018-05-08 DIAGNOSIS — M6281 Muscle weakness (generalized): Secondary | ICD-10-CM | POA: Diagnosis not present

## 2018-05-08 DIAGNOSIS — R41841 Cognitive communication deficit: Secondary | ICD-10-CM | POA: Diagnosis not present

## 2018-05-08 DIAGNOSIS — R2681 Unsteadiness on feet: Secondary | ICD-10-CM | POA: Diagnosis not present

## 2018-05-08 DIAGNOSIS — Z9181 History of falling: Secondary | ICD-10-CM | POA: Diagnosis not present

## 2018-05-08 DIAGNOSIS — R1312 Dysphagia, oropharyngeal phase: Secondary | ICD-10-CM | POA: Diagnosis not present

## 2018-05-08 DIAGNOSIS — M545 Low back pain: Secondary | ICD-10-CM | POA: Diagnosis not present

## 2018-05-08 DIAGNOSIS — N390 Urinary tract infection, site not specified: Secondary | ICD-10-CM | POA: Diagnosis not present

## 2018-05-09 DIAGNOSIS — M545 Low back pain: Secondary | ICD-10-CM | POA: Diagnosis not present

## 2018-05-09 DIAGNOSIS — Z9181 History of falling: Secondary | ICD-10-CM | POA: Diagnosis not present

## 2018-05-09 DIAGNOSIS — R1312 Dysphagia, oropharyngeal phase: Secondary | ICD-10-CM | POA: Diagnosis not present

## 2018-05-09 DIAGNOSIS — N390 Urinary tract infection, site not specified: Secondary | ICD-10-CM | POA: Diagnosis not present

## 2018-05-09 DIAGNOSIS — M6281 Muscle weakness (generalized): Secondary | ICD-10-CM | POA: Diagnosis not present

## 2018-05-09 DIAGNOSIS — R2681 Unsteadiness on feet: Secondary | ICD-10-CM | POA: Diagnosis not present

## 2018-05-13 ENCOUNTER — Encounter: Payer: Self-pay | Admitting: Nurse Practitioner

## 2018-05-13 ENCOUNTER — Non-Acute Institutional Stay (SKILLED_NURSING_FACILITY): Payer: Medicare Other | Admitting: Nurse Practitioner

## 2018-05-13 DIAGNOSIS — F0150 Vascular dementia without behavioral disturbance: Secondary | ICD-10-CM

## 2018-05-13 DIAGNOSIS — M6281 Muscle weakness (generalized): Secondary | ICD-10-CM | POA: Diagnosis not present

## 2018-05-13 DIAGNOSIS — R2681 Unsteadiness on feet: Secondary | ICD-10-CM | POA: Diagnosis not present

## 2018-05-13 DIAGNOSIS — R609 Edema, unspecified: Secondary | ICD-10-CM

## 2018-05-13 DIAGNOSIS — G309 Alzheimer's disease, unspecified: Secondary | ICD-10-CM

## 2018-05-13 DIAGNOSIS — F0280 Dementia in other diseases classified elsewhere without behavioral disturbance: Secondary | ICD-10-CM

## 2018-05-13 DIAGNOSIS — R112 Nausea with vomiting, unspecified: Secondary | ICD-10-CM | POA: Diagnosis not present

## 2018-05-13 DIAGNOSIS — R1312 Dysphagia, oropharyngeal phase: Secondary | ICD-10-CM | POA: Diagnosis not present

## 2018-05-13 DIAGNOSIS — M545 Low back pain: Secondary | ICD-10-CM | POA: Diagnosis not present

## 2018-05-13 DIAGNOSIS — I482 Chronic atrial fibrillation, unspecified: Secondary | ICD-10-CM | POA: Diagnosis not present

## 2018-05-13 DIAGNOSIS — N390 Urinary tract infection, site not specified: Secondary | ICD-10-CM | POA: Diagnosis not present

## 2018-05-13 DIAGNOSIS — Z9181 History of falling: Secondary | ICD-10-CM | POA: Diagnosis not present

## 2018-05-13 NOTE — Progress Notes (Signed)
Location:  [facility] Room Number: 31 Place of Service:  SNF (31) Provider:  Doyle Kunath, ManXie  NP  Oneal Grout, MD  Patient Care Team: Oneal Grout, MD as PCP - General (Internal Medicine) Nahser, Deloris Ping, MD as PCP - Cardiology (Cardiology)  Extended Emergency Contact Information Primary Emergency Contact: Morgan,Tom Address: [address]          [place], [place] [address] [place] of [place] Home Phone: [phone] Relation: Friend Secondary Emergency Contact: Brandy Hale [place] of [place] Home Phone: [phone] Relation: Niece  Code Status:  DNR Goals of care: Advanced Directive information Advanced Directives 05/13/2018  Does Patient Have a Medical Advance Directive? Yes  Type of Estate agent of [place];Out of facility DNR (pink MOST or yellow form)  Does patient want to make changes to medical advance directive? No - Patient declined  Copy of Healthcare Power of Attorney in Chart? Yes  Would patient like information on creating a medical advance directive? -  Pre-existing out of facility DNR order (yellow form or pink MOST form) Yellow form placed in chart (order not valid for inpatient use)     Chief Complaint  Patient presents with  . Acute Visit    Increased edema to both feet.    HPI:  Pt is a 84 y.o. female seen today for an acute visit for increased swelling in ankles, weights # 130Ibs 05/08/18, 129 Ibs 04/26/18, taking Chlorthalidone 25mg  qd.  No O2 desaturation, she denied chest pain/pressure or palpitation. Hx of Afib, heart rate is in control, on Coumadin, Propranolol 80mg  qd. The patient reported nausea, vomiting, but denied abd pain, constipation, or diarrhea. She is afebrile. HPI was provided with assistance of staff.    Past Medical History:  Diagnosis Date  . Anticoagulant long-term use   . Atrial fibrillation (HCC)   . CHF (congestive heart failure) (HCC)   . Chronic  anticoagulation   . Hypertension   . Raynaud phenomenon   . Tremor, essential    Past Surgical History:  Procedure Laterality Date  . CHOLECYSTECTOMY    . TONSILLECTOMY      Allergies  Allergen Reactions  . Fosamax [Alendronate]   . Other Nausea And Vomiting    Green pepper    Outpatient Encounter Medications as of 05/13/2018  Medication Sig  . acetaminophen (TYLENOL) 325 MG tablet Take 650 mg by mouth 2 (two) times daily.   . Calcium Carbonate-Vit D-Min (CALTRATE 600+D PLUS MINERALS) 600-800 MG-UNIT TABS Take 1 tablet by mouth 2 (two) times daily.   . chlorthalidone (HYGROTON) 25 MG tablet Take 25 mg by mouth daily.   . feeding supplement (BOOST / RESOURCE BREEZE) LIQD Take 1 Container by mouth 2 (two) times daily between meals.  . hydroxypropyl methylcellulose / hypromellose (ISOPTO TEARS / GONIOVISC) 2.5 % ophthalmic solution Place 1 drop into both eyes 3 (three) times daily.  . potassium chloride (K-DUR) 10 MEQ tablet Take 20 mEq by mouth daily.  . propranolol ER (INDERAL LA) 80 MG 24 hr capsule TAKE 1 CAPSULE BY MOUTH  DAILY  . warfarin (COUMADIN) 2 MG tablet Take 2 mg by mouth 2 (two) times daily.  . [DISCONTINUED] potassium chloride (MICRO-K) 10 MEQ CR capsule Take 20 mEq by mouth 2 (two) times daily.  . [DISCONTINUED] cephALEXin (KEFLEX) 250 MG capsule Take 1 capsule (250 mg total) by mouth 4 (four) times daily.  . [DISCONTINUED] saccharomyces boulardii (FLORASTOR) 250 MG capsule Take 250 mg by mouth  2 (two) times daily.   No facility-administered encounter medications on file as of 05/13/2018.    ROS was provided with assistance of staff Review of Systems  Constitutional: Positive for activity change, appetite change and fatigue. Negative for chills, diaphoresis, fever and unexpected weight change.  HENT: Positive for hearing loss. Negative for congestion and voice change.   Respiratory: Negative for cough, chest tightness, shortness of breath and wheezing.     Cardiovascular: Positive for leg swelling. Negative for chest pain and palpitations.  Gastrointestinal: Positive for nausea and vomiting. Negative for abdominal distention, abdominal pain, constipation and diarrhea.  Genitourinary: Negative for difficulty urinating, dysuria and urgency.  Musculoskeletal: Positive for arthralgias, back pain and gait problem.  Skin: Negative for color change and pallor.  Neurological: Positive for tremors. Negative for dizziness, speech difficulty, weakness and headaches.       Dementia  Psychiatric/Behavioral: Positive for confusion. Negative for agitation, behavioral problems, hallucinations and sleep disturbance. The patient is not nervous/anxious.     Immunization History  Administered Date(s) Administered  . Influenza-Unspecified 05/28/2017  . Pneumococcal Conjugate-13 07/13/2017  . Tdap 07/13/2017   Pertinent  Health Maintenance Due  Topic Date Due  . INFLUENZA VACCINE  03/07/2018  . PNA vac Low Risk Adult (2 of 2 - PPSV23) 07/13/2018  . DEXA SCAN  Completed   Fall Risk  10/05/2017  Falls in the past year? No   Functional Status Survey:    Vitals:   05/13/18 1341  BP: 118/62  Pulse: 72  Resp: 20  Temp: 98 F (36.7 C)  SpO2: 95%  Weight: 130 lb 4.8 oz (59.1 kg)  Height: 5\' 4"  (1.626 m)   Body mass index is 22.37 kg/m. Physical Exam  Constitutional: She appears well-developed and well-nourished.  HENT:  Head: Normocephalic and atraumatic.  Eyes: Pupils are equal, round, and reactive to light. EOM are normal.  Neck: Normal range of motion. Neck supple. No JVD present. No thyromegaly present.  Cardiovascular: Normal rate.  Murmur heard. Irregular heart beats.   Pulmonary/Chest: Effort normal. She has no wheezes. She has no rales.  Abdominal: Soft. She exhibits no distension. There is no tenderness. There is no rebound and no guarding.  Musculoskeletal: She exhibits edema. She exhibits no tenderness.  Mainly in ankles. Needs  assistance with transfer. Ambulates with walker.   Neurological: She is alert. No cranial nerve deficit. She exhibits normal muscle tone. Coordination normal.  Oriented to person and her room on unit.   Skin: Skin is warm and dry.  Psychiatric: She has a normal mood and affect. Her behavior is normal.    Labs reviewed: Recent Labs    12/11/17 0807  01/29/18 04/17/18 04/20/18 1337 04/25/18  NA 139   < > 141 143 144 135*  K 3.2*   < > 3.7 4.0 3.3* 3.4  CL 95*   < > 100 100 97* 93  CO2 35*   < > 33 33 35* 35  GLUCOSE 119*  --   --   --  121*  --   BUN 30*   < > 33* 39* 33* 29*  CREATININE 1.23*   < > 1.2* 1.4* 1.24* 1.2*  CALCIUM 10.0   < > 9.7 10.1 10.6* 8.9  MG  --   --  1.7  --   --   --    < > = values in this interval not displayed.   Recent Labs    04/17/18 04/20/18 1337 04/25/18  AST 17  24 14  ALT 9 16 8   ALKPHOS 37 47 40  BILITOT  --  0.7  --   PROT 5.6 7.9 5.5  ALBUMIN 3.3 4.1 3.2   Recent Labs    12/05/17  12/11/17 0625  04/20/18 1337 04/25/18 05/01/18  WBC 8.8   < > 10.1   < > 12.8* 11.5 8.9  NEUTROABS 4,928  --  7.4  --  10.7*  --   --   HGB 12.4  --  14.2   < > 15.5* 11.9* 12.9  HCT 36  --  42.0   < > 46.7* 34* 38  MCV  --   --  90.3  --  91.2  --   --   PLT 188  --  247   < > 269 221 275   < > = values in this interval not displayed.   Lab Results  Component Value Date   TSH 0.90 07/12/2017   No results found for: HGBA1C No results found for: CHOL, HDL, LDLCALC, LDLDIRECT, TRIG, CHOLHDL  Significant Diagnostic Results in last 30 days:  Ct Head Wo Contrast  Result Date: 04/20/2018 CLINICAL DATA:  Mental status changes.  Known UTI. EXAM: CT HEAD WITHOUT CONTRAST TECHNIQUE: Contiguous axial images were obtained from the base of the skull through the vertex without intravenous contrast. COMPARISON:  12/11/2017 FINDINGS: Brain: Stable age related cerebral atrophy, ventriculomegaly and periventricular white matter disease. No extra-axial fluid collections  are identified. No CT findings for acute hemispheric infarction or intracranial hemorrhage. No mass lesions. The brainstem and cerebellum are normal. Vascular: No hyperdense vessel or unexpected calcification. Skull: No acute skull fracture or worrisome bone lesion. Stable prominent arachnoid granulations involving the occipital bone. Sinuses/Orbits: The paranasal sinuses and mastoid air cells are grossly clear. Other: No scalp hematoma obvious laceration. IMPRESSION: 1. Age related and stable appearing cerebral atrophy, ventriculomegaly and extensive periventricular white matter disease. 2. No acute intracranial findings or extra-axial fluid collections. 3. No bone lesions or fracture. Electronically Signed   By: Rudie Meyer M.D.   On: 04/20/2018 14:43   Xr Thoracic Spine 2 View  Result Date: 05/02/2018 X-rays show fractures T12 and L1.  Minimal healing.   Assessment/Plan Edema increased swelling in ankles, weights # 130Ibs 05/08/18, 129 Ibs 04/26/18, continue  Chlorthalidone 25mg  qd.  No O2 desaturation, she denied chest pain/pressure or palpitation. Will obtain echocardiogram. Weight daily ac breakfast x 1 week.   Nausea & vomiting Will update CBC/diff CMP, observe the patient.   Chronic a-fib (HCC) Hx of Afib, heart rate is in control, continue  Coumadin, Propranolol 80mg  qd.  Mixed Alzheimer's and vascular dementia Continue [facility] for safety and care assistance.      Family/ staff Communication: plan of care reviewed with the patient and charge nurse.   Labs/tests ordered:  CBC/diff, CMP, echocardiogram.   Time spend 25 minutes.

## 2018-05-13 NOTE — Assessment & Plan Note (Signed)
Will update CBC/diff CMP, observe the patient.

## 2018-05-13 NOTE — Assessment & Plan Note (Signed)
Hx of Afib, heart rate is in control, continue  Coumadin, Propranolol 80mg  qd.

## 2018-05-13 NOTE — Assessment & Plan Note (Signed)
Continue [facility] for safety and care assistance.  

## 2018-05-13 NOTE — Assessment & Plan Note (Signed)
increased swelling in ankles, weights # 130Ibs 05/08/18, 129 Ibs 04/26/18, continue  Chlorthalidone 25mg  qd.  No O2 desaturation, she denied chest pain/pressure or palpitation. Will obtain echocardiogram. Weight daily ac breakfast x 1 week.

## 2018-05-14 ENCOUNTER — Encounter: Payer: Self-pay | Admitting: Nurse Practitioner

## 2018-05-14 DIAGNOSIS — R2681 Unsteadiness on feet: Secondary | ICD-10-CM | POA: Diagnosis not present

## 2018-05-14 DIAGNOSIS — Z9181 History of falling: Secondary | ICD-10-CM | POA: Diagnosis not present

## 2018-05-14 DIAGNOSIS — M6281 Muscle weakness (generalized): Secondary | ICD-10-CM | POA: Diagnosis not present

## 2018-05-14 DIAGNOSIS — R112 Nausea with vomiting, unspecified: Secondary | ICD-10-CM | POA: Diagnosis not present

## 2018-05-14 DIAGNOSIS — N390 Urinary tract infection, site not specified: Secondary | ICD-10-CM | POA: Diagnosis not present

## 2018-05-14 DIAGNOSIS — R1312 Dysphagia, oropharyngeal phase: Secondary | ICD-10-CM | POA: Diagnosis not present

## 2018-05-14 DIAGNOSIS — M545 Low back pain: Secondary | ICD-10-CM | POA: Diagnosis not present

## 2018-05-14 DIAGNOSIS — I4891 Unspecified atrial fibrillation: Secondary | ICD-10-CM | POA: Diagnosis not present

## 2018-05-15 DIAGNOSIS — Z9181 History of falling: Secondary | ICD-10-CM | POA: Diagnosis not present

## 2018-05-15 DIAGNOSIS — M545 Low back pain: Secondary | ICD-10-CM | POA: Diagnosis not present

## 2018-05-15 DIAGNOSIS — M6281 Muscle weakness (generalized): Secondary | ICD-10-CM | POA: Diagnosis not present

## 2018-05-15 DIAGNOSIS — R1312 Dysphagia, oropharyngeal phase: Secondary | ICD-10-CM | POA: Diagnosis not present

## 2018-05-15 DIAGNOSIS — R2681 Unsteadiness on feet: Secondary | ICD-10-CM | POA: Diagnosis not present

## 2018-05-15 DIAGNOSIS — N390 Urinary tract infection, site not specified: Secondary | ICD-10-CM | POA: Diagnosis not present

## 2018-05-16 DIAGNOSIS — R2681 Unsteadiness on feet: Secondary | ICD-10-CM | POA: Diagnosis not present

## 2018-05-16 DIAGNOSIS — M6281 Muscle weakness (generalized): Secondary | ICD-10-CM | POA: Diagnosis not present

## 2018-05-16 DIAGNOSIS — M545 Low back pain: Secondary | ICD-10-CM | POA: Diagnosis not present

## 2018-05-16 DIAGNOSIS — N390 Urinary tract infection, site not specified: Secondary | ICD-10-CM | POA: Diagnosis not present

## 2018-05-16 DIAGNOSIS — R1312 Dysphagia, oropharyngeal phase: Secondary | ICD-10-CM | POA: Diagnosis not present

## 2018-05-16 DIAGNOSIS — Z9181 History of falling: Secondary | ICD-10-CM | POA: Diagnosis not present

## 2018-05-17 DIAGNOSIS — N390 Urinary tract infection, site not specified: Secondary | ICD-10-CM | POA: Diagnosis not present

## 2018-05-17 DIAGNOSIS — R2681 Unsteadiness on feet: Secondary | ICD-10-CM | POA: Diagnosis not present

## 2018-05-17 DIAGNOSIS — Z9181 History of falling: Secondary | ICD-10-CM | POA: Diagnosis not present

## 2018-05-17 DIAGNOSIS — M6281 Muscle weakness (generalized): Secondary | ICD-10-CM | POA: Diagnosis not present

## 2018-05-17 DIAGNOSIS — R1312 Dysphagia, oropharyngeal phase: Secondary | ICD-10-CM | POA: Diagnosis not present

## 2018-05-17 DIAGNOSIS — M545 Low back pain: Secondary | ICD-10-CM | POA: Diagnosis not present

## 2018-05-19 DIAGNOSIS — R1312 Dysphagia, oropharyngeal phase: Secondary | ICD-10-CM | POA: Diagnosis not present

## 2018-05-19 DIAGNOSIS — M6281 Muscle weakness (generalized): Secondary | ICD-10-CM | POA: Diagnosis not present

## 2018-05-19 DIAGNOSIS — M545 Low back pain: Secondary | ICD-10-CM | POA: Diagnosis not present

## 2018-05-19 DIAGNOSIS — N390 Urinary tract infection, site not specified: Secondary | ICD-10-CM | POA: Diagnosis not present

## 2018-05-19 DIAGNOSIS — R2681 Unsteadiness on feet: Secondary | ICD-10-CM | POA: Diagnosis not present

## 2018-05-19 DIAGNOSIS — Z9181 History of falling: Secondary | ICD-10-CM | POA: Diagnosis not present

## 2018-05-20 DIAGNOSIS — M545 Low back pain: Secondary | ICD-10-CM | POA: Diagnosis not present

## 2018-05-20 DIAGNOSIS — M6281 Muscle weakness (generalized): Secondary | ICD-10-CM | POA: Diagnosis not present

## 2018-05-20 DIAGNOSIS — R2681 Unsteadiness on feet: Secondary | ICD-10-CM | POA: Diagnosis not present

## 2018-05-20 DIAGNOSIS — R1312 Dysphagia, oropharyngeal phase: Secondary | ICD-10-CM | POA: Diagnosis not present

## 2018-05-20 DIAGNOSIS — N390 Urinary tract infection, site not specified: Secondary | ICD-10-CM | POA: Diagnosis not present

## 2018-05-20 DIAGNOSIS — Z9181 History of falling: Secondary | ICD-10-CM | POA: Diagnosis not present

## 2018-05-21 ENCOUNTER — Encounter: Payer: Self-pay | Admitting: Nurse Practitioner

## 2018-05-21 DIAGNOSIS — R1312 Dysphagia, oropharyngeal phase: Secondary | ICD-10-CM | POA: Diagnosis not present

## 2018-05-21 DIAGNOSIS — N390 Urinary tract infection, site not specified: Secondary | ICD-10-CM | POA: Diagnosis not present

## 2018-05-21 DIAGNOSIS — R2681 Unsteadiness on feet: Secondary | ICD-10-CM | POA: Diagnosis not present

## 2018-05-21 DIAGNOSIS — Z9181 History of falling: Secondary | ICD-10-CM | POA: Diagnosis not present

## 2018-05-21 DIAGNOSIS — M545 Low back pain: Secondary | ICD-10-CM | POA: Diagnosis not present

## 2018-05-21 DIAGNOSIS — M6281 Muscle weakness (generalized): Secondary | ICD-10-CM | POA: Diagnosis not present

## 2018-05-21 DIAGNOSIS — F418 Other specified anxiety disorders: Secondary | ICD-10-CM | POA: Insufficient documentation

## 2018-05-21 DIAGNOSIS — F339 Major depressive disorder, recurrent, unspecified: Secondary | ICD-10-CM | POA: Insufficient documentation

## 2018-05-22 ENCOUNTER — Non-Acute Institutional Stay (SKILLED_NURSING_FACILITY): Payer: Medicare Other | Admitting: Nurse Practitioner

## 2018-05-22 ENCOUNTER — Encounter: Payer: Self-pay | Admitting: Nurse Practitioner

## 2018-05-22 DIAGNOSIS — I482 Chronic atrial fibrillation, unspecified: Secondary | ICD-10-CM

## 2018-05-22 DIAGNOSIS — G309 Alzheimer's disease, unspecified: Secondary | ICD-10-CM | POA: Diagnosis not present

## 2018-05-22 DIAGNOSIS — N390 Urinary tract infection, site not specified: Secondary | ICD-10-CM | POA: Diagnosis not present

## 2018-05-22 DIAGNOSIS — R609 Edema, unspecified: Secondary | ICD-10-CM

## 2018-05-22 DIAGNOSIS — G250 Essential tremor: Secondary | ICD-10-CM

## 2018-05-22 DIAGNOSIS — F0150 Vascular dementia without behavioral disturbance: Secondary | ICD-10-CM | POA: Diagnosis not present

## 2018-05-22 DIAGNOSIS — M545 Low back pain: Secondary | ICD-10-CM | POA: Diagnosis not present

## 2018-05-22 DIAGNOSIS — Z9181 History of falling: Secondary | ICD-10-CM | POA: Diagnosis not present

## 2018-05-22 DIAGNOSIS — R1312 Dysphagia, oropharyngeal phase: Secondary | ICD-10-CM | POA: Diagnosis not present

## 2018-05-22 DIAGNOSIS — R2681 Unsteadiness on feet: Secondary | ICD-10-CM | POA: Diagnosis not present

## 2018-05-22 DIAGNOSIS — F418 Other specified anxiety disorders: Secondary | ICD-10-CM | POA: Diagnosis not present

## 2018-05-22 DIAGNOSIS — M6281 Muscle weakness (generalized): Secondary | ICD-10-CM | POA: Diagnosis not present

## 2018-05-22 DIAGNOSIS — F0280 Dementia in other diseases classified elsewhere without behavioral disturbance: Secondary | ICD-10-CM | POA: Diagnosis not present

## 2018-05-22 NOTE — Assessment & Plan Note (Signed)
Continue [facility] for safety and care assistance. Ambulates with walker.

## 2018-05-22 NOTE — Assessment & Plan Note (Signed)
05/19/18 reported the stated she don't want to be alone, difficulty falling asleep, refuses assistance/meds sometimes, anxious, pacing, saying I am afraid, can you sleep with me tonight. Will try Sertraline 25mg  po daily. Observe.

## 2018-05-22 NOTE — Assessment & Plan Note (Addendum)
Chronic, on 05/16/18 echocardiogram: EF 75%, mild pulmonic insufficiency, mild concentric left ventricular hypertrophy, hyperdynamic left ventricular function. Continue chlorthalidone 25mg  qd. Apply compression hosiery daily. Weight 2x/week, may consider increase diuretics. Observe.

## 2018-05-22 NOTE — Progress Notes (Signed)
Location:  [facility] Room Number: 31 Place of Service:  SNF (31) Provider:  Satomi Buda, ManXie  NP  Frederica Kuster, MD  Patient Care Team: Frederica Kuster, MD as PCP - General (Family Medicine) Nahser, Deloris Ping, MD as PCP - Cardiology (Cardiology)  Extended Emergency Contact Information Primary Emergency Contact: Morgan,Tom Address: [address]          [place], [place] [address] [place] of [place] Home Phone: [phone] Relation: Friend Secondary Emergency Contact: Brandy Hale [place] Home Phone: [phone] Relation: Niece  Code Status:  DNR Goals of care: Advanced Directive information Advanced Directives 05/13/2018  Does Patient Have a Medical Advance Directive? Yes  Type of Estate agent of [place];Out of facility DNR (pink MOST or yellow form)  Does patient want to make changes to medical advance directive? No - Patient declined  Copy of Healthcare Power of Attorney in Chart? Yes  Would patient like information on creating a medical advance directive? -  Pre-existing out of facility DNR order (yellow form or pink MOST form) Yellow form placed in chart (order not valid for inpatient use)     Chief Complaint  Patient presents with  . Medical Management of Chronic Issues    F/u- edema, alzheimers, dementia, CKD, unsteady gait    HPI:  Pt is a 84 y.o. female seen today for medical management of chronic diseases.     The patient has noted to have 05/19/18 reported the stated she don't want to be alone, difficulty falling asleep, refuses assistance/meds sometimes, anxious, pacing, saying I am afraid, can you sleep with me tonight. Hx of swelling legs, gradual weigh gain, she denied SOB, cough, sputum production, chest pain/pressures, palpitation, or O2 desaturation.  05/16/18 echocardiogram: EF 75%, mild pulmonic insufficiency, mild concentric left ventricular hypertrophy, hyperdynamic left  ventricular function. On Chlorthalidone 25mg  qd. Hx of Afib, heart rate is in control, taking Propranolol 80mg  qd for tremor, on Coumadin for thromboembolic risk reduction.      Past Medical History:  Diagnosis Date  . Anticoagulant long-term use   . Atrial fibrillation (HCC)   . CHF (congestive heart failure) (HCC)   . Chronic anticoagulation   . Hypertension   . Raynaud phenomenon   . Tremor, essential    Past Surgical History:  Procedure Laterality Date  . CHOLECYSTECTOMY    . TONSILLECTOMY      Allergies  Allergen Reactions  . Fosamax [Alendronate]   . Other Nausea And Vomiting    Green pepper    Outpatient Encounter Medications as of 05/22/2018  Medication Sig  . acetaminophen (TYLENOL) 500 MG tablet Take 500 mg by mouth every 8 (eight) hours as needed for mild pain or moderate pain. Take 1000 mg twice daily.  . Calcium Carbonate-Vit D-Min (CALTRATE 600+D PLUS MINERALS) 600-800 MG-UNIT TABS Take 1 tablet by mouth 2 (two) times daily.   . chlorthalidone (HYGROTON) 25 MG tablet Take 25 mg by mouth daily.   . feeding supplement (BOOST / RESOURCE BREEZE) LIQD Take 1 Container by mouth 2 (two) times daily between meals.  . hydroxypropyl methylcellulose / hypromellose (ISOPTO TEARS / GONIOVISC) 2.5 % ophthalmic solution Place 1 drop into both eyes 3 (three) times daily.  . potassium chloride (K-DUR) 10 MEQ tablet Take 20 mEq by mouth daily.  . propranolol ER (INDERAL LA) 80 MG 24 hr capsule TAKE 1 CAPSULE BY MOUTH  DAILY  . warfarin (COUMADIN) 2 MG tablet Take 2  mg by mouth 2 (two) times daily.  . [DISCONTINUED] acetaminophen (TYLENOL) 325 MG tablet Take 650 mg by mouth 2 (two) times daily.    No facility-administered encounter medications on file as of 05/22/2018.    ROS was provided with assistance of staff Review of Systems  Constitutional: Positive for unexpected weight change. Negative for activity change, appetite change, chills, diaphoresis, fatigue and fever.    HENT: Positive for hearing loss. Negative for congestion and voice change.   Respiratory: Negative for cough, shortness of breath and wheezing.   Cardiovascular: Positive for leg swelling. Negative for chest pain and palpitations.  Gastrointestinal: Negative for abdominal distention, abdominal pain, constipation, diarrhea, nausea and vomiting.  Genitourinary: Negative for difficulty urinating, dysuria and urgency.  Musculoskeletal: Positive for arthralgias, back pain and gait problem.  Skin: Negative for color change and pallor.  Neurological: Negative for dizziness, facial asymmetry, speech difficulty and weakness.       Memory lapses.   Psychiatric/Behavioral: Positive for confusion and sleep disturbance. Negative for agitation, behavioral problems and hallucinations. The patient is nervous/anxious.     Immunization History  Administered Date(s) Administered  . Influenza-Unspecified 05/28/2017  . Pneumococcal Conjugate-13 07/13/2017  . Tdap 07/13/2017   Pertinent  Health Maintenance Due  Topic Date Due  . INFLUENZA VACCINE  03/07/2018  . PNA vac Low Risk Adult (2 of 2 - PPSV23) 07/13/2018  . DEXA SCAN  Completed   Fall Risk  10/05/2017  Falls in the past year? No   Functional Status Survey:    Vitals:   05/22/18 0946  BP: 140/78  Pulse: 70  Resp: 20  Temp: 98 F (36.7 C)  SpO2: 95%  Weight: 135 lb 11.2 oz (61.6 kg)  Height: 5\' 4"  (1.626 m)   Body mass index is 23.29 kg/m. Physical Exam  Constitutional: She appears well-developed and well-nourished.  HENT:  Head: Normocephalic and atraumatic.  Eyes: Pupils are equal, round, and reactive to light. EOM are normal.  Neck: Normal range of motion. Neck supple. No JVD present. No thyromegaly present.  Cardiovascular: Normal rate.  Murmur heard. Irregular heart beats.   Pulmonary/Chest: Effort normal. She has no wheezes. She has no rales.  Abdominal: Soft. She exhibits no distension. There is no tenderness. There is no  rebound and no guarding.  Musculoskeletal: She exhibits edema.  1+edema BLE. Self transfers. Ambulates with walker.   Neurological: She is alert. No cranial nerve deficit. She exhibits normal muscle tone. Coordination abnormal.  Oriented to person and place. Fine resting tremors in fingers.   Skin: Skin is warm and dry.  Psychiatric: She has a normal mood and affect. Her behavior is normal.    Labs reviewed: Recent Labs    12/11/17 0807  01/29/18 04/17/18 04/20/18 1337 04/25/18  NA 139   < > 141 143 144 135*  K 3.2*   < > 3.7 4.0 3.3* 3.4  CL 95*   < > 100 100 97* 93  CO2 35*   < > 33 33 35* 35  GLUCOSE 119*  --   --   --  121*  --   BUN 30*   < > 33* 39* 33* 29*  CREATININE 1.23*   < > 1.2* 1.4* 1.24* 1.2*  CALCIUM 10.0   < > 9.7 10.1 10.6* 8.9  MG  --   --  1.7  --   --   --    < > = values in this interval not displayed.   Recent Labs  04/17/18 04/20/18 1337 04/25/18  AST 17 24 14   ALT 9 16 8   ALKPHOS 37 47 40  BILITOT  --  0.7  --   PROT 5.6 7.9 5.5  ALBUMIN 3.3 4.1 3.2   Recent Labs    12/05/17  12/11/17 0625  04/20/18 1337 04/25/18 05/01/18  WBC 8.8   < > 10.1   < > 12.8* 11.5 8.9  NEUTROABS 4,928  --  7.4  --  10.7*  --   --   HGB 12.4  --  14.2   < > 15.5* 11.9* 12.9  HCT 36  --  42.0   < > 46.7* 34* 38  MCV  --   --  90.3  --  91.2  --   --   PLT 188  --  247   < > 269 221 275   < > = values in this interval not displayed.   Lab Results  Component Value Date   TSH 0.90 07/12/2017   No results found for: HGBA1C No results found for: CHOL, HDL, LDLCALC, LDLDIRECT, TRIG, CHOLHDL  Significant Diagnostic Results in last 30 days:  Xr Thoracic Spine 2 View  Result Date: 05/02/2018 X-rays show fractures T12 and L1.  Minimal healing.   Assessment/Plan Depression with anxiety 05/19/18 reported the stated she don't want to be alone, difficulty falling asleep, refuses assistance/meds sometimes, anxious, pacing, saying I am afraid, can you sleep with me  tonight. Will try Sertraline 25mg  po daily. Observe.    Edema Chronic, on 05/16/18 echocardiogram: EF 75%, mild pulmonic insufficiency, mild concentric left ventricular hypertrophy, hyperdynamic left ventricular function. Continue chlorthalidone 25mg  qd. Apply compression hosiery daily. Weight 2x/week, may consider increase diuretics. Observe.    Mixed Alzheimer's and vascular dementia Continue [facility] for safety and care assistance. Ambulates with walker.   Tremor, essential Fine resting tremor in fingers, not disabling, continue Propranolol 80mg  qd.   Chronic a-fib (HCC) Heart rate is in control, continue Coumadin for thromboembolic risk reduction, INR goal is 2-3.      Family/ staff Communication: plan of care reviewed with the patient and charge nurse.   Labs/tests ordered: none  Time spend 25 minutes.

## 2018-05-22 NOTE — Assessment & Plan Note (Signed)
Fine resting tremor in fingers, not disabling, continue Propranolol 80mg  qd.

## 2018-05-22 NOTE — Assessment & Plan Note (Signed)
Heart rate is in control, continue Coumadin for thromboembolic risk reduction, INR goal is 2-3.  

## 2018-05-23 DIAGNOSIS — M6281 Muscle weakness (generalized): Secondary | ICD-10-CM | POA: Diagnosis not present

## 2018-05-23 DIAGNOSIS — N390 Urinary tract infection, site not specified: Secondary | ICD-10-CM | POA: Diagnosis not present

## 2018-05-23 DIAGNOSIS — R2681 Unsteadiness on feet: Secondary | ICD-10-CM | POA: Diagnosis not present

## 2018-05-23 DIAGNOSIS — Z9181 History of falling: Secondary | ICD-10-CM | POA: Diagnosis not present

## 2018-05-23 DIAGNOSIS — R1312 Dysphagia, oropharyngeal phase: Secondary | ICD-10-CM | POA: Diagnosis not present

## 2018-05-23 DIAGNOSIS — M545 Low back pain: Secondary | ICD-10-CM | POA: Diagnosis not present

## 2018-05-24 DIAGNOSIS — R1312 Dysphagia, oropharyngeal phase: Secondary | ICD-10-CM | POA: Diagnosis not present

## 2018-05-24 DIAGNOSIS — Z9181 History of falling: Secondary | ICD-10-CM | POA: Diagnosis not present

## 2018-05-24 DIAGNOSIS — M545 Low back pain: Secondary | ICD-10-CM | POA: Diagnosis not present

## 2018-05-24 DIAGNOSIS — M6281 Muscle weakness (generalized): Secondary | ICD-10-CM | POA: Diagnosis not present

## 2018-05-24 DIAGNOSIS — N390 Urinary tract infection, site not specified: Secondary | ICD-10-CM | POA: Diagnosis not present

## 2018-05-24 DIAGNOSIS — R2681 Unsteadiness on feet: Secondary | ICD-10-CM | POA: Diagnosis not present

## 2018-05-27 DIAGNOSIS — Z9181 History of falling: Secondary | ICD-10-CM | POA: Diagnosis not present

## 2018-05-27 DIAGNOSIS — M545 Low back pain: Secondary | ICD-10-CM | POA: Diagnosis not present

## 2018-05-27 DIAGNOSIS — N390 Urinary tract infection, site not specified: Secondary | ICD-10-CM | POA: Diagnosis not present

## 2018-05-27 DIAGNOSIS — R1312 Dysphagia, oropharyngeal phase: Secondary | ICD-10-CM | POA: Diagnosis not present

## 2018-05-27 DIAGNOSIS — M6281 Muscle weakness (generalized): Secondary | ICD-10-CM | POA: Diagnosis not present

## 2018-05-27 DIAGNOSIS — R2681 Unsteadiness on feet: Secondary | ICD-10-CM | POA: Diagnosis not present

## 2018-05-28 DIAGNOSIS — I4891 Unspecified atrial fibrillation: Secondary | ICD-10-CM | POA: Diagnosis not present

## 2018-05-29 DIAGNOSIS — Z9181 History of falling: Secondary | ICD-10-CM | POA: Diagnosis not present

## 2018-05-29 DIAGNOSIS — M6281 Muscle weakness (generalized): Secondary | ICD-10-CM | POA: Diagnosis not present

## 2018-05-29 DIAGNOSIS — R1312 Dysphagia, oropharyngeal phase: Secondary | ICD-10-CM | POA: Diagnosis not present

## 2018-05-29 DIAGNOSIS — M545 Low back pain: Secondary | ICD-10-CM | POA: Diagnosis not present

## 2018-05-29 DIAGNOSIS — R2681 Unsteadiness on feet: Secondary | ICD-10-CM | POA: Diagnosis not present

## 2018-05-29 DIAGNOSIS — N390 Urinary tract infection, site not specified: Secondary | ICD-10-CM | POA: Diagnosis not present

## 2018-05-30 DIAGNOSIS — R1312 Dysphagia, oropharyngeal phase: Secondary | ICD-10-CM | POA: Diagnosis not present

## 2018-05-30 DIAGNOSIS — I4891 Unspecified atrial fibrillation: Secondary | ICD-10-CM | POA: Diagnosis not present

## 2018-05-30 DIAGNOSIS — M6281 Muscle weakness (generalized): Secondary | ICD-10-CM | POA: Diagnosis not present

## 2018-05-30 DIAGNOSIS — Z9181 History of falling: Secondary | ICD-10-CM | POA: Diagnosis not present

## 2018-05-30 DIAGNOSIS — R2681 Unsteadiness on feet: Secondary | ICD-10-CM | POA: Diagnosis not present

## 2018-05-30 DIAGNOSIS — M545 Low back pain: Secondary | ICD-10-CM | POA: Diagnosis not present

## 2018-05-30 DIAGNOSIS — N390 Urinary tract infection, site not specified: Secondary | ICD-10-CM | POA: Diagnosis not present

## 2018-05-31 DIAGNOSIS — Z9181 History of falling: Secondary | ICD-10-CM | POA: Diagnosis not present

## 2018-05-31 DIAGNOSIS — N390 Urinary tract infection, site not specified: Secondary | ICD-10-CM | POA: Diagnosis not present

## 2018-05-31 DIAGNOSIS — R1312 Dysphagia, oropharyngeal phase: Secondary | ICD-10-CM | POA: Diagnosis not present

## 2018-05-31 DIAGNOSIS — M545 Low back pain: Secondary | ICD-10-CM | POA: Diagnosis not present

## 2018-05-31 DIAGNOSIS — M6281 Muscle weakness (generalized): Secondary | ICD-10-CM | POA: Diagnosis not present

## 2018-05-31 DIAGNOSIS — R2681 Unsteadiness on feet: Secondary | ICD-10-CM | POA: Diagnosis not present

## 2018-06-06 DIAGNOSIS — M6281 Muscle weakness (generalized): Secondary | ICD-10-CM | POA: Diagnosis not present

## 2018-06-06 DIAGNOSIS — R2681 Unsteadiness on feet: Secondary | ICD-10-CM | POA: Diagnosis not present

## 2018-06-06 DIAGNOSIS — N390 Urinary tract infection, site not specified: Secondary | ICD-10-CM | POA: Diagnosis not present

## 2018-06-06 DIAGNOSIS — R1312 Dysphagia, oropharyngeal phase: Secondary | ICD-10-CM | POA: Diagnosis not present

## 2018-06-06 DIAGNOSIS — M545 Low back pain: Secondary | ICD-10-CM | POA: Diagnosis not present

## 2018-06-06 DIAGNOSIS — Z9181 History of falling: Secondary | ICD-10-CM | POA: Diagnosis not present

## 2018-06-13 ENCOUNTER — Ambulatory Visit (INDEPENDENT_AMBULATORY_CARE_PROVIDER_SITE_OTHER): Payer: Medicare Other

## 2018-06-13 ENCOUNTER — Ambulatory Visit (INDEPENDENT_AMBULATORY_CARE_PROVIDER_SITE_OTHER): Payer: Medicare Other | Admitting: Orthopaedic Surgery

## 2018-06-13 DIAGNOSIS — M549 Dorsalgia, unspecified: Secondary | ICD-10-CM

## 2018-06-13 NOTE — Progress Notes (Signed)
Office Visit Note   Patient: Aneka Fagerstrom           Date of Birth: [birth year]           MRN: [id number] Visit Date: 06/13/2018              Requested by: Oneal Grout, MD No address on file PCP: Frederica Kuster, MD   Assessment & Plan: Visit Diagnoses:  1. Mid back pain     Plan: At this point, the patient is no longer symptomatic.  I will have her continue with physical therapy and this was written on her nursing discharge summary today.  She will follow-up with [place] as needed.  Follow-Up Instructions: Return if symptoms worsen or fail to improve.   Orders:  Orders Placed This Encounter  Procedures  . XR Thoracic Spine 2 View   No orders of the defined types were placed in this encounter.     Procedures: No procedures performed   Clinical Data: No additional findings.   Subjective: Chief Complaint  Patient presents with  . Middle Back - Follow-up    HPI patient is a pleasant 84 year old demented female who presents to our clinic today for follow-up of her mid back.  She sustained an anterior wedge fracture at T12 with a compression fracture at L1 which occurred from a fall on 04/24/2018.  [facility] and has previously been getting physical therapy.  I am not sure if she has been recently getting this as it is not in her nursing home notes and she is here with no and other than the driver.  She does appear comfortable and without any discomfort.  Review of Systems as detailed in HPI.  All others reviewed and are negative.   Objective: Vital Signs: There were no vitals taken for this visit.  Physical Exam well-developed well-nourished female in no acute distress.  She is alert but not oriented.  Ortho Exam examination of her thoracic and lumbar spines reveals no tenderness to palpation.  Specialty Comments:  No specialty comments available.  Imaging: Xr Thoracic Spine 2 View  Result Date: 06/13/2018 X-rays demonstrate anterior  wedge fracture T12 compression fracture to L1 which are both stable    PMFS History: Patient Active Problem List   Diagnosis Date Noted  . Depression with anxiety 05/21/2018  . Edema 05/13/2018  . Wedge compression fracture of T12 vertebra (HCC) 04/26/2018  . Mid back pain 04/24/2018  . Leukocytosis 04/22/2018  . Ecchymosis 04/22/2018  . Urinary urgency 04/16/2018  . Urinary frequency 04/16/2018  . UTI (urinary tract infection) 12/12/2017  . Fall 12/12/2017  . Urinary incontinence 12/04/2017  . Dry eye syndrome of both eyes 10/22/2017  . Mixed Alzheimer's and vascular dementia (HCC) 10/16/2017  . CKD (chronic kidney disease) stage 3, GFR 30-59 ml/min (HCC) 10/09/2017  . Neuropathy 10/09/2017  . Hypokalemia 10/09/2017  . Anemia, chronic disease 07/11/2017  . History of fracture of clavicle 05/16/2017  . Encounter for therapeutic drug monitoring 04/19/2017  . Chronic a-fib   . Tremor, essential   . Chronic anticoagulation   . Hypertension   . Raynaud phenomenon    Past Medical History:  Diagnosis Date  . Anticoagulant long-term use   . Atrial fibrillation (HCC)   . CHF (congestive heart failure) (HCC)   . Chronic anticoagulation   . Hypertension   . Raynaud phenomenon   . Tremor, essential     Family History  Problem Relation Age of Onset  .  Hypertension Father   . Heart attack Father   . Heart disease Unknown   . Hypertension Unknown   . Stroke Unknown   . Cancer Unknown     Past Surgical History:  Procedure Laterality Date  . CHOLECYSTECTOMY    . TONSILLECTOMY     Social History   Occupational History  . Occupation: Retired- Automotive engineer  Tobacco Use  . Smoking status: Never Smoker  . Smokeless tobacco: Never Used  Substance and Sexual Activity  . Alcohol use: No  . Drug use: No  . Sexual activity: Not Currently

## 2018-06-17 ENCOUNTER — Non-Acute Institutional Stay (SKILLED_NURSING_FACILITY): Payer: Medicare Other | Admitting: Family Medicine

## 2018-06-17 ENCOUNTER — Encounter: Payer: Self-pay | Admitting: Family Medicine

## 2018-06-17 DIAGNOSIS — Z7901 Long term (current) use of anticoagulants: Secondary | ICD-10-CM | POA: Diagnosis not present

## 2018-06-17 DIAGNOSIS — I482 Chronic atrial fibrillation, unspecified: Secondary | ICD-10-CM

## 2018-06-17 DIAGNOSIS — S22080A Wedge compression fracture of T11-T12 vertebra, initial encounter for closed fracture: Secondary | ICD-10-CM | POA: Diagnosis not present

## 2018-06-17 DIAGNOSIS — D638 Anemia in other chronic diseases classified elsewhere: Secondary | ICD-10-CM

## 2018-06-17 NOTE — Progress Notes (Signed)
Provider:  Jacalyn Lefevre, MD Location:  [facility] Room Number: 31 Place of Service:  SNF ([phone])  PCP: Frederica Kuster, MD Patient Care Team: Frederica Kuster, MD as PCP - General (Family Medicine) Nahser, Deloris Ping, MD as PCP - Cardiology (Cardiology)  Extended Emergency Contact Information Primary Emergency Contact: Morgan,Tom Address: [address]          [place], [place] [address] [place] of [place] Home Phone: [phone] Relation: Friend Secondary Emergency Contact: Brandy Hale [place] of [place] Home Phone: [phone] Relation: Niece  Code Status: DNR Goals of Care: Advanced Directive information Advanced Directives 06/17/2018  Does Patient Have a Medical Advance Directive? Yes  Type of Estate agent of [place];Out of facility DNR (pink MOST or yellow form)  Does patient want to make changes to medical advance directive? No - Patient declined  Copy of Healthcare Power of Attorney in Chart? Yes - validated most recent copy scanned in chart (See row information)  Would patient like information on creating a medical advance directive? -  Pre-existing out of facility DNR order (yellow form or pink MOST form) Yellow form placed in chart (order not valid for inpatient use)      Chief Complaint  Patient presents with  . Medical Management of Chronic Issues    F/u- depression, alzheimers, afib, edema, tremor,     HPI: Patient is a 84 y.o. female seen today for medical management of chronic problems including: Alzheimer's dementia, atrial fibrillation, edema, and tremor.  She continues to take propranolol for the tremor.  There are no side effects related.  She was seen recently for edema and was started on chlorthalidone and potassium supplement. She also has chronic atrial fibrillation and takes Coumadin for stroke prevention.  She has had some falls recently, but no significant injury.  Past Medical  History:  Diagnosis Date  . Anticoagulant long-term use   . Atrial fibrillation (HCC)   . CHF (congestive heart failure) (HCC)   . Chronic anticoagulation   . Hypertension   . Raynaud phenomenon   . Tremor, essential    Past Surgical History:  Procedure Laterality Date  . CHOLECYSTECTOMY    . TONSILLECTOMY      reports that she has never smoked. She has never used smokeless tobacco. She reports that she does not drink alcohol or use drugs. Social History   Socioeconomic History  . Marital status: Widowed    Spouse name: Not on file  . Number of children: 0  . Years of education: 65  . Highest education level: Not on file  Occupational History  . Occupation: Retired- Curator education  Social Needs  . Financial resource strain: Not hard at all  . Food insecurity:    Worry: Never true    Inability: Never true  . Transportation needs:    Medical: No    Non-medical: No  Tobacco Use  . Smoking status: Never Smoker  . Smokeless tobacco: Never Used  Substance and Sexual Activity  . Alcohol use: No  . Drug use: No  . Sexual activity: Not Currently  Lifestyle  . Physical activity:    Days per week: 0 days    Minutes per session: 0 min  . Stress: Not at all  Relationships  . Social connections:    Talks on phone: More than three times a week    Gets together: More than three times a week    Attends religious service: Never  Active member of club or organization: No    Attends meetings of clubs or organizations: Never    Relationship status: Widowed  . Intimate partner violence:    Fear of current or ex partner: No    Emotionally abused: No    Physically abused: No    Forced sexual activity: No  Other Topics Concern  . Not on file  Social History Narrative   Tobacco use, amount per day now: No      Past tobacco use, amount per day:      How many years did you use tobacco:      Alcohol use (drinks per week): occasional wine      Diet:      Do you  drink/eat things with caffeine? Decaf coffee.      Marital status: Widowed            What year were you married?      Do you live in a house, apartment, assisted living, condo, trailer? Assisted living      Is it one or more stories? 1      How many persons live in your home? 1      Do you have any pets in your home? 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       Do you exercise? No            How often?      Do you have a living will?       Do you have a DNR form? No           If not, do you want to discuss one? Yes      Do you have signed POA/HPOA forms? Yes                 Functional Status Survey:    Family History  Problem Relation Age of Onset  . Hypertension Father   . Heart attack Father   . Heart disease Unknown   . Hypertension Unknown   . Stroke Unknown   . Cancer Unknown     Health Maintenance  Topic Date Due  . PNA vac Low Risk Adult (2 of 2 - PPSV23) 07/13/2018  . TETANUS/TDAP  07/14/2027  . INFLUENZA VACCINE  Completed  . DEXA SCAN  Completed    Allergies  Allergen Reactions  . Fosamax [Alendronate]   . Other Nausea And Vomiting    Green pepper    Outpatient Encounter Medications as of 06/17/2018  Medication Sig  . acetaminophen (TYLENOL) 500 MG tablet Take 500 mg by mouth every 8 (eight) hours as needed for mild pain or moderate pain. Take 1000 mg twice daily.  Marland Kitchen acetaminophen (TYLENOL) 500 MG tablet Take 1,000 mg by mouth 2 (two) times daily.  . Calcium Carbonate-Vit D-Min (CALTRATE 600+D PLUS MINERALS) 600-800 MG-UNIT TABS Take 1 tablet by mouth 2 (two) times daily.   . chlorthalidone (HYGROTON) 25 MG tablet Take 25 mg by mouth daily.   . hydroxypropyl methylcellulose / hypromellose (ISOPTO TEARS / GONIOVISC) 2.5 % ophthalmic solution Place 1 drop into both eyes 3 (three) times daily.  . potassium chloride (K-DUR) 10 MEQ tablet Take 20 mEq by mouth daily.  . propranolol ER (INDERAL LA) 80 MG 24 hr capsule TAKE 1 CAPSULE BY  MOUTH  DAILY  . warfarin (COUMADIN) 2 MG tablet Take 2 mg by mouth 2 (two) times daily.  . [DISCONTINUED] feeding supplement (BOOST /  RESOURCE BREEZE) LIQD Take 1 Container by mouth 2 (two) times daily between meals.   No facility-administered encounter medications on file as of 06/17/2018.     Review of Systems  Constitutional: Negative.   HENT: Negative.   Respiratory: Negative.   Cardiovascular: Negative.   Musculoskeletal: Positive for back pain and gait problem.  Neurological: Positive for tremors.  Psychiatric/Behavioral: Positive for confusion.    Vitals:   06/17/18 1221  BP: (!) 152/78  Pulse: 76  Resp: 20  Temp: (!) 97.4 F (36.3 C)  SpO2: 93%  Weight: 129 lb (58.5 kg)  Height: 5\' 4"  (1.626 m)   Body mass index is 22.14 kg/m. Physical Exam  HENT:  Head: Normocephalic.  Mouth/Throat: Oropharynx is clear and moist.  Cardiovascular: Normal rate.  Pulmonary/Chest: Effort normal and breath sounds normal.  Musculoskeletal: Normal range of motion. She exhibits no edema.  Ambulates with rolling walker  Neurological: She is alert.  Oriented to person and place  Nursing note and vitals reviewed.   Labs reviewed: Basic Metabolic Panel: Recent Labs    12/11/17 0807  01/29/18 04/17/18 04/20/18 1337 04/25/18  NA 139   < > 141 143 144 135*  K 3.2*   < > 3.7 4.0 3.3* 3.4  CL 95*   < > 100 100 97* 93  CO2 35*   < > 33 33 35* 35  GLUCOSE 119*  --   --   --  121*  --   BUN 30*   < > 33* 39* 33* 29*  CREATININE 1.23*   < > 1.2* 1.4* 1.24* 1.2*  CALCIUM 10.0   < > 9.7 10.1 10.6* 8.9  MG  --   --  1.7  --   --   --    < > = values in this interval not displayed.   Liver Function Tests: Recent Labs    04/17/18 04/20/18 1337 04/25/18  AST 17 24 14   ALT 9 16 8   ALKPHOS 37 47 40  BILITOT  --  0.7  --   PROT 5.6 7.9 5.5  ALBUMIN 3.3 4.1 3.2   No results for input(s): LIPASE, AMYLASE in the last 8760 hours. No results for input(s): AMMONIA in the last 8760  hours. CBC: Recent Labs    12/05/17  12/11/17 0625  04/20/18 1337 04/25/18 05/01/18  WBC 8.8   < > 10.1   < > 12.8* 11.5 8.9  NEUTROABS 4,928  --  7.4  --  10.7*  --   --   HGB 12.4  --  14.2   < > 15.5* 11.9* 12.9  HCT 36  --  42.0   < > 46.7* 34* 38  MCV  --   --  90.3  --  91.2  --   --   PLT 188  --  247   < > 269 221 275   < > = values in this interval not displayed.   Cardiac Enzymes: No results for input(s): CKTOTAL, CKMB, CKMBINDEX, TROPONINI in the last 8760 hours. BNP: Invalid input(s): POCBNP No results found for: HGBA1C Lab Results  Component Value Date   TSH 0.90 07/12/2017   No results found for: VITAMINB12 No results found for: FOLATE No results found for: IRON, TIBC, FERRITIN  Imaging and Procedures obtained prior to SNF admission: Ct Head Wo Contrast  Result Date: 04/20/2018 CLINICAL DATA:  Mental status changes.  Known UTI. EXAM: CT HEAD WITHOUT CONTRAST TECHNIQUE: Contiguous axial images were obtained from  the base of the skull through the vertex without intravenous contrast. COMPARISON:  12/11/2017 FINDINGS: Brain: Stable age related cerebral atrophy, ventriculomegaly and periventricular white matter disease. No extra-axial fluid collections are identified. No CT findings for acute hemispheric infarction or intracranial hemorrhage. No mass lesions. The brainstem and cerebellum are normal. Vascular: No hyperdense vessel or unexpected calcification. Skull: No acute skull fracture or worrisome bone lesion. Stable prominent arachnoid granulations involving the occipital bone. Sinuses/Orbits: The paranasal sinuses and mastoid air cells are grossly clear. Other: No scalp hematoma obvious laceration. IMPRESSION: 1. Age related and stable appearing cerebral atrophy, ventriculomegaly and extensive periventricular white matter disease. 2. No acute intracranial findings or extra-axial fluid collections. 3. No bone lesions or fracture. Electronically Signed   By: Rudie Meyer  M.D.   On: 04/20/2018 14:43    Assessment/Plan 1. Chronic a-fib Rate controlled on propranolol and takes Coumadin for stroke prevention  2. Closed wedge compression fracture of twelfth thoracic vertebra, initial encounter [facility]) Patient takes tramadol for pain which is effective  3. Chronic anticoagulation Pro times have been therapeutic.  I do have some concerns about continued Coumadin use in view of her falls and age    2. Anemia, chronic disease Note that her hemoglobin has declined over the past several months.  She has diagnosis of anemia of chronic disease but I would like to check some Hemoccults and initiate iron treatment after Hemoccults have been obtained Family/ staff Communication:   Labs/tests ordered: Hemoccults  Bertram Millard. Hyacinth Meeker, MD [facility] [address] [place], [place] [address] Office [phone]

## 2018-06-20 DIAGNOSIS — I4891 Unspecified atrial fibrillation: Secondary | ICD-10-CM | POA: Diagnosis not present

## 2018-06-20 LAB — PROTIME-INR: Protime: 14.2 — AB (ref 10.0–13.8)

## 2018-06-20 LAB — POCT INR: INR: 1.4 — AB (ref <=1.1)

## 2018-06-21 ENCOUNTER — Other Ambulatory Visit: Payer: Self-pay | Admitting: *Deleted

## 2018-06-26 DIAGNOSIS — I4891 Unspecified atrial fibrillation: Secondary | ICD-10-CM | POA: Diagnosis not present

## 2018-07-05 DIAGNOSIS — I4891 Unspecified atrial fibrillation: Secondary | ICD-10-CM | POA: Diagnosis not present

## 2018-07-09 DIAGNOSIS — I4891 Unspecified atrial fibrillation: Secondary | ICD-10-CM | POA: Diagnosis not present

## 2018-07-09 LAB — POCT INR: INR: 2.2 — AB (ref <=1.1)

## 2018-07-09 LAB — PROTIME-INR: PROTIME: 22 — AB (ref 10.0–13.8)

## 2018-07-10 ENCOUNTER — Other Ambulatory Visit: Payer: Self-pay | Admitting: *Deleted

## 2018-07-16 DIAGNOSIS — B351 Tinea unguium: Secondary | ICD-10-CM | POA: Diagnosis not present

## 2018-07-16 DIAGNOSIS — M79671 Pain in right foot: Secondary | ICD-10-CM | POA: Diagnosis not present

## 2018-07-16 DIAGNOSIS — I4891 Unspecified atrial fibrillation: Secondary | ICD-10-CM | POA: Diagnosis not present

## 2018-07-16 DIAGNOSIS — Q6689 Other  specified congenital deformities of feet: Secondary | ICD-10-CM | POA: Diagnosis not present

## 2018-07-16 DIAGNOSIS — M79672 Pain in left foot: Secondary | ICD-10-CM | POA: Diagnosis not present

## 2018-07-17 ENCOUNTER — Encounter: Payer: Self-pay | Admitting: Nurse Practitioner

## 2018-07-17 ENCOUNTER — Non-Acute Institutional Stay (SKILLED_NURSING_FACILITY): Payer: Medicare Other | Admitting: Nurse Practitioner

## 2018-07-17 DIAGNOSIS — I482 Chronic atrial fibrillation, unspecified: Secondary | ICD-10-CM

## 2018-07-17 DIAGNOSIS — F418 Other specified anxiety disorders: Secondary | ICD-10-CM | POA: Diagnosis not present

## 2018-07-17 DIAGNOSIS — F0150 Vascular dementia without behavioral disturbance: Secondary | ICD-10-CM

## 2018-07-17 DIAGNOSIS — R609 Edema, unspecified: Secondary | ICD-10-CM | POA: Diagnosis not present

## 2018-07-17 DIAGNOSIS — G250 Essential tremor: Secondary | ICD-10-CM | POA: Diagnosis not present

## 2018-07-17 DIAGNOSIS — R634 Abnormal weight loss: Secondary | ICD-10-CM | POA: Insufficient documentation

## 2018-07-17 DIAGNOSIS — G309 Alzheimer's disease, unspecified: Secondary | ICD-10-CM

## 2018-07-17 DIAGNOSIS — F0280 Dementia in other diseases classified elsewhere without behavioral disturbance: Secondary | ICD-10-CM | POA: Diagnosis not present

## 2018-07-17 DIAGNOSIS — S22080A Wedge compression fracture of T11-T12 vertebra, initial encounter for closed fracture: Secondary | ICD-10-CM

## 2018-07-17 DIAGNOSIS — Z7901 Long term (current) use of anticoagulants: Secondary | ICD-10-CM | POA: Diagnosis not present

## 2018-07-17 NOTE — Assessment & Plan Note (Signed)
In fingers, not disabling, continue Propranolol 80mg  qd.

## 2018-07-17 NOTE — Assessment & Plan Note (Signed)
Heart rate is in control.  

## 2018-07-17 NOTE — Progress Notes (Signed)
Location:  [facility] Room Number: 31 Place of Service:  SNF (31) Provider:  Aparna Vanderweele, ManXie  NP  Frederica Kuster, MD  Patient Care Team: Frederica Kuster, MD as PCP - General (Family Medicine) Nahser, Deloris Ping, MD as PCP - Cardiology (Cardiology) Kaidence Sant X, NP as Nurse Practitioner (Internal Medicine)  Extended Emergency Contact Information Primary Emergency Contact: Morgan,Tom Address: [address]          [place], [place] [address] [place] of [place] Home Phone: [phone] Relation: Friend Secondary Emergency Contact: Brandy Hale [place] of [place] Home Phone: [phone] Relation: Niece  Code Status:  DNR Goals of care: Advanced Directive information Advanced Directives 07/17/2018  Does Patient Have a Medical Advance Directive? Yes  Type of Estate agent of [place];Out of facility DNR (pink MOST or yellow form)  Does patient want to make changes to medical advance directive? No - Patient declined  Copy of Healthcare Power of Attorney in Chart? Yes - validated most recent copy scanned in chart (See row information)  Would patient like information on creating a medical advance directive? -  Pre-existing out of facility DNR order (yellow form or pink MOST form) Yellow form placed in chart (order not valid for inpatient use)     Chief Complaint  Patient presents with  . Medical Management of Chronic Issues    F/u-Alzheimers, dementia, afib, anemia, depression,    HPI:  Pt is a 84 y.o. female seen today for medical management of chronic diseases.     The patient resides in SNF [facility] for safety and care assistance. Her mood is stable on Serttrlaine 25mg  qd. HTN is controlled. Finer tremor in fingers is not disabling, on Propranolol 80mg  qd. Chronic edema BLE, stable, on Chlorthalidone 25mg  qd. Lower back pain is managed on Tylenol 1000mg  bid. Afib, heart rate is in control, on Coumadin  Past Medical  History:  Diagnosis Date  . Anticoagulant long-term use   . Atrial fibrillation (HCC)   . CHF (congestive heart failure) (HCC)   . Chronic anticoagulation   . Hypertension   . Raynaud phenomenon   . Tremor, essential    Past Surgical History:  Procedure Laterality Date  . CHOLECYSTECTOMY    . TONSILLECTOMY      Allergies  Allergen Reactions  . Fosamax [Alendronate]   . Other Nausea And Vomiting    Green pepper    Outpatient Encounter Medications as of 07/17/2018  Medication Sig  . acetaminophen (TYLENOL) 500 MG tablet Take 500 mg by mouth every 8 (eight) hours as needed for mild pain or moderate pain. Take 1000 mg twice daily.  Marland Kitchen acetaminophen (TYLENOL) 500 MG tablet Take 1,000 mg by mouth 2 (two) times daily.  . Calcium Carbonate-Vit D-Min (CALTRATE 600+D PLUS MINERALS) 600-800 MG-UNIT TABS Take 1 tablet by mouth 2 (two) times daily.   . chlorthalidone (HYGROTON) 25 MG tablet Take 25 mg by mouth daily.   . hydroxypropyl methylcellulose / hypromellose (ISOPTO TEARS / GONIOVISC) 2.5 % ophthalmic solution Place 1 drop into both eyes 3 (three) times daily.  . potassium chloride (K-DUR) 10 MEQ tablet Take 20 mEq by mouth daily.  . propranolol ER (INDERAL LA) 80 MG 24 hr capsule TAKE 1 CAPSULE BY MOUTH  DAILY  . sertraline (ZOLOFT) 25 MG tablet Take 25 mg by mouth daily.  Marland Kitchen warfarin (COUMADIN) 3 MG tablet Take 3 mg by mouth daily.  . [DISCONTINUED] warfarin (COUMADIN) 2 MG tablet Take  2 mg by mouth 2 (two) times daily.   No facility-administered encounter medications on file as of 07/17/2018.    ROS was provided with assistance of staff Review of Systems  Constitutional: Positive for unexpected weight change. Negative for activity change, appetite change, chills, diaphoresis, fatigue and fever.       #6Ibs in the past month  HENT: Positive for hearing loss. Negative for congestion and voice change.   Respiratory: Negative for cough, shortness of breath and wheezing.     Cardiovascular: Positive for leg swelling. Negative for chest pain and palpitations.  Gastrointestinal: Negative for abdominal distention, abdominal pain, constipation, diarrhea, nausea and vomiting.  Genitourinary: Negative for difficulty urinating, dysuria and urgency.  Musculoskeletal: Positive for arthralgias, back pain and gait problem.  Skin: Negative for color change and pallor.  Neurological: Positive for tremors. Negative for dizziness, speech difficulty and headaches.       Memory lapses.   Psychiatric/Behavioral: Positive for confusion. Negative for agitation, behavioral problems, hallucinations and sleep disturbance. The patient is not nervous/anxious.     Immunization History  Administered Date(s) Administered  . Influenza Whole 05/09/2018  . Influenza-Unspecified 05/28/2017  . Pneumococcal Conjugate-13 07/13/2017  . Pneumococcal Polysaccharide-23 04/07/2002  . Tdap 07/13/2017, 10/08/2017   Pertinent  Health Maintenance Due  Topic Date Due  . INFLUENZA VACCINE  Completed  . DEXA SCAN  Completed  . PNA vac Low Risk Adult  Completed   Fall Risk  10/05/2017  Falls in the past year? No   Functional Status Survey:    Vitals:   07/17/18 1319  BP: 130/80  Pulse: 76  Resp: 17  Temp: (!) 97.2 F (36.2 C)  SpO2: 98%  Weight: 123 lb 6.4 oz (56 kg)  Height: 5\' 4"  (1.626 m)   Body mass index is 21.18 kg/m. Physical Exam  Constitutional: She appears well-developed and well-nourished.  HENT:  Head: Normocephalic and atraumatic.  Eyes: Pupils are equal, round, and reactive to light. EOM are normal.  Neck: Normal range of motion. Neck supple. No JVD present. No thyromegaly present.  Cardiovascular: Normal rate.  Irregular heart beats.  Pulmonary/Chest: Effort normal. She has no wheezes. She has no rales.  Abdominal: Soft. She exhibits no distension. There is no tenderness. There is no rebound and no guarding.  Genitourinary: Rectal exam shows guaiac positive stool.   Musculoskeletal: She exhibits edema.  Trace edema BLE. Ambulates with walker.   Neurological: She is alert. No cranial nerve deficit. She exhibits normal muscle tone. Coordination abnormal.  Oriented to person and place. Fine resting tremors in fingers.   Skin: Skin is warm and dry.  Psychiatric: She has a normal mood and affect. Her behavior is normal.    Labs reviewed: Recent Labs    12/11/17 0807  01/29/18 04/17/18 04/20/18 1337 04/25/18  NA 139   < > 141 143 144 135*  K 3.2*   < > 3.7 4.0 3.3* 3.4  CL 95*   < > 100 100 97* 93  CO2 35*   < > 33 33 35* 35  GLUCOSE 119*  --   --   --  121*  --   BUN 30*   < > 33* 39* 33* 29*  CREATININE 1.23*   < > 1.2* 1.4* 1.24* 1.2*  CALCIUM 10.0   < > 9.7 10.1 10.6* 8.9  MG  --   --  1.7  --   --   --    < > = values in this  interval not displayed.   Recent Labs    04/17/18 04/20/18 1337 04/25/18  AST 17 24 14   ALT 9 16 8   ALKPHOS 37 47 40  BILITOT  --  0.7  --   PROT 5.6 7.9 5.5  ALBUMIN 3.3 4.1 3.2   Recent Labs    12/05/17  12/11/17 0625  04/20/18 1337 04/25/18 05/01/18  WBC 8.8   < > 10.1   < > 12.8* 11.5 8.9  NEUTROABS 4,928  --  7.4  --  10.7*  --   --   HGB 12.4  --  14.2   < > 15.5* 11.9* 12.9  HCT 36  --  42.0   < > 46.7* 34* 38  MCV  --   --  90.3  --  91.2  --   --   PLT 188  --  247   < > 269 221 275   < > = values in this interval not displayed.   Lab Results  Component Value Date   TSH 0.90 07/12/2017   No results found for: HGBA1C No results found for: CHOL, HDL, LDLCALC, LDLDIRECT, TRIG, CHOLHDL  Significant Diagnostic Results in last 30 days:  No results found.  Assessment/Plan Mixed Alzheimer's and vascular dementia Continue SNF [facility] for safety and care assistance.   Tremor, essential In fingers, not disabling, continue Propranolol 80mg  qd.   Wedge compression fracture of T12 vertebra (HCC) Stable, continue Tylenol 1000mg  bid po.   Chronic anticoagulation INR goal is 2-3, continue Coumadin for  thromboembolic risk reduction.   Chronic a-fib (HCC) Heart rate is in control.   Edema Trace edema BLE. Continue Chlorthalidone 25mg  qd, update BMP  Depression with anxiety Mood is stable, continue Sertraline 25mg  qd.   Weight loss #6Ibs in the past month, will encourage oral intake, weigh 2x/week, observe.      Family/ staff Communication: plan of care reviewed with the patient and charge nurse.   Labs/tests ordered:  BMP  Time spend 25 minutes.

## 2018-07-17 NOTE — Assessment & Plan Note (Signed)
Continue [facility] for safety and care assistance.  

## 2018-07-17 NOTE — Assessment & Plan Note (Signed)
INR goal is 2-3, continue Coumadin for thromboembolic risk reduction.

## 2018-07-17 NOTE — Assessment & Plan Note (Signed)
Stable, continue Tylenol 1000mg  bid po.

## 2018-07-17 NOTE — Assessment & Plan Note (Signed)
Trace edema BLE. Continue Chlorthalidone 25mg  qd, update BMP

## 2018-07-17 NOTE — Assessment & Plan Note (Signed)
Mood is stable, continue Sertraline 25mg qd.  

## 2018-07-17 NOTE — Assessment & Plan Note (Signed)
#  6Ibs in the past month, will encourage oral intake, weigh 2x/week, observe.

## 2018-07-18 DIAGNOSIS — R609 Edema, unspecified: Secondary | ICD-10-CM | POA: Diagnosis not present

## 2018-07-18 LAB — BASIC METABOLIC PANEL
BUN: 28 — AB (ref 4–21)
CREATININE: 1.1 (ref <=1.1)
Glucose: 84
Potassium: 3.6 (ref 3.4–5.3)
SODIUM: 140 (ref 137–147)

## 2018-07-19 ENCOUNTER — Other Ambulatory Visit: Payer: Self-pay | Admitting: *Deleted

## 2018-07-19 LAB — BASIC METABOLIC PANEL
CHLORIDE: 98
Calcium: 9.9
Carbon Dioxide, Total: 33
GFR CALC NON AF AMER: 41

## 2018-08-13 DIAGNOSIS — I4891 Unspecified atrial fibrillation: Secondary | ICD-10-CM | POA: Diagnosis not present

## 2018-08-14 ENCOUNTER — Encounter: Payer: Self-pay | Admitting: Nurse Practitioner

## 2018-08-14 ENCOUNTER — Non-Acute Institutional Stay (SKILLED_NURSING_FACILITY): Payer: Medicare Other | Admitting: Nurse Practitioner

## 2018-08-14 DIAGNOSIS — I482 Chronic atrial fibrillation, unspecified: Secondary | ICD-10-CM

## 2018-08-14 DIAGNOSIS — F0150 Vascular dementia without behavioral disturbance: Secondary | ICD-10-CM

## 2018-08-14 DIAGNOSIS — G309 Alzheimer's disease, unspecified: Secondary | ICD-10-CM | POA: Diagnosis not present

## 2018-08-14 DIAGNOSIS — R609 Edema, unspecified: Secondary | ICD-10-CM | POA: Diagnosis not present

## 2018-08-14 DIAGNOSIS — F418 Other specified anxiety disorders: Secondary | ICD-10-CM | POA: Diagnosis not present

## 2018-08-14 DIAGNOSIS — G250 Essential tremor: Secondary | ICD-10-CM | POA: Diagnosis not present

## 2018-08-14 DIAGNOSIS — F0280 Dementia in other diseases classified elsewhere without behavioral disturbance: Secondary | ICD-10-CM | POA: Diagnosis not present

## 2018-08-14 DIAGNOSIS — S22080A Wedge compression fracture of T11-T12 vertebra, initial encounter for closed fracture: Secondary | ICD-10-CM | POA: Diagnosis not present

## 2018-08-14 NOTE — Assessment & Plan Note (Signed)
Continue [facility] for safety and care assistance.  

## 2018-08-14 NOTE — Progress Notes (Signed)
Location:  [facility] Room Number: 31 Place of Service:  SNF (31) Provider:  Chipper Oman  NP  Mahlon Gammon, MD  Patient Care Team: Mahlon Gammon, MD as PCP - General (Internal Medicine) Nahser, Deloris Ping, MD as PCP - Cardiology (Cardiology) Mast, Man X, NP as Nurse Practitioner (Internal Medicine)  Extended Emergency Contact Information Primary Emergency Contact: Morgan,Tom Address: [address]          [place], [place] [address] [place] Home Phone: [phone] Relation: Friend Secondary Emergency Contact: Brandy Hale [place] of [place] Home Phone: [phone] Relation: Niece  Code Status:  DNR Goals of care: Advanced Directive information Advanced Directives 08/14/2018  Does Patient Have a Medical Advance Directive? Yes  Type of Estate agent of [place];Out of facility DNR (pink MOST or yellow form)  Does patient want to make changes to medical advance directive? No - Patient declined  Copy of Healthcare Power of Attorney in Chart? Yes - validated most recent copy scanned in chart (See row information)  Would patient like information on creating a medical advance directive? -  Pre-existing out of facility DNR order (yellow form or pink MOST form) Yellow form placed in chart (order not valid for inpatient use)     Chief Complaint  Patient presents with  . Medical Management of Chronic Issues    HPI:  Pt is a 85 y.o. female seen today for medical management of chronic diseases.     The patient history of Afib, heart rate is in control, on Propranolol 80mg  qd. Her finger resting tremor in fingers remains no change, not disabling. On coumadin for thromboembolic risk reduction. Her mood is stable on Sertraline 25mg  qd. PVD/edema, trace, on Chlorthalidone 25mg  qd, Kcl daily. Lower back pain is stable, on Tylenol 1000mg  bid.  Past Medical History:  Diagnosis Date  . Anticoagulant  long-term use   . Atrial fibrillation (HCC)   . CHF (congestive heart failure) (HCC)   . Chronic anticoagulation   . Hypertension   . Raynaud phenomenon   . Tremor, essential    Past Surgical History:  Procedure Laterality Date  . CHOLECYSTECTOMY    . TONSILLECTOMY      Allergies  Allergen Reactions  . Fosamax [Alendronate]   . Other Nausea And Vomiting    Green pepper    Outpatient Encounter Medications as of 08/14/2018  Medication Sig  . acetaminophen (TYLENOL) 500 MG tablet Take 500 mg by mouth every 8 (eight) hours as needed for mild pain or moderate pain. Take 1000 mg twice daily.  Marland Kitchen acetaminophen (TYLENOL) 500 MG tablet Take 1,000 mg by mouth 2 (two) times daily.  . Calcium Carbonate-Vit D-Min (CALTRATE 600+D PLUS MINERALS) 600-800 MG-UNIT TABS Take 1 tablet by mouth 2 (two) times daily.   . chlorthalidone (HYGROTON) 25 MG tablet Take 25 mg by mouth daily.   . hydroxypropyl methylcellulose / hypromellose (ISOPTO TEARS / GONIOVISC) 2.5 % ophthalmic solution Place 1 drop into both eyes 3 (three) times daily.  . potassium chloride (K-DUR) 10 MEQ tablet Take 20 mEq by mouth daily.  . propranolol ER (INDERAL LA) 80 MG 24 hr capsule TAKE 1 CAPSULE BY MOUTH  DAILY  . sertraline (ZOLOFT) 25 MG tablet Take 25 mg by mouth daily.  Marland Kitchen warfarin (COUMADIN) 3 MG tablet Take 3 mg by mouth daily.   No facility-administered encounter medications on file as of 08/14/2018.     Review of Systems  Constitutional: Negative for activity change, appetite change, chills, diaphoresis, fatigue, fever and unexpected weight change.  HENT: Positive for hearing loss. Negative for congestion and voice change.   Respiratory: Negative for cough, shortness of breath and wheezing.   Cardiovascular: Positive for leg swelling. Negative for chest pain and palpitations.  Gastrointestinal: Negative for abdominal distention, abdominal pain, constipation, diarrhea, nausea and vomiting.  Genitourinary: Negative for  difficulty urinating, dysuria and urgency.  Musculoskeletal: Positive for arthralgias, back pain and gait problem.  Skin: Negative for color change and pallor.  Neurological: Positive for tremors. Negative for dizziness, speech difficulty, weakness and headaches.       Dementia  Psychiatric/Behavioral: Negative for agitation, behavioral problems, hallucinations and sleep disturbance. The patient is not nervous/anxious.     Immunization History  Administered Date(s) Administered  . Influenza Whole 05/09/2018  . Influenza-Unspecified 05/28/2017  . Pneumococcal Conjugate-13 07/13/2017  . Pneumococcal Polysaccharide-23 04/07/2002  . Tdap 07/13/2017, 10/08/2017   Pertinent  Health Maintenance Due  Topic Date Due  . INFLUENZA VACCINE  Completed  . DEXA SCAN  Completed  . PNA vac Low Risk Adult  Completed   Fall Risk  10/05/2017  Falls in the past year? No   Functional Status Survey:    Vitals:   08/14/18 1034  BP: 132/68  Pulse: 72  Resp: 18  Temp: 98.2 F (36.8 C)  SpO2: 98%  Weight: 125 lb 3.2 oz (56.8 kg)  Height: 5\' 4"  (1.626 m)   Body mass index is 21.49 kg/m. Physical Exam Constitutional:      General: She is not in acute distress.    Appearance: Normal appearance. She is normal weight. She is not ill-appearing, toxic-appearing or diaphoretic.  HENT:     Head: Normocephalic and atraumatic.     Nose: Nose normal.     Mouth/Throat:     Mouth: Mucous membranes are moist.  Eyes:     Pupils: Pupils are equal, round, and reactive to light.  Neck:     Musculoskeletal: Normal range of motion.  Cardiovascular:     Rate and Rhythm: Normal rate. Rhythm irregular.     Heart sounds: No murmur.  Pulmonary:     Effort: Pulmonary effort is normal.     Breath sounds: No wheezing, rhonchi or rales.  Abdominal:     General: There is no distension.     Palpations: Abdomen is soft.     Tenderness: There is no abdominal tenderness. There is no guarding or rebound.    Musculoskeletal:     Right lower leg: Edema present.     Left lower leg: Edema present.     Comments: Trace edema BLE. Ambulates with walker.   Skin:    General: Skin is warm and dry.     Coloration: Skin is not pale.     Findings: No erythema or rash.  Neurological:     General: No focal deficit present.     Mental Status: She is alert. Mental status is at baseline.     Motor: No weakness.     Coordination: Coordination normal.     Gait: Gait abnormal.     Comments: Oriented to person and place.      Labs reviewed: Recent Labs    12/11/17 0807  01/29/18  04/20/18 1337 04/25/18 07/18/18  NA 139   < > 141   < > 144 135* 140  K 3.2*   < > 3.7   < > 3.3* 3.4 3.6  CL 95*   < >  100   < > 97* 93 98  CO2 35*   < > 33   < > 35* 35 33  GLUCOSE 119*  --   --   --  121*  --   --   BUN 30*   < > 33*   < > 33* 29* 28*  CREATININE 1.23*   < > 1.2*   < > 1.24* 1.2* 1.1  CALCIUM 10.0   < > 9.7   < > 10.6* 8.9 9.9  MG  --   --  1.7  --   --   --   --    < > = values in this interval not displayed.   Recent Labs    04/17/18 04/20/18 1337 04/25/18  AST 17 24 14   ALT 9 16 8   ALKPHOS 37 47 40  BILITOT  --  0.7  --   PROT 5.6 7.9 5.5  ALBUMIN 3.3 4.1 3.2   Recent Labs    12/05/17  12/11/17 0625  04/20/18 1337 04/25/18 05/01/18  WBC 8.8   < > 10.1   < > 12.8* 11.5 8.9  NEUTROABS 4,928  --  7.4  --  10.7*  --   --   HGB 12.4  --  14.2   < > 15.5* 11.9* 12.9  HCT 36  --  42.0   < > 46.7* 34* 38  MCV  --   --  90.3  --  91.2  --   --   PLT 188  --  247   < > 269 221 275   < > = values in this interval not displayed.   Lab Results  Component Value Date   TSH 0.90 07/12/2017   No results found for: HGBA1C No results found for: CHOL, HDL, LDLCALC, LDLDIRECT, TRIG, CHOLHDL  Significant Diagnostic Results in last 30 days:  No results found.  Assessment/Plan Chronic a-fib (HCC) Heart rate is in control, continue Propranolol 80mg  qd, Coumadin for goal of INR 2-3  Mixed  Alzheimer's and vascular dementia Continue SNF [facility] for safety and care assistance.   Tremor, essential Fine resting tremor in fingers, no change, not disabling. Continue Propranolol.   Wedge compression fracture of T12 vertebra (HCC) Stable, under went Ortho 05/02/18, continue Tylenol 1000mg  bid.   Edema Stable, Echo EF 75% 05/16/18, continue Chorthalidone 25mg  qd.   Depression with anxiety Her mood is stable, continue Sertraline 25mg  qd.      Family/ staff Communication: plan of care reviewed with the patient and charge nurse.   Labs/tests ordered: none  Time spend 25 minutes.

## 2018-08-14 NOTE — Assessment & Plan Note (Signed)
Stable, Echo EF 75% 05/16/18, continue Chorthalidone 25mg  qd.

## 2018-08-14 NOTE — Assessment & Plan Note (Signed)
Fine resting tremor in fingers, no change, not disabling. Continue Propranolol.

## 2018-08-14 NOTE — Assessment & Plan Note (Signed)
Heart rate is in control, continue Propranolol 80mg  qd, Coumadin for goal of INR 2-3

## 2018-08-14 NOTE — Assessment & Plan Note (Signed)
Stable, under went Ortho 05/02/18, continue Tylenol 1000mg  bid.

## 2018-08-14 NOTE — Assessment & Plan Note (Signed)
Her mood is stable, continue Sertraline 25mg qd.  

## 2018-08-15 ENCOUNTER — Encounter: Payer: Self-pay | Admitting: Nurse Practitioner

## 2018-08-19 DIAGNOSIS — G250 Essential tremor: Secondary | ICD-10-CM | POA: Diagnosis not present

## 2018-08-19 DIAGNOSIS — N3946 Mixed incontinence: Secondary | ICD-10-CM | POA: Diagnosis not present

## 2018-08-19 DIAGNOSIS — Z9181 History of falling: Secondary | ICD-10-CM | POA: Diagnosis not present

## 2018-08-19 DIAGNOSIS — R2681 Unsteadiness on feet: Secondary | ICD-10-CM | POA: Diagnosis not present

## 2018-08-19 DIAGNOSIS — R278 Other lack of coordination: Secondary | ICD-10-CM | POA: Diagnosis not present

## 2018-08-19 DIAGNOSIS — M6281 Muscle weakness (generalized): Secondary | ICD-10-CM | POA: Diagnosis not present

## 2018-08-19 DIAGNOSIS — R1312 Dysphagia, oropharyngeal phase: Secondary | ICD-10-CM | POA: Diagnosis not present

## 2018-08-19 DIAGNOSIS — G309 Alzheimer's disease, unspecified: Secondary | ICD-10-CM | POA: Diagnosis not present

## 2018-08-20 DIAGNOSIS — R278 Other lack of coordination: Secondary | ICD-10-CM | POA: Diagnosis not present

## 2018-08-20 DIAGNOSIS — N3946 Mixed incontinence: Secondary | ICD-10-CM | POA: Diagnosis not present

## 2018-08-20 DIAGNOSIS — M6281 Muscle weakness (generalized): Secondary | ICD-10-CM | POA: Diagnosis not present

## 2018-08-20 DIAGNOSIS — G250 Essential tremor: Secondary | ICD-10-CM | POA: Diagnosis not present

## 2018-08-20 DIAGNOSIS — R2681 Unsteadiness on feet: Secondary | ICD-10-CM | POA: Diagnosis not present

## 2018-08-20 DIAGNOSIS — Z9181 History of falling: Secondary | ICD-10-CM | POA: Diagnosis not present

## 2018-08-21 DIAGNOSIS — N3946 Mixed incontinence: Secondary | ICD-10-CM | POA: Diagnosis not present

## 2018-08-21 DIAGNOSIS — R278 Other lack of coordination: Secondary | ICD-10-CM | POA: Diagnosis not present

## 2018-08-21 DIAGNOSIS — Z9181 History of falling: Secondary | ICD-10-CM | POA: Diagnosis not present

## 2018-08-21 DIAGNOSIS — M6281 Muscle weakness (generalized): Secondary | ICD-10-CM | POA: Diagnosis not present

## 2018-08-21 DIAGNOSIS — R2681 Unsteadiness on feet: Secondary | ICD-10-CM | POA: Diagnosis not present

## 2018-08-21 DIAGNOSIS — G250 Essential tremor: Secondary | ICD-10-CM | POA: Diagnosis not present

## 2018-08-22 DIAGNOSIS — G250 Essential tremor: Secondary | ICD-10-CM | POA: Diagnosis not present

## 2018-08-22 DIAGNOSIS — R2681 Unsteadiness on feet: Secondary | ICD-10-CM | POA: Diagnosis not present

## 2018-08-22 DIAGNOSIS — R278 Other lack of coordination: Secondary | ICD-10-CM | POA: Diagnosis not present

## 2018-08-22 DIAGNOSIS — N3946 Mixed incontinence: Secondary | ICD-10-CM | POA: Diagnosis not present

## 2018-08-22 DIAGNOSIS — Z9181 History of falling: Secondary | ICD-10-CM | POA: Diagnosis not present

## 2018-08-22 DIAGNOSIS — M6281 Muscle weakness (generalized): Secondary | ICD-10-CM | POA: Diagnosis not present

## 2018-08-23 DIAGNOSIS — R2681 Unsteadiness on feet: Secondary | ICD-10-CM | POA: Diagnosis not present

## 2018-08-23 DIAGNOSIS — G250 Essential tremor: Secondary | ICD-10-CM | POA: Diagnosis not present

## 2018-08-23 DIAGNOSIS — M6281 Muscle weakness (generalized): Secondary | ICD-10-CM | POA: Diagnosis not present

## 2018-08-23 DIAGNOSIS — Z9181 History of falling: Secondary | ICD-10-CM | POA: Diagnosis not present

## 2018-08-23 DIAGNOSIS — N3946 Mixed incontinence: Secondary | ICD-10-CM | POA: Diagnosis not present

## 2018-08-23 DIAGNOSIS — R278 Other lack of coordination: Secondary | ICD-10-CM | POA: Diagnosis not present

## 2018-08-27 DIAGNOSIS — Z9181 History of falling: Secondary | ICD-10-CM | POA: Diagnosis not present

## 2018-08-27 DIAGNOSIS — G250 Essential tremor: Secondary | ICD-10-CM | POA: Diagnosis not present

## 2018-08-27 DIAGNOSIS — M6281 Muscle weakness (generalized): Secondary | ICD-10-CM | POA: Diagnosis not present

## 2018-08-27 DIAGNOSIS — R2681 Unsteadiness on feet: Secondary | ICD-10-CM | POA: Diagnosis not present

## 2018-08-27 DIAGNOSIS — R278 Other lack of coordination: Secondary | ICD-10-CM | POA: Diagnosis not present

## 2018-08-27 DIAGNOSIS — N3946 Mixed incontinence: Secondary | ICD-10-CM | POA: Diagnosis not present

## 2018-08-29 DIAGNOSIS — Z9181 History of falling: Secondary | ICD-10-CM | POA: Diagnosis not present

## 2018-08-29 DIAGNOSIS — G250 Essential tremor: Secondary | ICD-10-CM | POA: Diagnosis not present

## 2018-08-29 DIAGNOSIS — R278 Other lack of coordination: Secondary | ICD-10-CM | POA: Diagnosis not present

## 2018-08-29 DIAGNOSIS — N3946 Mixed incontinence: Secondary | ICD-10-CM | POA: Diagnosis not present

## 2018-08-29 DIAGNOSIS — M6281 Muscle weakness (generalized): Secondary | ICD-10-CM | POA: Diagnosis not present

## 2018-08-29 DIAGNOSIS — R2681 Unsteadiness on feet: Secondary | ICD-10-CM | POA: Diagnosis not present

## 2018-08-30 DIAGNOSIS — Z9181 History of falling: Secondary | ICD-10-CM | POA: Diagnosis not present

## 2018-08-30 DIAGNOSIS — G250 Essential tremor: Secondary | ICD-10-CM | POA: Diagnosis not present

## 2018-08-30 DIAGNOSIS — R278 Other lack of coordination: Secondary | ICD-10-CM | POA: Diagnosis not present

## 2018-08-30 DIAGNOSIS — N3946 Mixed incontinence: Secondary | ICD-10-CM | POA: Diagnosis not present

## 2018-08-30 DIAGNOSIS — R2681 Unsteadiness on feet: Secondary | ICD-10-CM | POA: Diagnosis not present

## 2018-08-30 DIAGNOSIS — M6281 Muscle weakness (generalized): Secondary | ICD-10-CM | POA: Diagnosis not present

## 2018-09-02 DIAGNOSIS — G250 Essential tremor: Secondary | ICD-10-CM | POA: Diagnosis not present

## 2018-09-02 DIAGNOSIS — N3946 Mixed incontinence: Secondary | ICD-10-CM | POA: Diagnosis not present

## 2018-09-02 DIAGNOSIS — Z9181 History of falling: Secondary | ICD-10-CM | POA: Diagnosis not present

## 2018-09-02 DIAGNOSIS — M6281 Muscle weakness (generalized): Secondary | ICD-10-CM | POA: Diagnosis not present

## 2018-09-02 DIAGNOSIS — R2681 Unsteadiness on feet: Secondary | ICD-10-CM | POA: Diagnosis not present

## 2018-09-02 DIAGNOSIS — R278 Other lack of coordination: Secondary | ICD-10-CM | POA: Diagnosis not present

## 2018-09-03 DIAGNOSIS — M6281 Muscle weakness (generalized): Secondary | ICD-10-CM | POA: Diagnosis not present

## 2018-09-03 DIAGNOSIS — R278 Other lack of coordination: Secondary | ICD-10-CM | POA: Diagnosis not present

## 2018-09-03 DIAGNOSIS — I4891 Unspecified atrial fibrillation: Secondary | ICD-10-CM | POA: Diagnosis not present

## 2018-09-03 DIAGNOSIS — G250 Essential tremor: Secondary | ICD-10-CM | POA: Diagnosis not present

## 2018-09-03 DIAGNOSIS — N3946 Mixed incontinence: Secondary | ICD-10-CM | POA: Diagnosis not present

## 2018-09-03 DIAGNOSIS — Z9181 History of falling: Secondary | ICD-10-CM | POA: Diagnosis not present

## 2018-09-03 DIAGNOSIS — R2681 Unsteadiness on feet: Secondary | ICD-10-CM | POA: Diagnosis not present

## 2018-09-03 LAB — PROTIME-INR: PROTIME: 14.9 — AB (ref 10.0–13.8)

## 2018-09-03 LAB — POCT INR: INR: 1.5 — AB (ref <=1.1)

## 2018-09-04 ENCOUNTER — Other Ambulatory Visit: Payer: Self-pay | Admitting: *Deleted

## 2018-09-04 DIAGNOSIS — G250 Essential tremor: Secondary | ICD-10-CM | POA: Diagnosis not present

## 2018-09-04 DIAGNOSIS — R2681 Unsteadiness on feet: Secondary | ICD-10-CM | POA: Diagnosis not present

## 2018-09-04 DIAGNOSIS — M6281 Muscle weakness (generalized): Secondary | ICD-10-CM | POA: Diagnosis not present

## 2018-09-04 DIAGNOSIS — R278 Other lack of coordination: Secondary | ICD-10-CM | POA: Diagnosis not present

## 2018-09-04 DIAGNOSIS — N3946 Mixed incontinence: Secondary | ICD-10-CM | POA: Diagnosis not present

## 2018-09-04 DIAGNOSIS — Z9181 History of falling: Secondary | ICD-10-CM | POA: Diagnosis not present

## 2018-09-05 DIAGNOSIS — N3946 Mixed incontinence: Secondary | ICD-10-CM | POA: Diagnosis not present

## 2018-09-05 DIAGNOSIS — M6281 Muscle weakness (generalized): Secondary | ICD-10-CM | POA: Diagnosis not present

## 2018-09-05 DIAGNOSIS — R278 Other lack of coordination: Secondary | ICD-10-CM | POA: Diagnosis not present

## 2018-09-05 DIAGNOSIS — R2681 Unsteadiness on feet: Secondary | ICD-10-CM | POA: Diagnosis not present

## 2018-09-05 DIAGNOSIS — G250 Essential tremor: Secondary | ICD-10-CM | POA: Diagnosis not present

## 2018-09-05 DIAGNOSIS — Z9181 History of falling: Secondary | ICD-10-CM | POA: Diagnosis not present

## 2018-09-06 DIAGNOSIS — R278 Other lack of coordination: Secondary | ICD-10-CM | POA: Diagnosis not present

## 2018-09-06 DIAGNOSIS — M6281 Muscle weakness (generalized): Secondary | ICD-10-CM | POA: Diagnosis not present

## 2018-09-06 DIAGNOSIS — G250 Essential tremor: Secondary | ICD-10-CM | POA: Diagnosis not present

## 2018-09-06 DIAGNOSIS — R2681 Unsteadiness on feet: Secondary | ICD-10-CM | POA: Diagnosis not present

## 2018-09-06 DIAGNOSIS — Z9181 History of falling: Secondary | ICD-10-CM | POA: Diagnosis not present

## 2018-09-06 DIAGNOSIS — N3946 Mixed incontinence: Secondary | ICD-10-CM | POA: Diagnosis not present

## 2018-09-09 DIAGNOSIS — G250 Essential tremor: Secondary | ICD-10-CM | POA: Diagnosis not present

## 2018-09-09 DIAGNOSIS — R278 Other lack of coordination: Secondary | ICD-10-CM | POA: Diagnosis not present

## 2018-09-09 DIAGNOSIS — Z9181 History of falling: Secondary | ICD-10-CM | POA: Diagnosis not present

## 2018-09-09 DIAGNOSIS — N3946 Mixed incontinence: Secondary | ICD-10-CM | POA: Diagnosis not present

## 2018-09-09 DIAGNOSIS — G309 Alzheimer's disease, unspecified: Secondary | ICD-10-CM | POA: Diagnosis not present

## 2018-09-09 DIAGNOSIS — R1312 Dysphagia, oropharyngeal phase: Secondary | ICD-10-CM | POA: Diagnosis not present

## 2018-09-09 DIAGNOSIS — M6281 Muscle weakness (generalized): Secondary | ICD-10-CM | POA: Diagnosis not present

## 2018-09-09 DIAGNOSIS — R2681 Unsteadiness on feet: Secondary | ICD-10-CM | POA: Diagnosis not present

## 2018-09-10 DIAGNOSIS — R2681 Unsteadiness on feet: Secondary | ICD-10-CM | POA: Diagnosis not present

## 2018-09-10 DIAGNOSIS — G309 Alzheimer's disease, unspecified: Secondary | ICD-10-CM | POA: Diagnosis not present

## 2018-09-10 DIAGNOSIS — G250 Essential tremor: Secondary | ICD-10-CM | POA: Diagnosis not present

## 2018-09-10 DIAGNOSIS — M6281 Muscle weakness (generalized): Secondary | ICD-10-CM | POA: Diagnosis not present

## 2018-09-10 DIAGNOSIS — N3946 Mixed incontinence: Secondary | ICD-10-CM | POA: Diagnosis not present

## 2018-09-10 DIAGNOSIS — Z9181 History of falling: Secondary | ICD-10-CM | POA: Diagnosis not present

## 2018-09-11 DIAGNOSIS — G250 Essential tremor: Secondary | ICD-10-CM | POA: Diagnosis not present

## 2018-09-11 DIAGNOSIS — R2681 Unsteadiness on feet: Secondary | ICD-10-CM | POA: Diagnosis not present

## 2018-09-11 DIAGNOSIS — N3946 Mixed incontinence: Secondary | ICD-10-CM | POA: Diagnosis not present

## 2018-09-11 DIAGNOSIS — G309 Alzheimer's disease, unspecified: Secondary | ICD-10-CM | POA: Diagnosis not present

## 2018-09-11 DIAGNOSIS — M6281 Muscle weakness (generalized): Secondary | ICD-10-CM | POA: Diagnosis not present

## 2018-09-11 DIAGNOSIS — Z9181 History of falling: Secondary | ICD-10-CM | POA: Diagnosis not present

## 2018-09-12 ENCOUNTER — Encounter: Payer: Self-pay | Admitting: Nurse Practitioner

## 2018-09-12 DIAGNOSIS — G250 Essential tremor: Secondary | ICD-10-CM | POA: Diagnosis not present

## 2018-09-12 DIAGNOSIS — R1310 Dysphagia, unspecified: Secondary | ICD-10-CM | POA: Insufficient documentation

## 2018-09-12 DIAGNOSIS — G309 Alzheimer's disease, unspecified: Secondary | ICD-10-CM | POA: Diagnosis not present

## 2018-09-12 DIAGNOSIS — N3946 Mixed incontinence: Secondary | ICD-10-CM | POA: Diagnosis not present

## 2018-09-12 DIAGNOSIS — Z9181 History of falling: Secondary | ICD-10-CM | POA: Diagnosis not present

## 2018-09-12 DIAGNOSIS — M6281 Muscle weakness (generalized): Secondary | ICD-10-CM | POA: Diagnosis not present

## 2018-09-12 DIAGNOSIS — R2681 Unsteadiness on feet: Secondary | ICD-10-CM | POA: Diagnosis not present

## 2018-09-13 ENCOUNTER — Non-Acute Institutional Stay (SKILLED_NURSING_FACILITY): Payer: Medicare Other | Admitting: Internal Medicine

## 2018-09-13 ENCOUNTER — Encounter: Payer: Self-pay | Admitting: Internal Medicine

## 2018-09-13 DIAGNOSIS — N183 Chronic kidney disease, stage 3 unspecified: Secondary | ICD-10-CM

## 2018-09-13 DIAGNOSIS — F0150 Vascular dementia without behavioral disturbance: Secondary | ICD-10-CM

## 2018-09-13 DIAGNOSIS — I10 Essential (primary) hypertension: Secondary | ICD-10-CM

## 2018-09-13 DIAGNOSIS — F418 Other specified anxiety disorders: Secondary | ICD-10-CM | POA: Diagnosis not present

## 2018-09-13 DIAGNOSIS — I482 Chronic atrial fibrillation, unspecified: Secondary | ICD-10-CM

## 2018-09-13 DIAGNOSIS — N3946 Mixed incontinence: Secondary | ICD-10-CM | POA: Diagnosis not present

## 2018-09-13 DIAGNOSIS — R2681 Unsteadiness on feet: Secondary | ICD-10-CM | POA: Diagnosis not present

## 2018-09-13 DIAGNOSIS — R1312 Dysphagia, oropharyngeal phase: Secondary | ICD-10-CM

## 2018-09-13 DIAGNOSIS — G250 Essential tremor: Secondary | ICD-10-CM

## 2018-09-13 DIAGNOSIS — M6281 Muscle weakness (generalized): Secondary | ICD-10-CM | POA: Diagnosis not present

## 2018-09-13 DIAGNOSIS — D638 Anemia in other chronic diseases classified elsewhere: Secondary | ICD-10-CM | POA: Diagnosis not present

## 2018-09-13 DIAGNOSIS — F0280 Dementia in other diseases classified elsewhere without behavioral disturbance: Secondary | ICD-10-CM | POA: Diagnosis not present

## 2018-09-13 DIAGNOSIS — G309 Alzheimer's disease, unspecified: Secondary | ICD-10-CM

## 2018-09-13 DIAGNOSIS — Z9181 History of falling: Secondary | ICD-10-CM | POA: Diagnosis not present

## 2018-09-13 NOTE — Progress Notes (Signed)
Location:  [facility] Room Number: 31 Place of Service:  SNF [phone](31) Provider:    Mahlon GammonGupta, Anjali L, MD  Patient Care Team: Mahlon GammonGupta, Anjali L, MD as PCP - General (Internal Medicine) Nahser, Deloris PingPhilip J, MD as PCP - Cardiology (Cardiology) Mast, Man X, NP as Nurse Practitioner (Internal Medicine)  Extended Emergency Contact Information Primary Emergency Contact: Morgan,Tom Address: [address]          [place], [place] [address] [place] Home Phone: [phone] Relation: Friend Secondary Emergency Contact: Brandy HaleFletcher,Christa  [place] Home Phone: [phone] Relation: Niece  Code Status:  DNR Goals of care: Advanced Directive information Advanced Directives 09/13/2018  Does Patient Have a Medical Advance Directive? Yes  Type of Estate agentAdvance Directive Healthcare Power of [place];Out of facility DNR (pink MOST or yellow form)  Does patient want to make changes to medical advance directive? No - Patient declined  Copy of Healthcare Power of Attorney in Chart? -  Would patient like information on creating a medical advance directive? -  Pre-existing out of facility DNR order (yellow form or pink MOST form) Yellow form placed in chart (order not valid for inpatient use)     Chief Complaint  Patient presents with  . Medical Management of Chronic Issues    routine visit     HPI:  Pt is a 85 y.o. female seen today for medical management of chronic diseases.    Patient has h/o Atrial Fibrillation on Coumadin, LE edema, Hypertension, Dysphagia,Essential Tremor, Anemia, Dementia,and depression.  Patient is stable in the facility. No Acute issues. Per Nurses she is suppose to be in thicken Liquids but she refuses to drink Thickener.She does have cough sometimes with her food. She is also on Coumadin and refuses Blood draw and they have been doing Finger stick to follow her INR. Her weight is stable at 123 lbs. She did not give me any  history. Did not seem to be having any acute Problems.   Past Medical History:  Diagnosis Date  . Anticoagulant long-term use   . Atrial fibrillation (HCC)   . CHF (congestive heart failure) (HCC)   . Chronic anticoagulation   . Hypertension   . Raynaud phenomenon   . Tremor, essential    Past Surgical History:  Procedure Laterality Date  . CHOLECYSTECTOMY    . TONSILLECTOMY      Allergies  Allergen Reactions  . Fosamax [Alendronate]   . Other Nausea And Vomiting    Green pepper    Outpatient Encounter Medications as of 09/13/2018  Medication Sig  . acetaminophen (TYLENOL) 500 MG tablet Take 500 mg by mouth every 8 (eight) hours as needed for mild pain or moderate pain. Take 1000 mg twice daily.  Marland Kitchen. acetaminophen (TYLENOL) 500 MG tablet Take 1,000 mg by mouth 2 (two) times daily.  . Calcium Carbonate-Vit D-Min (CALTRATE 600+D PLUS MINERALS) 600-800 MG-UNIT TABS Take 1 tablet by mouth 2 (two) times daily.   . chlorthalidone (HYGROTON) 25 MG tablet Take 25 mg by mouth daily.   . hydroxypropyl methylcellulose / hypromellose (ISOPTO TEARS / GONIOVISC) 2.5 % ophthalmic solution Place 1 drop into both eyes 3 (three) times daily.  . potassium chloride (K-DUR) 10 MEQ tablet Take 20 mEq by mouth daily.  . propranolol ER (INDERAL LA) 80 MG 24 hr capsule TAKE 1 CAPSULE BY MOUTH  DAILY  . sertraline (ZOLOFT) 25 MG tablet Take 25 mg by mouth daily.  Marland Kitchen. warfarin (COUMADIN) 3  MG tablet Take 3 mg by mouth daily.   No facility-administered encounter medications on file as of 09/13/2018.     Review of Systems  Unable to perform ROS: Dementia    Immunization History  Administered Date(s) Administered  . Influenza Whole 05/09/2018  . Influenza-Unspecified 05/28/2017  . Pneumococcal Conjugate-13 07/13/2017  . Pneumococcal Polysaccharide-23 04/07/2002  . Tdap 07/13/2017, 10/08/2017   Pertinent  Health Maintenance Due  Topic Date Due  . INFLUENZA VACCINE  Completed  . DEXA SCAN  Completed    . PNA vac Low Risk Adult  Completed   Fall Risk  10/05/2017  Falls in the past year? No   Functional Status Survey:    Vitals:   09/13/18 1041  BP: 134/68  Pulse: 60  Resp: 18  Temp: 98.6 F (37 C)  SpO2: 96%  Weight: 123 lb 3.2 oz (55.9 kg)  Height: 5\' 5"  (1.651 m)   Body mass index is 20.5 kg/m. Physical Exam Constitutional:      Appearance: Normal appearance.  HENT:     Head: Normocephalic.     Nose: Nose normal.     Mouth/Throat:     Mouth: Mucous membranes are moist.     Pharynx: Oropharynx is clear.  Eyes:     Pupils: Pupils are equal, round, and reactive to light.  Neck:     Musculoskeletal: Neck supple.  Cardiovascular:     Rate and Rhythm: Normal rate. Rhythm irregular.     Pulses: Normal pulses.     Heart sounds: Normal heart sounds.  Pulmonary:     Effort: Pulmonary effort is normal. No respiratory distress.     Breath sounds: Normal breath sounds. No wheezing or rales.  Abdominal:     General: Abdomen is flat. Bowel sounds are normal. There is no distension.     Palpations: Abdomen is soft.     Tenderness: There is no abdominal tenderness.  Musculoskeletal:        General: No swelling.  Skin:    General: Skin is warm and dry.  Neurological:     General: No focal deficit present.     Mental Status: She is alert.     Comments: Answers Appropriately.. Follows simple Commands. But was not oriented to Time or Place. Did not knew her Age or DOB Walks with the Walker     Labs reviewed: Recent Labs    12/11/17 0807  01/29/18  04/20/18 1337 04/25/18 07/18/18  NA 139   < > 141   < > 144 135* 140  K 3.2*   < > 3.7   < > 3.3* 3.4 3.6  CL 95*   < > 100   < > 97* 93 98  CO2 35*   < > 33   < > 35* 35 33  GLUCOSE 119*  --   --   --  121*  --   --   BUN 30*   < > 33*   < > 33* 29* 28*  CREATININE 1.23*   < > 1.2*   < > 1.24* 1.2* 1.1  CALCIUM 10.0   < > 9.7   < > 10.6* 8.9 9.9  MG  --   --  1.7  --   --   --   --    < > = values in this interval not  displayed.   Recent Labs    04/17/18 04/20/18 1337 04/25/18  AST 17 24 14   ALT 9 16 8  ALKPHOS 37 47 40  BILITOT  --  0.7  --   PROT 5.6 7.9 5.5  ALBUMIN 3.3 4.1 3.2   Recent Labs    12/05/17  12/11/17 0625  04/20/18 1337 04/25/18 05/01/18  WBC 8.8   < > 10.1   < > 12.8* 11.5 8.9  NEUTROABS 4,928  --  7.4  --  10.7*  --   --   HGB 12.4  --  14.2   < > 15.5* 11.9* 12.9  HCT 36  --  42.0   < > 46.7* 34* 38  MCV  --   --  90.3  --  91.2  --   --   PLT 188  --  247   < > 269 221 275   < > = values in this interval not displayed.   Lab Results  Component Value Date   TSH 0.90 07/12/2017   No results found for: HGBA1C No results found for: CHOL, HDL, LDLCALC, LDLDIRECT, TRIG, CHOLHDL  Significant Diagnostic Results in last 30 days:  No results found.  Assessment/Plan  Essential hypertension Stable on Inderal and Chlorthalidone Chronic a-fib On Coumadin Dose adjusted for INR today b/w 2-3 Rate controlled on inderal If Patient keep refusing Coumadin will consider Eliquis for her D/w The nurse to talk to her POA  Oropharyngeal dysphagia Patient on Thickened Liquid but she is does not like it so drinks thin Liquids Will continue to follow  Tremor, essential Stable On Inderal  CKD (chronic kidney disease) stage 3 Creat stable  Depression with anxiety On low dose of Zoloft  Dementia Continue supportive Care Not Candidate for any Aggressive Testing     Family/ staff Communication:   Labs/tests ordered:   Total time spent in this patient care encounter was _25 minutes; greater than 50% of the visit spent counseling patient, reviewing records , Labs and coordinating care for problems addressed at this encounter.

## 2018-09-14 DIAGNOSIS — Z9181 History of falling: Secondary | ICD-10-CM | POA: Diagnosis not present

## 2018-09-14 DIAGNOSIS — R2681 Unsteadiness on feet: Secondary | ICD-10-CM | POA: Diagnosis not present

## 2018-09-14 DIAGNOSIS — M6281 Muscle weakness (generalized): Secondary | ICD-10-CM | POA: Diagnosis not present

## 2018-09-14 DIAGNOSIS — G250 Essential tremor: Secondary | ICD-10-CM | POA: Diagnosis not present

## 2018-09-14 DIAGNOSIS — N3946 Mixed incontinence: Secondary | ICD-10-CM | POA: Diagnosis not present

## 2018-09-14 DIAGNOSIS — G309 Alzheimer's disease, unspecified: Secondary | ICD-10-CM | POA: Diagnosis not present

## 2018-09-16 DIAGNOSIS — R791 Abnormal coagulation profile: Secondary | ICD-10-CM | POA: Diagnosis not present

## 2018-09-17 DIAGNOSIS — G250 Essential tremor: Secondary | ICD-10-CM | POA: Diagnosis not present

## 2018-09-17 DIAGNOSIS — Z9181 History of falling: Secondary | ICD-10-CM | POA: Diagnosis not present

## 2018-09-17 DIAGNOSIS — R2681 Unsteadiness on feet: Secondary | ICD-10-CM | POA: Diagnosis not present

## 2018-09-17 DIAGNOSIS — N3946 Mixed incontinence: Secondary | ICD-10-CM | POA: Diagnosis not present

## 2018-09-17 DIAGNOSIS — G309 Alzheimer's disease, unspecified: Secondary | ICD-10-CM | POA: Diagnosis not present

## 2018-09-17 DIAGNOSIS — M6281 Muscle weakness (generalized): Secondary | ICD-10-CM | POA: Diagnosis not present

## 2018-09-18 ENCOUNTER — Encounter: Payer: Self-pay | Admitting: Nurse Practitioner

## 2018-09-18 ENCOUNTER — Non-Acute Institutional Stay (SKILLED_NURSING_FACILITY): Payer: Medicare Other | Admitting: Nurse Practitioner

## 2018-09-18 DIAGNOSIS — T148XXA Other injury of unspecified body region, initial encounter: Secondary | ICD-10-CM | POA: Insufficient documentation

## 2018-09-18 DIAGNOSIS — Z9181 History of falling: Secondary | ICD-10-CM | POA: Diagnosis not present

## 2018-09-18 DIAGNOSIS — F0150 Vascular dementia without behavioral disturbance: Secondary | ICD-10-CM

## 2018-09-18 DIAGNOSIS — R2681 Unsteadiness on feet: Secondary | ICD-10-CM | POA: Diagnosis not present

## 2018-09-18 DIAGNOSIS — S7012XA Contusion of left thigh, initial encounter: Secondary | ICD-10-CM

## 2018-09-18 DIAGNOSIS — N3946 Mixed incontinence: Secondary | ICD-10-CM | POA: Diagnosis not present

## 2018-09-18 DIAGNOSIS — F0280 Dementia in other diseases classified elsewhere without behavioral disturbance: Secondary | ICD-10-CM | POA: Diagnosis not present

## 2018-09-18 DIAGNOSIS — G250 Essential tremor: Secondary | ICD-10-CM | POA: Diagnosis not present

## 2018-09-18 DIAGNOSIS — G309 Alzheimer's disease, unspecified: Secondary | ICD-10-CM | POA: Diagnosis not present

## 2018-09-18 DIAGNOSIS — M6281 Muscle weakness (generalized): Secondary | ICD-10-CM | POA: Diagnosis not present

## 2018-09-18 DIAGNOSIS — Z7901 Long term (current) use of anticoagulants: Secondary | ICD-10-CM

## 2018-09-18 NOTE — Progress Notes (Signed)
Location:  [facility] Room Number: 31 Place of Service:  SNF (31) Provider:  Chipper Oman  NP  Mahlon Gammon, MD  Patient Care Team: Mahlon Gammon, MD as PCP - General (Internal Medicine) Nahser, Deloris Ping, MD as PCP - Cardiology (Cardiology) Natalina Wieting X, NP as Nurse Practitioner (Internal Medicine)  Extended Emergency Contact Information Primary Emergency Contact: AngelicaTom Address: [address]          [place], [place] [address] [place] of [place] Home Phone: [phone] Relation: Friend Secondary Emergency Contact: AngelicaChrista  [place] Home Phone: [phone] Relation: Niece  Code Status:  DNR Goals of care: Advanced Directive information Advanced Directives 09/13/2018  Does Patient Have a Medical Advance Directive? Yes  Type of Estate agent of [place];Out of facility DNR (pink MOST or yellow form)  Does patient want to make changes to medical advance directive? No - Patient declined  Copy of Healthcare Power of Attorney in Chart? -  Would patient like information on creating a medical advance directive? -  Pre-existing out of facility DNR order (yellow form or pink MOST form) Yellow form placed in chart (order not valid for inpatient use)     Chief Complaint  Patient presents with  . Acute Visit    Large bruise on (R) leg    HPI:  Pt is a 85 y.o. female seen today for an acute visit for a large left lateral leg bruise from left hip to above the left knee. She denied pain or no recollection of the mechanism of the event. AFib, heart rate is in control, on Coumadin chronically. HPI was provided with assistance of staff.    Past Medical History:  Diagnosis Date  . Anticoagulant long-term use   . Atrial fibrillation (HCC)   . CHF (congestive heart failure) (HCC)   . Chronic anticoagulation   . Hypertension   . Raynaud phenomenon   . Tremor, essential    Past Surgical History:    Procedure Laterality Date  . CHOLECYSTECTOMY    . TONSILLECTOMY      Allergies  Allergen Reactions  . Fosamax [Alendronate]   . Other Nausea And Vomiting    Green pepper    Outpatient Encounter Medications as of 09/18/2018  Medication Sig  . acetaminophen (TYLENOL) 500 MG tablet Take 500 mg by mouth every 8 (eight) hours as needed for mild pain or moderate pain. Take 1000 mg twice daily.  Marland Kitchen acetaminophen (TYLENOL) 500 MG tablet Take 1,000 mg by mouth 2 (two) times daily.  . Calcium Carbonate-Vit D-Min (CALTRATE 600+D PLUS MINERALS) 600-800 MG-UNIT TABS Take 1 tablet by mouth 2 (two) times daily.   . chlorthalidone (HYGROTON) 25 MG tablet Take 25 mg by mouth daily.   . hydroxypropyl methylcellulose / hypromellose (ISOPTO TEARS / GONIOVISC) 2.5 % ophthalmic solution Place 1 drop into both eyes 3 (three) times daily.  . potassium chloride (K-DUR) 10 MEQ tablet Take 20 mEq by mouth daily.  . propranolol ER (INDERAL LA) 80 MG 24 hr capsule TAKE 1 CAPSULE BY MOUTH  DAILY  . sertraline (ZOLOFT) 25 MG tablet Take 25 mg by mouth daily.  Marland Kitchen warfarin (COUMADIN) 0.5 mg TABS tablet Take 0.5 mg by mouth. Add 0.5 mg with 3 mg =3.5mg   On Wednesday and Friday  . warfarin (COUMADIN) 3 MG tablet Take 3 mg by mouth daily. On Sun, Mon, Tues, Thurs, and Sat   No facility-administered encounter medications on file as  of 09/18/2018.    ROS was provided with assistance of staff Review of Systems  Constitutional: Negative for activity change, appetite change, chills and diaphoresis.  HENT: Positive for hearing loss. Negative for congestion and voice change.   Respiratory: Negative for cough, shortness of breath and wheezing.   Cardiovascular: Negative for chest pain, palpitations and leg swelling.  Gastrointestinal: Negative for abdominal distention, abdominal pain and constipation.  Genitourinary: Negative for difficulty urinating, dysuria and urgency.  Musculoskeletal: Positive for arthralgias and gait  problem.  Neurological: Positive for tremors. Negative for dizziness, speech difficulty and headaches.       Dementia  Psychiatric/Behavioral: Positive for confusion. Negative for agitation, behavioral problems, hallucinations and sleep disturbance. The patient is not nervous/anxious.     Immunization History  Administered Date(s) Administered  . Influenza Whole 05/09/2018  . Influenza-Unspecified 05/28/2017  . Pneumococcal Conjugate-13 07/13/2017  . Pneumococcal Polysaccharide-23 04/07/2002  . Tdap 07/13/2017, 10/08/2017   Pertinent  Health Maintenance Due  Topic Date Due  . INFLUENZA VACCINE  Completed  . DEXA SCAN  Completed  . PNA vac Low Risk Adult  Completed   Fall Risk  10/05/2017  Falls in the past year? No   Functional Status Survey:    Vitals:   09/18/18 1432  BP: 130/70  Pulse: 68  Resp: 20  Temp: 98.6 F (37 C)  SpO2: 95%  Weight: 125 lb 1.6 oz (56.7 kg)  Height: 5\' 4"  (1.626 m)   Body mass index is 21.47 kg/m. Physical Exam Constitutional:      General: She is not in acute distress.    Appearance: Normal appearance. She is not ill-appearing, toxic-appearing or diaphoretic.  HENT:     Head: Normocephalic and atraumatic.     Nose: Nose normal.     Mouth/Throat:     Mouth: Mucous membranes are moist.  Eyes:     Pupils: Pupils are equal, round, and reactive to light.  Neck:     Musculoskeletal: Normal range of motion and neck supple.  Cardiovascular:     Rate and Rhythm: Normal rate. Rhythm irregular.     Heart sounds: No murmur.  Pulmonary:     Effort: Pulmonary effort is normal.     Breath sounds: No wheezing, rhonchi or rales.  Abdominal:     General: There is no distension.     Palpations: Abdomen is soft.     Tenderness: There is no abdominal tenderness. There is no guarding or rebound.  Musculoskeletal:     Right lower leg: No edema.     Left lower leg: No edema.     Comments: Ambulates with walker.   Skin:    General: Skin is warm and  dry.     Findings: Bruising present.     Comments: A large dark purple bruise from the left hip to the left knee laterally.   Neurological:     General: No focal deficit present.     Mental Status: She is alert. Mental status is at baseline.     Cranial Nerves: No cranial nerve deficit.     Motor: No weakness.     Coordination: Coordination normal.     Gait: Gait abnormal.     Comments: Oriented to person and place. Fine resting tremor in fingers.   Psychiatric:        Mood and Affect: Mood normal.        Behavior: Behavior normal.     Labs reviewed: Recent Labs    12/11/17  7121  01/29/18  04/20/18 1337 04/25/18 07/18/18  NA 139   < > 141   < > 144 135* 140  K 3.2*   < > 3.7   < > 3.3* 3.4 3.6  CL 95*   < > 100   < > 97* 93 98  CO2 35*   < > 33   < > 35* 35 33  GLUCOSE 119*  --   --   --  121*  --   --   BUN 30*   < > 33*   < > 33* 29* 28*  CREATININE 1.23*   < > 1.2*   < > 1.24* 1.2* 1.1  CALCIUM 10.0   < > 9.7   < > 10.6* 8.9 9.9  MG  --   --  1.7  --   --   --   --    < > = values in this interval not displayed.   Recent Labs    04/17/18 04/20/18 1337 04/25/18  AST 17 24 14   ALT 9 16 8   ALKPHOS 37 47 40  BILITOT  --  0.7  --   PROT 5.6 7.9 5.5  ALBUMIN 3.3 4.1 3.2   Recent Labs    12/05/17  12/11/17 0625  04/20/18 1337 04/25/18 05/01/18  WBC 8.8   < > 10.1   < > 12.8* 11.5 8.9  NEUTROABS 4,928  --  7.4  --  10.7*  --   --   HGB 12.4  --  14.2   < > 15.5* 11.9* 12.9  HCT 36  --  42.0   < > 46.7* 34* 38  MCV  --   --  90.3  --  91.2  --   --   PLT 188  --  247   < > 269 221 275   < > = values in this interval not displayed.   Lab Results  Component Value Date   TSH 0.90 07/12/2017   No results found for: HGBA1C No results found for: CHOL, HDL, LDLCALC, LDLDIRECT, TRIG, CHOLHDL  Significant Diagnostic Results in last 30 days:  No results found.  Assessment/Plan Contusion a large left lateral leg bruise from left hip to above the left knee. Will  observe. Update CBC PT/INR.   Mixed Alzheimer's and vascular dementia Continue SNF [facility] for safety and care assistance.   Chronic anticoagulation For Afib, update PT/INR in setting of contusion/bruise of the lateral left thigh.      Family/ staff Communication: plan of care reviewed with the patient and charge nurse.   Labs/tests ordered:  CBC PT/INR  Time spend 25 minutes

## 2018-09-18 NOTE — Assessment & Plan Note (Signed)
Continue [facility] for safety and care assistance.  

## 2018-09-18 NOTE — Assessment & Plan Note (Signed)
a large left lateral leg bruise from left hip to above the left knee. Will observe. Update CBC PT/INR.

## 2018-09-18 NOTE — Assessment & Plan Note (Signed)
For Afib, update PT/INR in setting of contusion/bruise of the lateral left thigh.

## 2018-09-19 DIAGNOSIS — G250 Essential tremor: Secondary | ICD-10-CM | POA: Diagnosis not present

## 2018-09-19 DIAGNOSIS — M6281 Muscle weakness (generalized): Secondary | ICD-10-CM | POA: Diagnosis not present

## 2018-09-19 DIAGNOSIS — Z9181 History of falling: Secondary | ICD-10-CM | POA: Diagnosis not present

## 2018-09-19 DIAGNOSIS — I4891 Unspecified atrial fibrillation: Secondary | ICD-10-CM | POA: Diagnosis not present

## 2018-09-19 DIAGNOSIS — R6889 Other general symptoms and signs: Secondary | ICD-10-CM | POA: Diagnosis not present

## 2018-09-19 DIAGNOSIS — R2681 Unsteadiness on feet: Secondary | ICD-10-CM | POA: Diagnosis not present

## 2018-09-19 DIAGNOSIS — N3946 Mixed incontinence: Secondary | ICD-10-CM | POA: Diagnosis not present

## 2018-09-19 DIAGNOSIS — G309 Alzheimer's disease, unspecified: Secondary | ICD-10-CM | POA: Diagnosis not present

## 2018-09-19 LAB — CBC AND DIFFERENTIAL
HCT: 31 — AB (ref 36–46)
Hemoglobin: 10.4 — AB (ref 12.0–16.0)
Platelets: 232 (ref 150–399)
WBC: 7.7

## 2018-09-19 LAB — PROTIME-INR: Protime: 21.4 — AB (ref 10.0–13.8)

## 2018-09-19 LAB — POCT INR: INR: 2.2 — AB (ref <=1.1)

## 2018-09-20 DIAGNOSIS — G250 Essential tremor: Secondary | ICD-10-CM | POA: Diagnosis not present

## 2018-09-20 DIAGNOSIS — N3946 Mixed incontinence: Secondary | ICD-10-CM | POA: Diagnosis not present

## 2018-09-20 DIAGNOSIS — M6281 Muscle weakness (generalized): Secondary | ICD-10-CM | POA: Diagnosis not present

## 2018-09-20 DIAGNOSIS — G309 Alzheimer's disease, unspecified: Secondary | ICD-10-CM | POA: Diagnosis not present

## 2018-09-20 DIAGNOSIS — R2681 Unsteadiness on feet: Secondary | ICD-10-CM | POA: Diagnosis not present

## 2018-09-20 DIAGNOSIS — Z9181 History of falling: Secondary | ICD-10-CM | POA: Diagnosis not present

## 2018-09-24 DIAGNOSIS — G309 Alzheimer's disease, unspecified: Secondary | ICD-10-CM | POA: Diagnosis not present

## 2018-09-24 DIAGNOSIS — M6281 Muscle weakness (generalized): Secondary | ICD-10-CM | POA: Diagnosis not present

## 2018-09-24 DIAGNOSIS — G250 Essential tremor: Secondary | ICD-10-CM | POA: Diagnosis not present

## 2018-09-24 DIAGNOSIS — N3946 Mixed incontinence: Secondary | ICD-10-CM | POA: Diagnosis not present

## 2018-09-24 DIAGNOSIS — Z9181 History of falling: Secondary | ICD-10-CM | POA: Diagnosis not present

## 2018-09-24 DIAGNOSIS — R2681 Unsteadiness on feet: Secondary | ICD-10-CM | POA: Diagnosis not present

## 2018-09-25 DIAGNOSIS — M6281 Muscle weakness (generalized): Secondary | ICD-10-CM | POA: Diagnosis not present

## 2018-09-25 DIAGNOSIS — G309 Alzheimer's disease, unspecified: Secondary | ICD-10-CM | POA: Diagnosis not present

## 2018-09-25 DIAGNOSIS — R2681 Unsteadiness on feet: Secondary | ICD-10-CM | POA: Diagnosis not present

## 2018-09-25 DIAGNOSIS — G250 Essential tremor: Secondary | ICD-10-CM | POA: Diagnosis not present

## 2018-09-25 DIAGNOSIS — Z9181 History of falling: Secondary | ICD-10-CM | POA: Diagnosis not present

## 2018-09-25 DIAGNOSIS — N3946 Mixed incontinence: Secondary | ICD-10-CM | POA: Diagnosis not present

## 2018-09-26 ENCOUNTER — Non-Acute Institutional Stay (SKILLED_NURSING_FACILITY): Payer: Medicare Other | Admitting: Nurse Practitioner

## 2018-09-26 ENCOUNTER — Encounter: Payer: Self-pay | Admitting: Nurse Practitioner

## 2018-09-26 DIAGNOSIS — F0150 Vascular dementia without behavioral disturbance: Secondary | ICD-10-CM

## 2018-09-26 DIAGNOSIS — I482 Chronic atrial fibrillation, unspecified: Secondary | ICD-10-CM

## 2018-09-26 DIAGNOSIS — R269 Unspecified abnormalities of gait and mobility: Secondary | ICD-10-CM

## 2018-09-26 DIAGNOSIS — R2681 Unsteadiness on feet: Secondary | ICD-10-CM | POA: Diagnosis not present

## 2018-09-26 DIAGNOSIS — G309 Alzheimer's disease, unspecified: Secondary | ICD-10-CM

## 2018-09-26 DIAGNOSIS — F0280 Dementia in other diseases classified elsewhere without behavioral disturbance: Secondary | ICD-10-CM

## 2018-09-26 DIAGNOSIS — Z7901 Long term (current) use of anticoagulants: Secondary | ICD-10-CM

## 2018-09-26 DIAGNOSIS — N3946 Mixed incontinence: Secondary | ICD-10-CM | POA: Diagnosis not present

## 2018-09-26 DIAGNOSIS — G250 Essential tremor: Secondary | ICD-10-CM | POA: Diagnosis not present

## 2018-09-26 DIAGNOSIS — Z9181 History of falling: Secondary | ICD-10-CM | POA: Diagnosis not present

## 2018-09-26 DIAGNOSIS — M6281 Muscle weakness (generalized): Secondary | ICD-10-CM | POA: Diagnosis not present

## 2018-09-26 NOTE — Progress Notes (Signed)
Location:  [facility] Room Number: 31 Place of Service:  SNF (31) Provider:  Chipper OmanMast, ManXie  NP  Mahlon GammonGupta, Anjali L, MD  Patient Care Team: Mahlon GammonGupta, Anjali L, MD as PCP - General (Internal Medicine) Nahser, Deloris PingPhilip J, MD as PCP - Cardiology (Cardiology) Charmelle Soh X, NP as Nurse Practitioner (Internal Medicine)  Extended Emergency Contact Information Primary Emergency Contact: Morgan,Tom Address: [address]          [place], [place] [address] [place] Home Phone: [phone] Relation: Friend Secondary Emergency Contact: Fletcher,Christa  [place] Home Phone: [phone] Relation: Niece  Code Status:  DNR Goals of care: Advanced Directive information Advanced Directives 09/13/2018  Does Patient Have a Medical Advance Directive? Yes  Type of Estate agentAdvance Directive Healthcare Power of [place];Out of facility DNR (pink MOST or yellow form)  Does patient want to make changes to medical advance directive? No - Patient declined  Copy of Healthcare Power of Attorney in Chart? -  Would patient like information on creating a medical advance directive? -  Pre-existing out of facility DNR order (yellow form or pink MOST form) Yellow form placed in chart (order not valid for inpatient use)     Chief Complaint  Patient presents with  . Acute Visit    anticoagulation therapy     HPI:  Pt is a 85 y.o. female seen today for an acute visit for Hx of Afib, heart rate is in control, taking Coumadin for thromboembolic risk reduction. She refuses PT/INR checks frequently. HPI was provided with assistance of staff and the patient's daughter. [facility] care [facility]. She has poor safety awareness and gait abnormality, she is risk for falling, HPOA is aware of the Rvs B of anticoagulation therapy.    Past Medical History:  Diagnosis Date  . Anticoagulant long-term use   . Atrial fibrillation (HCC)   . CHF  (congestive heart failure) (HCC)   . Chronic anticoagulation   . Hypertension   . Raynaud phenomenon   . Tremor, essential    Past Surgical History:  Procedure Laterality Date  . CHOLECYSTECTOMY    . TONSILLECTOMY      Allergies  Allergen Reactions  . Fosamax [Alendronate]   . Other Nausea And Vomiting    Green pepper    Outpatient Encounter Medications as of 09/26/2018  Medication Sig  . acetaminophen (TYLENOL) 500 MG tablet Take 500 mg by mouth every 8 (eight) hours as needed for mild pain or moderate pain. Take 1000 mg twice daily.  Marland Kitchen. acetaminophen (TYLENOL) 500 MG tablet Take 1,000 mg by mouth 2 (two) times daily.  . Calcium Carbonate-Vit D-Min (CALTRATE 600+D PLUS MINERALS) 600-800 MG-UNIT TABS Take 1 tablet by mouth 2 (two) times daily.   . chlorthalidone (HYGROTON) 25 MG tablet Take 25 mg by mouth daily.   . hydroxypropyl methylcellulose / hypromellose (ISOPTO TEARS / GONIOVISC) 2.5 % ophthalmic solution Place 1 drop into both eyes 3 (three) times daily.  . potassium chloride (K-DUR) 10 MEQ tablet Take 20 mEq by mouth daily.  . propranolol ER (INDERAL LA) 80 MG 24 hr capsule TAKE 1 CAPSULE BY MOUTH  DAILY  . sertraline (ZOLOFT) 25 MG tablet Take 25 mg by mouth daily.  Marland Kitchen. warfarin (COUMADIN) 0.5 mg TABS tablet Take 0.5 mg by mouth. Add 0.5 mg with 3 mg =3.5mg   On Wednesday and Friday  . warfarin (COUMADIN) 3 MG tablet Take 3 mg by  mouth daily. On Sun, Mon, Tues, Thurs, and Sat   No facility-administered encounter medications on file as of 09/26/2018.    ROS was provided with assistance of staff and family.  Review of Systems  Constitutional: Negative for activity change, appetite change, chills, diaphoresis, fatigue and fever.  HENT: Positive for hearing loss. Negative for congestion and voice change.   Respiratory: Negative for cough, shortness of breath and wheezing.   Cardiovascular: Positive for leg swelling. Negative for chest pain and palpitations.  Gastrointestinal:  Negative for abdominal distention, abdominal pain, anal bleeding, blood in stool, constipation, diarrhea, nausea and vomiting.  Genitourinary: Negative for difficulty urinating, dysuria and urgency.  Musculoskeletal: Positive for arthralgias and gait problem.  Skin: Negative for color change and pallor.  Neurological: Positive for tremors. Negative for dizziness, speech difficulty and headaches.       Dementia  Hematological: Bruises/bleeds easily.  Psychiatric/Behavioral: Positive for confusion. Negative for agitation, behavioral problems, hallucinations and sleep disturbance. The patient is not nervous/anxious.     Immunization History  Administered Date(s) Administered  . Influenza Whole 05/09/2018  . Influenza-Unspecified 05/28/2017  . Pneumococcal Conjugate-13 07/13/2017  . Pneumococcal Polysaccharide-23 04/07/2002  . Tdap 07/13/2017, 10/08/2017   Pertinent  Health Maintenance Due  Topic Date Due  . INFLUENZA VACCINE  Completed  . DEXA SCAN  Completed  . PNA vac Low Risk Adult  Completed   Fall Risk  10/05/2017  Falls in the past year? No   Functional Status Survey:    Vitals:   09/26/18 1033  BP: 126/70  Pulse: 88  Resp: 20  Temp: 98.6 F (37 C)  SpO2: 95%  Weight: 122 lb (55.3 kg)  Height: 5\' 4"  (1.626 m)   Body mass index is 20.94 kg/m. Physical Exam Constitutional:      General: She is not in acute distress.    Appearance: Normal appearance. She is not ill-appearing, toxic-appearing or diaphoretic.  HENT:     Head: Normocephalic and atraumatic.     Nose: Nose normal.     Mouth/Throat:     Mouth: Mucous membranes are moist.  Eyes:     Extraocular Movements: Extraocular movements intact.     Pupils: Pupils are equal, round, and reactive to light.  Cardiovascular:     Rate and Rhythm: Normal rate. Rhythm irregular.     Heart sounds: No murmur.  Pulmonary:     Effort: Pulmonary effort is normal.     Breath sounds: No wheezing or rales.  Abdominal:      General: There is no distension.     Palpations: Abdomen is soft.     Tenderness: There is no abdominal tenderness. There is no guarding or rebound.  Musculoskeletal:     Right lower leg: Edema present.     Left lower leg: Edema present.     Comments: Chronic trace edema BLE. Ambulates with walker.   Skin:    General: Skin is warm.  Neurological:     General: No focal deficit present.     Mental Status: She is alert. Mental status is at baseline.     Cranial Nerves: No cranial nerve deficit.     Motor: No weakness.     Coordination: Coordination normal.     Gait: Gait abnormal.     Comments: Oriented to person and her room on unit.   Psychiatric:        Mood and Affect: Mood normal.        Behavior: Behavior normal.  Labs reviewed: Recent Labs    12/11/17 0807  01/29/18  04/20/18 1337 04/25/18 07/18/18  NA 139   < > 141   < > 144 135* 140  K 3.2*   < > 3.7   < > 3.3* 3.4 3.6  CL 95*   < > 100   < > 97* 93 98  CO2 35*   < > 33   < > 35* 35 33  GLUCOSE 119*  --   --   --  121*  --   --   BUN 30*   < > 33*   < > 33* 29* 28*  CREATININE 1.23*   < > 1.2*   < > 1.24* 1.2* 1.1  CALCIUM 10.0   < > 9.7   < > 10.6* 8.9 9.9  MG  --   --  1.7  --   --   --   --    < > = values in this interval not displayed.   Recent Labs    04/17/18 04/20/18 1337 04/25/18  AST 17 24 14   ALT 9 16 8   ALKPHOS 37 47 40  BILITOT  --  0.7  --   PROT 5.6 7.9 5.5  ALBUMIN 3.3 4.1 3.2   Recent Labs    12/05/17  12/11/17 0625  04/20/18 1337 04/25/18 05/01/18 09/19/18  WBC 8.8   < > 10.1   < > 12.8* 11.5 8.9 7.7  NEUTROABS 4,928  --  7.4  --  10.7*  --   --   --   HGB 12.4  --  14.2   < > 15.5* 11.9* 12.9 10.4*  HCT 36  --  42.0   < > 46.7* 34* 38 31*  MCV  --   --  90.3  --  91.2  --   --   --   PLT 188  --  247   < > 269 221 275 232   < > = values in this interval not displayed.   Lab Results  Component Value Date   TSH 0.90 07/12/2017   No results found for: HGBA1C No results found  for: CHOL, HDL, LDLCALC, LDLDIRECT, TRIG, CHOLHDL  Significant Diagnostic Results in last 30 days:  No results found.  Assessment/Plan Chronic anticoagulation For Afib, the patient refuses PT/INR check frequently, HPOA consented to switch from Coumadin to Eliquis, will dc Coumadin, starts Eliquis 2.5mg  po bid(for age>80 and wt<60Kg) when INR <2, PT/INR today. She has poor safety awareness and gait abnormality, she is risk for falling, HPOA is aware of the Rvs B of anticoagulation therapy.  Chronic a-fib (HCC) Heart rate is in control.   Mixed Alzheimer's and vascular dementia Continue SNF [facility] for safety and care assistance.   Gait abnormality Continue to ambulate with walker     Family/ staff Communication: plan of care reviewed with the patient, the patient's daughter-HPOA, and charge nurse.   Labs/tests ordered: PT/INR   Time spend 25 minutes.

## 2018-09-26 NOTE — Assessment & Plan Note (Signed)
Continue to ambulate with walker.  

## 2018-09-26 NOTE — Assessment & Plan Note (Signed)
Heart rate is in control.  

## 2018-09-26 NOTE — Assessment & Plan Note (Signed)
Continue [facility] for safety and care assistance.  

## 2018-09-26 NOTE — Assessment & Plan Note (Addendum)
For Afib, the patient refuses PT/INR check frequently, HPOA consented to switch from Coumadin to Eliquis, will dc Coumadin, starts Eliquis 2.5mg  po bid(for age>80 and wt<60Kg) when INR <2, PT/INR today. She has poor safety awareness and gait abnormality, she is risk for falling, HPOA is aware of the Rvs B of anticoagulation therapy.

## 2018-09-27 ENCOUNTER — Encounter: Payer: Self-pay | Admitting: Nurse Practitioner

## 2018-09-27 DIAGNOSIS — N3946 Mixed incontinence: Secondary | ICD-10-CM | POA: Diagnosis not present

## 2018-09-27 DIAGNOSIS — M6281 Muscle weakness (generalized): Secondary | ICD-10-CM | POA: Diagnosis not present

## 2018-09-27 DIAGNOSIS — G250 Essential tremor: Secondary | ICD-10-CM | POA: Diagnosis not present

## 2018-09-27 DIAGNOSIS — Z9181 History of falling: Secondary | ICD-10-CM | POA: Diagnosis not present

## 2018-09-27 DIAGNOSIS — G309 Alzheimer's disease, unspecified: Secondary | ICD-10-CM | POA: Diagnosis not present

## 2018-09-27 DIAGNOSIS — R2681 Unsteadiness on feet: Secondary | ICD-10-CM | POA: Diagnosis not present

## 2018-09-30 DIAGNOSIS — R2681 Unsteadiness on feet: Secondary | ICD-10-CM | POA: Diagnosis not present

## 2018-09-30 DIAGNOSIS — N3946 Mixed incontinence: Secondary | ICD-10-CM | POA: Diagnosis not present

## 2018-09-30 DIAGNOSIS — M6281 Muscle weakness (generalized): Secondary | ICD-10-CM | POA: Diagnosis not present

## 2018-09-30 DIAGNOSIS — G309 Alzheimer's disease, unspecified: Secondary | ICD-10-CM | POA: Diagnosis not present

## 2018-09-30 DIAGNOSIS — Z9181 History of falling: Secondary | ICD-10-CM | POA: Diagnosis not present

## 2018-09-30 DIAGNOSIS — G250 Essential tremor: Secondary | ICD-10-CM | POA: Diagnosis not present

## 2018-10-01 DIAGNOSIS — I4891 Unspecified atrial fibrillation: Secondary | ICD-10-CM | POA: Diagnosis not present

## 2018-10-01 DIAGNOSIS — G250 Essential tremor: Secondary | ICD-10-CM | POA: Diagnosis not present

## 2018-10-01 DIAGNOSIS — R2681 Unsteadiness on feet: Secondary | ICD-10-CM | POA: Diagnosis not present

## 2018-10-01 DIAGNOSIS — Z9181 History of falling: Secondary | ICD-10-CM | POA: Diagnosis not present

## 2018-10-01 DIAGNOSIS — M6281 Muscle weakness (generalized): Secondary | ICD-10-CM | POA: Diagnosis not present

## 2018-10-01 DIAGNOSIS — N3946 Mixed incontinence: Secondary | ICD-10-CM | POA: Diagnosis not present

## 2018-10-01 DIAGNOSIS — G309 Alzheimer's disease, unspecified: Secondary | ICD-10-CM | POA: Diagnosis not present

## 2018-10-01 LAB — CBC AND DIFFERENTIAL
HCT: 34 — AB (ref 36–46)
Hemoglobin: 11.5 — AB (ref 12.0–16.0)
Platelets: 206 (ref 150–399)
WBC: 7.5

## 2018-10-02 DIAGNOSIS — Z9181 History of falling: Secondary | ICD-10-CM | POA: Diagnosis not present

## 2018-10-02 DIAGNOSIS — M6281 Muscle weakness (generalized): Secondary | ICD-10-CM | POA: Diagnosis not present

## 2018-10-02 DIAGNOSIS — G309 Alzheimer's disease, unspecified: Secondary | ICD-10-CM | POA: Diagnosis not present

## 2018-10-02 DIAGNOSIS — N3946 Mixed incontinence: Secondary | ICD-10-CM | POA: Diagnosis not present

## 2018-10-02 DIAGNOSIS — R2681 Unsteadiness on feet: Secondary | ICD-10-CM | POA: Diagnosis not present

## 2018-10-02 DIAGNOSIS — G250 Essential tremor: Secondary | ICD-10-CM | POA: Diagnosis not present

## 2018-10-02 IMAGING — CT CT CERVICAL SPINE W/O CM
2 of 13 series · 5 of 33 positions shown, 6 images · non-contrast
Comparison: 07/04/2016 head and cervical spine CT.

CLINICAL DATA: [AGE] female presents with un witnessed fall
and increased confusion over the past 3 days. Abrasion nose and
left-side of face. Baseline dementia. On Coumadin. Initial
encounter.

EXAM:
CT HEAD WITHOUT CONTRAST
CT MAXILLOFACIAL WITHOUT CONTRAST
CT CERVICAL SPINE WITHOUT CONTRAST
TECHNIQUE: Multidetector CT imaging of the head, cervical spine, and
maxillofacial structures were performed using the standard protocol
without intravenous contrast. Multiplanar CT image reconstructions
of the cervical spine and maxillofacial structures were also
generated.

[Series 9: maxilllofacial 2.0 hr40 3 · axial · 0.35mm/px · z∈[+1110,+1252]mm · 3 of 72 slices shown, 4 images]
[im 1/72  soft-tissue]
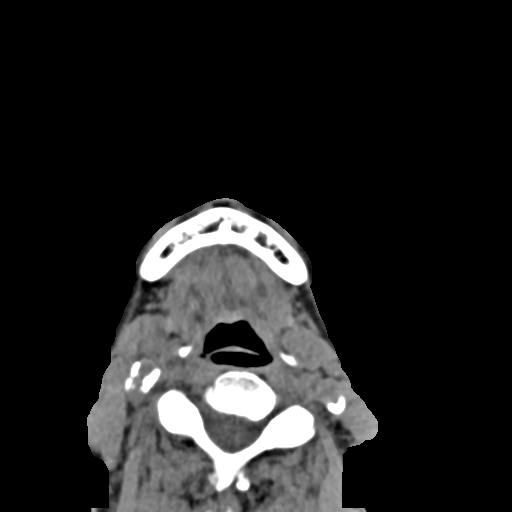
[im 1/72  bone]
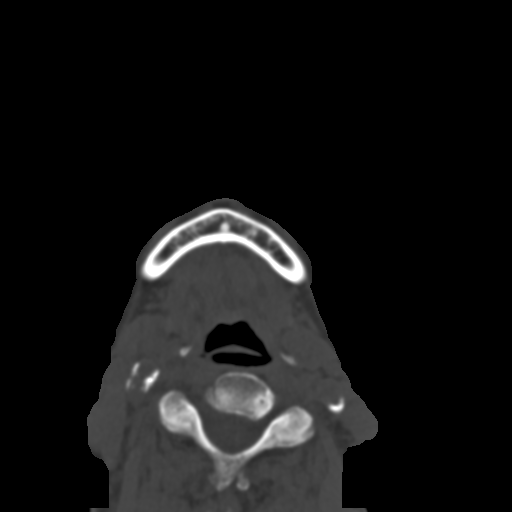
[im 36/72  bone]
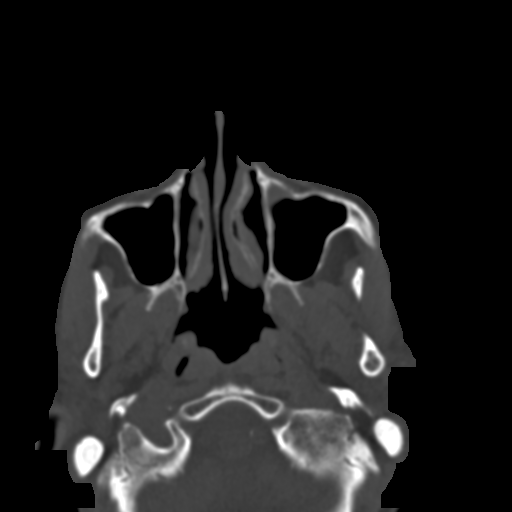
[im 72/72  bone]
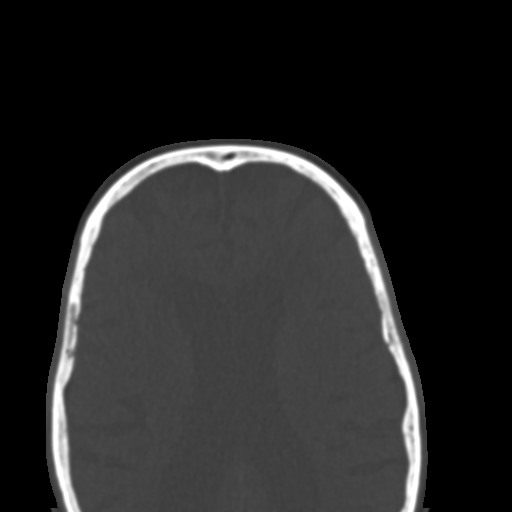

[Series 14: st sag · sagittal · 0.28mm/px · 2 of 76 slices shown]
[im 26/76  bone]
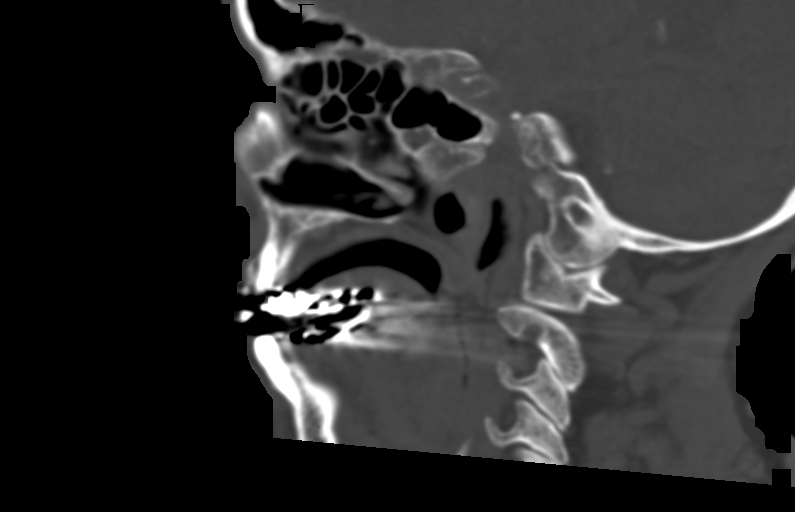
[im 51/76  bone]
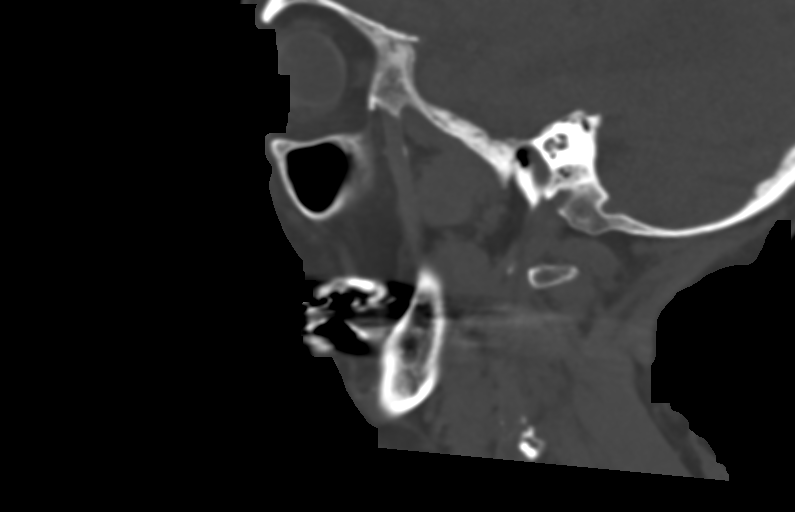

[5 of 33 positions shown; findings below may reference images not displayed]

FINDINGS: CT HEAD FINDINGS

Brain: No intracranial hemorrhage or CT evidence of large acute
infarct.

Prominent chronic microvascular changes.

Global atrophy.

No intracranial mass lesion noted on this unenhanced exam.

Vascular: Vascular calcifications

Skull: No skull fracture

Other: No acute orbital abnormality.

CT MAXILLOFACIAL FINDINGS

Osseous: Inferior aspect of the chin is not included on present
exam. No facial fracture otherwise noted.

Orbits: No acute orbital abnormality.

Sinuses: Minimal mucosal thickening left maxillary sinus.

Soft tissues: Negative

CT CERVICAL SPINE FINDINGS

Alignment: Curvature convex left unchanged from prior exam.

Skull base and vertebrae: No cervical spine fracture noted.

Soft tissues and spinal canal: No abnormal prevertebral soft tissue
swelling or high-grade spinal stenosis.

Disc levels:  Mild degenerative changes most notable C2-3.

Upper chest: No worrisome lung apical lesion

Other: Carotid bifurcation calcifications.
IMPRESSION: No skull fracture or intracranial hemorrhage.

Inferior aspect of the chin is not included on present exam. No
facial fracture otherwise noted.

Curvature cervical spine convex left unchanged. No cervical spine
fracture or abnormal prevertebral soft tissue swelling.

Chronic changes otherwise as noted above.

## 2018-10-07 DIAGNOSIS — M6281 Muscle weakness (generalized): Secondary | ICD-10-CM | POA: Diagnosis not present

## 2018-10-07 DIAGNOSIS — Z9181 History of falling: Secondary | ICD-10-CM | POA: Diagnosis not present

## 2018-10-07 DIAGNOSIS — R278 Other lack of coordination: Secondary | ICD-10-CM | POA: Diagnosis not present

## 2018-10-07 DIAGNOSIS — N3946 Mixed incontinence: Secondary | ICD-10-CM | POA: Diagnosis not present

## 2018-10-08 DIAGNOSIS — M6281 Muscle weakness (generalized): Secondary | ICD-10-CM | POA: Diagnosis not present

## 2018-10-08 DIAGNOSIS — Z9181 History of falling: Secondary | ICD-10-CM | POA: Diagnosis not present

## 2018-10-08 DIAGNOSIS — N3946 Mixed incontinence: Secondary | ICD-10-CM | POA: Diagnosis not present

## 2018-10-08 DIAGNOSIS — R278 Other lack of coordination: Secondary | ICD-10-CM | POA: Diagnosis not present

## 2018-10-10 ENCOUNTER — Non-Acute Institutional Stay (SKILLED_NURSING_FACILITY): Payer: Medicare Other | Admitting: Nurse Practitioner

## 2018-10-10 ENCOUNTER — Encounter: Payer: Self-pay | Admitting: Nurse Practitioner

## 2018-10-10 DIAGNOSIS — S22080A Wedge compression fracture of T11-T12 vertebra, initial encounter for closed fracture: Secondary | ICD-10-CM

## 2018-10-10 DIAGNOSIS — F0150 Vascular dementia without behavioral disturbance: Secondary | ICD-10-CM

## 2018-10-10 DIAGNOSIS — F418 Other specified anxiety disorders: Secondary | ICD-10-CM

## 2018-10-10 DIAGNOSIS — R269 Unspecified abnormalities of gait and mobility: Secondary | ICD-10-CM

## 2018-10-10 DIAGNOSIS — R278 Other lack of coordination: Secondary | ICD-10-CM | POA: Diagnosis not present

## 2018-10-10 DIAGNOSIS — G309 Alzheimer's disease, unspecified: Secondary | ICD-10-CM

## 2018-10-10 DIAGNOSIS — Z9181 History of falling: Secondary | ICD-10-CM | POA: Diagnosis not present

## 2018-10-10 DIAGNOSIS — I482 Chronic atrial fibrillation, unspecified: Secondary | ICD-10-CM

## 2018-10-10 DIAGNOSIS — G250 Essential tremor: Secondary | ICD-10-CM | POA: Diagnosis not present

## 2018-10-10 DIAGNOSIS — N3946 Mixed incontinence: Secondary | ICD-10-CM | POA: Diagnosis not present

## 2018-10-10 DIAGNOSIS — F0280 Dementia in other diseases classified elsewhere without behavioral disturbance: Secondary | ICD-10-CM

## 2018-10-10 DIAGNOSIS — R609 Edema, unspecified: Secondary | ICD-10-CM

## 2018-10-10 DIAGNOSIS — M6281 Muscle weakness (generalized): Secondary | ICD-10-CM | POA: Diagnosis not present

## 2018-10-10 NOTE — Assessment & Plan Note (Signed)
Heart rate is in control, continue Propranolol, Eliquis.

## 2018-10-10 NOTE — Progress Notes (Signed)
Location:  [facility] Room Number: 31A Place of Service:  SNF (31) Provider:  Man Johnney Ou, NP  Mahlon Gammon, MD  Patient Care Team: Mahlon Gammon, MD as PCP - General (Internal Medicine) Nahser, Deloris Ping, MD as PCP - Cardiology (Cardiology) Mast, Man X, NP as Nurse Practitioner (Internal Medicine)  Extended Emergency Contact Information Primary Emergency Contact: Morgan,Tom Address: [address]          [place], [place] [address] Darden [place] of [place] Home Phone: [phone] Relation: Friend Secondary Emergency Contact: Brandy [place] of [place] Home Phone: [phone] Relation: Niece  Code Status:  DNR Goals of care: Advanced Directive information Advanced Directives 10/10/2018  Does Patient Have a Medical Advance Directive? Yes  Type of Estate agent of [place];Out of facility DNR (pink MOST or yellow form)  Does patient want to make changes to medical advance directive? No - Patient declined  Copy of Healthcare Power of Attorney in Chart? Yes - validated most recent copy scanned in chart (See row information)  Would patient like information on creating a medical advance directive? -  Pre-existing out of facility DNR order (yellow form or pink MOST form) Yellow form placed in chart (order not valid for inpatient use)     Chief Complaint  Patient presents with  . Medical Management of Chronic Issues    Routine Visit    HPI:  Pt is a 85 y.o. female seen today for medical management of chronic diseases.    The patient resides in SNF [facility] for safety and care assistance, her mood is stabilized on Sertraline  qd. Afib, heart rate is in control, on Propranolol  qd in setting of essential tremor which is mild/not disabling in hands, on Eliquis 2.5mg  bid. BLE edema, minimal, on HCTZ  qd. Lower back pain is stable on Tylenol  bid.    Past Medical History:  Diagnosis Date  .  Anticoagulant long-term use   . Atrial fibrillation (HCC)   . CHF (congestive heart failure) (HCC)   . Chronic anticoagulation   . Hypertension   . Raynaud phenomenon   . Tremor, essential    Past Surgical History:  Procedure Laterality Date  . CHOLECYSTECTOMY    . TONSILLECTOMY      Allergies  Allergen Reactions  . Fosamax [Alendronate]   . Other Nausea And Vomiting    Green pepper    Outpatient Encounter Medications as of 10/10/2018  Medication Sig  . acetaminophen (TYLENOL) 500 MG tablet Take 500 mg by mouth every 8 (eight) hours as needed for mild pain or moderate pain. Take 1000 mg twice daily.  Marland Kitchen acetaminophen (TYLENOL) 500 MG tablet Take 1,000 mg by mouth 2 (two) times daily.  Marland Kitchen apixaban (ELIQUIS) 2.5 MG TABS tablet Take 2.5 mg by mouth 2 (two) times daily.  . Calcium Carbonate-Vit D-Min (CALTRATE 600+D PLUS MINERALS) 600-800 MG-UNIT TABS Take 1 tablet by mouth 2 (two) times daily.   . chlorthalidone (HYGROTON) 25 MG tablet Take 25 mg by mouth daily.   . hydroxypropyl methylcellulose / hypromellose (ISOPTO TEARS / GONIOVISC) 2.5 % ophthalmic solution Place 1 drop into both eyes 3 (three) times daily.  . potassium chloride (K-DUR) 10 MEQ tablet Take 20 mEq by mouth daily.  . propranolol ER (INDERAL LA) 80 MG 24 hr capsule TAKE 1 CAPSULE BY MOUTH  DAILY  . sertraline (ZOLOFT) 25 MG tablet Take 25 mg by mouth daily.  . [DISCONTINUED] warfarin (COUMADIN)  0.5 mg TABS tablet Take 0.5 mg by mouth. Add 0.5 mg with 3 mg =3.5mg   On Wednesday and Friday  . [DISCONTINUED] warfarin (COUMADIN) 3 MG tablet Take 3 mg by mouth daily. On Sun, Mon, Tues, Thurs, and Sat   No facility-administered encounter medications on file as of 10/10/2018.    ROS was provided with assistance of staff Review of Systems  Constitutional: Positive for unexpected weight change. Negative for activity change, appetite change, chills, diaphoresis, fatigue and fever.       #3 Ibs weight loss in past month  HENT:  Positive for hearing loss. Negative for congestion and voice change.   Respiratory: Negative for cough, shortness of breath and wheezing.   Cardiovascular: Positive for leg swelling. Negative for chest pain and palpitations.  Gastrointestinal: Negative for abdominal distention, abdominal pain, constipation, diarrhea, nausea and vomiting.  Genitourinary: Negative for difficulty urinating, dysuria and urgency.  Musculoskeletal: Positive for arthralgias, back pain and gait problem.  Skin: Negative for color change and pallor.  Neurological: Negative for dizziness, speech difficulty, weakness and headaches.       Dementia  Psychiatric/Behavioral: Positive for behavioral problems and confusion. Negative for agitation, hallucinations and sleep disturbance. The patient is not nervous/anxious.     Immunization History  Administered Date(s) Administered  . Influenza Whole 05/09/2018  . Influenza-Unspecified 05/28/2017  . Pneumococcal Conjugate-13 07/13/2017  . Pneumococcal Polysaccharide-23 04/07/2002  . Tdap 07/13/2017, 10/08/2017   Pertinent  Health Maintenance Due  Topic Date Due  . INFLUENZA VACCINE  Completed  . DEXA SCAN  Completed  . PNA vac Low Risk Adult  Completed   Fall Risk  10/05/2017  Falls in the past year? No   Functional Status Survey:    Vitals:   10/10/18 0835  BP: 122/68  Pulse: 78  Resp: 16  Temp: (!) 97.3 F (36.3 C)  TempSrc: Oral  SpO2: 95%  Weight: 119 lb 4.8 oz (54.1 kg)  Height: 5\' 4"  (1.626 m)   Body mass index is 20.48 kg/m. Physical Exam Vitals signs and nursing note reviewed.  Constitutional:      General: She is not in acute distress.    Appearance: Normal appearance. She is normal weight. She is not ill-appearing, toxic-appearing or diaphoretic.  HENT:     Head: Normocephalic and atraumatic.     Nose: Nose normal.     Mouth/Throat:     Mouth: Mucous membranes are moist.  Eyes:     Extraocular Movements: Extraocular movements intact.      Pupils: Pupils are equal, round, and reactive to light.  Cardiovascular:     Rate and Rhythm: Normal rate. Rhythm irregular.     Heart sounds: No murmur.  Pulmonary:     Effort: Pulmonary effort is normal.     Breath sounds: No wheezing, rhonchi or rales.  Abdominal:     General: Bowel sounds are normal. There is no distension.     Palpations: Abdomen is soft.     Tenderness: There is no abdominal tenderness. There is no guarding or rebound.  Musculoskeletal: Normal range of motion.     Right lower leg: Edema present.     Left lower leg: Edema present.     Comments: Trace edema BLE. Ambulates with walker.   Skin:    General: Skin is warm and dry.  Neurological:     General: No focal deficit present.     Mental Status: She is alert. Mental status is at baseline.  Motor: No weakness.     Gait: Gait abnormal.     Comments: Oriented to person and her room on unit.   Psychiatric:        Mood and Affect: Mood normal.        Behavior: Behavior normal.        Thought Content: Thought content normal.        Judgment: Judgment normal.     Labs reviewed: Recent Labs    12/11/17 0807  01/29/18  04/20/18 1337 04/25/18 07/18/18  NA 139   < > 141   < > 144 135* 140  K 3.2*   < > 3.7   < > 3.3* 3.4 3.6  CL 95*   < > 100   < > 97* 93 98  CO2 35*   < > 33   < > 35* 35 33  GLUCOSE 119*  --   --   --  121*  --   --   BUN 30*   < > 33*   < > 33* 29* 28*  CREATININE 1.23*   < > 1.2*   < > 1.24* 1.2* 1.1  CALCIUM 10.0   < > 9.7   < > 10.6* 8.9 9.9  MG  --   --  1.7  --   --   --   --    < > = values in this interval not displayed.   Recent Labs    04/17/18 04/20/18 1337 04/25/18  AST ALT ALKPHOS 37 47 40  BILITOT  --  0.7  --   PROT 5.6 7.9 5.5  ALBUMIN 3.3 4.1 3.2   Recent Labs    12/05/17  12/11/17 0625  04/20/18 1337  05/01/18 09/19/18 10/01/18  WBC 8.8   < > 10.1   < > 12.8*   < > 8.9 7.7 7.5  NEUTROABS 4,928  --  7.4  --  10.7*  --   --   --   --     HGB 12.4  --  14.2   < > 15.5*   < > 12.9 10.4* 11.5*  HCT 36  --  42.0   < > 46.7*   < > 38 31* 34*  MCV  --   --  90.3  --  91.2  --   --   --   --   PLT 188  --  247   < > 269   < > 275 232 206   < > = values in this interval not displayed.   Lab Results  Component Value Date   TSH 0.90 07/12/2017   No results found for: HGBA1C No results found for: CHOL, HDL, LDLCALC, LDLDIRECT, TRIG, CHOLHDL  Significant Diagnostic Results in last 30 days:  No results found.  Assessment/Plan Chronic a-fib (HCC) Heart rate is in control, continue Propranolol, Eliquis.   Mixed Alzheimer's and vascular dementia Continue SNF [facility] for safety and care assistance.   Tremor, essential Not disabling, continue Propranolol .   Wedge compression fracture of T12 vertebra (HCC) Pain is managed, continue Tylenol  bid.   Depression with anxiety Her mood is stable, continue Sertraline  qd.   Edema Stable, last echocardiogram EF 75% 05/16/18, continue HCTZ  qd.   Gait abnormality Continue to ambulate with walker.      Family/ staff Communication: plan of care reviewed with the patient and charge nurse.   Labs/tests ordered:  None  Time spend 25 minutes.

## 2018-10-10 NOTE — Assessment & Plan Note (Signed)
Continue to ambulate with walker.  

## 2018-10-10 NOTE — Assessment & Plan Note (Signed)
Pain is managed, continue Tylenol 1000mg  bid.

## 2018-10-10 NOTE — Assessment & Plan Note (Signed)
Stable, last echocardiogram EF 75% 05/16/18, continue HCTZ 25mg  qd.

## 2018-10-10 NOTE — Assessment & Plan Note (Signed)
Not disabling, continue Propranolol.  

## 2018-10-10 NOTE — Assessment & Plan Note (Signed)
Her mood is stable, continue Sertraline 25mg qd.  

## 2018-10-10 NOTE — Assessment & Plan Note (Signed)
Continue [facility] for safety and care assistance.  

## 2018-11-07 ENCOUNTER — Encounter: Payer: Self-pay | Admitting: Nurse Practitioner

## 2018-11-07 ENCOUNTER — Non-Acute Institutional Stay (SKILLED_NURSING_FACILITY): Payer: Medicare Other | Admitting: Nurse Practitioner

## 2018-11-07 DIAGNOSIS — I10 Essential (primary) hypertension: Secondary | ICD-10-CM | POA: Diagnosis not present

## 2018-11-07 DIAGNOSIS — F0150 Vascular dementia without behavioral disturbance: Secondary | ICD-10-CM | POA: Diagnosis not present

## 2018-11-07 DIAGNOSIS — E876 Hypokalemia: Secondary | ICD-10-CM | POA: Diagnosis not present

## 2018-11-07 DIAGNOSIS — G309 Alzheimer's disease, unspecified: Secondary | ICD-10-CM

## 2018-11-07 DIAGNOSIS — G250 Essential tremor: Secondary | ICD-10-CM

## 2018-11-07 DIAGNOSIS — F418 Other specified anxiety disorders: Secondary | ICD-10-CM

## 2018-11-07 DIAGNOSIS — N183 Chronic kidney disease, stage 3 unspecified: Secondary | ICD-10-CM

## 2018-11-07 DIAGNOSIS — F0280 Dementia in other diseases classified elsewhere without behavioral disturbance: Secondary | ICD-10-CM

## 2018-11-07 DIAGNOSIS — I482 Chronic atrial fibrillation, unspecified: Secondary | ICD-10-CM

## 2018-11-07 DIAGNOSIS — R634 Abnormal weight loss: Secondary | ICD-10-CM

## 2018-11-07 DIAGNOSIS — S22080A Wedge compression fracture of T11-T12 vertebra, initial encounter for closed fracture: Secondary | ICD-10-CM

## 2018-11-07 NOTE — Assessment & Plan Note (Signed)
Heart rate is in control, continue Propranolol 80mg  qd in setting of fine tremor in fingers.

## 2018-11-07 NOTE — Assessment & Plan Note (Signed)
Blood pressure is controlled, continue Chlorthalidone 25mg  qd.

## 2018-11-07 NOTE — Assessment & Plan Note (Signed)
Fine tremor in fingers, not disabling.  

## 2018-11-07 NOTE — Assessment & Plan Note (Addendum)
#  7Ibs in the past 3 months, will update TSH, CBC/diff, CMP/eGFR, dietitian f/u.  11/12/18 TSH 0.84, wbc 7.6, Hgb 13.1, plt 201, neutrophils 58.6, Na 142, K 3.2, Bun 40, creat 1.22, TP 6.3, albumin 3.9 11/20/18 Mirtazapine 7.20m qd. Dietary supplement.

## 2018-11-07 NOTE — Assessment & Plan Note (Addendum)
Stable, continue Kcl po qd.  11/12/18 serum K 3.4, increase Kcl po qd, f/u BMP one week.

## 2018-11-07 NOTE — Assessment & Plan Note (Signed)
Stable, continue Tylenol 1000mg  bid, ambulates with walker.

## 2018-11-07 NOTE — Assessment & Plan Note (Signed)
Her mood is stable, continue Sertraline 25mg qd.  

## 2018-11-07 NOTE — Progress Notes (Addendum)
Location:  [facility] Room Number: 31 Place of Service:  SNF (31) Provider: Marlana Latus  NP  [name], MD  Patient Care Team: [name], MD as PCP - General (Internal Medicine) Nahser, Wonda Cheng, MD as PCP - Cardiology (Cardiology) Tecla Mailloux X, NP as Nurse Practitioner (Internal Medicine)  Extended Emergency Contact Information Primary Emergency Contact: Morgan,Tom Address: [address]          [place], [place] [address] Johnnette Litter of [place] Phone: [phone] Relation: Friend Secondary Emergency Contact: [place] of [place] Phone: [phone] Relation: Niece  Code Status:  DNR Goals of care: Advanced Directive information Advanced Directives 11/07/2018  Does Patient Have a Medical Advance Directive? Yes  Type of Paramedic of [place];Out of facility DNR (pink MOST or yellow form)  Does patient want to make changes to medical advance directive? No - Guardian declined  Copy of [place] in Chart? Yes - validated most recent copy scanned in chart (See row information)  Would patient like information on creating a medical advance directive? -  Pre-existing out of facility DNR order (yellow form or pink MOST form) Yellow form placed in chart (order not valid for inpatient use)     Chief Complaint  Patient presents with  . Medical Management of Chronic Issues    HPI:  Pt is a 85 y.o. female seen today for medical management of chronic diseases.     The patient resides in SNF [facility], gradual weight loss, about #7Ibs in the past 3 months. She denied nausea, vomiting, abd pain. Her mood is stable on Sertraline '25mg'$  qd. Afib, heart rate is in control, on Propranolol '80mg'$  qd, Eliquis 2.'5mg'$  bid. Hypokalemia, stable,  on Kcl 31mq po qd. Lower back pain is stable on Tylenol '1000mg'$  bid. HTN, controlled on Chlorthalidone '25mg'$  qd.    Past Medical History:  Diagnosis  Date  . Anticoagulant long-term use   . Atrial fibrillation ([facility]   . CHF (congestive heart failure) ([facility]   . Chronic anticoagulation   . Hypertension   . Raynaud phenomenon   . Tremor, essential    Past Surgical History:  Procedure Laterality Date  . CHOLECYSTECTOMY    . TONSILLECTOMY      Allergies  Allergen Reactions  . Fosamax [Alendronate]   . Other Nausea And Vomiting    Green pepper    Outpatient Encounter Medications as of 11/07/2018  Medication Sig  . acetaminophen (TYLENOL) 500 MG tablet Take 500 mg by mouth every 8 (eight) hours as needed for mild pain or moderate pain. Take 1000 mg twice daily.  .Marland Kitchenacetaminophen (TYLENOL) 500 MG tablet Take 1,000 mg by mouth 2 (two) times daily. Not to exceed 3,00 mg daily.  .Marland Kitchenapixaban (ELIQUIS) 2.5 MG TABS tablet Take 2.5 mg by mouth 2 (two) times daily.  . Calcium Carbonate-Vit D-Min (CALTRATE 600+D PLUS MINERALS) 600-800 MG-UNIT TABS Take 1 tablet by mouth 2 (two) times daily.   . chlorthalidone (HYGROTON) 25 MG tablet Take 25 mg by mouth daily.   . hydroxypropyl methylcellulose / hypromellose (ISOPTO TEARS / GONIOVISC) 2.5 % ophthalmic solution Place 1 drop into both eyes 3 (three) times daily.  . potassium chloride (K-DUR) 10 MEQ tablet Take 20 mEq by mouth daily.  . propranolol ER (INDERAL LA) 80 MG 24 hr capsule TAKE 1 CAPSULE BY MOUTH  DAILY  . sertraline (ZOLOFT) 25 MG tablet Take 25 mg by  mouth daily.   No facility-administered encounter medications on file as of 11/07/2018.    ROS ws provided with assistance of staff Review of Systems  Constitutional: Positive for unexpected weight change. Negative for activity change, appetite change, chills, diaphoresis, fatigue and fever.       #7Ibs/71mo  HENT: Positive for hearing loss. Negative for congestion and voice change.   Respiratory: Negative for cough, shortness of breath and wheezing.   Cardiovascular: Negative for chest pain, palpitations and leg swelling.   Gastrointestinal: Negative for abdominal distention, abdominal pain, constipation, diarrhea, nausea and vomiting.  Genitourinary: Negative for difficulty urinating, dysuria and urgency.  Musculoskeletal: Positive for back pain and gait problem.  Skin: Negative for color change and pallor.  Neurological: Negative for dizziness, weakness and headaches.       Dementia  Psychiatric/Behavioral: Negative for agitation, behavioral problems, hallucinations and sleep disturbance. The patient is not nervous/anxious.     Immunization History  Administered Date(s) Administered  . Influenza Whole 05/09/2018  . Influenza-Unspecified 05/28/2017  . Pneumococcal Conjugate-13 07/13/2017  . Pneumococcal Polysaccharide-23 04/07/2002  . Tdap 07/13/2017, 10/08/2017   Pertinent  Health Maintenance Due  Topic Date Due  . INFLUENZA VACCINE  03/08/2019  . DEXA SCAN  Completed  . PNA vac Low Risk Adult  Completed   Fall Risk  10/05/2017  Falls in the past year? No   Functional Status Survey:    Vitals:   11/07/18 0856  BP: 126/78  Pulse: 61  Resp: 18  Temp: 97.8 F (36.6 C)  SpO2: 92%  Weight: 117 lb 14.4 oz (53.5 kg)  Height: '5\' 4"'$  (1.626 m)   Body mass index is 20.24 kg/m. Physical Exam Constitutional:      General: She is not in acute distress.    Appearance: Normal appearance. She is normal weight. She is not ill-appearing, toxic-appearing or diaphoretic.  HENT:     Head: Normocephalic and atraumatic.     Nose: Nose normal.     Mouth/Throat:     Mouth: Mucous membranes are moist.  Eyes:     Pupils: Pupils are equal, round, and reactive to light.  Neck:     Musculoskeletal: Normal range of motion.  Cardiovascular:     Rate and Rhythm: Normal rate. Rhythm irregular.     Heart sounds: No murmur.  Pulmonary:     Effort: Pulmonary effort is normal.     Breath sounds: No wheezing, rhonchi or rales.  Abdominal:     General: There is no distension.     Palpations: Abdomen is soft.      Tenderness: There is no abdominal tenderness. There is no guarding or rebound.  Musculoskeletal:     Right lower leg: No edema.     Left lower leg: No edema.     Comments: Trace edema BLE, ambulates with walker.   Skin:    General: Skin is warm and dry.  Neurological:     General: No focal deficit present.     Mental Status: She is alert. Mental status is at baseline.     Cranial Nerves: No cranial nerve deficit.     Motor: No weakness.     Coordination: Coordination normal.     Gait: Gait abnormal.     Comments: Oriented to self.  Psychiatric:        Mood and Affect: Mood normal.        Behavior: Behavior normal.     Labs reviewed: Recent Labs    12/11/17 0807  01/29/18  04/20/18 1337 04/25/18 07/18/18  NA 139   < > 141   < > 144 135* 140  K 3.2*   < > 3.7   < > 3.3* 3.4 3.6  CL 95*   < > 100   < > 97* 93 98  CO2 35*   < > 33   < > 35* 35 33  GLUCOSE 119*  --   --   --  121*  --   --   BUN 30*   < > 33*   < > 33* 29* 28*  CREATININE 1.23*   < > 1.2*   < > 1.24* 1.2* 1.1  CALCIUM 10.0   < > 9.7   < > 10.6* 8.9 9.9  MG  --   --  1.7  --   --   --   --    < > = values in this interval not displayed.   Recent Labs    04/17/18 04/20/18 1337 04/25/18  AST '17 24 14  '$ ALT '9 16 8  '$ ALKPHOS 37 47 40  BILITOT  --  0.7  --   PROT 5.6 7.9 5.5  ALBUMIN 3.3 4.1 3.2   Recent Labs    12/05/17  12/11/17 0625  04/20/18 1337  05/01/18 09/19/18 10/01/18  WBC 8.8   < > 10.1   < > 12.8*   < > 8.9 7.7 7.5  NEUTROABS 4,928  --  7.4  --  10.7*  --   --   --   --   HGB 12.4  --  14.2   < > 15.5*   < > 12.9 10.4* 11.5*  HCT 36  --  42.0   < > 46.7*   < > 38 31* 34*  MCV  --   --  90.3  --  91.2  --   --   --   --   PLT 188  --  247   < > 269   < > 275 232 206   < > = values in this interval not displayed.   Lab Results  Component Value Date   TSH 0.90 07/12/2017   No results found for: HGBA1C No results found for: CHOL, HDL, LDLCALC, LDLDIRECT, TRIG, CHOLHDL  Significant  Diagnostic Results in last 30 days:  No results found.  Assessment/Plan Weight loss #7Ibs in the past 3 months, will update TSH, CBC/diff, CMP/eGFR, dietitian f/u.  11/12/18 TSH 0.84, wbc 7.6, Hgb 13.1, plt 201, neutrophils 58.6, Na 142, K 3.2, Bun 40, creat 1.22, TP 6.3, albumin 3.9 11/20/18 Mirtazapine 7.'5mg'$  qd. Dietary supplement.   Chronic a-fib (HCC) Heart rate is in control, continue Propranolol '80mg'$  qd in setting of fine tremor in fingers.   Mixed Alzheimer's and vascular dementia Continue SNF [facility] for safety and care assistance, ambulates with walker.   Tremor, essential Fine tremor in fingers, not disabling.   Wedge compression fracture of T12 vertebra (HCC) Stable, continue Tylenol '1000mg'$  bid, ambulates with walker.   Depression with anxiety Her mood is stable, continue Sertraline '25mg'$  qd.   Hypokalemia Stable, continue Kcl 38mq po qd.  11/12/18 serum K 3.4, increase Kcl 441m po qd, f/u BMP one week.   Hypertension Blood pressure is controlled, continue Chlorthalidone '25mg'$  qd.      Family/ staff Communication: plan of care reviewed with the patient and charge nurse.   Labs/tests ordered:  CBC/diff, CMP/eGFR, TSH  Time spend 25 minutes.

## 2018-11-07 NOTE — Assessment & Plan Note (Signed)
Continue SNF [facility] for safety and care assistance, ambulates with walker.  

## 2018-11-12 DIAGNOSIS — D72829 Elevated white blood cell count, unspecified: Secondary | ICD-10-CM | POA: Diagnosis not present

## 2018-11-12 DIAGNOSIS — Z79899 Other long term (current) drug therapy: Secondary | ICD-10-CM | POA: Diagnosis not present

## 2018-11-12 DIAGNOSIS — Z1329 Encounter for screening for other suspected endocrine disorder: Secondary | ICD-10-CM | POA: Diagnosis not present

## 2018-11-12 DIAGNOSIS — N183 Chronic kidney disease, stage 3 (moderate): Secondary | ICD-10-CM | POA: Diagnosis not present

## 2018-11-12 DIAGNOSIS — E039 Hypothyroidism, unspecified: Secondary | ICD-10-CM | POA: Diagnosis not present

## 2018-11-12 LAB — BASIC METABOLIC PANEL
BUN: 40 — AB (ref 4–21)
Creatinine: 1.2 — AB (ref 0.5–1.1)

## 2018-11-21 DIAGNOSIS — E876 Hypokalemia: Secondary | ICD-10-CM | POA: Diagnosis not present

## 2018-11-25 ENCOUNTER — Encounter: Payer: Self-pay | Admitting: Nurse Practitioner

## 2018-11-25 ENCOUNTER — Encounter (HOSPITAL_COMMUNITY): Payer: Self-pay

## 2018-11-25 ENCOUNTER — Other Ambulatory Visit: Payer: Self-pay

## 2018-11-25 ENCOUNTER — Non-Acute Institutional Stay (SKILLED_NURSING_FACILITY): Payer: Medicare Other | Admitting: Nurse Practitioner

## 2018-11-25 ENCOUNTER — Emergency Department (HOSPITAL_COMMUNITY): Payer: Medicare Other

## 2018-11-25 ENCOUNTER — Emergency Department (HOSPITAL_COMMUNITY)
Admission: EM | Admit: 2018-11-25 | Discharge: 2018-11-25 | Disposition: A | Discharge status: Home / Self Care | Payer: Medicare Other | Attending: Emergency Medicine | Admitting: Emergency Medicine

## 2018-11-25 DIAGNOSIS — Y92129 Unspecified place in nursing home as the place of occurrence of the external cause: Secondary | ICD-10-CM | POA: Insufficient documentation

## 2018-11-25 DIAGNOSIS — E876 Hypokalemia: Secondary | ICD-10-CM

## 2018-11-25 DIAGNOSIS — S0003XA Contusion of scalp, initial encounter: Secondary | ICD-10-CM

## 2018-11-25 DIAGNOSIS — F0390 Unspecified dementia without behavioral disturbance: Secondary | ICD-10-CM | POA: Insufficient documentation

## 2018-11-25 DIAGNOSIS — I482 Chronic atrial fibrillation, unspecified: Secondary | ICD-10-CM | POA: Diagnosis not present

## 2018-11-25 DIAGNOSIS — Y939 Activity, unspecified: Secondary | ICD-10-CM | POA: Diagnosis not present

## 2018-11-25 DIAGNOSIS — Y999 Unspecified external cause status: Secondary | ICD-10-CM | POA: Diagnosis not present

## 2018-11-25 DIAGNOSIS — S0003XD Contusion of scalp, subsequent encounter: Secondary | ICD-10-CM | POA: Diagnosis not present

## 2018-11-25 DIAGNOSIS — G309 Alzheimer's disease, unspecified: Secondary | ICD-10-CM | POA: Diagnosis not present

## 2018-11-25 DIAGNOSIS — F0150 Vascular dementia without behavioral disturbance: Secondary | ICD-10-CM

## 2018-11-25 DIAGNOSIS — I509 Heart failure, unspecified: Secondary | ICD-10-CM | POA: Diagnosis not present

## 2018-11-25 DIAGNOSIS — Z79899 Other long term (current) drug therapy: Secondary | ICD-10-CM | POA: Diagnosis not present

## 2018-11-25 DIAGNOSIS — Z7901 Long term (current) use of anticoagulants: Secondary | ICD-10-CM | POA: Diagnosis not present

## 2018-11-25 DIAGNOSIS — I130 Hypertensive heart and chronic kidney disease with heart failure and stage 1 through stage 4 chronic kidney disease, or unspecified chronic kidney disease: Secondary | ICD-10-CM | POA: Insufficient documentation

## 2018-11-25 DIAGNOSIS — R269 Unspecified abnormalities of gait and mobility: Secondary | ICD-10-CM | POA: Diagnosis not present

## 2018-11-25 DIAGNOSIS — F0280 Dementia in other diseases classified elsewhere without behavioral disturbance: Secondary | ICD-10-CM | POA: Diagnosis not present

## 2018-11-25 DIAGNOSIS — W19XXXA Unspecified fall, initial encounter: Secondary | ICD-10-CM | POA: Diagnosis not present

## 2018-11-25 DIAGNOSIS — S0990XA Unspecified injury of head, initial encounter: Secondary | ICD-10-CM | POA: Diagnosis not present

## 2018-11-25 DIAGNOSIS — R404 Transient alteration of awareness: Secondary | ICD-10-CM | POA: Diagnosis not present

## 2018-11-25 DIAGNOSIS — N183 Chronic kidney disease, stage 3 (moderate): Secondary | ICD-10-CM | POA: Insufficient documentation

## 2018-11-25 DIAGNOSIS — S199XXA Unspecified injury of neck, initial encounter: Secondary | ICD-10-CM | POA: Diagnosis not present

## 2018-11-25 NOTE — Assessment & Plan Note (Signed)
The right forehead traumatic hematoma, CT head/cervical spine in ED showed no acute intracranial process. Will update CBC/diff, CMP/eGFR in setting of Eliquis use and Hx of hypokalemia. Observe.

## 2018-11-25 NOTE — Assessment & Plan Note (Signed)
Continue Kcl, update CMP 

## 2018-11-25 NOTE — ED Triage Notes (Signed)
Per [facility], pt from NH w/ a c/o a right sided head injury that she sustained from a fall. Pt takes Eliquis. Bruising noted to the right frontal/temporal region. No bleeding noted. Pt has a hx of dementia. She is conscious but AOx0. The fall was not witnessed. It is unknown if the pt had a LOC. She was last seen sitting on the side of her bed.  BP 118/p HR 52 RR 20 T. 98.1 SpO2 97% RA  c-collar in place

## 2018-11-25 NOTE — ED Notes (Signed)
C collar removed

## 2018-11-25 NOTE — ED Provider Notes (Signed)
[facility] EMERGENCY DEPARTMENT Provider Note   CSN: [id number] Arrival date & time: 11/25/18  0631    History   Chief Complaint Chief Complaint  Patient presents with   Fall   Head Injury    HPI Angelica Mejia is a 85 y.o. female.     Patient is a 85 year old female with a history of dementia who presents after a fall.  History is limited due to her dementia.  I spoke with the staff at the nursing home where the patient lives and reportedly patient had a fall around 5:00 this morning.  It was felt that the patient was trying to get up and go the bathroom and could not get to her walker and fell and hit her head.  Per the staff, she is at her baseline mental status.  She has had no change in behavior.  No recent illnesses.  No recent fevers.  No cough or cold symptoms.  No shortness of breath.  She has a contusion to her forehead but otherwise they have not noted any injuries.  She was brought in by EMS.  She is on Eliquis for atrial fibrillation.     Past Medical History:  Diagnosis Date   Anticoagulant long-term use    Atrial fibrillation (HCC)    CHF (congestive heart failure) (HCC)    Chronic anticoagulation    Hypertension    Raynaud phenomenon    Tremor, essential     Patient Active Problem List   Diagnosis Date Noted   Gait abnormality 09/26/2018   Dysphagia 09/12/2018   Weight loss 07/17/2018   Depression with anxiety 05/21/2018   Edema 05/13/2018   Wedge compression fracture of T12 vertebra (HCC) 04/26/2018   Urinary frequency 04/16/2018   UTI (urinary tract infection) 12/12/2017   Fall 12/12/2017   Urinary incontinence 12/04/2017   Dry eye syndrome of both eyes 10/22/2017   Mixed Alzheimer's and vascular dementia (HCC) 10/16/2017   CKD (chronic kidney disease) stage 3, GFR 30-59 ml/min (HCC) 10/09/2017   Neuropathy 10/09/2017   Hypokalemia 10/09/2017   Anemia, chronic disease 07/11/2017   History of fracture  of clavicle 05/16/2017   Chronic a-fib    Tremor, essential    Chronic anticoagulation    Hypertension    Raynaud phenomenon     Past Surgical History:  Procedure Laterality Date   CHOLECYSTECTOMY     TONSILLECTOMY       OB History   No obstetric history on file.      Home Medications    Prior to Admission medications   Medication Sig Start Date End Date Taking? Authorizing Provider  acetaminophen (TYLENOL) 500 MG tablet Take 500 mg by mouth every 8 (eight) hours as needed for mild pain or moderate pain.    Yes [provider]  acetaminophen (TYLENOL) 500 MG tablet Take 1,000 mg by mouth 2 (two) times daily. Not to exceed 3,00 mg daily.   Yes [provider]  apixaban (ELIQUIS) 2.5 MG TABS tablet Take 2.5 mg by mouth 2 (two) times daily.   Yes [provider]  Calcium Carbonate-Vit D-Min (CALTRATE 600+D PLUS MINERALS) 600-800 MG-UNIT CHEW Chew 1 tablet by mouth 2 (two) times a day.   Yes [provider]  chlorthalidone (HYGROTON) 25 MG tablet Take 25 mg by mouth daily.  05/11/17  Yes [provider]  Glycerin-Hypromellose-PEG 400 (ARTIFICIAL TEARS) 0.2-0.2-1 % SOLN Apply 1 drop to eye 3 (three) times daily.   Yes [provider]  mirtazapine (REMERON) 15 MG tablet Take 7.5 mg by mouth at bedtime.   Yes [provider]  potassium chloride SA (K-DUR) 20 MEQ tablet Take 40 mEq by mouth daily.   Yes [provider]  propranolol ER (INDERAL LA) 80 MG 24 hr capsule TAKE 1 CAPSULE BY MOUTH  DAILY Patient taking differently: Take 80 mg by mouth daily.  01/30/17  Yes Nilda RiggsMartin, Nancy Carolyn, NP  sertraline (ZOLOFT) 25 MG tablet Take 25 mg by mouth every evening.    Yes [provider]    Family History Family History  Problem Relation Age of Onset   Hypertension Father    Heart attack Father    Heart disease Other    Hypertension Other    Stroke Other    Cancer Other     Social  History Social History   Tobacco Use   Smoking status: Never Smoker   Smokeless tobacco: Never Used  Substance Use Topics   Alcohol use: No   Drug use: No     Allergies   Fosamax [alendronate] and Other   Review of Systems Review of Systems  Unable to perform ROS: Dementia     Physical Exam Updated Vital Signs BP (!) 131/98    Pulse 86    Temp 98.9 F (37.2 C) (Oral)    Resp 10    SpO2 98%   Physical Exam Constitutional:      Appearance: She is well-developed.  HENT:     Head: Normocephalic.     Comments: Patient has moderate sized hematoma to her right forehead.  There is no swelling over the facial bones.  No tenderness of the facial bones.  Pupils are equal bilaterally.    Mouth/Throat:     Comments: She has some dried blood on her front tooth but I do not note any obvious dental injury.  No obvious intraoral lacerations. Eyes:     Pupils: Pupils are equal, round, and reactive to light.  Neck:     Musculoskeletal: Normal range of motion and neck supple.  Cardiovascular:     Rate and Rhythm: Normal rate and regular rhythm.     Heart sounds: Normal heart sounds.  Pulmonary:     Effort: Pulmonary effort is normal. No respiratory distress.     Breath sounds: Normal breath sounds. No wheezing or rales.  Chest:     Chest wall: No tenderness.  Abdominal:     General: Bowel sounds are normal.     Palpations: Abdomen is soft.     Tenderness: There is no abdominal tenderness. There is no guarding or rebound.  Musculoskeletal: Normal range of motion.     Comments: No pain on palpation or range of motion of her extremities.  No pain on palpation of the cervical, thoracic or lumbosacral spine  Lymphadenopathy:     Cervical: No cervical adenopathy.  Skin:    General: Skin is warm and dry.     Findings: No rash.  Neurological:     Mental Status: She is alert.     Comments: Patient is awake with eyes open and will answer questions although she is confused and her  answers do not make sense.  She is able to move all extremities and she seems to be symmetric bilaterally.      ED Treatments / Results  Labs (all labs ordered are listed, but only abnormal results are displayed) Labs Reviewed - No data to display  EKG None  Radiology Ct Head Wo  Contrast  Result Date: 11/25/2018 CLINICAL DATA:  Status post fall today with a blow to the head. Initial encounter. EXAM: CT HEAD WITHOUT CONTRAST TECHNIQUE: Contiguous axial images were obtained from the base of the skull through the vertex without intravenous contrast. COMPARISON:  Head CT scan 04/20/2018. FINDINGS: Brain: No evidence of acute infarction, hemorrhage, hydrocephalus, extra-axial collection or mass lesion/mass effect. Atrophy and extensive chronic microvascular ischemic change noted. Vascular: Atherosclerosis is seen. Skull: Intact.  No focal lesion. Sinuses/Orbits: Status post cataract surgery.  Otherwise negative. Other: Hematoma over the right frontal bone noted. IMPRESSION: Subcutaneous hematoma anteriorly on the right without underlying fracture or acute intracranial normality. Cortical atrophy and extensive chronic microvascular ischemic change. Atherosclerosis. Electronically Signed   By: Drusilla Kanner M.D.   On: 11/25/2018 07:53   Ct Cervical Spine Wo Contrast  Result Date: 11/25/2018 CLINICAL DATA:  History of dementia. Status post fall today with a blow to the head. Initial encounter. EXAM: CT CERVICAL SPINE WITHOUT CONTRAST TECHNIQUE: Multidetector CT imaging of the cervical spine was performed without intravenous contrast. Multiplanar CT image reconstructions were also generated. COMPARISON:  Cervical spine CT scan 12/11/2017. FINDINGS: Alignment: Maintained. Skull base and vertebrae: No acute fracture. No primary bone lesion or focal pathologic process. Soft tissues and spinal canal: No prevertebral fluid or swelling. No visible canal hematoma. Disc levels:  Intervertebral disc space  height is maintained. Upper chest: Lung apices clear. Other: Extensive atherosclerosis noted. Calcified mediastinal lymph nodes also noted. IMPRESSION: No acute abnormality. Atherosclerosis. Electronically Signed   By: Drusilla Kanner M.D.   On: 11/25/2018 07:57    Procedures Procedures (including critical care time)  Medications Ordered in ED Medications - No data to display   Initial Impression / Assessment and Plan / ED Course  I have reviewed the triage vital signs and the nursing notes.  Pertinent labs & imaging results that were available during my care of the patient were reviewed by me and considered in my medical decision making (see chart for details).        Patient is a 85 year old female with a history of dementia, on Eliquis who presents after a fall.  Her CT scan of her head and cervical spine are negative for acute abnormalities.  She reportedly is at her baseline mental status per the nursing staff.  She does not have any other apparent injuries.  Her vital signs are stable.  She was discharged back to the nursing facility.  Return precautions were given on discharge instructions.  Final Clinical Impressions(s) / ED Diagnoses   Final diagnoses:  Injury of head, initial encounter  Fall, initial encounter  Contusion of scalp, initial encounter    ED Discharge Orders    None       Rolan Bucco, MD 11/25/18 [phone]

## 2018-11-25 NOTE — ED Notes (Signed)
[facility] contacted 

## 2018-11-25 NOTE — ED Notes (Signed)
[facility] called for transport.  

## 2018-11-25 NOTE — Discharge Instructions (Signed)
Return to emergency department if this patient has any change in her mental status, vomiting, fevers or other worsening symptoms.

## 2018-11-25 NOTE — Assessment & Plan Note (Signed)
Continue [facility] for safety and care assistance.  

## 2018-11-25 NOTE — ED Notes (Signed)
Patient transported to CT 

## 2018-11-25 NOTE — Assessment & Plan Note (Signed)
Heart rate is in control, continue Propranolol.  

## 2018-11-25 NOTE — ED Notes (Signed)
Report called to [facility] & given to [place], LPN.

## 2018-11-25 NOTE — Assessment & Plan Note (Signed)
Lack of safety awareness, increased frailty are contributory, close supervision for safety.  

## 2018-11-25 NOTE — Assessment & Plan Note (Signed)
Continue ambulating with walker, close supervision needed for safety.  

## 2018-11-25 NOTE — Progress Notes (Signed)
Location:   SNF [place] Room Number: 74 Place of Service:  SNF (31) Provider: [facility] Berdie Malter NP  [name], MD  Patient Care Team: [name], MD as PCP - General (Internal Medicine) Nahser, Wonda Cheng, MD as PCP - Cardiology (Cardiology) Rynell Ciotti X, NP as Nurse Practitioner (Internal Medicine)  Extended Emergency Contact Information Primary Emergency Contact: Morgan,Tom Address: [address]          [place], [place] [address] Johnnette Litter of [place] Phone: [phone] Relation: Friend Secondary Emergency Contact: [place] of [place] Phone: [phone] Relation: Niece  Code Status: DNR Goals of care: Advanced Directive information Advanced Directives 11/25/2018  Does Patient Have a Medical Advance Directive? Yes  Type of Paramedic of [place];Out of facility DNR (pink MOST or yellow form)  Does patient want to make changes to medical advance directive? No - Patient declined  Copy of [place] in Chart? Yes - validated most recent copy scanned in chart (See row information)  Would patient like information on creating a medical advance directive? -  Pre-existing out of facility DNR order (yellow form or pink MOST form) Yellow form placed in chart (order not valid for inpatient use)     Chief Complaint  Patient presents with   Acute Visit    FALL    HPI:  Pt is a 85 y.o. female seen today for an acute visit for s/p ED eval for the right forehead hematoma sustained from a mechanical fall 11/25/18. CT head, C-spine unremarkable. HPI was provided with assistance of staff. She denied headache except the right forehead tenderness when touched, vision change, chest pain/pressure, palpitation, or focal weakness. She resides in [facility] for safety and care assistance, ambulates with walker. Afib heart rate is in control, on Propranolol '80mg'$  qd, Eliquis 2.'5mg'$  bid for thromboembolic risk  reduction.    Past Medical History:  Diagnosis Date   Anticoagulant long-term use    Atrial fibrillation (HCC)    CHF (congestive heart failure) (HCC)    Chronic anticoagulation    Hypertension    Raynaud phenomenon    Tremor, essential    Past Surgical History:  Procedure Laterality Date   CHOLECYSTECTOMY     TONSILLECTOMY      Allergies  Allergen Reactions   Fosamax [Alendronate]    Other Nausea And Vomiting    Green pepper    Allergies as of 11/25/2018      Reactions   Fosamax [alendronate]    Other Nausea And Vomiting   Green pepper      Medication List       Accurate as of November 25, 2018  4:54 PM. Always use your most recent med list.        acetaminophen 500 MG tablet Commonly known as:  TYLENOL Take 500 mg by mouth every 8 (eight) hours as needed for mild pain or moderate pain.   acetaminophen 500 MG tablet Commonly known as:  TYLENOL Take 1,000 mg by mouth 2 (two) times daily. Not to exceed 3,00 mg daily.   Artificial Tears 0.2-0.2-1 % Soln Generic drug:  Glycerin-Hypromellose-PEG 400 Apply 1 drop to eye 3 (three) times daily.   Caltrate 600+D Plus Minerals 600-800 MG-UNIT Chew Chew 1 tablet by mouth 2 (two) times a day.   chlorthalidone 25 MG tablet Commonly known as:  HYGROTON Take 25 mg by mouth daily.   Eliquis 2.5 MG Tabs tablet Generic drug:  apixaban Take 2.5 mg by mouth 2 (two) times daily.   mirtazapine 15 MG tablet Commonly known as:  REMERON Take 7.5 mg by mouth at bedtime.   potassium chloride SA 20 MEQ tablet Commonly known as:  K-DUR Take 40 mEq by mouth daily.   propranolol ER 80 MG 24 hr capsule Commonly known as:  INDERAL LA TAKE 1 CAPSULE BY MOUTH  DAILY   sertraline 25 MG tablet Commonly known as:  ZOLOFT Take 25 mg by mouth every evening.      ROS was provided with assistance of staff Review of Systems  Constitutional: Negative for activity change, appetite change, chills, diaphoresis, fatigue and  fever.  HENT: Positive for hearing loss. Negative for congestion and voice change.   Respiratory: Negative for cough, shortness of breath and wheezing.   Cardiovascular: Negative for chest pain, palpitations and leg swelling.  Gastrointestinal: Negative for abdominal distention, abdominal pain, constipation, diarrhea, nausea and vomiting.  Genitourinary: Negative for difficulty urinating, dysuria and urgency.  Musculoskeletal: Positive for back pain and gait problem.  Skin: Positive for color change and wound.  Neurological: Negative for dizziness, facial asymmetry, speech difficulty, weakness and headaches.       Dementia.  Psychiatric/Behavioral: Positive for confusion. Negative for agitation, behavioral problems, hallucinations and sleep disturbance. The patient is not nervous/anxious.     Immunization History  Administered Date(s) Administered   Influenza Whole 05/09/2018   Influenza-Unspecified 05/28/2017   Pneumococcal Conjugate-13 07/13/2017   Pneumococcal Polysaccharide-23 04/07/2002   Tdap 07/13/2017, 10/08/2017   Pertinent  Health Maintenance Due  Topic Date Due   INFLUENZA VACCINE  03/08/2019   DEXA SCAN  Completed   PNA vac Low Risk Adult  Completed   Fall Risk  10/05/2017  Falls in the past year? No   Functional Status Survey:    Vitals:   11/25/18 1506  BP: (!) 142/76  Pulse: 90  Resp: 20  Temp: 98 F (36.7 C)  SpO2: 93%  Weight: 114 lb (51.7 kg)  Height: '5\' 4"'$  (1.626 m)   Body mass index is 19.57 kg/m. Physical Exam Vitals signs and nursing note reviewed.  Constitutional:      General: She is not in acute distress.    Appearance: Normal appearance. She is not ill-appearing, toxic-appearing or diaphoretic.  HENT:     Head: Normocephalic. Contusion present.     Jaw: No tenderness.     Comments: The right forehead contusion/hematoma.     Nose: Nose normal.     Mouth/Throat:     Mouth: Mucous membranes are moist.  Eyes:     Extraocular  Movements: Extraocular movements intact.     Conjunctiva/sclera: Conjunctivae normal.     Pupils: Pupils are equal, round, and reactive to light.  Neck:     Musculoskeletal: Normal range of motion and neck supple.  Cardiovascular:     Rate and Rhythm: Normal rate. Rhythm irregular.     Heart sounds: No murmur.  Pulmonary:     Effort: Pulmonary effort is normal.     Breath sounds: No wheezing, rhonchi or rales.  Abdominal:     General: There is no distension.     Palpations: Abdomen is soft.     Tenderness: There is no abdominal tenderness. There is no right CVA tenderness, left CVA tenderness, guarding or rebound.  Musculoskeletal:     Right lower leg: No edema.     Left lower leg: No edema.  Skin:    General: Skin is warm.  Findings: Bruising present.     Comments: The right forehead hematoma.   Neurological:     General: No focal deficit present.     Mental Status: She is alert. Mental status is at baseline.     Cranial Nerves: No cranial nerve deficit.     Motor: No weakness.     Coordination: Coordination normal.     Gait: Gait abnormal.     Comments: Oriented to self.   Psychiatric:        Mood and Affect: Mood normal.        Behavior: Behavior normal.     Labs reviewed: Recent Labs    12/11/17 0807  01/29/18  04/20/18 1337 04/25/18 07/18/18  NA 139   < > 141   < > 144 135* 140  K 3.2*   < > 3.7   < > 3.3* 3.4 3.6  CL 95*   < > 100   < > 97* 93 98  CO2 35*   < > 33   < > 35* 35 33  GLUCOSE 119*  --   --   --  121*  --   --   BUN 30*   < > 33*   < > 33* 29* 28*  CREATININE 1.23*   < > 1.2*   < > 1.24* 1.2* 1.1  CALCIUM 10.0   < > 9.7   < > 10.6* 8.9 9.9  MG  --   --  1.7  --   --   --   --    < > = values in this interval not displayed.   Recent Labs    04/17/18 04/20/18 1337 04/25/18  AST '17 24 14  '$ ALT '9 16 8  '$ ALKPHOS 37 47 40  BILITOT  --  0.7  --   PROT 5.6 7.9 5.5  ALBUMIN 3.3 4.1 3.2   Recent Labs    12/05/17  12/11/17 0625  04/20/18 1337   05/01/18 09/19/18 10/01/18  WBC 8.8   < > 10.1   < > 12.8*   < > 8.9 7.7 7.5  NEUTROABS 4,928  --  7.4  --  10.7*  --   --   --   --   HGB 12.4  --  14.2   < > 15.5*   < > 12.9 10.4* 11.5*  HCT 36  --  42.0   < > 46.7*   < > 38 31* 34*  MCV  --   --  90.3  --  91.2  --   --   --   --   PLT 188  --  247   < > 269   < > 275 232 206   < > = values in this interval not displayed.   Lab Results  Component Value Date   TSH 0.90 07/12/2017   No results found for: HGBA1C No results found for: CHOL, HDL, LDLCALC, LDLDIRECT, TRIG, CHOLHDL  Significant Diagnostic Results in last 30 days:  Ct Head Wo Contrast  Result Date: 11/25/2018 CLINICAL DATA:  Status post fall today with a blow to the head. Initial encounter. EXAM: CT HEAD WITHOUT CONTRAST TECHNIQUE: Contiguous axial images were obtained from the base of the skull through the vertex without intravenous contrast. COMPARISON:  Head CT scan 04/20/2018. FINDINGS: Brain: No evidence of acute infarction, hemorrhage, hydrocephalus, extra-axial collection or mass lesion/mass effect. Atrophy and extensive chronic microvascular ischemic change noted. Vascular: Atherosclerosis is seen. Skull: Intact.  No  focal lesion. Sinuses/Orbits: Status post cataract surgery.  Otherwise negative. Other: Hematoma over the right frontal bone noted. IMPRESSION: Subcutaneous hematoma anteriorly on the right without underlying fracture or acute intracranial normality. Cortical atrophy and extensive chronic microvascular ischemic change. Atherosclerosis. Electronically Signed   By: Inge Rise M.D.   On: 11/25/2018 07:53   Ct Cervical Spine Wo Contrast  Result Date: 11/25/2018 CLINICAL DATA:  History of dementia. Status post fall today with a blow to the head. Initial encounter. EXAM: CT CERVICAL SPINE WITHOUT CONTRAST TECHNIQUE: Multidetector CT imaging of the cervical spine was performed without intravenous contrast. Multiplanar CT image reconstructions were also  generated. COMPARISON:  Cervical spine CT scan 12/11/2017. FINDINGS: Alignment: Maintained. Skull base and vertebrae: No acute fracture. No primary bone lesion or focal pathologic process. Soft tissues and spinal canal: No prevertebral fluid or swelling. No visible canal hematoma. Disc levels:  Intervertebral disc space height is maintained. Upper chest: Lung apices clear. Other: Extensive atherosclerosis noted. Calcified mediastinal lymph nodes also noted. IMPRESSION: No acute abnormality. Atherosclerosis. Electronically Signed   By: Inge Rise M.D.   On: 11/25/2018 07:57    Assessment/Plan: Scalp contusion The right forehead traumatic hematoma, CT head/cervical spine in ED showed no acute intracranial process. Will update CBC/diff, CMP/eGFR in setting of Eliquis use and Hx of hypokalemia. Observe.   Fall Lack of safety awareness, increased frailty are contributory, close supervision for safety.   Gait abnormality Continue ambulating with walker, close supervision needed for safety  Mixed Alzheimer's and vascular dementia Continue SNF [facility] for safety and care assistance.   Chronic a-fib (HCC) Heart rate is in control, continue Propranolol.   Hypokalemia Continue Kcl, update CMP    Family/ staff Communication: plan of care reviewed with the patient and charge nurse.   Labs/tests ordered: CBC/diff, CMP/eGFR  Time spend 25 minutes.

## 2018-11-26 DIAGNOSIS — S0003XA Contusion of scalp, initial encounter: Secondary | ICD-10-CM | POA: Diagnosis not present

## 2018-11-26 LAB — CBC AND DIFFERENTIAL
HCT: 38 (ref 36–46)
Hemoglobin: 13 (ref 12.0–16.0)
Platelets: 160 (ref 150–399)
WBC: 7.8

## 2018-11-26 LAB — BASIC METABOLIC PANEL
BUN: 38 — AB (ref 4–21)
Creatinine: 1.3 — AB (ref 0.5–1.1)
Glucose: 87
Potassium: 3.9 (ref 3.4–5.3)
Sodium: 144 (ref 137–147)

## 2018-11-26 LAB — HEPATIC FUNCTION PANEL
ALT: 6 — AB (ref 7–35)
AST: 15 (ref 13–35)
Alkaline Phosphatase: 38 (ref 25–125)
Bilirubin, Total: 0.5

## 2018-11-29 DIAGNOSIS — R296 Repeated falls: Secondary | ICD-10-CM | POA: Diagnosis not present

## 2018-11-29 DIAGNOSIS — Z9181 History of falling: Secondary | ICD-10-CM | POA: Diagnosis not present

## 2018-11-29 DIAGNOSIS — M6281 Muscle weakness (generalized): Secondary | ICD-10-CM | POA: Diagnosis not present

## 2018-12-03 DIAGNOSIS — M6281 Muscle weakness (generalized): Secondary | ICD-10-CM | POA: Diagnosis not present

## 2018-12-03 DIAGNOSIS — Z9181 History of falling: Secondary | ICD-10-CM | POA: Diagnosis not present

## 2018-12-03 DIAGNOSIS — R296 Repeated falls: Secondary | ICD-10-CM | POA: Diagnosis not present

## 2018-12-04 DIAGNOSIS — Z9181 History of falling: Secondary | ICD-10-CM | POA: Diagnosis not present

## 2018-12-04 DIAGNOSIS — R296 Repeated falls: Secondary | ICD-10-CM | POA: Diagnosis not present

## 2018-12-04 DIAGNOSIS — M6281 Muscle weakness (generalized): Secondary | ICD-10-CM | POA: Diagnosis not present

## 2018-12-05 DIAGNOSIS — R296 Repeated falls: Secondary | ICD-10-CM | POA: Diagnosis not present

## 2018-12-05 DIAGNOSIS — Z9181 History of falling: Secondary | ICD-10-CM | POA: Diagnosis not present

## 2018-12-05 DIAGNOSIS — M6281 Muscle weakness (generalized): Secondary | ICD-10-CM | POA: Diagnosis not present

## 2018-12-09 DIAGNOSIS — Z9181 History of falling: Secondary | ICD-10-CM | POA: Diagnosis not present

## 2018-12-09 DIAGNOSIS — S22080A Wedge compression fracture of T11-T12 vertebra, initial encounter for closed fracture: Secondary | ICD-10-CM | POA: Diagnosis not present

## 2018-12-09 DIAGNOSIS — R296 Repeated falls: Secondary | ICD-10-CM | POA: Diagnosis not present

## 2018-12-09 DIAGNOSIS — M6281 Muscle weakness (generalized): Secondary | ICD-10-CM | POA: Diagnosis not present

## 2018-12-11 DIAGNOSIS — Z9181 History of falling: Secondary | ICD-10-CM | POA: Diagnosis not present

## 2018-12-11 DIAGNOSIS — S22080A Wedge compression fracture of T11-T12 vertebra, initial encounter for closed fracture: Secondary | ICD-10-CM | POA: Diagnosis not present

## 2018-12-11 DIAGNOSIS — R296 Repeated falls: Secondary | ICD-10-CM | POA: Diagnosis not present

## 2018-12-11 DIAGNOSIS — M6281 Muscle weakness (generalized): Secondary | ICD-10-CM | POA: Diagnosis not present

## 2018-12-12 DIAGNOSIS — R296 Repeated falls: Secondary | ICD-10-CM | POA: Diagnosis not present

## 2018-12-12 DIAGNOSIS — Z9181 History of falling: Secondary | ICD-10-CM | POA: Diagnosis not present

## 2018-12-12 DIAGNOSIS — S22080A Wedge compression fracture of T11-T12 vertebra, initial encounter for closed fracture: Secondary | ICD-10-CM | POA: Diagnosis not present

## 2018-12-12 DIAGNOSIS — M6281 Muscle weakness (generalized): Secondary | ICD-10-CM | POA: Diagnosis not present

## 2018-12-16 DIAGNOSIS — R296 Repeated falls: Secondary | ICD-10-CM | POA: Diagnosis not present

## 2018-12-16 DIAGNOSIS — Z9181 History of falling: Secondary | ICD-10-CM | POA: Diagnosis not present

## 2018-12-16 DIAGNOSIS — S22080A Wedge compression fracture of T11-T12 vertebra, initial encounter for closed fracture: Secondary | ICD-10-CM | POA: Diagnosis not present

## 2018-12-16 DIAGNOSIS — M6281 Muscle weakness (generalized): Secondary | ICD-10-CM | POA: Diagnosis not present

## 2018-12-17 ENCOUNTER — Encounter: Payer: Self-pay | Admitting: Internal Medicine

## 2018-12-17 ENCOUNTER — Non-Acute Institutional Stay (SKILLED_NURSING_FACILITY): Payer: Medicare Other | Admitting: Internal Medicine

## 2018-12-17 DIAGNOSIS — R296 Repeated falls: Secondary | ICD-10-CM | POA: Diagnosis not present

## 2018-12-17 DIAGNOSIS — R634 Abnormal weight loss: Secondary | ICD-10-CM

## 2018-12-17 DIAGNOSIS — S22080A Wedge compression fracture of T11-T12 vertebra, initial encounter for closed fracture: Secondary | ICD-10-CM | POA: Diagnosis not present

## 2018-12-17 DIAGNOSIS — I10 Essential (primary) hypertension: Secondary | ICD-10-CM

## 2018-12-17 DIAGNOSIS — M6281 Muscle weakness (generalized): Secondary | ICD-10-CM | POA: Diagnosis not present

## 2018-12-17 DIAGNOSIS — R1312 Dysphagia, oropharyngeal phase: Secondary | ICD-10-CM

## 2018-12-17 DIAGNOSIS — Z9181 History of falling: Secondary | ICD-10-CM | POA: Diagnosis not present

## 2018-12-17 DIAGNOSIS — I482 Chronic atrial fibrillation, unspecified: Secondary | ICD-10-CM

## 2018-12-17 NOTE — Progress Notes (Signed)
Location:  [facility] Room Number: 31 Place of Service:  SNF (31) Provider:Ciera Beckum L.MD   Mahlon Gammon, MD  Patient Care Team: Mahlon Gammon, MD as PCP - General (Internal Medicine) Nahser, Deloris Ping, MD as PCP - Cardiology (Cardiology) Mast, Man X, NP as Nurse Practitioner (Internal Medicine)  Extended Emergency Contact Information Primary Emergency Contact: Morgan,Tom Address: [address]          [place], [place] [address] [place] Home Phone: [phone] Relation: Friend Secondary Emergency Contact: Brandy Hale [place] of [place] Home Phone: [phone] Relation: Niece  Code Status:DNR  Goals of care: Advanced Directive information Advanced Directives 12/17/2018  Does Patient Have a Medical Advance Directive? Yes  Type of Advance Directive Out of facility DNR (pink MOST or yellow form);Healthcare Power of Attorney  Does patient want to make changes to medical advance directive? No - Patient declined  Copy of Healthcare Power of Attorney in Chart? Yes - validated most recent copy scanned in chart (See row information)  Would patient like information on creating a medical advance directive? -  Pre-existing out of facility DNR order (yellow form or pink MOST form) Yellow form placed in chart (order not valid for inpatient use)     Chief Complaint  Patient presents with  . Medical Management of Chronic Issues    routine visit     HPI:  Pt is a 85 y.o. female seen today for medical management of chronic diseases.    Patient has h/o Atrial Fibrillation on Eliquis, LE edema, Hypertension, Dysphagia,Essential Tremor, Anemia, Dementia,and depression, Falls Recently Patient had fall and she sustained Bruise all over her face She was sen d to ED and had Negative Imaging for any acute process Has not had any falls since then She walks with Dan Humphreys but is unsteady She also has lost 10 lbs in past 3 months  Dietician and Nurses are working with her. She does not eat usually just takes Shakes Patient did not have any new complains No Other issues per Nurses   Past Medical History:  Diagnosis Date  . Anticoagulant long-term use   . Atrial fibrillation (HCC)   . CHF (congestive heart failure) (HCC)   . Chronic anticoagulation   . Hypertension   . Raynaud phenomenon   . Tremor, essential    Past Surgical History:  Procedure Laterality Date  . CHOLECYSTECTOMY    . TONSILLECTOMY      Allergies  Allergen Reactions  . Fosamax [Alendronate]   . Other Nausea And Vomiting    Green pepper    Outpatient Encounter Medications as of 12/17/2018  Medication Sig  . acetaminophen (TYLENOL) 500 MG tablet Take 500 mg by mouth every 8 (eight) hours as needed for mild pain or moderate pain.   Marland Kitchen acetaminophen (TYLENOL) 500 MG tablet Take 1,000 mg by mouth 2 (two) times daily. Not to exceed 3,00 mg daily.  Marland Kitchen apixaban (ELIQUIS) 2.5 MG TABS tablet Take 2.5 mg by mouth 2 (two) times daily.  . Calcium Carbonate-Vit D-Min (CALTRATE 600+D PLUS MINERALS) 600-800 MG-UNIT CHEW Chew 1 tablet by mouth 2 (two) times a day.  . chlorthalidone (HYGROTON) 25 MG tablet Take 25 mg by mouth daily.   . Glycerin-Hypromellose-PEG 400 (ARTIFICIAL TEARS) 0.2-0.2-1 % SOLN Apply 1 drop to eye 3 (three) times daily.  . mirtazapine (REMERON) 15 MG tablet Take 7.5 mg by mouth at bedtime.  . potassium chloride SA (K-DUR) 20 MEQ tablet Take  40 mEq by mouth daily.  . propranolol ER (INDERAL LA) 80 MG 24 hr capsule TAKE 1 CAPSULE BY MOUTH  DAILY  . sertraline (ZOLOFT) 25 MG tablet Take 25 mg by mouth every evening.    No facility-administered encounter medications on file as of 12/17/2018.     Review of Systems  Unable to perform ROS: Dementia    Immunization History  Administered Date(s) Administered  . Influenza Whole 05/09/2018  . Influenza-Unspecified 05/28/2017  . Pneumococcal Conjugate-13 07/13/2017  . Pneumococcal  Polysaccharide-23 04/07/2002  . Tdap 07/13/2017, 10/08/2017   Pertinent  Health Maintenance Due  Topic Date Due  . INFLUENZA VACCINE  03/08/2019  . DEXA SCAN  Completed  . PNA vac Low Risk Adult  Completed   Fall Risk  10/05/2017  Falls in the past year? No   Functional Status Survey:    Vitals:   12/17/18 1141  BP: 130/72  Pulse: 78  Resp: 18  Temp: 97.6 F (36.4 C)  SpO2: 93%  Weight: 113 lb 1.6 oz (51.3 kg)  Height: 5\' 4"  (1.626 m)   Body mass index is 19.41 kg/m. Physical Exam Vitals signs reviewed.  Constitutional:      Appearance: Normal appearance.  HENT:     Head: Normocephalic.     Comments: Bruises mostly Resolved    Nose: Nose normal.     Mouth/Throat:     Mouth: Mucous membranes are moist.     Pharynx: Oropharynx is clear.  Eyes:     Pupils: Pupils are equal, round, and reactive to light.  Neck:     Musculoskeletal: Neck supple.  Cardiovascular:     Rate and Rhythm: Normal rate. Rhythm irregular.     Pulses: Normal pulses.     Heart sounds: Normal heart sounds.  Pulmonary:     Effort: Pulmonary effort is normal.     Breath sounds: Normal breath sounds.  Abdominal:     General: Abdomen is flat. Bowel sounds are normal.     Palpations: Abdomen is soft.  Musculoskeletal:     Right lower leg: No edema.  Skin:    General: Skin is warm.  Neurological:     General: No focal deficit present.     Mental Status: She is alert.     Comments: Answers Appropriately.. Follows simple Commands. But was not oriented to Time or Place. Did not knew her Age or DOB Walks with the Walker    Psychiatric:        Mood and Affect: Mood normal.        Thought Content: Thought content normal.     Labs reviewed: Recent Labs    01/29/18  04/20/18 1337 04/25/18 07/18/18 11/12/18 11/26/18  NA 141   < > 144 135* 140  --  144  K 3.7   < > 3.3* 3.4 3.6  --  3.9  CL 100   < > 97* 93 98  --   --   CO2 33   < > 35* 35 33  --   --   GLUCOSE  --   --  121*  --   --   --    --   BUN 33*   < > 33* 29* 28* 40* 38*  CREATININE 1.2*   < > 1.24* 1.2* 1.1 1.2* 1.3*  CALCIUM 9.7   < > 10.6* 8.9 9.9  --   --   MG 1.7  --   --   --   --   --   --    < > =  values in this interval not displayed.   Recent Labs    04/17/18 04/20/18 1337 04/25/18 11/26/18  AST ALT 6*  ALKPHOS 37 47 40  --   BILITOT  --  0.7  --   --   PROT 5.6 7.9 5.5  --   ALBUMIN 3.3 4.1 3.2  --    Recent Labs    04/20/18 1337  05/01/18 09/19/18 10/01/18 11/26/18  WBC 12.8*   < > 8.9 7.7 7.5  --   NEUTROABS 10.7*  --   --   --   --   --   HGB 15.5*   < > 12.9 10.4* 11.5*  --   HCT 46.7*   < > 38 31* 34*  --   MCV 91.2  --   --   --   --   --   PLT 269   < > 275 232 206 160   < > = values in this interval not displayed.   Lab Results  Component Value Date   TSH 0.90 07/12/2017   No results found for: HGBA1C No results found for: CHOL, HDL, LDLCALC, LDLDIRECT, TRIG, CHOLHDL  Significant Diagnostic Results in last 30 days:  Ct Head Wo Contrast  Result Date: 11/25/2018 CLINICAL DATA:  Status post fall today with a blow to the head. Initial encounter. EXAM: CT HEAD WITHOUT CONTRAST TECHNIQUE: Contiguous axial images were obtained from the base of the skull through the vertex without intravenous contrast. COMPARISON:  Head CT scan 04/20/2018. FINDINGS: Brain: No evidence of acute infarction, hemorrhage, hydrocephalus, extra-axial collection or mass lesion/mass effect. Atrophy and extensive chronic microvascular ischemic change noted. Vascular: Atherosclerosis is seen. Skull: Intact.  No focal lesion. Sinuses/Orbits: Status post cataract surgery.  Otherwise negative. Other: Hematoma over the right frontal bone noted. IMPRESSION: Subcutaneous hematoma anteriorly on the right without underlying fracture or acute intracranial normality. Cortical atrophy and extensive chronic microvascular ischemic change. Atherosclerosis. Electronically Signed   By: Drusilla Kanner M.D.   On:  11/25/2018 07:53   Ct Cervical Spine Wo Contrast  Result Date: 11/25/2018 CLINICAL DATA:  History of dementia. Status post fall today with a blow to the head. Initial encounter. EXAM: CT CERVICAL SPINE WITHOUT CONTRAST TECHNIQUE: Multidetector CT imaging of the cervical spine was performed without intravenous contrast. Multiplanar CT image reconstructions were also generated. COMPARISON:  Cervical spine CT scan 12/11/2017. FINDINGS: Alignment: Maintained. Skull base and vertebrae: No acute fracture. No primary bone lesion or focal pathologic process. Soft tissues and spinal canal: No prevertebral fluid or swelling. No visible canal hematoma. Disc levels:  Intervertebral disc space height is maintained. Upper chest: Lung apices clear. Other: Extensive atherosclerosis noted. Calcified mediastinal lymph nodes also noted. IMPRESSION: No acute abnormality. Atherosclerosis. Electronically Signed   By: Drusilla Kanner M.D.   On: 11/25/2018 07:57    Assessment/Plan Essential hypertension Stable on Inderal and Chlorthalidone Will decrease her Chlorthalidone to 12.5 mg As she has lost weight I wlll also Decrease her Potassium to 20 meq Chronic a-fib On Eliquis Will consider stopping if she has another Fall Oropharyngeal dysphagia Patient on Thickened Liquid but she is does not like it so drinks thin Liquids Poor Appetite with Loss of weight Will discontinue Zoloft and increase her Remeron to 15 mg to see if it helps her Appetite Tremor, essential Stable On Inderal CKD (chronic kidney disease) stage 3 Creat stable Repeat BMP as changing dose of Chlorthalidone Depression with anxiety Increase  the dose of Remeron and discontinue Zoloft  Dementia with Failure to thrive Continue supportive Care Not Candidate for any Aggressive Testing Poor prognosis  Family/ staff Communication:   Labs/tests ordered:    Total time spent in this patient care encounter was  25_  minutes; greater than 50%  of the visit spent counseling patient and staff, reviewing records , Labs and coordinating care for problems addressed at this encounter.

## 2018-12-19 DIAGNOSIS — R296 Repeated falls: Secondary | ICD-10-CM | POA: Diagnosis not present

## 2018-12-19 DIAGNOSIS — Z9181 History of falling: Secondary | ICD-10-CM | POA: Diagnosis not present

## 2018-12-19 DIAGNOSIS — S22080A Wedge compression fracture of T11-T12 vertebra, initial encounter for closed fracture: Secondary | ICD-10-CM | POA: Diagnosis not present

## 2018-12-19 DIAGNOSIS — M6281 Muscle weakness (generalized): Secondary | ICD-10-CM | POA: Diagnosis not present

## 2018-12-22 DIAGNOSIS — Z9181 History of falling: Secondary | ICD-10-CM | POA: Diagnosis not present

## 2018-12-22 DIAGNOSIS — S22080A Wedge compression fracture of T11-T12 vertebra, initial encounter for closed fracture: Secondary | ICD-10-CM | POA: Diagnosis not present

## 2018-12-22 DIAGNOSIS — R296 Repeated falls: Secondary | ICD-10-CM | POA: Diagnosis not present

## 2018-12-22 DIAGNOSIS — M6281 Muscle weakness (generalized): Secondary | ICD-10-CM | POA: Diagnosis not present

## 2018-12-23 DIAGNOSIS — Z9181 History of falling: Secondary | ICD-10-CM | POA: Diagnosis not present

## 2018-12-23 DIAGNOSIS — R296 Repeated falls: Secondary | ICD-10-CM | POA: Diagnosis not present

## 2018-12-23 DIAGNOSIS — S22080A Wedge compression fracture of T11-T12 vertebra, initial encounter for closed fracture: Secondary | ICD-10-CM | POA: Diagnosis not present

## 2018-12-23 DIAGNOSIS — M6281 Muscle weakness (generalized): Secondary | ICD-10-CM | POA: Diagnosis not present

## 2018-12-25 DIAGNOSIS — Z9181 History of falling: Secondary | ICD-10-CM | POA: Diagnosis not present

## 2018-12-25 DIAGNOSIS — M6281 Muscle weakness (generalized): Secondary | ICD-10-CM | POA: Diagnosis not present

## 2018-12-25 DIAGNOSIS — S22080A Wedge compression fracture of T11-T12 vertebra, initial encounter for closed fracture: Secondary | ICD-10-CM | POA: Diagnosis not present

## 2018-12-25 DIAGNOSIS — R296 Repeated falls: Secondary | ICD-10-CM | POA: Diagnosis not present

## 2018-12-31 DIAGNOSIS — Z9181 History of falling: Secondary | ICD-10-CM | POA: Diagnosis not present

## 2018-12-31 DIAGNOSIS — M6281 Muscle weakness (generalized): Secondary | ICD-10-CM | POA: Diagnosis not present

## 2018-12-31 DIAGNOSIS — R296 Repeated falls: Secondary | ICD-10-CM | POA: Diagnosis not present

## 2018-12-31 DIAGNOSIS — S22080A Wedge compression fracture of T11-T12 vertebra, initial encounter for closed fracture: Secondary | ICD-10-CM | POA: Diagnosis not present

## 2019-01-01 ENCOUNTER — Non-Acute Institutional Stay (SKILLED_NURSING_FACILITY): Payer: Medicare Other | Admitting: Nurse Practitioner

## 2019-01-01 ENCOUNTER — Encounter: Payer: Self-pay | Admitting: Nurse Practitioner

## 2019-01-01 DIAGNOSIS — G309 Alzheimer's disease, unspecified: Secondary | ICD-10-CM

## 2019-01-01 DIAGNOSIS — R269 Unspecified abnormalities of gait and mobility: Secondary | ICD-10-CM

## 2019-01-01 DIAGNOSIS — F0150 Vascular dementia without behavioral disturbance: Secondary | ICD-10-CM | POA: Diagnosis not present

## 2019-01-01 DIAGNOSIS — G250 Essential tremor: Secondary | ICD-10-CM | POA: Diagnosis not present

## 2019-01-01 DIAGNOSIS — S22080A Wedge compression fracture of T11-T12 vertebra, initial encounter for closed fracture: Secondary | ICD-10-CM

## 2019-01-01 DIAGNOSIS — W19XXXA Unspecified fall, initial encounter: Secondary | ICD-10-CM

## 2019-01-01 DIAGNOSIS — I482 Chronic atrial fibrillation, unspecified: Secondary | ICD-10-CM

## 2019-01-01 DIAGNOSIS — F0280 Dementia in other diseases classified elsewhere without behavioral disturbance: Secondary | ICD-10-CM

## 2019-01-01 NOTE — Assessment & Plan Note (Signed)
Lack of safety awareness, unsteady gait, increased frailty are contributory, will need close supervision for safety.

## 2019-01-01 NOTE — Assessment & Plan Note (Signed)
Close supervision, ambulates with walker all the time.

## 2019-01-01 NOTE — Assessment & Plan Note (Signed)
Continue SNF [facility] for safety and care assistance, continue ambulating with walker, risk for falling is related to her lack of safety awareness and unsteady gait.

## 2019-01-01 NOTE — Assessment & Plan Note (Signed)
Heart rate is in control, continue Elliquis for now, may consider dc it if falls frequently.

## 2019-01-01 NOTE — Assessment & Plan Note (Signed)
Stable, fine tremors in fingers, not disabling, continue Propranolol.

## 2019-01-01 NOTE — Assessment & Plan Note (Signed)
Lower back pain is well controlled, continue Tylenol 1000mg  bid.

## 2019-01-01 NOTE — Progress Notes (Signed)
Location:   [facility] Room Number: 31/A Place of Service:  SNF (31) Provider: [facility] Abdul Beirne NP  Mahlon Gammon, MD  Patient Care Team: Mahlon Gammon, MD as PCP - General (Internal Medicine) Nahser, Deloris Ping, MD as PCP - Cardiology (Cardiology) Navarro Nine X, NP as Nurse Practitioner (Internal Medicine)  Extended Emergency Contact Information Primary Emergency Contact: Morgan,Tom Address: [address]          [place], [place] [address] [place] of [place] Home Phone: [phone] Relation: Friend Secondary Emergency Contact: Brandy Hale [place] of [place] Home Phone: [phone] Relation: Niece  Code Status: DNR Goals of care: Advanced Directive information Advanced Directives 01/01/2019  Does Patient Have a Medical Advance Directive? Yes  Type of Estate agent of [place];Out of facility DNR (pink MOST or yellow form)  Does patient want to make changes to medical advance directive? No - Patient declined  Copy of Healthcare Power of Attorney in Chart? Yes - validated most recent copy scanned in chart (See row information)  Would patient like information on creating a medical advance directive? -  Pre-existing out of facility DNR order (yellow form or pink MOST form) Yellow form placed in chart (order not valid for inpatient use)     Chief Complaint  Patient presents with   Acute Visit    FALL    HPI:  Pt is a 85 y.o. female seen today for an acute visit for fell 12/31/18 occurred when the patient walked without walker. She also was reported refusing meds, difficulty of redirecting, resistance to care assistance. Hx of dementia, resides in SNF [facility] for safety and care assistance. Hx of Afib, heart rate is in control, on Eliquis 2.5mg  bid. Fine tremor in fingers, not disabling, on Propranolol 80mg  qd. Lower back pain is controlled on Tylenol 1000mg  bid.    Past Medical History:  Diagnosis Date   Anticoagulant long-term  use    Atrial fibrillation (HCC)    CHF (congestive heart failure) (HCC)    Chronic anticoagulation    Hypertension    Raynaud phenomenon    Tremor, essential    Past Surgical History:  Procedure Laterality Date   CHOLECYSTECTOMY     TONSILLECTOMY      Allergies  Allergen Reactions   Fosamax [Alendronate]    Other Nausea And Vomiting    Green pepper    Allergies as of 01/01/2019      Reactions   Fosamax [alendronate]    Other Nausea And Vomiting   Green pepper      Medication List       Accurate as of Jan 01, 2019  3:14 PM. If you have any questions, ask your nurse or doctor.        STOP taking these medications   sertraline 25 MG tablet Commonly known as:  ZOLOFT Stopped by:  Rameen Gohlke X Kaysee Hergert, NP     TAKE these medications   acetaminophen 500 MG tablet Commonly known as:  TYLENOL Take 500 mg by mouth every 8 (eight) hours as needed for mild pain or moderate pain.   acetaminophen 500 MG tablet Commonly known as:  TYLENOL Take 1,000 mg by mouth 2 (two) times daily. Not to exceed 3,00 mg daily.   Artificial Tears 0.2-0.2-1 % Soln Generic drug:  Glycerin-Hypromellose-PEG 400 Apply 1 drop to eye 3 (three) times daily.   Caltrate 600+D Plus Minerals 600-800 MG-UNIT Chew Chew 1 tablet by mouth 2 (two) times a day.  chlorthalidone 25 MG tablet Commonly known as:  HYGROTON Take 12.5 mg by mouth daily.   Eliquis 2.5 MG Tabs tablet Generic drug:  apixaban Take 2.5 mg by mouth 2 (two) times daily.   mirtazapine 15 MG tablet Commonly known as:  REMERON Take 7.5 mg by mouth at bedtime.   potassium chloride SA 20 MEQ tablet Commonly known as:  K-DUR Take 20 mEq by mouth daily.   propranolol ER 80 MG 24 hr capsule Commonly known as:  INDERAL LA TAKE 1 CAPSULE BY MOUTH  DAILY      ROS was provided with assistance of staff.  Review of Systems  Constitutional: Negative for activity change, appetite change, chills, diaphoresis, fatigue and unexpected  weight change.  HENT: Positive for hearing loss. Negative for congestion and voice change.   Eyes: Negative for visual disturbance.  Respiratory: Negative for cough, shortness of breath and wheezing.   Cardiovascular: Negative for chest pain, palpitations and leg swelling.  Gastrointestinal: Negative for abdominal distention, abdominal pain, constipation, diarrhea, nausea and vomiting.  Genitourinary: Negative for difficulty urinating, dysuria and urgency.  Musculoskeletal: Positive for arthralgias, back pain and gait problem.  Skin: Negative for color change and pallor.  Neurological: Positive for tremors. Negative for dizziness, weakness and headaches.       Dementia  Psychiatric/Behavioral: Positive for confusion. Negative for agitation, behavioral problems, hallucinations and sleep disturbance. The patient is not nervous/anxious.     Immunization History  Administered Date(s) Administered   Influenza Whole 05/09/2018   Influenza-Unspecified 05/28/2017   Pneumococcal Conjugate-13 07/13/2017   Pneumococcal Polysaccharide-23 04/07/2002   Tdap 07/13/2017, 10/08/2017   Pertinent  Health Maintenance Due  Topic Date Due   INFLUENZA VACCINE  03/08/2019   DEXA SCAN  Completed   PNA vac Low Risk Adult  Completed   Fall Risk  10/05/2017  Falls in the past year? No   Functional Status Survey:    Vitals:   01/01/19 1438  BP: 136/70  Pulse: 68  Resp: 20  Temp: (!) 97.4 F (36.3 C)  SpO2: 92%  Weight: 115 lb (52.2 kg)  Height: 5\' 4"  (1.626 m)   Body mass index is 19.74 kg/m. Physical Exam Vitals signs and nursing note reviewed.  Constitutional:      General: She is not in acute distress.    Appearance: Normal appearance. She is not ill-appearing, toxic-appearing or diaphoretic.  HENT:     Head: Normocephalic and atraumatic.     Nose: Nose normal. No congestion or rhinorrhea.     Mouth/Throat:     Mouth: Mucous membranes are moist.  Eyes:     Extraocular Movements:  Extraocular movements intact.     Pupils: Pupils are equal, round, and reactive to light.  Neck:     Musculoskeletal: Normal range of motion and neck supple.  Cardiovascular:     Rate and Rhythm: Normal rate. Rhythm irregular.     Heart sounds: No murmur.  Pulmonary:     Effort: Pulmonary effort is normal.     Breath sounds: No wheezing, rhonchi or rales.  Abdominal:     General: There is no distension.     Palpations: Abdomen is soft.     Tenderness: There is no abdominal tenderness. There is no right CVA tenderness, left CVA tenderness or rebound.  Musculoskeletal:     Right lower leg: No edema.     Left lower leg: No edema.     Comments: Ambulates with walker.   Skin:  General: Skin is warm and dry.  Neurological:     General: No focal deficit present.     Mental Status: She is alert. Mental status is at baseline.     Cranial Nerves: No cranial nerve deficit.     Motor: No weakness.     Coordination: Coordination normal.     Gait: Gait abnormal.     Comments: Oriented to self.   Psychiatric:        Attention and Perception: She does not perceive visual hallucinations.        Mood and Affect: Mood normal. Affect is not angry or tearful.        Speech: Speech normal.        Behavior: Behavior is not agitated, slowed, aggressive or withdrawn.        Thought Content: Thought content is not paranoid or delusional. Thought content does not include suicidal ideation.        Cognition and Memory: Cognition is impaired. Memory is impaired. She exhibits impaired recent memory and impaired remote memory.        Judgment: Judgment is impulsive.     Labs reviewed: Recent Labs    01/29/18  04/20/18 1337 04/25/18 07/18/18 11/12/18 11/26/18  NA 141   < > 144 135* 140  --  144  K 3.7   < > 3.3* 3.4 3.6  --  3.9  CL 100   < > 97* 93 98  --   --   CO2 33   < > 35* 35 33  --   --   GLUCOSE  --   --  121*  --   --   --   --   BUN 33*   < > 33* 29* 28* 40* 38*  CREATININE 1.2*   < >  1.24* 1.2* 1.1 1.2* 1.3*  CALCIUM 9.7   < > 10.6* 8.9 9.9  --   --   MG 1.7  --   --   --   --   --   --    < > = values in this interval not displayed.   Recent Labs    04/17/18 04/20/18 1337 04/25/18 11/26/18  AST ALT 6*  ALKPHOS 37 47 40  --   BILITOT  --  0.7  --   --   PROT 5.6 7.9 5.5  --   ALBUMIN 3.3 4.1 3.2  --    Recent Labs    04/20/18 1337  05/01/18 09/19/18 10/01/18 11/26/18  WBC 12.8*   < > 8.9 7.7 7.5  --   NEUTROABS 10.7*  --   --   --   --   --   HGB 15.5*   < > 12.9 10.4* 11.5*  --   HCT 46.7*   < > 38 31* 34*  --   MCV 91.2  --   --   --   --   --   PLT 269   < > 275 232 206 160   < > = values in this interval not displayed.   Lab Results  Component Value Date   TSH 0.90 07/12/2017   No results found for: HGBA1C No results found for: CHOL, HDL, LDLCALC, LDLDIRECT, TRIG, CHOLHDL  Significant Diagnostic Results in last 30 days:  No results found.  Assessment/Plan: Fall Lack of safety awareness, unsteady gait, increased frailty are contributory, will need close supervision for safety.   Mixed  Alzheimer's and vascular dementia Continue SNF [facility] for safety and care assistance, continue ambulating with walker, risk for falling is related to her lack of safety awareness and unsteady gait.   Chronic a-fib (HCC) Heart rate is in control, continue Elliquis for now, may consider dc it if falls frequently.   Tremor, essential Stable, fine tremors in fingers, not disabling, continue Propranolol.   Gait abnormality Close supervision, ambulates with walker all the time.   Wedge compression fracture of T12 vertebra (HCC) Lower back pain is well controlled, continue Tylenol  bid.     Family/ staff Communication: plan of care reviewed with the patient and charge nurse.   Labs/tests ordered:  None  Time spend 25 minutes.

## 2019-01-02 DIAGNOSIS — R296 Repeated falls: Secondary | ICD-10-CM | POA: Diagnosis not present

## 2019-01-02 DIAGNOSIS — Z9181 History of falling: Secondary | ICD-10-CM | POA: Diagnosis not present

## 2019-01-02 DIAGNOSIS — S22080A Wedge compression fracture of T11-T12 vertebra, initial encounter for closed fracture: Secondary | ICD-10-CM | POA: Diagnosis not present

## 2019-01-02 DIAGNOSIS — M6281 Muscle weakness (generalized): Secondary | ICD-10-CM | POA: Diagnosis not present

## 2019-01-15 ENCOUNTER — Encounter: Payer: Self-pay | Admitting: Nurse Practitioner

## 2019-01-15 ENCOUNTER — Non-Acute Institutional Stay (SKILLED_NURSING_FACILITY): Payer: Medicare Other | Admitting: Nurse Practitioner

## 2019-01-15 DIAGNOSIS — F0150 Vascular dementia without behavioral disturbance: Secondary | ICD-10-CM

## 2019-01-15 DIAGNOSIS — F418 Other specified anxiety disorders: Secondary | ICD-10-CM

## 2019-01-15 DIAGNOSIS — R609 Edema, unspecified: Secondary | ICD-10-CM | POA: Diagnosis not present

## 2019-01-15 DIAGNOSIS — I482 Chronic atrial fibrillation, unspecified: Secondary | ICD-10-CM

## 2019-01-15 DIAGNOSIS — R269 Unspecified abnormalities of gait and mobility: Secondary | ICD-10-CM | POA: Diagnosis not present

## 2019-01-15 DIAGNOSIS — S22080A Wedge compression fracture of T11-T12 vertebra, initial encounter for closed fracture: Secondary | ICD-10-CM

## 2019-01-15 DIAGNOSIS — F0280 Dementia in other diseases classified elsewhere without behavioral disturbance: Secondary | ICD-10-CM

## 2019-01-15 DIAGNOSIS — G250 Essential tremor: Secondary | ICD-10-CM | POA: Diagnosis not present

## 2019-01-15 DIAGNOSIS — G309 Alzheimer's disease, unspecified: Secondary | ICD-10-CM | POA: Diagnosis not present

## 2019-01-15 NOTE — Assessment & Plan Note (Signed)
Her mood and weight are stable, continue Mirtazapine 15mg qd.  

## 2019-01-15 NOTE — Assessment & Plan Note (Signed)
Mild fine tremor in fingers, not disabling, continue Propranolol 80mg  qd.

## 2019-01-15 NOTE — Assessment & Plan Note (Signed)
Heart rate is in control, continue Eliquis 2.5mg bid  

## 2019-01-15 NOTE — Assessment & Plan Note (Signed)
Minimal swelling in ankles, continue Chlorthalidone 12.5mg  qd.

## 2019-01-15 NOTE — Assessment & Plan Note (Signed)
Continue ambulating with walker.  

## 2019-01-15 NOTE — Assessment & Plan Note (Signed)
Continue SNF [facility] for safety and care assistance, ambulates with walker.  

## 2019-01-15 NOTE — Assessment & Plan Note (Signed)
Lower back pain is stable, continue Tylenol 1000mg  bid.

## 2019-01-15 NOTE — Progress Notes (Signed)
Location:  [facility] Room Number: 31 Place of Service:  SNF (31) Provider:  Safir Michalec Johnney OuX Dionicia Cerritos, NP   Mahlon GammonGupta, Anjali L, MD  Patient Care Team: Mahlon GammonGupta, Anjali L, MD as PCP - General (Internal Medicine) Nahser, Deloris PingPhilip J, MD as PCP - Cardiology (Cardiology) Lorin Gawron X, NP as Nurse Practitioner (Internal Medicine)  Extended Emergency Contact Information Primary Emergency Contact: Morgan,Tom Address: [address]          [place], [place] [address] [place] Home Phone: [phone] Relation: Friend Secondary Emergency Contact: Brandy HaleFletcher,Christa  [place] Home Phone: [phone] Relation: Niece  Code Status:  DNR Goals of care: Advanced Directive information Advanced Directives 01/15/2019  Does Patient Have a Medical Advance Directive? Yes  Type of Estate agentAdvance Directive Healthcare Power of LibertyAttorney;Out of facility DNR (pink MOST or yellow form)  Does patient want to make changes to medical advance directive? No - Patient declined  Copy of Healthcare Power of Attorney in Chart? Yes - validated most recent copy scanned in chart (See row information)  Would patient like information on creating a medical advance directive? -  Pre-existing out of facility DNR order (yellow form or pink MOST form) Yellow form placed in chart (order not valid for inpatient use)     Chief Complaint  Patient presents with  . Medical Management of Chronic Issues    Routine Visit     HPI:  Pt is a 85 y.o. female seen today for medical management of chronic diseases.    The patient has history of Afib, heart rate is in control, on Eliquis 2.5mg  bid,  Propranolol 80mg  qd in setting of essential tremor. Her weight and mood are stable, on Mirtazapine 15mg  qd. Edema BLE, stable on Chlorthalidone 12.5mg  qd. Lower back pain is stable on Tylenol 1000mg  bid, ambulates with walker.    Past Medical History:  Diagnosis Date  . Anticoagulant long-term use   . Atrial  fibrillation (HCC)   . CHF (congestive heart failure) (HCC)   . Chronic anticoagulation   . Hypertension   . Raynaud phenomenon   . Tremor, essential    Past Surgical History:  Procedure Laterality Date  . CHOLECYSTECTOMY    . TONSILLECTOMY      Allergies  Allergen Reactions  . Fosamax [Alendronate]   . Other Nausea And Vomiting    Green pepper    Outpatient Encounter Medications as of 01/15/2019  Medication Sig  . acetaminophen (TYLENOL) 500 MG tablet Take 500 mg by mouth every 8 (eight) hours as needed for mild pain or moderate pain.   Marland Kitchen. acetaminophen (TYLENOL) 500 MG tablet Take 1,000 mg by mouth 2 (two) times daily. Not to exceed 3,00 mg daily.  Marland Kitchen. apixaban (ELIQUIS) 2.5 MG TABS tablet Take 2.5 mg by mouth 2 (two) times daily.  . Calcium Carbonate-Vit D-Min (CALTRATE 600+D PLUS MINERALS) 600-800 MG-UNIT CHEW Chew 1 tablet by mouth 2 (two) times a day.  . chlorthalidone (HYGROTON) 25 MG tablet Take 12.5 mg by mouth daily.   . Glycerin-Hypromellose-PEG 400 (ARTIFICIAL TEARS) 0.2-0.2-1 % SOLN Apply 1 drop to eye 3 (three) times daily.  . mirtazapine (REMERON) 15 MG tablet Take 15 mg by mouth at bedtime.   . potassium chloride SA (K-DUR) 20 MEQ tablet Take 20 mEq by mouth daily.   . propranolol ER (INDERAL LA) 80 MG 24 hr capsule TAKE 1 CAPSULE BY MOUTH  DAILY   No facility-administered encounter medications on file as of  01/15/2019.   ROS was provided with assistance of staff.   Review of Systems  Constitutional: Negative for activity change, appetite change, chills, diaphoresis, fatigue, fever and unexpected weight change.  HENT: Positive for hearing loss. Negative for congestion and voice change.   Eyes: Negative for visual disturbance.  Respiratory: Negative for cough, shortness of breath and wheezing.   Cardiovascular: Positive for leg swelling. Negative for chest pain and palpitations.  Gastrointestinal: Negative for abdominal pain, constipation, diarrhea, nausea and  vomiting.  Genitourinary: Negative for difficulty urinating, dysuria and urgency.  Musculoskeletal: Positive for arthralgias, back pain and gait problem.  Skin: Negative for color change and pallor.  Neurological: Positive for tremors. Negative for dizziness, speech difficulty, weakness and headaches.       Dementia  Psychiatric/Behavioral: Negative for agitation, behavioral problems, hallucinations and sleep disturbance. The patient is not nervous/anxious.     Immunization History  Administered Date(s) Administered  . Influenza Whole 05/09/2018  . Influenza-Unspecified 05/28/2017  . Pneumococcal Conjugate-13 07/13/2017  . Pneumococcal Polysaccharide-23 04/07/2002  . Tdap 07/13/2017, 10/08/2017   Pertinent  Health Maintenance Due  Topic Date Due  . INFLUENZA VACCINE  03/08/2019  . DEXA SCAN  Completed  . PNA vac Low Risk Adult  Completed   Fall Risk  10/05/2017  Falls in the past year? No   Functional Status Survey:    Vitals:   01/15/19 0928  BP: 132/70  Pulse: 72  Resp: 18  Temp: 97.7 F (36.5 C)  TempSrc: Oral  SpO2: 90%  Weight: 117 lb 14.4 oz (53.5 kg)  Height: 5\' 4"  (1.626 m)   Body mass index is 20.24 kg/m. Physical Exam Vitals signs and nursing note reviewed.  Constitutional:      General: She is not in acute distress.    Appearance: Normal appearance. She is normal weight. She is not toxic-appearing or diaphoretic.  HENT:     Head: Normocephalic and atraumatic.     Nose: Nose normal.     Mouth/Throat:     Mouth: Mucous membranes are moist.  Eyes:     Extraocular Movements: Extraocular movements intact.     Conjunctiva/sclera: Conjunctivae normal.     Pupils: Pupils are equal, round, and reactive to light.  Neck:     Musculoskeletal: Normal range of motion and neck supple.  Cardiovascular:     Rate and Rhythm: Normal rate. Rhythm irregular.     Heart sounds: No murmur.  Pulmonary:     Effort: Pulmonary effort is normal.     Breath sounds: Rhonchi  present. No wheezing or rales.  Abdominal:     General: Bowel sounds are normal.     Palpations: Abdomen is soft.     Tenderness: There is no abdominal tenderness. There is no right CVA tenderness, left CVA tenderness, guarding or rebound.  Musculoskeletal:     Right lower leg: Edema present.     Left lower leg: Edema present.     Comments: Trace edema in ankles.   Skin:    General: Skin is warm.  Neurological:     General: No focal deficit present.     Mental Status: She is alert. Mental status is at baseline.     Motor: No weakness.     Coordination: Coordination normal.     Gait: Gait abnormal.     Comments: Oriented to self.   Psychiatric:        Mood and Affect: Mood normal.        Behavior: Behavior  normal.     Labs reviewed: Recent Labs    01/29/18  04/20/18 1337 04/25/18 07/18/18 11/12/18 11/26/18  NA 141   < > 144 135* 140  --  144  K 3.7   < > 3.3* 3.4 3.6  --  3.9  CL 100   < > 97* 93 98  --   --   CO2 33   < > 35* 35 33  --   --   GLUCOSE  --   --  121*  --   --   --   --   BUN 33*   < > 33* 29* 28* 40* 38*  CREATININE 1.2*   < > 1.24* 1.2* 1.1 1.2* 1.3*  CALCIUM 9.7   < > 10.6* 8.9 9.9  --   --   MG 1.7  --   --   --   --   --   --    < > = values in this interval not displayed.   Recent Labs    04/17/18 04/20/18 1337 04/25/18 11/26/18  AST 17 24 14 15   ALT 9 16 8  6*  ALKPHOS 37 47 40 38  BILITOT  --  0.7  --   --   PROT 5.6 7.9 5.5  --   ALBUMIN 3.3 4.1 3.2  --    Recent Labs    04/20/18 1337  09/19/18 10/01/18 11/26/18  WBC 12.8*   < > 7.7 7.5 7.8  NEUTROABS 10.7*  --   --   --   --   HGB 15.5*   < > 10.4* 11.5* 13.0  HCT 46.7*   < > 31* 34* 38  MCV 91.2  --   --   --   --   PLT 269   < > 232 206 160   < > = values in this interval not displayed.   Lab Results  Component Value Date   TSH 0.90 07/12/2017    Significant Diagnostic Results in last 30 days:   Assessment/Plan Chronic a-fib (HCC) Heart rate is in control, continue Eliquis  2.5mg  bid.   Mixed Alzheimer's and vascular dementia Continue SNF [facility] for safety and care assistance, ambulates with walker.   Tremor, essential Mild fine tremor in fingers, not disabling, continue Propranolol 80mg  qd.   Wedge compression fracture of T12 vertebra (HCC) Lower back pain is stable, continue Tylenol 1000mg  bid.   Depression with anxiety Her mood and weight are stable, continue Mirtazapine 15mg  qd.   Edema Minimal swelling in ankles, continue Chlorthalidone 12.5mg  qd.   Gait abnormality Continue ambulating with walker.      Family/ staff Communication: plan of care reviewed with the patient and charge nurse.   Labs/tests ordered:  none  Time spend 25 minutes.

## 2019-02-11 ENCOUNTER — Encounter: Payer: Self-pay | Admitting: Internal Medicine

## 2019-02-11 ENCOUNTER — Non-Acute Institutional Stay (SKILLED_NURSING_FACILITY): Payer: Medicare Other | Admitting: Internal Medicine

## 2019-02-11 DIAGNOSIS — F0280 Dementia in other diseases classified elsewhere without behavioral disturbance: Secondary | ICD-10-CM | POA: Diagnosis not present

## 2019-02-11 DIAGNOSIS — I482 Chronic atrial fibrillation, unspecified: Secondary | ICD-10-CM

## 2019-02-11 DIAGNOSIS — G309 Alzheimer's disease, unspecified: Secondary | ICD-10-CM | POA: Diagnosis not present

## 2019-02-11 DIAGNOSIS — R1312 Dysphagia, oropharyngeal phase: Secondary | ICD-10-CM

## 2019-02-11 DIAGNOSIS — F0150 Vascular dementia without behavioral disturbance: Secondary | ICD-10-CM | POA: Diagnosis not present

## 2019-02-11 DIAGNOSIS — I10 Essential (primary) hypertension: Secondary | ICD-10-CM

## 2019-02-11 NOTE — Progress Notes (Signed)
Location:  [facility] Room Number: 31 Place of Service:  SNF (31) Provider:Zamantha Strebel L,MD   Angelica Mejia, Paras Kreider L, MD  Patient Care Team: Angelica Mejia, Adelena Desantiago L, MD as PCP - General (Internal Medicine) Nahser, Angelica PingPhilip J, MD as PCP - Cardiology (Cardiology) Mast, Man X, NP as Nurse Practitioner (Internal Medicine)  Extended Emergency Contact Information Primary Emergency Contact: Angelica Mejia Address: [address]          [place], [place] [address] [place] Home Phone: [phone] Relation: Friend Secondary Emergency Contact: Angelica Mejia  [place] Home Phone: [phone] Relation: Niece  Code Status:DNR  Goals of care: Advanced Directive information Advanced Directives 02/11/2019  Does Patient Have a Medical Advance Directive? Yes  Type of Estate agentAdvance Directive Healthcare Power of DoughertyAttorney;Out of facility DNR (pink MOST or yellow form)  Does patient want to make changes to medical advance directive? No - Patient declined  Copy of Healthcare Power of Attorney in Chart? Yes - validated most recent copy scanned in chart (See row information)  Would patient like information on creating a medical advance directive? -  Pre-existing out of facility DNR order (yellow form or pink MOST form) Yellow form placed in chart (order not valid for inpatient use)     Chief Complaint  Patient presents with  . Medical Management of Chronic Issues    routine visit     HPI:  Pt is a 85 y.o. female seen today for medical management of chronic diseases.    Patient has h/o Atrial Fibrillation on Eliquis, LE edema, Hypertension, Dysphagia,Essential Tremor, Anemia, Dementia,and depression, Falls  She is Long term Resident No New Issues. No recent Falls.Her weight has stabilized some. Her BP is stable and actually has some low Readings Patient unable to give history due to dementia She would resist the exam also Walks with walker but very  Unsteady   Past Medical History:  Diagnosis Date  . Anticoagulant long-term use   . Atrial fibrillation (HCC)   . CHF (congestive heart failure) (HCC)   . Chronic anticoagulation   . Hypertension   . Raynaud phenomenon   . Tremor, essential    Past Surgical History:  Procedure Laterality Date  . CHOLECYSTECTOMY    . TONSILLECTOMY      Allergies  Allergen Reactions  . Fosamax [Alendronate]   . Other Nausea And Vomiting    Green pepper    Outpatient Encounter Medications as of 02/11/2019  Medication Sig  . acetaminophen (TYLENOL) 500 MG tablet Take 500 mg by mouth every 8 (eight) hours as needed for mild pain or moderate pain.   Marland Kitchen. acetaminophen (TYLENOL) 500 MG tablet Take 1,000 mg by mouth 2 (two) times daily. Not to exceed 3,00 mg daily.  Marland Kitchen. apixaban (ELIQUIS) 2.5 MG TABS tablet Take 2.5 mg by mouth 2 (two) times daily.  . Calcium Carbonate-Vit D-Min (CALTRATE 600+D PLUS MINERALS) 600-800 MG-UNIT CHEW Chew 1 tablet by mouth 2 (two) times a day.  . chlorthalidone (HYGROTON) 25 MG tablet Take 12.5 mg by mouth daily.   . Glycerin-Hypromellose-PEG 400 (ARTIFICIAL TEARS) 0.2-0.2-1 % SOLN Apply 1 drop to eye 3 (three) times daily.  . mirtazapine (REMERON) 15 MG tablet Take 15 mg by mouth at bedtime.   . potassium chloride SA (K-DUR) 20 MEQ tablet Take 20 mEq by mouth daily.   . propranolol ER (INDERAL LA) 80 MG 24 hr capsule TAKE 1 CAPSULE BY MOUTH  DAILY   No facility-administered encounter medications  on file as of 02/11/2019.     Review of Systems  Unable to perform ROS: Dementia    Immunization History  Administered Date(s) Administered  . Influenza Whole 05/09/2018  . Influenza-Unspecified 05/28/2017  . Pneumococcal Conjugate-13 07/13/2017  . Pneumococcal Polysaccharide-23 04/07/2002  . Tdap 07/13/2017, 10/08/2017   Pertinent  Health Maintenance Due  Topic Date Due  . INFLUENZA VACCINE  03/08/2019  . DEXA SCAN  Completed  . PNA vac Low Risk Adult  Completed   Fall  Risk  10/05/2017  Falls in the past year? No   Functional Status Survey:    Vitals:   02/11/19 0838  BP: 120/62  Pulse: 78  Resp: 18  Temp: 97.9 F (36.6 C)  SpO2: 97%  Weight: 112 lb 12.8 oz (51.2 kg)  Height: 5\' 4"  (1.626 m)   Body mass index is 19.36 kg/m. Physical Exam Vitals signs reviewed.  Constitutional:      Appearance: Normal appearance.  HENT:     Head: Normocephalic.     Nose: Nose normal.     Mouth/Throat:     Mouth: Mucous membranes are moist.     Pharynx: Oropharynx is clear.  Eyes:     Pupils: Pupils are equal, round, and reactive to light.  Neck:     Musculoskeletal: Neck supple.  Cardiovascular:     Rate and Rhythm: Normal rate.     Pulses: Normal pulses.     Heart sounds: Normal heart sounds.  Pulmonary:     Effort: Pulmonary effort is normal.     Breath sounds: Normal breath sounds.  Abdominal:     General: Abdomen is flat. Bowel sounds are normal.     Palpations: Abdomen is soft.  Musculoskeletal:        General: No swelling.  Skin:    General: Skin is warm and dry.  Neurological:     General: No focal deficit present.     Mental Status: She is alert.     Comments: Was not following Commands Today  Psychiatric:        Mood and Affect: Mood normal.        Thought Content: Thought content normal.        Judgment: Judgment normal.     Labs reviewed: Recent Labs    04/20/18 1337 04/25/18 07/18/18 11/12/18 11/26/18  NA 144 135* 140  --  144  K 3.3* 3.4 3.6  --  3.9  CL 97* 93 98  --   --   CO2 35* 35 33  --   --   GLUCOSE 121*  --   --   --   --   BUN 33* 29* 28* 40* 38*  CREATININE 1.24* 1.2* 1.1 1.2* 1.3*  CALCIUM 10.6* 8.9 9.9  --   --    Recent Labs    04/17/18 04/20/18 1337 04/25/18 11/26/18  AST 17 24 14 15   ALT 9 16 8  6*  ALKPHOS 37 47 40 38  BILITOT  --  0.7  --   --   PROT 5.6 7.9 5.5  --   ALBUMIN 3.3 4.1 3.2  --    Recent Labs    04/20/18 1337  09/19/18 10/01/18 11/26/18  WBC 12.8*   < > 7.7 7.5 7.8   NEUTROABS 10.7*  --   --   --   --   HGB 15.5*   < > 10.4* 11.5* 13.0  HCT 46.7*   < > 31* 34* 38  MCV  91.2  --   --   --   --   PLT 269   < > 232 206 160   < > = values in this interval not displayed.   Lab Results  Component Value Date   TSH 0.90 07/12/2017   No results found for: HGBA1C No results found for: CHOL, HDL, LDLCALC, LDLDIRECT, TRIG, CHOLHDL  Significant Diagnostic Results in last 30 days:  No results found.  Assessment/Plan Essential hypertension Her repeat BMP showed high BUN Will discontinue Chlorthalidone and Potassium Continue to monitor BP  On Inderal Chronic a-fib Rate Controlled On Low dose of Eliquis Oropharyngeal dysphagia Continues on Thin Liquids as does nto drink Thickened Liquis Continue on Remeron Tremor, essential Stable On Inderal CKD (chronic kidney disease) stage3 Creat stable  Depression with anxiety Continue Only Remeron Dementia with Failure to thrive Continue supportive Care Not Candidate for any Aggressive Testing Poor prognosis  Family/ staff Communication:   Labs/tests ordered:    Total time spent in this patient care encounter was  25_  minutes; greater than 50% of the visit spent counseling  staff, reviewing records , Labs and coordinating care for problems addressed at this encounter.

## 2019-02-13 DIAGNOSIS — E876 Hypokalemia: Secondary | ICD-10-CM | POA: Diagnosis not present

## 2019-02-13 LAB — BASIC METABOLIC PANEL
BUN: 43 — AB (ref 4–21)
Creatinine: 1.4 — AB (ref 0.5–1.1)
Glucose: 85
Potassium: 4.3 (ref 3.4–5.3)
Sodium: 147 (ref 137–147)

## 2019-02-15 DIAGNOSIS — Z1159 Encounter for screening for other viral diseases: Secondary | ICD-10-CM | POA: Diagnosis not present

## 2019-02-18 DIAGNOSIS — Z1159 Encounter for screening for other viral diseases: Secondary | ICD-10-CM | POA: Diagnosis not present

## 2019-02-24 DIAGNOSIS — B342 Coronavirus infection, unspecified: Secondary | ICD-10-CM | POA: Diagnosis not present

## 2019-03-05 DIAGNOSIS — Z03818 Encounter for observation for suspected exposure to other biological agents ruled out: Secondary | ICD-10-CM | POA: Diagnosis not present

## 2019-03-10 ENCOUNTER — Other Ambulatory Visit: Payer: Self-pay

## 2019-03-12 ENCOUNTER — Non-Acute Institutional Stay (SKILLED_NURSING_FACILITY): Payer: Medicare Other | Admitting: Nurse Practitioner

## 2019-03-12 ENCOUNTER — Encounter: Payer: Self-pay | Admitting: Nurse Practitioner

## 2019-03-12 DIAGNOSIS — N183 Chronic kidney disease, stage 3 unspecified: Secondary | ICD-10-CM

## 2019-03-12 DIAGNOSIS — F418 Other specified anxiety disorders: Secondary | ICD-10-CM

## 2019-03-12 DIAGNOSIS — R634 Abnormal weight loss: Secondary | ICD-10-CM | POA: Diagnosis not present

## 2019-03-12 DIAGNOSIS — S22080A Wedge compression fracture of T11-T12 vertebra, initial encounter for closed fracture: Secondary | ICD-10-CM

## 2019-03-12 DIAGNOSIS — G250 Essential tremor: Secondary | ICD-10-CM | POA: Diagnosis not present

## 2019-03-12 DIAGNOSIS — F0150 Vascular dementia without behavioral disturbance: Secondary | ICD-10-CM | POA: Diagnosis not present

## 2019-03-12 DIAGNOSIS — I482 Chronic atrial fibrillation, unspecified: Secondary | ICD-10-CM

## 2019-03-12 DIAGNOSIS — G309 Alzheimer's disease, unspecified: Secondary | ICD-10-CM | POA: Diagnosis not present

## 2019-03-12 DIAGNOSIS — Z1159 Encounter for screening for other viral diseases: Secondary | ICD-10-CM | POA: Diagnosis not present

## 2019-03-12 DIAGNOSIS — F0280 Dementia in other diseases classified elsewhere without behavioral disturbance: Secondary | ICD-10-CM | POA: Diagnosis not present

## 2019-03-12 LAB — CHLORIDE
Calcium: 10.9
Carbon Dioxide, Total: 35
Chloride: 105
EGFR (Non-African Amer.): 32

## 2019-03-12 NOTE — Assessment & Plan Note (Signed)
Persisted, continue Mirtazapine, dietary f/u, TSH 0.84 11/12/18

## 2019-03-12 NOTE — Assessment & Plan Note (Signed)
Heart rate is in control, continue Eliquis 2.5mg bid for thromboembolic risk reduction.  

## 2019-03-12 NOTE — Assessment & Plan Note (Signed)
Not disabling, still able to feed self, continue Propranolol 80mg  qd

## 2019-03-12 NOTE — Assessment & Plan Note (Signed)
Remains stable, last creat 1.4 02/13/19

## 2019-03-12 NOTE — Progress Notes (Signed)
Location:  [facility] Room Number: 31 Place of Service:  SNF (31) Provider:  Chipper OmanMast, ManXie  NP  Mahlon GammonGupta, Anjali L, MD  Patient Care Team: Mahlon GammonGupta, Anjali L, MD as PCP - General (Internal Medicine) Nahser, Deloris PingPhilip J, MD as PCP - Cardiology (Cardiology) Yariana Hoaglund X, NP as Nurse Practitioner (Internal Medicine)  Extended Emergency Contact Information Primary Emergency Contact: Morgan,Tom Address: [address]          [place], [place] [address] [place] Home Phone: [phone] Relation: Friend Secondary Emergency Contact: Fletcher,Christa  [place] Home Phone: [phone] Relation: Niece  Code Status:  DNR Goals of care: Advanced Directive information Advanced Directives 03/12/2019  Does Patient Have a Medical Advance Directive? Yes  Type of Estate agentAdvance Directive Healthcare Power of [place];Out of facility DNR (pink MOST or yellow form);Living will  Does patient want to make changes to medical advance directive? No - Patient declined  Copy of Healthcare Power of Attorney in Chart? Yes - validated most recent copy scanned in chart (See row information)  Would patient like information on creating a medical advance directive? -  Pre-existing out of facility DNR order (yellow form or pink MOST form) Yellow form placed in chart (order not valid for inpatient use)     Chief Complaint  Patient presents with  . Medical Management of Chronic Issues    HPI:  Pt is a 85 y.o. female seen today for medical management of chronic diseases.     [facility] and care [facility], ambulates with walker. Hx of Afib, heart rate is in control, taking Eliquis 2.5mg  bid for thromboembolic risk reduction. Right hand intentional tremor, on Propranolol 80mg  qd. Weight loss #4-5Ibs in the pas 3 months, on Mirtazapine 15mg  qd, she denied abd pain, nausea, vomiting, indigestion, constipation, or diarrhea. CKD, creat 1.4  02/13/19. Lower back pain is controlled on Tylenol 1000mg  bid .  Past Medical History:  Diagnosis Date  . Anticoagulant long-term use   . Atrial fibrillation (HCC)   . CHF (congestive heart failure) (HCC)   . Chronic anticoagulation   . Hypertension   . Raynaud phenomenon   . Tremor, essential    Past Surgical History:  Procedure Laterality Date  . CHOLECYSTECTOMY    . TONSILLECTOMY      Allergies  Allergen Reactions  . Fosamax [Alendronate]   . Other Nausea And Vomiting    Green pepper    Outpatient Encounter Medications as of 03/12/2019  Medication Sig  . acetaminophen (TYLENOL) 500 MG tablet Take 500 mg by mouth every 8 (eight) hours as needed for mild pain or moderate pain.   Marland Kitchen. acetaminophen (TYLENOL) 500 MG tablet Take 1,000 mg by mouth 2 (two) times daily. Not to exceed 3,00 mg daily.  Marland Kitchen. apixaban (ELIQUIS) 2.5 MG TABS tablet Take 2.5 mg by mouth 2 (two) times daily.  . Calcium Carbonate-Vit D-Min (CALTRATE 600+D PLUS MINERALS) 600-800 MG-UNIT CHEW Chew 1 tablet by mouth 2 (two) times a day.  . feeding supplement (BOOST / RESOURCE BREEZE) LIQD Take 1 Container by mouth 2 (two) times daily.  . Glycerin-Hypromellose-PEG 400 (ARTIFICIAL TEARS) 0.2-0.2-1 % SOLN Apply 1 drop to eye 3 (three) times daily.  . mirtazapine (REMERON) 15 MG tablet Take 15 mg by mouth at bedtime.   . propranolol ER (INDERAL LA) 80 MG 24 hr capsule TAKE 1 CAPSULE BY MOUTH  DAILY  . [DISCONTINUED] chlorthalidone (HYGROTON) 25 MG tablet  Take 12.5 mg by mouth daily.   . [DISCONTINUED] potassium chloride SA (K-DUR) 20 MEQ tablet Take 20 mEq by mouth daily.    No facility-administered encounter medications on file as of 03/12/2019.    ROS was provided with assistance of staff Review of Systems  Constitutional: Positive for unexpected weight change. Negative for activity change, appetite change, chills, diaphoresis, fatigue and fever.       Weight loss #4-5Ibs in the past 3-60months.   HENT: Positive for  hearing loss. Negative for congestion and voice change.   Respiratory: Negative for cough, shortness of breath and wheezing.   Cardiovascular: Negative for chest pain and leg swelling.  Gastrointestinal: Negative for abdominal distention, abdominal pain, constipation, diarrhea, nausea and vomiting.  Genitourinary: Negative for difficulty urinating, dysuria and urgency.  Musculoskeletal: Positive for back pain and gait problem.  Skin: Negative for color change and pallor.  Neurological: Positive for tremors. Negative for dizziness, speech difficulty and headaches.       Dementia  Psychiatric/Behavioral: Negative for agitation, behavioral problems, hallucinations and sleep disturbance. The patient is not nervous/anxious.     Immunization History  Administered Date(s) Administered  . Influenza Whole 05/09/2018  . Influenza-Unspecified 05/28/2017  . Pneumococcal Conjugate-13 07/13/2017  . Pneumococcal Polysaccharide-23 04/07/2002  . Tdap 07/13/2017, 10/08/2017   Pertinent  Health Maintenance Due  Topic Date Due  . INFLUENZA VACCINE  03/08/2019  . DEXA SCAN  Completed  . PNA vac Low Risk Adult  Completed   Fall Risk  10/05/2017  Falls in the past year? No   Functional Status Survey:    Vitals:   03/12/19 0955  BP: 112/60  Pulse: 78  Resp: 18  Temp: (!) 97.1 F (36.2 C)  SpO2: 91%  Weight: 109 lb 1.6 oz (49.5 kg)  Height: 5\' 4"  (1.626 m)   Body mass index is 18.73 kg/m. Physical Exam Constitutional:      General: She is not in acute distress.    Appearance: Normal appearance. She is normal weight. She is not ill-appearing, toxic-appearing or diaphoretic.  HENT:     Head: Normocephalic and atraumatic.     Nose: Nose normal.     Mouth/Throat:     Mouth: Mucous membranes are moist.  Eyes:     Extraocular Movements: Extraocular movements intact.     Conjunctiva/sclera: Conjunctivae normal.     Pupils: Pupils are equal, round, and reactive to light.  Neck:      Musculoskeletal: Normal range of motion and neck supple.  Cardiovascular:     Rate and Rhythm: Normal rate. Rhythm irregular.     Heart sounds: No murmur.  Pulmonary:     Effort: Pulmonary effort is normal.     Breath sounds: No wheezing, rhonchi or rales.  Chest:     Chest wall: No tenderness.  Abdominal:     Palpations: Abdomen is soft.     Tenderness: There is no abdominal tenderness. There is no right CVA tenderness, guarding or rebound.  Musculoskeletal:        General: No swelling, deformity or signs of injury.     Right lower leg: No edema.     Left lower leg: No edema.  Skin:    General: Skin is warm and dry.  Neurological:     General: No focal deficit present.     Mental Status: She is alert. Mental status is at baseline.     Cranial Nerves: No cranial nerve deficit.     Motor: No weakness.  Coordination: Coordination abnormal.     Gait: Gait abnormal.     Comments: Intentional tremor right hand. Oriented to self  Psychiatric:        Mood and Affect: Mood normal.        Behavior: Behavior normal.     Labs reviewed: Recent Labs    04/20/18 1337 04/25/18 07/18/18 11/12/18 11/26/18 02/13/19  NA 144 135* 140  --  144 147  K 3.3* 3.4 3.6  --  3.9 4.3  CL 97* 93 98  --   --  105  CO2 35* 35 33  --   --  35  GLUCOSE 121*  --   --   --   --   --   BUN 33* 29* 28* 40* 38* 43*  CREATININE 1.24* 1.2* 1.1 1.2* 1.3* 1.4*  CALCIUM 10.6* 8.9 9.9  --   --  10.9   Recent Labs    04/17/18 04/20/18 1337 04/25/18 11/26/18  AST 17 24 14 15   ALT 9 16 8  6*  ALKPHOS 37 47 40 38  BILITOT  --  0.7  --   --   PROT 5.6 7.9 5.5  --   ALBUMIN 3.3 4.1 3.2  --    Recent Labs    04/20/18 1337  09/19/18 10/01/18 11/26/18  WBC 12.8*   < > 7.7 7.5 7.8  NEUTROABS 10.7*  --   --   --   --   HGB 15.5*   < > 10.4* 11.5* 13.0  HCT 46.7*   < > 31* 34* 38  MCV 91.2  --   --   --   --   PLT 269   < > 232 206 160   < > = values in this interval not displayed.   Lab Results   Component Value Date   TSH 0.90 07/12/2017   No results found for: HGBA1C No results found for: CHOL, HDL, LDLCALC, LDLDIRECT, TRIG, CHOLHDL  Significant Diagnostic Results in last 30 days:  No results found.  Assessment/Plan Chronic a-fib (HCC) Heart rate is in control, continue Eliquis 2.5mg  bid for thromboembolic risk reduction  Mixed Alzheimer's and vascular dementia Continue SNF [facility] for safety and care assistance.   Tremor, essential Not disabling, still able to feed self, continue Propranolol 80mg  qd  Wedge compression fracture of T12 vertebra (HCC) Back pain is controlled, continue Tylenol 1000mg  bid  CKD (chronic kidney disease) stage 3, GFR 30-59 ml/min (HCC) Remains stable, last creat 1.4 02/13/19  Depression with anxiety Her mood is stable, continue Mirtazapine 15mg  qd  Weight loss Persisted, continue Mirtazapine, dietary f/u, TSH 0.84 11/12/18     Family/ staff Communication: plan of care reviewed with the patient and charge nurse.   Labs/tests ordered:  none  Time spend 25 minutes.

## 2019-03-12 NOTE — Assessment & Plan Note (Signed)
Her mood is stable, continue  Mirtazapine 15mg qd. 

## 2019-03-12 NOTE — Assessment & Plan Note (Signed)
Continue [facility] for safety and care assistance.  

## 2019-03-12 NOTE — Assessment & Plan Note (Signed)
Back pain is controlled, continue Tylenol 1000mg  bid

## 2019-03-13 DIAGNOSIS — E876 Hypokalemia: Secondary | ICD-10-CM | POA: Diagnosis not present

## 2019-03-13 LAB — BASIC METABOLIC PANEL
BUN: 28 — AB (ref 4–21)
Creatinine: 1.3 — AB (ref 0.5–1.1)
Glucose: 75
Potassium: 3.9 (ref 3.4–5.3)
Sodium: 144 (ref 137–147)

## 2019-03-19 DIAGNOSIS — Z1159 Encounter for screening for other viral diseases: Secondary | ICD-10-CM | POA: Diagnosis not present

## 2019-04-07 ENCOUNTER — Encounter: Payer: Self-pay | Admitting: Nurse Practitioner

## 2019-04-07 ENCOUNTER — Non-Acute Institutional Stay (SKILLED_NURSING_FACILITY): Payer: Medicare Other | Admitting: Nurse Practitioner

## 2019-04-07 DIAGNOSIS — K5901 Slow transit constipation: Secondary | ICD-10-CM | POA: Insufficient documentation

## 2019-04-07 DIAGNOSIS — F0280 Dementia in other diseases classified elsewhere without behavioral disturbance: Secondary | ICD-10-CM

## 2019-04-07 DIAGNOSIS — I482 Chronic atrial fibrillation, unspecified: Secondary | ICD-10-CM

## 2019-04-07 DIAGNOSIS — G309 Alzheimer's disease, unspecified: Secondary | ICD-10-CM

## 2019-04-07 DIAGNOSIS — R269 Unspecified abnormalities of gait and mobility: Secondary | ICD-10-CM | POA: Diagnosis not present

## 2019-04-07 DIAGNOSIS — S22080A Wedge compression fracture of T11-T12 vertebra, initial encounter for closed fracture: Secondary | ICD-10-CM

## 2019-04-07 DIAGNOSIS — W19XXXA Unspecified fall, initial encounter: Secondary | ICD-10-CM | POA: Diagnosis not present

## 2019-04-07 DIAGNOSIS — F0150 Vascular dementia without behavioral disturbance: Secondary | ICD-10-CM | POA: Diagnosis not present

## 2019-04-07 NOTE — Progress Notes (Signed)
Location:  [facility] Room Number: 31A Place of Service:  SNF (31) Provider:  Taiyana Kissler X Joaquim Tolen. NP   Mahlon Gammon, MD  Patient Care Team: Mahlon Gammon, MD as PCP - General (Internal Medicine) Nahser, Deloris Ping, MD as PCP - Cardiology (Cardiology) Caesar Mannella X, NP as Nurse Practitioner (Internal Medicine)  Extended Emergency Contact Information Primary Emergency Contact: Morgan,Tom Address: [address]          [place], [place] [address] [place] Home Phone: [phone] Relation: Friend Secondary Emergency Contact: Brandy [place] Home Phone: [phone] Relation: Niece  Code Status:DNR  Goals of care: Advanced Directive information Advanced Directives 04/07/2019  Does Patient Have a Medical Advance Directive? Yes  Type of Estate agent of [place];Out of facility DNR (pink MOST or yellow form)  Does patient want to make changes to medical advance directive? No - Patient declined  Copy of Healthcare Power of Attorney in Chart? Yes - validated most recent copy scanned in chart (See row information)  Would patient like information on creating a medical advance directive? -  Pre-existing out of facility DNR order (yellow form or pink MOST form) Yellow form placed in chart (order not valid for inpatient use)     Chief Complaint  Patient presents with  . Acute Visit    Fall     HPI:  Pt is a 85 y.o. female seen today for an acute visit for 04/06/19 fall w/o injury, found on floor in semi sitting position. She resides in [facility] for safety, care assistance, ambulates with walker. Hx Afib, heart rate is in control, on Eliquis 2.5mg  bid for thromboembolic risk reduction. Lower back pain, controlled on Tylenol 1000mg  bid.    Past Medical History:  Diagnosis Date  . Anticoagulant long-term use   . Atrial fibrillation (HCC)   . CHF (congestive heart failure) (HCC)   . Chronic  anticoagulation   . Hypertension   . Raynaud phenomenon   . Tremor, essential    Past Surgical History:  Procedure Laterality Date  . CHOLECYSTECTOMY    . TONSILLECTOMY      Allergies  Allergen Reactions  . Fosamax [Alendronate]   . Other Nausea And Vomiting    Green pepper    Outpatient Encounter Medications as of 04/07/2019  Medication Sig  . acetaminophen (TYLENOL) 500 MG tablet Take 500 mg by mouth every 8 (eight) hours as needed for mild pain or moderate pain.   Marland Kitchen acetaminophen (TYLENOL) 500 MG tablet Take 1,000 mg by mouth 2 (two) times daily. Not to exceed 3,00 mg daily.  Marland Kitchen apixaban (ELIQUIS) 2.5 MG TABS tablet Take 2.5 mg by mouth 2 (two) times daily.  . Calcium Carbonate-Vit D-Min (CALTRATE 600+D PLUS MINERALS) 600-800 MG-UNIT CHEW Chew 1 tablet by mouth 2 (two) times a day.  . feeding supplement (BOOST / RESOURCE BREEZE) LIQD Take 1 Container by mouth 2 (two) times daily.  . Glycerin-Hypromellose-PEG 400 (ARTIFICIAL TEARS) 0.2-0.2-1 % SOLN Apply 1 drop to eye 3 (three) times daily.  . mirtazapine (REMERON) 15 MG tablet Take 15 mg by mouth at bedtime.   . propranolol ER (INDERAL LA) 80 MG 24 hr capsule TAKE 1 CAPSULE BY MOUTH  DAILY   No facility-administered encounter medications on file as of 04/07/2019.    ROS was provided with assistance of staff Review of Systems  Constitutional: Negative for activity change, chills, diaphoresis, fatigue and fever.  HENT: Positive  for hearing loss. Negative for congestion and voice change.   Eyes: Negative for visual disturbance.  Respiratory: Negative for cough, shortness of breath and wheezing.   Cardiovascular: Negative for chest pain, palpitations and leg swelling.  Gastrointestinal: Negative for abdominal distention, abdominal pain, constipation, diarrhea, nausea and vomiting.  Genitourinary: Negative for difficulty urinating, dysuria and urgency.  Musculoskeletal: Positive for back pain and gait problem.  Skin: Negative for  color change and pallor.  Neurological: Positive for tremors. Negative for dizziness, facial asymmetry, speech difficulty, weakness and headaches.       Dementia  Psychiatric/Behavioral: Positive for confusion. Negative for agitation, behavioral problems, hallucinations and sleep disturbance. The patient is not nervous/anxious.     Immunization History  Administered Date(s) Administered  . Influenza Whole 05/09/2018  . Influenza-Unspecified 05/28/2017  . Pneumococcal Conjugate-13 07/13/2017  . Pneumococcal Polysaccharide-23 04/07/2002  . Tdap 07/13/2017, 10/08/2017   Pertinent  Health Maintenance Due  Topic Date Due  . INFLUENZA VACCINE  03/08/2019  . DEXA SCAN  Completed  . PNA vac Low Risk Adult  Completed   Fall Risk  10/05/2017  Falls in the past year? No   Functional Status Survey:    Vitals:   04/07/19 1703  BP: 130/80  Pulse: 72  Resp: 20  Temp: (!) 96.9 F (36.1 C)  TempSrc: Oral  SpO2: 92%  Weight: 111 lb 3.2 oz (50.4 kg)  Height: 5\' 4"  (1.626 m)   Body mass index is 19.09 kg/m. Physical Exam Constitutional:      General: She is not in acute distress.    Appearance: Normal appearance. She is not ill-appearing, toxic-appearing or diaphoretic.  HENT:     Head: Normocephalic and atraumatic.     Nose: Nose normal.     Mouth/Throat:     Mouth: Mucous membranes are moist.  Eyes:     Extraocular Movements: Extraocular movements intact.     Conjunctiva/sclera: Conjunctivae normal.     Pupils: Pupils are equal, round, and reactive to light.  Neck:     Musculoskeletal: Normal range of motion and neck supple.  Cardiovascular:     Rate and Rhythm: Normal rate and regular rhythm.     Heart sounds: No murmur.  Pulmonary:     Effort: Pulmonary effort is normal.     Breath sounds: No wheezing, rhonchi or rales.  Chest:     Chest wall: No tenderness.  Abdominal:     Palpations: Abdomen is soft.     Tenderness: There is no abdominal tenderness. There is no right  CVA tenderness, left CVA tenderness, guarding or rebound.  Musculoskeletal:        General: No deformity.     Right lower leg: No edema.     Left lower leg: No edema.     Comments: Ambulates with walker.   Skin:    General: Skin is warm and dry.  Neurological:     General: No focal deficit present.     Mental Status: She is alert. Mental status is at baseline.     Motor: No weakness.     Coordination: Coordination abnormal.     Gait: Gait abnormal.     Comments: Oriented to self. Right hand resting tremor.   Psychiatric:        Behavior: Behavior normal.     Labs reviewed: Recent Labs    04/20/18 1337 04/25/18 07/18/18  11/26/18 02/13/19 03/13/19  NA 144 135* 140  --  144 147 144  K 3.3* 3.4  3.6  --  3.9 4.3 3.9  CL 97* 93 98  --   --  105  --   CO2 35* 35 33  --   --  35  --   GLUCOSE 121*  --   --   --   --   --   --   BUN 33* 29* 28*   < > 38* 43* 28*  CREATININE 1.24* 1.2* 1.1   < > 1.3* 1.4* 1.3*  CALCIUM 10.6* 8.9 9.9  --   --  10.9  --    < > = values in this interval not displayed.   Recent Labs    04/17/18 04/20/18 1337 04/25/18 11/26/18  AST 17 24 14 15   ALT 9 16 8  6*  ALKPHOS 37 47 40 38  BILITOT  --  0.7  --   --   PROT 5.6 7.9 5.5  --   ALBUMIN 3.3 4.1 3.2  --    Recent Labs    04/20/18 1337  09/19/18 10/01/18 11/26/18  WBC 12.8*   < > 7.7 7.5 7.8  NEUTROABS 10.7*  --   --   --   --   HGB 15.5*   < > 10.4* 11.5* 13.0  HCT 46.7*   < > 31* 34* 38  MCV 91.2  --   --   --   --   PLT 269   < > 232 206 160   < > = values in this interval not displayed.   Lab Results  Component Value Date   TSH 0.90 07/12/2017    Significant Diagnostic Results in last 30 days:   Assessment/Plan Fall 04/06/19 fall w/o injury, found on floor in semi sitting position. Lack of safety awareness, ambulates with walker, increased frailty are contributory, close supervision for safety.   Mixed Alzheimer's and vascular dementia Continue SNF [facility] for safety, care  assistance, close supervision for safety.   Chronic a-fib (HCC) Heart rate is in control, continue Eliquis 2.5mg  bid, may have to discontinue it if fall became frequent, R>B  Gait abnormality Ambulates with walker.   Wedge compression fracture of T12 vertebra (HCC) Lower back pain, chronic, managed, continue Tylenol 1000mg  bid.      Family/ staff Communication: plan of care reviewed with the patient and charge nurse.   Labs/tests ordered: none  Time spend 25 minutes.

## 2019-04-08 ENCOUNTER — Encounter: Payer: Self-pay | Admitting: Nurse Practitioner

## 2019-04-08 NOTE — Assessment & Plan Note (Signed)
Ambulates with walker.  

## 2019-04-08 NOTE — Assessment & Plan Note (Signed)
Lower back pain, chronic, managed, continue Tylenol 1000mg  bid.

## 2019-04-08 NOTE — Assessment & Plan Note (Signed)
04/06/19 fall w/o injury, found on floor in semi sitting position. Lack of safety awareness, ambulates with walker, increased frailty are contributory, close supervision for safety.

## 2019-04-08 NOTE — Assessment & Plan Note (Signed)
Continue SNF [facility] for safety, care assistance, close supervision for safety.

## 2019-04-08 NOTE — Assessment & Plan Note (Signed)
Heart rate is in control, continue Eliquis 2.5mg  bid, may have to discontinue it if fall became frequent, R>B

## 2019-04-09 ENCOUNTER — Non-Acute Institutional Stay (SKILLED_NURSING_FACILITY): Payer: Medicare Other | Admitting: Nurse Practitioner

## 2019-04-09 ENCOUNTER — Encounter: Payer: Self-pay | Admitting: Nurse Practitioner

## 2019-04-09 DIAGNOSIS — I482 Chronic atrial fibrillation, unspecified: Secondary | ICD-10-CM | POA: Diagnosis not present

## 2019-04-09 DIAGNOSIS — H0100B Unspecified blepharitis left eye, upper and lower eyelids: Secondary | ICD-10-CM | POA: Diagnosis not present

## 2019-04-09 DIAGNOSIS — F418 Other specified anxiety disorders: Secondary | ICD-10-CM | POA: Diagnosis not present

## 2019-04-09 DIAGNOSIS — F0280 Dementia in other diseases classified elsewhere without behavioral disturbance: Secondary | ICD-10-CM | POA: Diagnosis not present

## 2019-04-09 DIAGNOSIS — G250 Essential tremor: Secondary | ICD-10-CM

## 2019-04-09 DIAGNOSIS — K5901 Slow transit constipation: Secondary | ICD-10-CM | POA: Diagnosis not present

## 2019-04-09 DIAGNOSIS — H01003 Unspecified blepharitis right eye, unspecified eyelid: Secondary | ICD-10-CM | POA: Insufficient documentation

## 2019-04-09 DIAGNOSIS — G309 Alzheimer's disease, unspecified: Secondary | ICD-10-CM

## 2019-04-09 DIAGNOSIS — F0150 Vascular dementia without behavioral disturbance: Secondary | ICD-10-CM

## 2019-04-09 DIAGNOSIS — S22080A Wedge compression fracture of T11-T12 vertebra, initial encounter for closed fracture: Secondary | ICD-10-CM

## 2019-04-09 DIAGNOSIS — R634 Abnormal weight loss: Secondary | ICD-10-CM

## 2019-04-09 DIAGNOSIS — H0100A Unspecified blepharitis right eye, upper and lower eyelids: Secondary | ICD-10-CM | POA: Diagnosis not present

## 2019-04-09 NOTE — Progress Notes (Signed)
Location:  [facility] Room Number: 31 Place of Service:  SNF (31) Provider:  Chipper OmanMast, ManXie  NP  Mahlon GammonGupta, Anjali L, MD  Patient Care Team: Mahlon GammonGupta, Anjali L, MD as PCP - General (Internal Medicine) Nahser, Deloris PingPhilip J, MD as PCP - Cardiology (Cardiology) Tonika Eden X, NP as Nurse Practitioner (Internal Medicine)  Extended Emergency Contact Information Primary Emergency Contact: Morgan,Tom Address: [address]          [place], [place] [address] [place] Home Phone: [phone] Relation: Friend Secondary Emergency Contact: Brandy HaleFletcher,Christa  [place] Home Phone: [phone] Relation: Niece  Code Status:  DNR Goals of care: Advanced Directive information Advanced Directives 04/09/2019  Does Patient Have a Medical Advance Directive? Yes  Type of Estate agentAdvance Directive Healthcare Power of EncinalAttorney;Living will;Out of facility DNR (pink MOST or yellow form)  Does patient want to make changes to medical advance directive? No - Patient declined  Copy of Healthcare Power of Attorney in Chart? Yes - validated most recent copy scanned in chart (See row information)  Would patient like information on creating a medical advance directive? -  Pre-existing out of facility DNR order (yellow form or pink MOST form) Yellow form placed in chart (order not valid for inpatient use)     Chief Complaint  Patient presents with  . Medical Management of Chronic Issues    HPI:  Pt is a 85 y.o. female seen today for medical management of chronic diseases.    [facility], care assistance, ambulates with walker. Hx of Afib, heart rate is in control, on  Propranolol 960m gd, Eliquis 2.5mg  bid. Her mood/weight are stable, on Mirtazapine 15mg  qd. Hx of constipation, stable on Senokot S I qhs. Lower back pain, controlled, on Tylenol 1000mg  bid.    Past Medical History:  Diagnosis Date  . Anticoagulant long-term use   . Atrial  fibrillation (HCC)   . CHF (congestive heart failure) (HCC)   . Chronic anticoagulation   . Hypertension   . Raynaud phenomenon   . Tremor, essential    Past Surgical History:  Procedure Laterality Date  . CHOLECYSTECTOMY    . TONSILLECTOMY      Allergies  Allergen Reactions  . Fosamax [Alendronate]   . Other Nausea And Vomiting    Green pepper    Outpatient Encounter Medications as of 04/09/2019  Medication Sig  . acetaminophen (TYLENOL) 500 MG tablet Take 500 mg by mouth every 8 (eight) hours as needed for mild pain or moderate pain.   Marland Kitchen. acetaminophen (TYLENOL) 500 MG tablet Take 1,000 mg by mouth 2 (two) times daily. Not to exceed 3,00 mg daily.  Marland Kitchen. apixaban (ELIQUIS) 2.5 MG TABS tablet Take 2.5 mg by mouth 2 (two) times daily.  . Calcium Carbonate-Vit D-Min (CALTRATE 600+D PLUS MINERALS) 600-800 MG-UNIT CHEW Chew 1 tablet by mouth 2 (two) times a day.  . feeding supplement (BOOST / RESOURCE BREEZE) LIQD Take 1 Container by mouth 2 (two) times daily. 120 ml  . Glycerin-Hypromellose-PEG 400 (ARTIFICIAL TEARS) 0.2-0.2-1 % SOLN Apply 1 drop to eye 3 (three) times daily.  . mirtazapine (REMERON) 15 MG tablet Take 15 mg by mouth at bedtime.   . propranolol ER (INDERAL LA) 80 MG 24 hr capsule TAKE 1 CAPSULE BY MOUTH  DAILY  . sennosides-docusate sodium (SENOKOT-S) 8.6-50 MG tablet Take 2 tablets by mouth at bedtime.    No facility-administered encounter medications on file as  of 04/09/2019.    ROS was provided with assistance of staff.  Review of Systems  Constitutional: Negative for activity change, appetite change, chills, diaphoresis, fatigue, fever and unexpected weight change.  HENT: Positive for hearing loss. Negative for congestion and voice change.   Eyes: Negative for visual disturbance.       Crusty eyelashes.   Respiratory: Negative for cough, shortness of breath and wheezing.   Cardiovascular: Negative for chest pain, palpitations and leg swelling.  Gastrointestinal:  Negative for abdominal distention, abdominal pain, constipation, diarrhea, nausea and vomiting.  Genitourinary: Negative for difficulty urinating, dysuria and urgency.  Musculoskeletal: Positive for back pain and gait problem.  Skin: Negative for color change and pallor.  Neurological: Positive for tremors. Negative for dizziness, speech difficulty, weakness and headaches.       Dementia.   Psychiatric/Behavioral: Negative for agitation, behavioral problems, hallucinations and sleep disturbance. The patient is not nervous/anxious.     Immunization History  Administered Date(s) Administered  . Influenza Whole 05/09/2018  . Influenza-Unspecified 05/28/2017  . Pneumococcal Conjugate-13 07/13/2017  . Pneumococcal Polysaccharide-23 04/07/2002  . Tdap 07/13/2017, 10/08/2017   Pertinent  Health Maintenance Due  Topic Date Due  . INFLUENZA VACCINE  03/08/2019  . DEXA SCAN  Completed  . PNA vac Low Risk Adult  Completed   Fall Risk  10/05/2017  Falls in the past year? No   Functional Status Survey:    Vitals:   04/09/19 0852  BP: 136/70  Pulse: 84  Resp: 18  Temp: (!) 97 F (36.1 C)  SpO2: 91%  Weight: 111 lb 3.2 oz (50.4 kg)  Height: 5\' 4"  (1.626 m)   Body mass index is 19.09 kg/m. Physical Exam Vitals signs and nursing note reviewed.  Constitutional:      General: She is not in acute distress.    Appearance: Normal appearance. She is normal weight. She is not ill-appearing, toxic-appearing or diaphoretic.  HENT:     Head: Normocephalic and atraumatic.     Nose: Nose normal.     Mouth/Throat:     Mouth: Mucous membranes are moist.  Eyes:     Extraocular Movements: Extraocular movements intact.     Conjunctiva/sclera: Conjunctivae normal.     Pupils: Pupils are equal, round, and reactive to light.     Comments: Crusted eye lashes, L>R  Neck:     Musculoskeletal: Normal range of motion and neck supple.  Cardiovascular:     Rate and Rhythm: Normal rate and regular  rhythm.     Heart sounds: No murmur.  Pulmonary:     Breath sounds: No wheezing, rhonchi or rales.  Abdominal:     General: Bowel sounds are normal.     Palpations: Abdomen is soft.     Tenderness: There is no abdominal tenderness. There is no right CVA tenderness, left CVA tenderness or guarding.  Musculoskeletal:     Right lower leg: No edema.     Left lower leg: No edema.     Comments: Ambulates with walker.   Skin:    General: Skin is warm and dry.  Neurological:     General: No focal deficit present.     Mental Status: She is alert. Mental status is at baseline.     Cranial Nerves: No cranial nerve deficit.     Motor: No weakness.     Coordination: Coordination normal.     Gait: Gait abnormal.     Comments: Oriented to self. Resting tremor in R hand.  Psychiatric:        Mood and Affect: Mood normal.        Behavior: Behavior normal.     Labs reviewed: Recent Labs    04/20/18 1337 04/25/18 07/18/18  11/26/18 02/13/19 03/13/19  NA 144 135* 140  --  144 147 144  K 3.3* 3.4 3.6  --  3.9 4.3 3.9  CL 97* 93 98  --   --  105  --   CO2 35* 35 33  --   --  35  --   GLUCOSE 121*  --   --   --   --   --   --   BUN 33* 29* 28*   < > 38* 43* 28*  CREATININE 1.24* 1.2* 1.1   < > 1.3* 1.4* 1.3*  CALCIUM 10.6* 8.9 9.9  --   --  10.9  --    < > = values in this interval not displayed.   Recent Labs    04/17/18 04/20/18 1337 04/25/18 11/26/18  AST 17 24 14 15   ALT 9 16 8  6*  ALKPHOS 37 47 40 38  BILITOT  --  0.7  --   --   PROT 5.6 7.9 5.5  --   ALBUMIN 3.3 4.1 3.2  --    Recent Labs    04/20/18 1337  09/19/18 10/01/18 11/26/18  WBC 12.8*   < > 7.7 7.5 7.8  NEUTROABS 10.7*  --   --   --   --   HGB 15.5*   < > 10.4* 11.5* 13.0  HCT 46.7*   < > 31* 34* 38  MCV 91.2  --   --   --   --   PLT 269   < > 232 206 160   < > = values in this interval not displayed.   Lab Results  Component Value Date   TSH 0.90 07/12/2017   No results found for: HGBA1C No results found  for: CHOL, HDL, LDLCALC, LDLDIRECT, TRIG, CHOLHDL  Significant Diagnostic Results in last 30 days:  No results found.  Assessment/Plan Blepharitis of both eyes Will apply 1cm ribbon 0.5% Erythromycin ophthalmic oint qhs x 6 weeks. Observe.   Mixed Alzheimer's and vascular dementia Continue SNF [facility] for safety, care assistance.   Chronic a-fib (HCC) Heart rate is in control, continue Eliquis 2.5mg  bid for thromboembolic risk reduction.   Slow transit constipation Stable, continue Senokot S II po qhs.   Tremor, essential Mostly resting tremor in R hand, not disabling, continue Propranolol 80mg  qd.   Wedge compression fracture of T12 vertebra (HCC) Pain is controlled, continue Tylenol 1000mg  bid po.   Weight loss stable  Depression with anxiety Her mood is stable, continue Mirtazapine 15mg  qd.      Family/ staff Communication: plan of care reviewed with the patient and charge nurse.   Labs/tests ordered:  none  Time spend 25 minutes.

## 2019-04-09 NOTE — Assessment & Plan Note (Signed)
stable °

## 2019-04-09 NOTE — Assessment & Plan Note (Signed)
Will apply 1cm ribbon 0.5% Erythromycin ophthalmic oint qhs x 6 weeks. Observe.

## 2019-04-09 NOTE — Assessment & Plan Note (Signed)
Her mood is stable, continue  Mirtazapine 15mg qd. 

## 2019-04-09 NOTE — Assessment & Plan Note (Signed)
Pain is controlled, continue Tylenol 1000mg  bid po.

## 2019-04-09 NOTE — Assessment & Plan Note (Signed)
Stable, continue Senokot S II po qhs.  

## 2019-04-09 NOTE — Assessment & Plan Note (Addendum)
Heart rate is in control, continue Eliquis 2.5mg bid for thromboembolic risk reduction.  

## 2019-04-09 NOTE — Assessment & Plan Note (Signed)
Continue SNF [facility] for safety, care assistance.  

## 2019-04-09 NOTE — Assessment & Plan Note (Signed)
Mostly resting tremor in R hand, not disabling, continue Propranolol 80mg  qd.

## 2019-04-15 ENCOUNTER — Encounter: Payer: Self-pay | Admitting: Nurse Practitioner

## 2019-04-15 ENCOUNTER — Non-Acute Institutional Stay (SKILLED_NURSING_FACILITY): Payer: Medicare Other | Admitting: Nurse Practitioner

## 2019-04-15 DIAGNOSIS — R269 Unspecified abnormalities of gait and mobility: Secondary | ICD-10-CM

## 2019-04-15 DIAGNOSIS — I482 Chronic atrial fibrillation, unspecified: Secondary | ICD-10-CM | POA: Diagnosis not present

## 2019-04-15 DIAGNOSIS — G309 Alzheimer's disease, unspecified: Secondary | ICD-10-CM

## 2019-04-15 DIAGNOSIS — W19XXXA Unspecified fall, initial encounter: Secondary | ICD-10-CM

## 2019-04-15 DIAGNOSIS — F0280 Dementia in other diseases classified elsewhere without behavioral disturbance: Secondary | ICD-10-CM

## 2019-04-15 DIAGNOSIS — G250 Essential tremor: Secondary | ICD-10-CM

## 2019-04-15 DIAGNOSIS — F0150 Vascular dementia without behavioral disturbance: Secondary | ICD-10-CM | POA: Diagnosis not present

## 2019-04-15 DIAGNOSIS — F418 Other specified anxiety disorders: Secondary | ICD-10-CM

## 2019-04-15 NOTE — Assessment & Plan Note (Signed)
Continue to ambulate with walker with supervision.  

## 2019-04-15 NOTE — Assessment & Plan Note (Signed)
Continue SNF [facility] for safety, care assistance.  

## 2019-04-15 NOTE — Assessment & Plan Note (Signed)
Lack of safety awareness, unsteady gait are contributory, close supervision needed.

## 2019-04-15 NOTE — Assessment & Plan Note (Signed)
Her mood is stable, continue  Mirtazapine 15mg qd. 

## 2019-04-15 NOTE — Assessment & Plan Note (Signed)
Not disabling, intentional tremor in hands. Continue Propranolol 80mg  qd.

## 2019-04-15 NOTE — Assessment & Plan Note (Signed)
Heart rate is in control. Continue Eliquis 2.5mg  bid for thromboembolic risk reduction.

## 2019-04-15 NOTE — Progress Notes (Signed)
Location:  [facility] Room Number: 31 Place of Service:  SNF (31) Provider: Chipper Oman  NP  Mahlon Gammon, MD  Patient Care Team: Mahlon Gammon, MD as PCP - General (Internal Medicine) Nahser, Deloris Ping, MD as PCP - Cardiology (Cardiology) ,  X, NP as Nurse Practitioner (Internal Medicine)  Extended Emergency Contact Information Primary Emergency Contact: Morgan,Tom Address: [address]          [place], [place] [address] [place] Home Phone: [phone] Relation: Friend Secondary Emergency Contact: Brandy [place] Home Phone: [phone] Relation: Niece  Code Status:  DNR Goals of care: Advanced Directive information Advanced Directives 04/09/2019  Does Patient Have a Medical Advance Directive? Yes  Type of Estate agent of [place];Living will;Out of facility DNR (pink MOST or yellow form)  Does patient want to make changes to medical advance directive? No - Patient declined  Copy of Healthcare Power of Attorney in Chart? Yes - validated most recent copy scanned in chart (See row information)  Would patient like information on creating a medical advance directive? -  Pre-existing out of facility DNR order (yellow form or pink MOST form) Yellow form placed in chart (order not valid for inpatient use)     Chief Complaint  Patient presents with  . Acute Visit    C/o - Fall    HPI:  Pt is a 85 y.o. female seen today for an acute visit for fall 04/13/19. The patient was found lying on the floor on her back, sustained R elbow bruise. The patient resides in SNF [facility] for safety, care assistance, ambulates with walker, on Eliquis 2.33m bid for thromboembolic risk reduction Afib, heart rate is in control. Essential tremor, in hand, not disabling, on Propranolol 80mg  qd. Her mood is stable, on Mirtazapine 15mg  qd.    Past Medical History:  Diagnosis Date  . Anticoagulant  long-term use   . Atrial fibrillation (HCC)   . CHF (congestive heart failure) (HCC)   . Chronic anticoagulation   . Hypertension   . Raynaud phenomenon   . Tremor, essential    Past Surgical History:  Procedure Laterality Date  . CHOLECYSTECTOMY    . TONSILLECTOMY      Allergies  Allergen Reactions  . Fosamax [Alendronate]   . Other Nausea And Vomiting    Green pepper    Outpatient Encounter Medications as of 04/15/2019  Medication Sig  . acetaminophen (TYLENOL) 500 MG tablet Take 500 mg by mouth every 8 (eight) hours as needed for mild pain or moderate pain.   Marland Kitchen acetaminophen (TYLENOL) 500 MG tablet Take 1,000 mg by mouth 2 (two) times daily. Not to exceed 3,00 mg daily.  Marland Kitchen apixaban (ELIQUIS) 2.5 MG TABS tablet Take 2.5 mg by mouth 2 (two) times daily.  . Calcium Carbonate-Vit D-Min (CALTRATE 600+D PLUS MINERALS) 600-800 MG-UNIT CHEW Chew 1 tablet by mouth 2 (two) times a day.  . erythromycin ophthalmic ointment Place 1 application into both eyes at bedtime. APPLY TO RT + LT LOWER CONJUNTIVA SAC QHS  . Glycerin-Hypromellose-PEG 400 (ARTIFICIAL TEARS) 0.2-0.2-1 % SOLN Place 1 drop into both eyes 3 (three) times daily.   . mirtazapine (REMERON) 15 MG tablet Take 15 mg by mouth at bedtime.   . Nutritional Supplements (RESOURCE 2.0 PO) Take 120 mLs by mouth 2 (two) times daily.  . propranolol ER (INDERAL LA) 80 MG 24 hr capsule TAKE 1 CAPSULE BY MOUTH  DAILY  . sennosides-docusate sodium (SENOKOT-S) 8.6-50 MG tablet Take 2 tablets by mouth at bedtime.   . [DISCONTINUED] feeding supplement (BOOST / RESOURCE BREEZE) LIQD Take 1 Container by mouth 2 (two) times daily. 120 ml   No facility-administered encounter medications on file as of 04/15/2019.    ROS was provided with assistance of staff.  Review of Systems  Constitutional: Negative for activity change, appetite change, chills, diaphoresis, fatigue and fever.  HENT: Positive for hearing loss. Negative for congestion and voice  change.   Respiratory: Negative for cough, shortness of breath and wheezing.   Cardiovascular: Negative for chest pain, palpitations and leg swelling.  Gastrointestinal: Negative for abdominal distention, abdominal pain, constipation, diarrhea, nausea and vomiting.  Genitourinary: Negative for difficulty urinating, dysuria and urgency.  Musculoskeletal: Positive for back pain and gait problem.  Skin: Positive for color change. Negative for pallor.       R elbow bruise.   Neurological: Positive for tremors. Negative for dizziness, facial asymmetry, speech difficulty and weakness.       Dementia  Psychiatric/Behavioral: Positive for confusion. Negative for agitation, behavioral problems, hallucinations and sleep disturbance. The patient is not nervous/anxious.     Immunization History  Administered Date(s) Administered  . Influenza Whole 05/09/2018  . Influenza-Unspecified 05/28/2017  . Pneumococcal Conjugate-13 07/13/2017  . Pneumococcal Polysaccharide-23 04/07/2002  . Tdap 07/13/2017, 10/08/2017   Pertinent  Health Maintenance Due  Topic Date Due  . INFLUENZA VACCINE  03/08/2019  . DEXA SCAN  Completed  . PNA vac Low Risk Adult  Completed   Fall Risk  10/05/2017  Falls in the past year? No   Functional Status Survey:    Vitals:   04/15/19 1558  BP: 118/68  Pulse: 62  Resp: 20  Temp: (!) 97.1 F (36.2 C)  SpO2: 91%  Weight: 110 lb (49.9 kg)  Height: 5\' 4"  (1.626 m)   Body mass index is 18.88 kg/m. Physical Exam Vitals signs and nursing note reviewed.  Constitutional:      General: She is not in acute distress.    Appearance: Normal appearance. She is not ill-appearing, toxic-appearing or diaphoretic.  HENT:     Head: Normocephalic and atraumatic.     Nose: Nose normal.     Mouth/Throat:     Mouth: Mucous membranes are moist.  Eyes:     Extraocular Movements: Extraocular movements intact.     Conjunctiva/sclera: Conjunctivae normal.     Pupils: Pupils are equal,  round, and reactive to light.  Neck:     Musculoskeletal: Normal range of motion and neck supple.  Cardiovascular:     Rate and Rhythm: Normal rate and regular rhythm.     Heart sounds: No murmur.  Pulmonary:     Breath sounds: No wheezing, rhonchi or rales.  Abdominal:     General: Bowel sounds are normal.     Palpations: Abdomen is soft.     Tenderness: There is no abdominal tenderness. There is no right CVA tenderness, left CVA tenderness, guarding or rebound.  Musculoskeletal:     Right lower leg: No edema.     Left lower leg: No edema.     Comments: Ambulates with walker.   Skin:    General: Skin is warm and dry.     Findings: Bruising present.     Comments: Bruise right elbow.   Neurological:     General: No focal deficit present.     Mental Status: She is alert. Mental status is at  baseline.     Cranial Nerves: No cranial nerve deficit.     Motor: No weakness.     Coordination: Coordination abnormal.     Gait: Gait abnormal.     Comments: Oriented to self. Tremor in hands.   Psychiatric:        Mood and Affect: Mood normal.        Behavior: Behavior normal.     Labs reviewed: Recent Labs    04/20/18 1337 04/25/18 07/18/18  11/26/18 02/13/19 03/13/19  NA 144 135* 140  --  144 147 144  K 3.3* 3.4 3.6  --  3.9 4.3 3.9  CL 97* 93 98  --   --  105  --   CO2 35* 35 33  --   --  35  --   GLUCOSE 121*  --   --   --   --   --   --   BUN 33* 29* 28*   < > 38* 43* 28*  CREATININE 1.24* 1.2* 1.1   < > 1.3* 1.4* 1.3*  CALCIUM 10.6* 8.9 9.9  --   --  10.9  --    < > = values in this interval not displayed.   Recent Labs    04/17/18 04/20/18 1337 04/25/18 11/26/18  AST 17 24 14 15   ALT 9 16 8  6*  ALKPHOS 37 47 40 38  BILITOT  --  0.7  --   --   PROT 5.6 7.9 5.5  --   ALBUMIN 3.3 4.1 3.2  --    Recent Labs    04/20/18 1337  09/19/18 10/01/18 11/26/18  WBC 12.8*   < > 7.7 7.5 7.8  NEUTROABS 10.7*  --   --   --   --   HGB 15.5*   < > 10.4* 11.5* 13.0  HCT 46.7*    < > 31* 34* 38  MCV 91.2  --   --   --   --   PLT 269   < > 232 206 160   < > = values in this interval not displayed.   Lab Results  Component Value Date   TSH 0.90 07/12/2017   No results found for: HGBA1C No results found for: CHOL, HDL, LDLCALC, LDLDIRECT, TRIG, CHOLHDL  Significant Diagnostic Results in last 30 days:  No results found.  Assessment/Plan Fall Lack of safety awareness, unsteady gait are contributory, close supervision needed.   Gait abnormality Continue to ambulate with walker with supervision.   Depression with anxiety Her mood is stable, continue Mirtazapine 15mg  qd.   Mixed Alzheimer's and vascular dementia Continue SNF [facility] for safety, care assistance.   Chronic a-fib (HCC) Heart rate is in control. Continue Eliquis 2.5mg  bid for thromboembolic risk reduction.   Tremor, essential Not disabling, intentional tremor in hands. Continue Propranolol 80mg  qd.      Family/ staff Communication: plan of care reviewed with the patient and charge nurse.   Labs/tests ordered:  none  Time spend 25 minutes.

## 2019-04-23 DIAGNOSIS — R627 Adult failure to thrive: Secondary | ICD-10-CM | POA: Diagnosis not present

## 2019-04-23 DIAGNOSIS — R2681 Unsteadiness on feet: Secondary | ICD-10-CM | POA: Diagnosis not present

## 2019-04-23 DIAGNOSIS — Z9181 History of falling: Secondary | ICD-10-CM | POA: Diagnosis not present

## 2019-04-23 DIAGNOSIS — M6281 Muscle weakness (generalized): Secondary | ICD-10-CM | POA: Diagnosis not present

## 2019-04-23 DIAGNOSIS — H0100A Unspecified blepharitis right eye, upper and lower eyelids: Secondary | ICD-10-CM | POA: Diagnosis not present

## 2019-04-23 DIAGNOSIS — G9009 Other idiopathic peripheral autonomic neuropathy: Secondary | ICD-10-CM | POA: Diagnosis not present

## 2019-04-23 DIAGNOSIS — K5901 Slow transit constipation: Secondary | ICD-10-CM | POA: Diagnosis not present

## 2019-04-23 DIAGNOSIS — Z95810 Presence of automatic (implantable) cardiac defibrillator: Secondary | ICD-10-CM | POA: Diagnosis not present

## 2019-04-23 DIAGNOSIS — G250 Essential tremor: Secondary | ICD-10-CM | POA: Diagnosis not present

## 2019-04-23 DIAGNOSIS — R269 Unspecified abnormalities of gait and mobility: Secondary | ICD-10-CM | POA: Diagnosis not present

## 2019-04-24 DIAGNOSIS — R627 Adult failure to thrive: Secondary | ICD-10-CM | POA: Diagnosis not present

## 2019-04-24 DIAGNOSIS — Z9181 History of falling: Secondary | ICD-10-CM | POA: Diagnosis not present

## 2019-04-24 DIAGNOSIS — G250 Essential tremor: Secondary | ICD-10-CM | POA: Diagnosis not present

## 2019-04-24 DIAGNOSIS — M6281 Muscle weakness (generalized): Secondary | ICD-10-CM | POA: Diagnosis not present

## 2019-04-24 DIAGNOSIS — R2681 Unsteadiness on feet: Secondary | ICD-10-CM | POA: Diagnosis not present

## 2019-04-24 DIAGNOSIS — Z95810 Presence of automatic (implantable) cardiac defibrillator: Secondary | ICD-10-CM | POA: Diagnosis not present

## 2019-04-25 DIAGNOSIS — M6281 Muscle weakness (generalized): Secondary | ICD-10-CM | POA: Diagnosis not present

## 2019-04-25 DIAGNOSIS — R627 Adult failure to thrive: Secondary | ICD-10-CM | POA: Diagnosis not present

## 2019-04-25 DIAGNOSIS — Z9181 History of falling: Secondary | ICD-10-CM | POA: Diagnosis not present

## 2019-04-25 DIAGNOSIS — R2681 Unsteadiness on feet: Secondary | ICD-10-CM | POA: Diagnosis not present

## 2019-04-25 DIAGNOSIS — Z95810 Presence of automatic (implantable) cardiac defibrillator: Secondary | ICD-10-CM | POA: Diagnosis not present

## 2019-04-25 DIAGNOSIS — G250 Essential tremor: Secondary | ICD-10-CM | POA: Diagnosis not present

## 2019-04-28 DIAGNOSIS — Z9181 History of falling: Secondary | ICD-10-CM | POA: Diagnosis not present

## 2019-04-28 DIAGNOSIS — M6281 Muscle weakness (generalized): Secondary | ICD-10-CM | POA: Diagnosis not present

## 2019-04-28 DIAGNOSIS — G250 Essential tremor: Secondary | ICD-10-CM | POA: Diagnosis not present

## 2019-04-28 DIAGNOSIS — Z95810 Presence of automatic (implantable) cardiac defibrillator: Secondary | ICD-10-CM | POA: Diagnosis not present

## 2019-04-28 DIAGNOSIS — R2681 Unsteadiness on feet: Secondary | ICD-10-CM | POA: Diagnosis not present

## 2019-04-28 DIAGNOSIS — R627 Adult failure to thrive: Secondary | ICD-10-CM | POA: Diagnosis not present

## 2019-04-29 DIAGNOSIS — Z95810 Presence of automatic (implantable) cardiac defibrillator: Secondary | ICD-10-CM | POA: Diagnosis not present

## 2019-04-29 DIAGNOSIS — R627 Adult failure to thrive: Secondary | ICD-10-CM | POA: Diagnosis not present

## 2019-04-29 DIAGNOSIS — Z9181 History of falling: Secondary | ICD-10-CM | POA: Diagnosis not present

## 2019-04-29 DIAGNOSIS — R2681 Unsteadiness on feet: Secondary | ICD-10-CM | POA: Diagnosis not present

## 2019-04-29 DIAGNOSIS — M6281 Muscle weakness (generalized): Secondary | ICD-10-CM | POA: Diagnosis not present

## 2019-04-29 DIAGNOSIS — G250 Essential tremor: Secondary | ICD-10-CM | POA: Diagnosis not present

## 2019-05-01 DIAGNOSIS — R2681 Unsteadiness on feet: Secondary | ICD-10-CM | POA: Diagnosis not present

## 2019-05-01 DIAGNOSIS — Z95810 Presence of automatic (implantable) cardiac defibrillator: Secondary | ICD-10-CM | POA: Diagnosis not present

## 2019-05-01 DIAGNOSIS — Z9181 History of falling: Secondary | ICD-10-CM | POA: Diagnosis not present

## 2019-05-01 DIAGNOSIS — M6281 Muscle weakness (generalized): Secondary | ICD-10-CM | POA: Diagnosis not present

## 2019-05-01 DIAGNOSIS — R627 Adult failure to thrive: Secondary | ICD-10-CM | POA: Diagnosis not present

## 2019-05-01 DIAGNOSIS — G250 Essential tremor: Secondary | ICD-10-CM | POA: Diagnosis not present

## 2019-05-05 DIAGNOSIS — Z9181 History of falling: Secondary | ICD-10-CM | POA: Diagnosis not present

## 2019-05-05 DIAGNOSIS — M6281 Muscle weakness (generalized): Secondary | ICD-10-CM | POA: Diagnosis not present

## 2019-05-05 DIAGNOSIS — G250 Essential tremor: Secondary | ICD-10-CM | POA: Diagnosis not present

## 2019-05-05 DIAGNOSIS — Z95810 Presence of automatic (implantable) cardiac defibrillator: Secondary | ICD-10-CM | POA: Diagnosis not present

## 2019-05-05 DIAGNOSIS — R2681 Unsteadiness on feet: Secondary | ICD-10-CM | POA: Diagnosis not present

## 2019-05-05 DIAGNOSIS — R627 Adult failure to thrive: Secondary | ICD-10-CM | POA: Diagnosis not present

## 2019-05-06 DIAGNOSIS — Z03818 Encounter for observation for suspected exposure to other biological agents ruled out: Secondary | ICD-10-CM | POA: Diagnosis not present

## 2019-05-07 DIAGNOSIS — R2681 Unsteadiness on feet: Secondary | ICD-10-CM | POA: Diagnosis not present

## 2019-05-07 DIAGNOSIS — R627 Adult failure to thrive: Secondary | ICD-10-CM | POA: Diagnosis not present

## 2019-05-07 DIAGNOSIS — G250 Essential tremor: Secondary | ICD-10-CM | POA: Diagnosis not present

## 2019-05-07 DIAGNOSIS — Z95810 Presence of automatic (implantable) cardiac defibrillator: Secondary | ICD-10-CM | POA: Diagnosis not present

## 2019-05-07 DIAGNOSIS — M6281 Muscle weakness (generalized): Secondary | ICD-10-CM | POA: Diagnosis not present

## 2019-05-07 DIAGNOSIS — Z9181 History of falling: Secondary | ICD-10-CM | POA: Diagnosis not present

## 2019-05-08 ENCOUNTER — Non-Acute Institutional Stay (SKILLED_NURSING_FACILITY): Payer: Medicare Other | Admitting: Internal Medicine

## 2019-05-08 DIAGNOSIS — G309 Alzheimer's disease, unspecified: Secondary | ICD-10-CM

## 2019-05-08 DIAGNOSIS — R269 Unspecified abnormalities of gait and mobility: Secondary | ICD-10-CM

## 2019-05-08 DIAGNOSIS — H0100A Unspecified blepharitis right eye, upper and lower eyelids: Secondary | ICD-10-CM | POA: Diagnosis not present

## 2019-05-08 DIAGNOSIS — Z95810 Presence of automatic (implantable) cardiac defibrillator: Secondary | ICD-10-CM | POA: Diagnosis not present

## 2019-05-08 DIAGNOSIS — R634 Abnormal weight loss: Secondary | ICD-10-CM

## 2019-05-08 DIAGNOSIS — R2681 Unsteadiness on feet: Secondary | ICD-10-CM | POA: Diagnosis not present

## 2019-05-08 DIAGNOSIS — G9009 Other idiopathic peripheral autonomic neuropathy: Secondary | ICD-10-CM | POA: Diagnosis not present

## 2019-05-08 DIAGNOSIS — G250 Essential tremor: Secondary | ICD-10-CM

## 2019-05-08 DIAGNOSIS — F0280 Dementia in other diseases classified elsewhere without behavioral disturbance: Secondary | ICD-10-CM | POA: Diagnosis not present

## 2019-05-08 DIAGNOSIS — M6281 Muscle weakness (generalized): Secondary | ICD-10-CM | POA: Diagnosis not present

## 2019-05-08 DIAGNOSIS — K5901 Slow transit constipation: Secondary | ICD-10-CM | POA: Diagnosis not present

## 2019-05-08 DIAGNOSIS — I482 Chronic atrial fibrillation, unspecified: Secondary | ICD-10-CM

## 2019-05-08 DIAGNOSIS — F0150 Vascular dementia without behavioral disturbance: Secondary | ICD-10-CM | POA: Diagnosis not present

## 2019-05-08 DIAGNOSIS — I10 Essential (primary) hypertension: Secondary | ICD-10-CM

## 2019-05-08 DIAGNOSIS — H0100B Unspecified blepharitis left eye, upper and lower eyelids: Secondary | ICD-10-CM | POA: Diagnosis not present

## 2019-05-08 NOTE — Progress Notes (Signed)
Location:  [facility] manager of Service:  SNF (31)  Provider:   Code Status:  Goals of Care:  Advanced Directives 04/09/2019  Does Patient Have a Medical Advance Directive? Yes  Type of Paramedic of [place];Living will;Out of facility DNR (pink MOST or yellow form)  Does patient want to make changes to medical advance directive? No - Patient declined  Copy of [place] in Chart? Yes - validated most recent copy scanned in chart (See row information)  Would patient like information on creating a medical advance directive? -  Pre-existing out of facility DNR order (yellow form or pink MOST form) Yellow form placed in chart (order not valid for inpatient use)     Chief Complaint  Patient presents with  . Medical Management of Chronic Issues    HPI: Patient is a 85 y.o. female seen today for medical management of chronic diseases.    Patient has h/o Atrial Fibrillation onEliquis, LE edema, Hypertension, Dysphagia,Essential Tremor, Anemia, Dementia,and depression, Falls Patient is a long-term resident She continues to have all mostly because she tries to get up without her walker Has dementia unable to give any history Also continues to have issues with appetite Has lost few more pounds.   Past Medical History:  Diagnosis Date  . Anticoagulant long-term use   . Atrial fibrillation ([place])   . CHF (congestive heart failure) ([place])   . Chronic anticoagulation   . Hypertension   . Raynaud phenomenon   . Tremor, essential     Past Surgical History:  Procedure Laterality Date  . CHOLECYSTECTOMY    . TONSILLECTOMY      Allergies  Allergen Reactions  . Fosamax [Alendronate]   . Other Nausea And Vomiting    Green pepper    Outpatient Encounter Medications as of 05/08/2019  Medication Sig  . acetaminophen (TYLENOL) 500 MG tablet Take 500 mg by mouth every 8 (eight) hours as needed for mild pain or moderate pain.   Marland Kitchen  acetaminophen (TYLENOL) 500 MG tablet Take 1,000 mg by mouth 2 (two) times daily. Not to exceed 3,00 mg daily.  Marland Kitchen apixaban (ELIQUIS) 2.5 MG TABS tablet Take 2.5 mg by mouth 2 (two) times daily.  . Calcium Carbonate-Vit D-Min (CALTRATE 600+D PLUS MINERALS) 600-800 MG-UNIT CHEW Chew 1 tablet by mouth 2 (two) times a day.  . erythromycin ophthalmic ointment Place 1 application into both eyes at bedtime. APPLY TO RT + LT LOWER CONJUNTIVA SAC QHS  . Glycerin-Hypromellose-PEG 400 (ARTIFICIAL TEARS) 0.2-0.2-1 % SOLN Place 1 drop into both eyes 3 (three) times daily.   . mirtazapine (REMERON) 15 MG tablet Take 15 mg by mouth at bedtime.   . Nutritional Supplements (RESOURCE 2.0 PO) Take 120 mLs by mouth 2 (two) times daily.  . propranolol ER (INDERAL LA) 80 MG 24 hr capsule TAKE 1 CAPSULE BY MOUTH  DAILY  . sennosides-docusate sodium (SENOKOT-S) 8.6-50 MG tablet Take 2 tablets by mouth at bedtime.    No facility-administered encounter medications on file as of 05/08/2019.     Review of Systems:  Review of Systems  Unable to perform ROS: Dementia    Health Maintenance  Topic Date Due  . INFLUENZA VACCINE  03/08/2019  . TETANUS/TDAP  10/09/2027  . DEXA SCAN  Completed  . PNA vac Low Risk Adult  Completed    Physical Exam: Vitals:   05/11/19 2004  BP: 110/70  Pulse: 70  Resp: (!) 22  Temp: (!)  97.4 F (36.3 C)  Weight: 110 lb (49.9 kg)   Body mass index is 18.88 kg/m. Physical Exam Constitutional: Well-developed and well-nourished.  HENT:  Head: Normocephalic.  Mouth/Throat: Oropharynx is clear and moist.  Eyes: Pupils are equal, round, and reactive to light.  Neck: Neck supple.  Cardiovascular: Normal rate and normal heart sounds. Irregular rhythm No murmur heard. Pulmonary/Chest: Effort normal and breath sounds normal. No respiratory distress. No wheezes. She has no rales.  Abdominal: Soft. Bowel sounds are normal. No distension. There is no tenderness. There is no rebound.   Musculoskeletal: No edema.  Lymphadenopathy: none Neurological: No Focal deficits Walks with Walker but unstable Skin: Skin is warm and dry.  Psychiatric: Normal mood and affect. Behavior is normal. Thought content normal.   Labs reviewed: Basic Metabolic Panel: Recent Labs    07/18/18  11/26/18 02/13/19 03/13/19  NA 140  --  144 147 144  K 3.6  --  3.9 4.3 3.9  CL 98  --   --  105  --   CO2 33  --   --  35  --   BUN 28*   < > 38* 43* 28*  CREATININE 1.1   < > 1.3* 1.4* 1.3*  CALCIUM 9.9  --   --  10.9  --    < > = values in this interval not displayed.   Liver Function Tests: Recent Labs    11/26/18  AST 15  ALT 6*  ALKPHOS 38   No results for input(s): LIPASE, AMYLASE in the last 8760 hours. No results for input(s): AMMONIA in the last 8760 hours. CBC: Recent Labs    09/19/18 10/01/18 11/26/18  WBC 7.7 7.5 7.8  HGB 10.4* 11.5* 13.0  HCT 31* 34* 38  PLT 232 206 160   Lipid Panel: No results for input(s): CHOL, HDL, LDLCALC, TRIG, CHOLHDL, LDLDIRECT in the last 8760 hours. No results found for: HGBA1C  Procedures since last visit: No results found.  Assessment/Plan Essential hypertension  BP stable On Inderal Chronic a-fib On Low dose of Eliquis If patient has more falls will take her off Eliquis Oropharyngeal dysphagia Continues on Thin Liquids as does not drink Thickened Liquis Continue on Remeron Continues to slowly loose weight Tremor, essential Stable On Inderal CKD (chronic kidney disease) stage3 Creat stable  Depression with anxiety Continue Only Remeron Dementiawith Failure to thrive Repeat CBC,CMP and TSH   Labs/tests ordered:  * No order type specified * Next appt:  Visit date not found   Total time spent in this patient care encounter was  25_  minutes; greater than 50% of the visit spent counseling  staff, reviewing records , Labs and coordinating care for problems addressed at this encounter.

## 2019-05-11 ENCOUNTER — Encounter: Payer: Self-pay | Admitting: Internal Medicine

## 2019-05-13 DIAGNOSIS — E876 Hypokalemia: Secondary | ICD-10-CM | POA: Diagnosis not present

## 2019-05-13 DIAGNOSIS — Z03818 Encounter for observation for suspected exposure to other biological agents ruled out: Secondary | ICD-10-CM | POA: Diagnosis not present

## 2019-05-13 DIAGNOSIS — E039 Hypothyroidism, unspecified: Secondary | ICD-10-CM | POA: Diagnosis not present

## 2019-05-13 DIAGNOSIS — E782 Mixed hyperlipidemia: Secondary | ICD-10-CM | POA: Diagnosis not present

## 2019-05-13 DIAGNOSIS — Z79899 Other long term (current) drug therapy: Secondary | ICD-10-CM | POA: Diagnosis not present

## 2019-05-13 LAB — BASIC METABOLIC PANEL
BUN: 28 — AB (ref 4–21)
Creatinine: 1.1 (ref 0.5–1.1)
Glucose: 94
Potassium: 3.8 (ref 3.4–5.3)
Sodium: 142 (ref 137–147)

## 2019-05-13 LAB — CBC AND DIFFERENTIAL
HCT: 37 (ref 36–46)
Hemoglobin: 12.5 (ref 12.0–16.0)
Platelets: 204 (ref 150–399)
WBC: 8.1

## 2019-05-13 LAB — HEPATIC FUNCTION PANEL
ALT: 6 — AB (ref 7–35)
AST: 14 (ref 13–35)
Alkaline Phosphatase: 43 (ref 25–125)
Bilirubin, Total: 0.7

## 2019-05-13 LAB — TSH: TSH: 1.6 (ref 0.41–5.90)

## 2019-05-14 DIAGNOSIS — Z95810 Presence of automatic (implantable) cardiac defibrillator: Secondary | ICD-10-CM | POA: Diagnosis not present

## 2019-05-14 DIAGNOSIS — F0150 Vascular dementia without behavioral disturbance: Secondary | ICD-10-CM | POA: Diagnosis not present

## 2019-05-14 DIAGNOSIS — G250 Essential tremor: Secondary | ICD-10-CM | POA: Diagnosis not present

## 2019-05-14 DIAGNOSIS — R2681 Unsteadiness on feet: Secondary | ICD-10-CM | POA: Diagnosis not present

## 2019-05-14 DIAGNOSIS — R634 Abnormal weight loss: Secondary | ICD-10-CM | POA: Diagnosis not present

## 2019-05-14 DIAGNOSIS — M6281 Muscle weakness (generalized): Secondary | ICD-10-CM | POA: Diagnosis not present

## 2019-05-15 ENCOUNTER — Non-Acute Institutional Stay (SKILLED_NURSING_FACILITY): Payer: Medicare Other | Admitting: Internal Medicine

## 2019-05-15 ENCOUNTER — Encounter: Payer: Self-pay | Admitting: Internal Medicine

## 2019-05-15 DIAGNOSIS — F0280 Dementia in other diseases classified elsewhere without behavioral disturbance: Secondary | ICD-10-CM

## 2019-05-15 DIAGNOSIS — R634 Abnormal weight loss: Secondary | ICD-10-CM

## 2019-05-15 DIAGNOSIS — W19XXXA Unspecified fall, initial encounter: Secondary | ICD-10-CM | POA: Diagnosis not present

## 2019-05-15 DIAGNOSIS — R269 Unspecified abnormalities of gait and mobility: Secondary | ICD-10-CM | POA: Diagnosis not present

## 2019-05-15 DIAGNOSIS — F0150 Vascular dementia without behavioral disturbance: Secondary | ICD-10-CM

## 2019-05-15 DIAGNOSIS — G250 Essential tremor: Secondary | ICD-10-CM

## 2019-05-15 DIAGNOSIS — M6281 Muscle weakness (generalized): Secondary | ICD-10-CM | POA: Diagnosis not present

## 2019-05-15 DIAGNOSIS — I10 Essential (primary) hypertension: Secondary | ICD-10-CM

## 2019-05-15 DIAGNOSIS — Z95810 Presence of automatic (implantable) cardiac defibrillator: Secondary | ICD-10-CM | POA: Diagnosis not present

## 2019-05-15 DIAGNOSIS — G309 Alzheimer's disease, unspecified: Secondary | ICD-10-CM

## 2019-05-15 DIAGNOSIS — R2681 Unsteadiness on feet: Secondary | ICD-10-CM | POA: Diagnosis not present

## 2019-05-15 NOTE — Progress Notes (Signed)
Location[facility] manager of Service:  SNF (31)  Provider:   Code Status:  Goals of Care:  Advanced Directives 04/09/2019  Does Patient Have a Medical Advance Directive? Yes  Type of Paramedic of [place];Living will;Out of facility DNR (pink MOST or yellow form)  Does patient want to make changes to medical advance directive? No - Patient declined  Copy of [place] in Chart? Yes - validated most recent copy scanned in chart (See row information)  Would patient like information on creating a medical advance directive? -  Pre-existing out of facility DNR order (yellow form or pink MOST form) Yellow form placed in chart (order not valid for inpatient use)     Chief Complaint  Patient presents with  . Acute Visit    HPI: Patient is a 85 y.o. female seen today for an acute visit for Fall Patient has h/o Atrial Fibrillation onEliquis, LE edema, Hypertension, Dysphagia,Essential Tremor, Anemia, Dementia,and depression, Falls Patient is a long-term resident of facility .  She was walking with her walker trying to get in with other resident. When she lost her balance and fell and hit her head.  She had a big knot but no bleed. Patient was alert and responsive.  Unable to give any history.  Continues to say she is okay. Would not let me do the full exam appointment  Past Medical History:  Diagnosis Date  . Anticoagulant long-term use   . Atrial fibrillation ([place])   . CHF (congestive heart failure) ([place])   . Chronic anticoagulation   . Hypertension   . Raynaud phenomenon   . Tremor, essential     Past Surgical History:  Procedure Laterality Date  . CHOLECYSTECTOMY    . TONSILLECTOMY      Allergies  Allergen Reactions  . Fosamax [Alendronate]   . Other Nausea And Vomiting    Green pepper    Outpatient Encounter Medications as of 05/15/2019  Medication Sig  . acetaminophen (TYLENOL) 500 MG tablet Take 500 mg by  mouth every 8 (eight) hours as needed for mild pain or moderate pain.   Marland Kitchen acetaminophen (TYLENOL) 500 MG tablet Take 1,000 mg by mouth 2 (two) times daily. Not to exceed 3,00 mg daily.  Marland Kitchen apixaban (ELIQUIS) 2.5 MG TABS tablet Take 2.5 mg by mouth 2 (two) times daily.  . Calcium Carbonate-Vit D-Min (CALTRATE 600+D PLUS MINERALS) 600-800 MG-UNIT CHEW Chew 1 tablet by mouth 2 (two) times a day.  . erythromycin ophthalmic ointment Place 1 application into both eyes at bedtime. APPLY TO RT + LT LOWER CONJUNTIVA SAC QHS  . Glycerin-Hypromellose-PEG 400 (ARTIFICIAL TEARS) 0.2-0.2-1 % SOLN Place 1 drop into both eyes 3 (three) times daily.   . mirtazapine (REMERON) 15 MG tablet Take 15 mg by mouth at bedtime.   . Nutritional Supplements (RESOURCE 2.0 PO) Take 120 mLs by mouth 2 (two) times daily.  . propranolol ER (INDERAL LA) 80 MG 24 hr capsule TAKE 1 CAPSULE BY MOUTH  DAILY  . sennosides-docusate sodium (SENOKOT-S) 8.6-50 MG tablet Take 2 tablets by mouth at bedtime.    No facility-administered encounter medications on file as of 05/15/2019.     Review of Systems:  Review of Systems  Unable to perform ROS: Dementia    Health Maintenance  Topic Date Due  . INFLUENZA VACCINE  03/08/2019  . TETANUS/TDAP  10/09/2027  . DEXA SCAN  Completed  . PNA vac Low Risk Adult  Completed  Physical Exam: Vitals:   05/15/19 1550  BP: 120/80  Pulse: 68  Resp: 20  Weight: 107 lb 11.2 oz (48.9 kg)   Body mass index is 18.49 kg/m. Physical Exam Vitals signs reviewed.  Constitutional:      Appearance: Normal appearance.  HENT:     Head: Normocephalic.     Comments: Had Knot on lateral side of her head    Nose: Nose normal.     Mouth/Throat:     Mouth: Mucous membranes are moist.     Pharynx: Oropharynx is clear.  Eyes:     Pupils: Pupils are equal, round, and reactive to light.  Neck:     Musculoskeletal: Neck supple.  Cardiovascular:     Rate and Rhythm: Rhythm irregular.     Pulses:  Normal pulses.  Pulmonary:     Effort: Pulmonary effort is normal. No respiratory distress.     Breath sounds: No wheezing or rales.  Abdominal:     General: Abdomen is flat. Bowel sounds are normal.     Palpations: Abdomen is soft.  Musculoskeletal:        General: No swelling.  Skin:    General: Skin is warm.  Neurological:     General: No focal deficit present.     Mental Status: She is alert.     Labs reviewed: Basic Metabolic Panel: Recent Labs    07/18/18  11/26/18 02/13/19 03/13/19  NA 140  --  144 147 144  K 3.6  --  3.9 4.3 3.9  CL 98  --   --  105  --   CO2 33  --   --  35  --   BUN 28*   < > 38* 43* 28*  CREATININE 1.1   < > 1.3* 1.4* 1.3*  CALCIUM 9.9  --   --  10.9  --    < > = values in this interval not displayed.   Liver Function Tests: Recent Labs    11/26/18  AST 15  ALT 6*  ALKPHOS 38   No results for input(s): LIPASE, AMYLASE in the last 8760 hours. No results for input(s): AMMONIA in the last 8760 hours. CBC: Recent Labs    09/19/18 10/01/18 11/26/18  WBC 7.7 7.5 7.8  HGB 10.4* 11.5* 13.0  HCT 31* 34* 38  PLT 232 206 160   Lipid Panel: No results for input(s): CHOL, HDL, LDLCALC, TRIG, CHOLHDL, LDLDIRECT in the last 8760 hours. No results found for: HGBA1C  Procedures since last visit: No results found.  Assessment/Plan 1. Injury due to fall, initial encounter Neuro Check per facility protocol   H/O Chronic A fib Discontinue Eliquis She continues to be high risk for falls and Injuries  Oropharyngeal dysphagia Continue to loose weight has lost 3 more lbs in past 2 weeks On Remeron  Essential Hypertension On Inderal Tremor, essential Stable On Inderal CKD (chronic kidney disease) stage3 Creat stable   Labs/tests ordered:  CBC,CMP, TSH, Vit B12 Next appt:  Visit date not found Total time spent in this patient care encounter was  25_  minutes; greater than 50% of the visit spent counseling  staff, reviewing records ,  Labs and coordinating care for problems addressed at this encounter.

## 2019-05-16 ENCOUNTER — Encounter: Payer: Self-pay | Admitting: Nurse Practitioner

## 2019-05-16 ENCOUNTER — Non-Acute Institutional Stay (SKILLED_NURSING_FACILITY): Payer: Medicare Other | Admitting: Nurse Practitioner

## 2019-05-16 DIAGNOSIS — G309 Alzheimer's disease, unspecified: Secondary | ICD-10-CM

## 2019-05-16 DIAGNOSIS — R634 Abnormal weight loss: Secondary | ICD-10-CM | POA: Diagnosis not present

## 2019-05-16 DIAGNOSIS — I482 Chronic atrial fibrillation, unspecified: Secondary | ICD-10-CM

## 2019-05-16 DIAGNOSIS — S0003XA Contusion of scalp, initial encounter: Secondary | ICD-10-CM | POA: Insufficient documentation

## 2019-05-16 DIAGNOSIS — M6281 Muscle weakness (generalized): Secondary | ICD-10-CM | POA: Diagnosis not present

## 2019-05-16 DIAGNOSIS — W19XXXD Unspecified fall, subsequent encounter: Secondary | ICD-10-CM

## 2019-05-16 DIAGNOSIS — G250 Essential tremor: Secondary | ICD-10-CM | POA: Diagnosis not present

## 2019-05-16 DIAGNOSIS — S0003XD Contusion of scalp, subsequent encounter: Secondary | ICD-10-CM | POA: Diagnosis not present

## 2019-05-16 DIAGNOSIS — R2681 Unsteadiness on feet: Secondary | ICD-10-CM | POA: Diagnosis not present

## 2019-05-16 DIAGNOSIS — F0150 Vascular dementia without behavioral disturbance: Secondary | ICD-10-CM

## 2019-05-16 DIAGNOSIS — F418 Other specified anxiety disorders: Secondary | ICD-10-CM

## 2019-05-16 DIAGNOSIS — F0280 Dementia in other diseases classified elsewhere without behavioral disturbance: Secondary | ICD-10-CM

## 2019-05-16 DIAGNOSIS — Z95810 Presence of automatic (implantable) cardiac defibrillator: Secondary | ICD-10-CM | POA: Diagnosis not present

## 2019-05-16 LAB — CHLORIDE
Albumin: 3.8
Calcium: 9.5
Carbon Dioxide, Total: 33
Chloride: 102
Globulin: 2.5
Total Protein: 6.3 — AB (ref 6.4–8.2)

## 2019-05-16 NOTE — Assessment & Plan Note (Addendum)
No pain palpated today, stable, no focal weakness, off Eliquis, Hgb 12.5 05/13/19. observe.

## 2019-05-16 NOTE — Progress Notes (Signed)
Location:   S[facility] [place] Room Number: 59 Place of Service:  SNF (31) Provider:  Damone Fancher NP  [name], MD  Patient Care Team: [name], MD as PCP - General (Internal Medicine) Nahser, Wonda Cheng, MD as PCP - Cardiology (Cardiology) Keyauna Graefe X, NP as Nurse Practitioner (Internal Medicine)  Extended Emergency Contact Information Primary Emergency Contact: Morgan,Tom Address: [address]          [place], [place] [address] [place]te [place]itter of [employer] Phone: [phone] Relation: Friend Secondary Emergency Contact: [place] Phone: [phone] Relation: Niece  Code Status:  DNR Goals of care: Advanced Directive information Advanced Directives 05/16/2019  Does Patient Have a Medical Advance Directive? Yes  Type of Paramedic of [place];Living will;Out of facility DNR (pink MOST or yellow form)  Does patient want to make changes to medical advance directive? No - Patient declined  Copy of [place] in Chart? Yes - validated most recent copy scanned in chart (See row information)  Would patient like information on creating a medical advance directive? -  Pre-existing out of facility DNR order (yellow form or pink MOST form) Yellow form placed in chart (order not valid for inpatient use)     Chief Complaint  Patient presents with  . Acute Visit    Fall scalp hematoma    HPI:  Pt is a 85 y.o. female seen today for an acute visit for follow up  sustained a right parietal scalp hematoma from fall 05/15/19 when witnessed fall. Eliquis discontinued subsequently. Hx of afib, heart rate is in control. Essential tremor, not disabling, on Propranolol 80mg  qd. She takes Mirtazapine 15mg  for mood and weight loss.    Past Medical History:  Diagnosis Date  . Anticoagulant long-term use   . Atrial fibrillation ([place])   . CHF (congestive heart failure) ([place])   . Chronic  anticoagulation   . Hypertension   . Raynaud phenomenon   . Tremor, essential    Past Surgical History:  Procedure Laterality Date  . CHOLECYSTECTOMY    . TONSILLECTOMY      Allergies  Allergen Reactions  . Fosamax [Alendronate]   . Other Nausea And Vomiting    Green pepper    Allergies as of 05/16/2019      Reactions   Fosamax [alendronate]    Other Nausea And Vomiting   Green pepper      Medication List       Accurate as of May 16, 2019  2:35 PM. If you have any questions, ask your nurse or doctor.        STOP taking these medications   Eliquis 2.5 MG Tabs tablet Generic drug: apixaban Stopped by: Dalin Caldera X Mahayla Haddaway, NP     TAKE these medications   acetaminophen 500 MG tablet Commonly known as: TYLENOL Take 500 mg by mouth every 8 (eight) hours as needed for mild pain or moderate pain.   acetaminophen 500 MG tablet Commonly known as: TYLENOL Take 1,000 mg by mouth 2 (two) times daily. Not to exceed 3,00 mg daily.   Artificial Tears 0.2-0.2-1 % Soln Generic drug: Glycerin-Hypromellose-PEG 400 Place 1 drop into both eyes 3 (three) times daily.   Caltrate 600+D Plus Minerals 600-800 MG-UNIT Chew Chew 1 tablet by mouth 2 (two) times a day.   erythromycin ophthalmic ointment Place 1 application into both eyes at bedtime. APPLY TO RT + LT LOWER CONJUNTIVA [place]  QHS   mirtazapine 15 MG tablet Commonly known as: REMERON Take 15 mg by mouth at bedtime.   propranolol ER 80 MG 24 hr capsule Commonly known as: INDERAL LA TAKE 1 CAPSULE BY MOUTH  DAILY   RESOURCE 2.0 PO Take 120 mLs by mouth 2 (two) times daily.   sennosides-docusate sodium 8.6-50 MG tablet Commonly known as: SENOKOT-S Take 2 tablets by mouth at bedtime.      ROS was provided with assistance of staff Review of Systems  Constitutional: Positive for fatigue. Negative for activity change, appetite change, chills, diaphoresis and fever.  HENT: Positive for hearing loss. Negative for congestion  and voice change.   Respiratory: Negative for cough, shortness of breath and wheezing.   Gastrointestinal: Negative for abdominal distention, abdominal pain, constipation, diarrhea, nausea and vomiting.  Genitourinary: Negative for difficulty urinating, dysuria and urgency.  Musculoskeletal: Positive for arthralgias and gait problem.  Skin:       Right scalp hematoma  Neurological: Positive for tremors. Negative for dizziness, speech difficulty, weakness and headaches.       Dementia  Psychiatric/Behavioral: Positive for confusion. Negative for agitation, behavioral problems, hallucinations and sleep disturbance. The patient is not nervous/anxious.     Immunization History  Administered Date(s) Administered  . Influenza Whole 05/09/2018  . Influenza-Unspecified 05/28/2017  . Pneumococcal Conjugate-13 07/13/2017  . Pneumococcal Polysaccharide-23 04/07/2002  . Tdap 07/13/2017, 10/08/2017   Pertinent  Health Maintenance Due  Topic Date Due  . INFLUENZA VACCINE  03/08/2019  . DEXA SCAN  Completed  . PNA vac Low Risk Adult  Completed   Fall Risk  10/05/2017  Falls in the past year? No   Functional Status Survey:    Vitals:   05/16/19 1123  BP: 140/60  Pulse: 65  Resp: 18  Temp: (!) 97 F (36.1 C)  SpO2: 91%  Weight: 107 lb 11.2 oz (48.9 kg)  Height: 5\' 4"  (1.626 m)   Body mass index is 18.49 kg/m. Physical Exam Vitals signs and nursing note reviewed.  Constitutional:      Appearance: Normal appearance.  HENT:     Head: Normocephalic and atraumatic.     Mouth/Throat:     Mouth: Mucous membranes are moist.  Eyes:     Extraocular Movements: Extraocular movements intact.     Conjunctiva/sclera: Conjunctivae normal.     Pupils: Pupils are equal, round, and reactive to light.  Neck:     Musculoskeletal: Normal range of motion and neck supple.  Cardiovascular:     Rate and Rhythm: Normal rate. Rhythm irregular.  Pulmonary:     Breath sounds: Normal breath sounds. No  wheezing, rhonchi or rales.  Chest:     Chest wall: No tenderness.  Abdominal:     General: Bowel sounds are normal. There is no distension.     Palpations: Abdomen is soft.     Tenderness: There is no abdominal tenderness. There is no right CVA tenderness, left CVA tenderness, guarding or rebound.  Musculoskeletal:     Right lower leg: No edema.     Left lower leg: Edema present.  Skin:    General: Skin is warm and dry.     Findings: Bruising present.     Comments: A quarter sized hematoma R parietal region  Neurological:     General: No focal deficit present.     Mental Status: She is alert. Mental status is at baseline.     Cranial Nerves: No cranial nerve deficit.  Motor: No weakness.     Coordination: Coordination abnormal.     Gait: Gait abnormal.     Comments: Oriented to self.   Psychiatric:        Mood and Affect: Mood normal.        Behavior: Behavior normal.     Labs reviewed: Recent Labs    07/18/18  02/13/19 03/13/19 05/13/19  NA 140   < > 147 144 142  K 3.6   < > 4.3 3.9 3.8  CL 98  --  105  --  102  CO2 33  --  35  --  33  BUN 28*   < > 43* 28* 28*  CREATININE 1.1   < > 1.4* 1.3* 1.1  CALCIUM 9.9  --  10.9  --  9.5   < > = values in this interval not displayed.   Recent Labs    11/26/18 05/13/19  AST 15 14  ALT 6* 6*  ALKPHOS 38 43  PROT  --  6.3*  ALBUMIN  --  3.8   Recent Labs    10/01/18 11/26/18 05/13/19  WBC 7.5 7.8 8.1  HGB 11.5* 13.0 12.5  HCT 34* 38 37  PLT 206 160 204   Lab Results  Component Value Date   TSH 1.60 05/13/2019   No results found for: HGBA1C No results found for: CHOL, HDL, LDLCALC, LDLDIRECT, TRIG, CHOLHDL  Significant Diagnostic Results in last 30 days:  No results found.  Assessment/Plan Hematoma of right parietal scalp No pain palpated today, stable, no focal weakness, off Eliquis, observe.   Fall Lack of safety awareness, unsteady gait/balance are contributory, close supervision for safety, care  assistance.   Mixed Alzheimer's and vascular dementia Lack of safety awareness, close supervision for safety needed.   Chronic a-fib (HCC) Heart rate is in control, off Eliquis due to frequent falling.   Tremor, essential Fine tremor in hands, not disabling, continue Propranolol.   Depression with anxiety Mood is stable, weight loss persisted, continue Mirtazapine 15mg  qd.      Family/ staff Communication:plan of care reviewed with the patient and charge nurse.   Labs/tests ordered:  none  Time spend 25 minutes.

## 2019-05-16 NOTE — Assessment & Plan Note (Signed)
Fine tremor in hands, not disabling, continue Propranolol.

## 2019-05-16 NOTE — Assessment & Plan Note (Signed)
Mood is stable, weight loss persisted, continue Mirtazapine 15mg  qd.

## 2019-05-16 NOTE — Assessment & Plan Note (Signed)
Heart rate is in control, off Eliquis due to frequent falling.

## 2019-05-16 NOTE — Assessment & Plan Note (Signed)
Lack of safety awareness, close supervision for safety needed.

## 2019-05-16 NOTE — Assessment & Plan Note (Signed)
Lack of safety awareness, unsteady gait/balance are contributory, close supervision for safety, care assistance.

## 2019-05-20 DIAGNOSIS — E032 Hypothyroidism due to medicaments and other exogenous substances: Secondary | ICD-10-CM | POA: Diagnosis not present

## 2019-05-20 DIAGNOSIS — F0150 Vascular dementia without behavioral disturbance: Secondary | ICD-10-CM | POA: Diagnosis not present

## 2019-05-20 DIAGNOSIS — I482 Chronic atrial fibrillation, unspecified: Secondary | ICD-10-CM | POA: Diagnosis not present

## 2019-05-20 DIAGNOSIS — I129 Hypertensive chronic kidney disease with stage 1 through stage 4 chronic kidney disease, or unspecified chronic kidney disease: Secondary | ICD-10-CM | POA: Diagnosis not present

## 2019-05-21 LAB — HEPATIC FUNCTION PANEL
ALT: 5 — AB (ref 7–35)
AST: 13 (ref 13–35)
Alkaline Phosphatase: 41 (ref 25–125)
Bilirubin, Total: 0.7

## 2019-05-21 LAB — CBC AND DIFFERENTIAL
HCT: 36 (ref 36–46)
Hemoglobin: 12 (ref 12.0–16.0)
Neutrophils Absolute: 5651
Platelets: 183 (ref 150–399)
WBC: 9.1

## 2019-05-21 LAB — BASIC METABOLIC PANEL
BUN: 26 — AB (ref 4–21)
Creatinine: 1.1 (ref 0.5–1.1)
Glucose: 72
Potassium: 3.7 (ref 3.4–5.3)
Sodium: 144 (ref 137–147)

## 2019-05-21 LAB — VITAMIN B12: Vitamin B-12: 352

## 2019-05-21 LAB — TSH: TSH: 0.76 (ref 0.41–5.90)

## 2019-05-27 DIAGNOSIS — E46 Unspecified protein-calorie malnutrition: Secondary | ICD-10-CM | POA: Diagnosis not present

## 2019-05-27 DIAGNOSIS — H01003 Unspecified blepharitis right eye, unspecified eyelid: Secondary | ICD-10-CM | POA: Diagnosis not present

## 2019-05-27 DIAGNOSIS — Z947 Corneal transplant status: Secondary | ICD-10-CM | POA: Diagnosis not present

## 2019-05-27 DIAGNOSIS — R296 Repeated falls: Secondary | ICD-10-CM | POA: Diagnosis not present

## 2019-05-27 DIAGNOSIS — I7300 Raynaud's syndrome without gangrene: Secondary | ICD-10-CM | POA: Diagnosis not present

## 2019-05-27 DIAGNOSIS — G250 Essential tremor: Secondary | ICD-10-CM | POA: Diagnosis not present

## 2019-05-27 DIAGNOSIS — I4891 Unspecified atrial fibrillation: Secondary | ICD-10-CM | POA: Diagnosis not present

## 2019-05-27 DIAGNOSIS — S22080D Wedge compression fracture of T11-T12 vertebra, subsequent encounter for fracture with routine healing: Secondary | ICD-10-CM | POA: Diagnosis not present

## 2019-05-27 DIAGNOSIS — Z87440 Personal history of urinary (tract) infections: Secondary | ICD-10-CM | POA: Diagnosis not present

## 2019-05-27 DIAGNOSIS — I130 Hypertensive heart and chronic kidney disease with heart failure and stage 1 through stage 4 chronic kidney disease, or unspecified chronic kidney disease: Secondary | ICD-10-CM | POA: Diagnosis not present

## 2019-05-27 DIAGNOSIS — Z741 Need for assistance with personal care: Secondary | ICD-10-CM | POA: Diagnosis not present

## 2019-05-27 DIAGNOSIS — F418 Other specified anxiety disorders: Secondary | ICD-10-CM | POA: Diagnosis not present

## 2019-05-27 DIAGNOSIS — D631 Anemia in chronic kidney disease: Secondary | ICD-10-CM | POA: Diagnosis not present

## 2019-05-27 DIAGNOSIS — N183 Chronic kidney disease, stage 3 unspecified: Secondary | ICD-10-CM | POA: Diagnosis not present

## 2019-05-27 DIAGNOSIS — G309 Alzheimer's disease, unspecified: Secondary | ICD-10-CM | POA: Diagnosis not present

## 2019-05-27 DIAGNOSIS — F0280 Dementia in other diseases classified elsewhere without behavioral disturbance: Secondary | ICD-10-CM | POA: Diagnosis not present

## 2019-05-27 DIAGNOSIS — R159 Full incontinence of feces: Secondary | ICD-10-CM | POA: Diagnosis not present

## 2019-05-27 DIAGNOSIS — R32 Unspecified urinary incontinence: Secondary | ICD-10-CM | POA: Diagnosis not present

## 2019-05-27 DIAGNOSIS — I509 Heart failure, unspecified: Secondary | ICD-10-CM | POA: Diagnosis not present

## 2019-05-27 DIAGNOSIS — Z681 Body mass index (BMI) 19 or less, adult: Secondary | ICD-10-CM | POA: Diagnosis not present

## 2019-05-28 DIAGNOSIS — N183 Chronic kidney disease, stage 3 unspecified: Secondary | ICD-10-CM | POA: Diagnosis not present

## 2019-05-28 DIAGNOSIS — I130 Hypertensive heart and chronic kidney disease with heart failure and stage 1 through stage 4 chronic kidney disease, or unspecified chronic kidney disease: Secondary | ICD-10-CM | POA: Diagnosis not present

## 2019-05-28 DIAGNOSIS — R296 Repeated falls: Secondary | ICD-10-CM | POA: Diagnosis not present

## 2019-05-28 DIAGNOSIS — I509 Heart failure, unspecified: Secondary | ICD-10-CM | POA: Diagnosis not present

## 2019-05-28 DIAGNOSIS — F0280 Dementia in other diseases classified elsewhere without behavioral disturbance: Secondary | ICD-10-CM | POA: Diagnosis not present

## 2019-05-28 DIAGNOSIS — G309 Alzheimer's disease, unspecified: Secondary | ICD-10-CM | POA: Diagnosis not present

## 2019-05-29 DIAGNOSIS — G309 Alzheimer's disease, unspecified: Secondary | ICD-10-CM | POA: Diagnosis not present

## 2019-05-29 DIAGNOSIS — R296 Repeated falls: Secondary | ICD-10-CM | POA: Diagnosis not present

## 2019-05-29 DIAGNOSIS — N183 Chronic kidney disease, stage 3 unspecified: Secondary | ICD-10-CM | POA: Diagnosis not present

## 2019-05-29 DIAGNOSIS — I509 Heart failure, unspecified: Secondary | ICD-10-CM | POA: Diagnosis not present

## 2019-05-29 DIAGNOSIS — F0280 Dementia in other diseases classified elsewhere without behavioral disturbance: Secondary | ICD-10-CM | POA: Diagnosis not present

## 2019-05-29 DIAGNOSIS — I130 Hypertensive heart and chronic kidney disease with heart failure and stage 1 through stage 4 chronic kidney disease, or unspecified chronic kidney disease: Secondary | ICD-10-CM | POA: Diagnosis not present

## 2019-06-02 DIAGNOSIS — R296 Repeated falls: Secondary | ICD-10-CM | POA: Diagnosis not present

## 2019-06-02 DIAGNOSIS — F0280 Dementia in other diseases classified elsewhere without behavioral disturbance: Secondary | ICD-10-CM | POA: Diagnosis not present

## 2019-06-02 DIAGNOSIS — I509 Heart failure, unspecified: Secondary | ICD-10-CM | POA: Diagnosis not present

## 2019-06-02 DIAGNOSIS — N183 Chronic kidney disease, stage 3 unspecified: Secondary | ICD-10-CM | POA: Diagnosis not present

## 2019-06-02 DIAGNOSIS — I130 Hypertensive heart and chronic kidney disease with heart failure and stage 1 through stage 4 chronic kidney disease, or unspecified chronic kidney disease: Secondary | ICD-10-CM | POA: Diagnosis not present

## 2019-06-02 DIAGNOSIS — G309 Alzheimer's disease, unspecified: Secondary | ICD-10-CM | POA: Diagnosis not present

## 2019-06-03 DIAGNOSIS — R296 Repeated falls: Secondary | ICD-10-CM | POA: Diagnosis not present

## 2019-06-03 DIAGNOSIS — I509 Heart failure, unspecified: Secondary | ICD-10-CM | POA: Diagnosis not present

## 2019-06-03 DIAGNOSIS — G309 Alzheimer's disease, unspecified: Secondary | ICD-10-CM | POA: Diagnosis not present

## 2019-06-03 DIAGNOSIS — M79671 Pain in right foot: Secondary | ICD-10-CM | POA: Diagnosis not present

## 2019-06-03 DIAGNOSIS — Z03818 Encounter for observation for suspected exposure to other biological agents ruled out: Secondary | ICD-10-CM | POA: Diagnosis not present

## 2019-06-03 DIAGNOSIS — F0280 Dementia in other diseases classified elsewhere without behavioral disturbance: Secondary | ICD-10-CM | POA: Diagnosis not present

## 2019-06-03 DIAGNOSIS — I130 Hypertensive heart and chronic kidney disease with heart failure and stage 1 through stage 4 chronic kidney disease, or unspecified chronic kidney disease: Secondary | ICD-10-CM | POA: Diagnosis not present

## 2019-06-03 DIAGNOSIS — M79672 Pain in left foot: Secondary | ICD-10-CM | POA: Diagnosis not present

## 2019-06-03 DIAGNOSIS — N183 Chronic kidney disease, stage 3 unspecified: Secondary | ICD-10-CM | POA: Diagnosis not present

## 2019-06-03 DIAGNOSIS — B351 Tinea unguium: Secondary | ICD-10-CM | POA: Diagnosis not present

## 2019-06-08 DIAGNOSIS — I130 Hypertensive heart and chronic kidney disease with heart failure and stage 1 through stage 4 chronic kidney disease, or unspecified chronic kidney disease: Secondary | ICD-10-CM | POA: Diagnosis not present

## 2019-06-08 DIAGNOSIS — G250 Essential tremor: Secondary | ICD-10-CM | POA: Diagnosis not present

## 2019-06-08 DIAGNOSIS — I7300 Raynaud's syndrome without gangrene: Secondary | ICD-10-CM | POA: Diagnosis not present

## 2019-06-08 DIAGNOSIS — F418 Other specified anxiety disorders: Secondary | ICD-10-CM | POA: Diagnosis not present

## 2019-06-08 DIAGNOSIS — N183 Chronic kidney disease, stage 3 unspecified: Secondary | ICD-10-CM | POA: Diagnosis not present

## 2019-06-08 DIAGNOSIS — E46 Unspecified protein-calorie malnutrition: Secondary | ICD-10-CM | POA: Diagnosis not present

## 2019-06-08 DIAGNOSIS — H01003 Unspecified blepharitis right eye, unspecified eyelid: Secondary | ICD-10-CM | POA: Diagnosis not present

## 2019-06-08 DIAGNOSIS — I4891 Unspecified atrial fibrillation: Secondary | ICD-10-CM | POA: Diagnosis not present

## 2019-06-08 DIAGNOSIS — Z947 Corneal transplant status: Secondary | ICD-10-CM | POA: Diagnosis not present

## 2019-06-08 DIAGNOSIS — Z681 Body mass index (BMI) 19 or less, adult: Secondary | ICD-10-CM | POA: Diagnosis not present

## 2019-06-08 DIAGNOSIS — F0280 Dementia in other diseases classified elsewhere without behavioral disturbance: Secondary | ICD-10-CM | POA: Diagnosis not present

## 2019-06-08 DIAGNOSIS — R32 Unspecified urinary incontinence: Secondary | ICD-10-CM | POA: Diagnosis not present

## 2019-06-08 DIAGNOSIS — D631 Anemia in chronic kidney disease: Secondary | ICD-10-CM | POA: Diagnosis not present

## 2019-06-08 DIAGNOSIS — Z87440 Personal history of urinary (tract) infections: Secondary | ICD-10-CM | POA: Diagnosis not present

## 2019-06-08 DIAGNOSIS — R159 Full incontinence of feces: Secondary | ICD-10-CM | POA: Diagnosis not present

## 2019-06-08 DIAGNOSIS — Z741 Need for assistance with personal care: Secondary | ICD-10-CM | POA: Diagnosis not present

## 2019-06-08 DIAGNOSIS — I509 Heart failure, unspecified: Secondary | ICD-10-CM | POA: Diagnosis not present

## 2019-06-08 DIAGNOSIS — S22080D Wedge compression fracture of T11-T12 vertebra, subsequent encounter for fracture with routine healing: Secondary | ICD-10-CM | POA: Diagnosis not present

## 2019-06-08 DIAGNOSIS — R296 Repeated falls: Secondary | ICD-10-CM | POA: Diagnosis not present

## 2019-06-08 DIAGNOSIS — G309 Alzheimer's disease, unspecified: Secondary | ICD-10-CM | POA: Diagnosis not present

## 2019-06-10 DIAGNOSIS — Z20828 Contact with and (suspected) exposure to other viral communicable diseases: Secondary | ICD-10-CM | POA: Diagnosis not present

## 2019-06-11 ENCOUNTER — Encounter: Payer: Self-pay | Admitting: Nurse Practitioner

## 2019-06-11 ENCOUNTER — Non-Acute Institutional Stay (SKILLED_NURSING_FACILITY): Payer: Medicare Other | Admitting: Nurse Practitioner

## 2019-06-11 DIAGNOSIS — G250 Essential tremor: Secondary | ICD-10-CM

## 2019-06-11 DIAGNOSIS — M545 Low back pain, unspecified: Secondary | ICD-10-CM

## 2019-06-11 DIAGNOSIS — I130 Hypertensive heart and chronic kidney disease with heart failure and stage 1 through stage 4 chronic kidney disease, or unspecified chronic kidney disease: Secondary | ICD-10-CM | POA: Diagnosis not present

## 2019-06-11 DIAGNOSIS — R296 Repeated falls: Secondary | ICD-10-CM | POA: Diagnosis not present

## 2019-06-11 DIAGNOSIS — I482 Chronic atrial fibrillation, unspecified: Secondary | ICD-10-CM

## 2019-06-11 DIAGNOSIS — G309 Alzheimer's disease, unspecified: Secondary | ICD-10-CM | POA: Diagnosis not present

## 2019-06-11 DIAGNOSIS — F418 Other specified anxiety disorders: Secondary | ICD-10-CM | POA: Diagnosis not present

## 2019-06-11 DIAGNOSIS — N183 Chronic kidney disease, stage 3 unspecified: Secondary | ICD-10-CM | POA: Diagnosis not present

## 2019-06-11 DIAGNOSIS — F0150 Vascular dementia without behavioral disturbance: Secondary | ICD-10-CM

## 2019-06-11 DIAGNOSIS — F0280 Dementia in other diseases classified elsewhere without behavioral disturbance: Secondary | ICD-10-CM

## 2019-06-11 DIAGNOSIS — I509 Heart failure, unspecified: Secondary | ICD-10-CM | POA: Diagnosis not present

## 2019-06-11 LAB — CHLORIDE
Albumin: 3.7
Calcium: 9.9
Carbon Dioxide, Total: 33
Chloride: 103
Globulin: 2.4
Total Protein: 6.1 — AB (ref 6.4–8.2)

## 2019-06-11 NOTE — Assessment & Plan Note (Signed)
Her mood, weight are stable, continue Mirtazapine 15mg qd.  

## 2019-06-11 NOTE — Assessment & Plan Note (Signed)
Advanced dementia, continue SN[facility] for safety, care assistance, ambulates with walker.

## 2019-06-11 NOTE — Assessment & Plan Note (Signed)
Heart rate is in control.  

## 2019-06-11 NOTE — Assessment & Plan Note (Signed)
Stable, ambulates with walker, continue Tylenol 1000mg bid.  

## 2019-06-11 NOTE — Progress Notes (Addendum)
Location:   SNF [place] Room Number: 46 Place of Service:  SNF (31) Provider:  Omran Keelin NP  [name], MD  Mej[facility] Care Team: [name], MD as PCP - General (Internal Medicine) Nahser, Wonda Cheng, MD as PCP - Cardiology (Cardiology) Sherree Shankman X, NP as Nurse Practitioner (Internal Medicine)  Extended Emergency Contact Information Primary Emergency Contact: Angelica Mejia,Angelica Mejia Address: [address]          [place], [place] [address] Angelica Mejia of [place] Phone: [phone] Relation: Friend Secondary Emergency Contact: [place] of [place] Phone: [phone] Relation: Niece  Code Status:  DNR Goals of care: Advanced Directive information Advanced Directives 06/11/2019  Does Mejia Have a Medical Advance Directive? Yes  Type of Advance Directive Out of facility DNR (pink MOST or yellow form);Living will;Healthcare Power of Attorney  Does Mejia want to make changes to medical advance directive? No - Mejia declined  Copy of Angelica Mejia in Chart? Yes - validated most recent copy scanned in chart (See row information)  Would Mejia like information on creating a medical advance directive? -  Pre-existing out of facility DNR order (yellow form or pink MOST form) Yellow form placed in chart (order not valid for inpatient use)     Chief Complaint  Mejia presents with  . Medical Management of Chronic Issues  . Health Maintenance    Influenza vaccine    HPI:  Pt is a 85 y.o. female seen today for medical management of chronic diseases.    Angelica Mejia resides in SNF [facility] for safety, care assistance, ambulates with walker. Hx of AFib, heart rate is in control. Essential tremor, not disabling, on Propranolol 80mg  qd. Angelica Mejia mood is stable, on Mirtazapine 15mg  qd. OA, back pain, stable, on Tylenol 1000mg  bid.    Past Medical History:  Diagnosis Date  . Anticoagulant long-term use   . Atrial fibrillation ([place])    . CHF (congestive heart failure) ([place])   . Chronic anticoagulation   . Hypertension   . Raynaud phenomenon   . Tremor, essential    Past Surgical History:  Procedure Laterality Date  . CHOLECYSTECTOMY    . TONSILLECTOMY      Allergies  Allergen Reactions  . Fosamax [Alendronate]   . Other Nausea And Vomiting    Green pepper    Allergies as of 06/11/2019      Reactions   Fosamax [alendronate]    Other Nausea And Vomiting   Green pepper      Medication List       Accurate as of June 11, 2019  2:11 PM. If you have any questions, ask your nurse or doctor.        STOP taking these medications   erythromycin ophthalmic ointment Stopped by: Jessia Kief X Giovannie Scerbo, NP     TAKE these medications   acetaminophen 500 MG tablet Commonly known as: TYLENOL Take 500 mg by mouth every 8 (eight) hours as needed for mild pain or moderate pain.   acetaminophen 500 MG tablet Commonly known as: TYLENOL Take 1,000 mg by mouth 2 (two) times daily. Not to exceed 3,00 mg daily.   Artificial Tears 0.2-0.2-1 % Soln Generic drug: Glycerin-Hypromellose-PEG 400 Place 1 drop into both eyes 3 (three) times daily.   Caltrate 600+D Plus Minerals 600-800 MG-UNIT Chew Chew 1 tablet by mouth 2 (two) times a day.   magnesium hydroxide 400 MG/5ML suspension Commonly known as: MILK OF MAGNESIA Take  30 mLs by mouth daily as needed for mild constipation.   mirtazapine 15 MG tablet Commonly known as: REMERON Take 15 mg by mouth at bedtime.   propranolol ER 80 MG 24 hr capsule Commonly known as: INDERAL LA TAKE 1 CAPSULE BY MOUTH  DAILY   RESOURCE 2.0 PO Take 120 mLs by mouth 3 (three) times daily.   sennosides-docusate sodium 8.6-50 MG tablet Commonly known as: SENOKOT-S Take 2 tablets by mouth at bedtime.      ROS was provided with assistance of staff.  Review of Systems  Constitutional: Negative for activity change, appetite change, chills, diaphoresis, fatigue, fever and unexpected weight  change.  HENT: Positive for hearing loss. Negative for congestion and voice change.   Respiratory: Negative for cough, shortness of breath and wheezing.   Cardiovascular: Negative for chest pain, palpitations and leg swelling.  Gastrointestinal: Negative for abdominal distention, abdominal pain, constipation, diarrhea, nausea and vomiting.  Genitourinary: Negative for difficulty urinating, dysuria and urgency.  Musculoskeletal: Positive for arthralgias, back pain and gait problem.  Skin: Negative for color change and pallor.  Neurological: Positive for tremors. Negative for dizziness, speech difficulty, weakness and headaches.       Dementia  Psychiatric/Behavioral: Positive for confusion. Negative for agitation, behavioral problems, hallucinations and sleep disturbance.    Immunization History  Administered Date(s) Administered  . Influenza Whole 05/09/2018  . Influenza-Unspecified 05/28/2017  . Pneumococcal Conjugate-13 07/13/2017  . Pneumococcal Polysaccharide-23 04/07/2002  . Tdap 07/13/2017, 10/08/2017   Pertinent  Health Maintenance Due  Topic Date Due  . INFLUENZA VACCINE  03/08/2019  . DEXA SCAN  Completed  . PNA vac Low Risk Adult  Completed   Fall Risk  10/05/2017  Falls in Angelica past year? No   Functional Status Survey:    Vitals:   06/11/19 0905  BP: 120/68  Pulse: 72  Resp: 18  Temp: (!) 97.1 F (36.2 C)  SpO2: 91%  Weight: 109 lb (49.4 kg)  Height: 5\' 4"  (1.626 m)   Body mass index is 18.71 kg/m. Physical Exam Vitals signs and nursing note reviewed.  Constitutional:      General: She is not in acute distress.    Appearance: Normal appearance. She is not ill-appearing, toxic-appearing or diaphoretic.  HENT:     Head: Normocephalic and atraumatic.     Nose: Nose normal.     Mouth/Throat:     Mouth: Mucous membranes are moist.  Eyes:     Extraocular Movements: Extraocular movements intact.     Conjunctiva/sclera: Conjunctivae normal.     Pupils:  Pupils are equal, round, and reactive to light.  Neck:     Musculoskeletal: Normal range of motion.  Cardiovascular:     Rate and Rhythm: Normal rate. Rhythm irregular.     Heart sounds: No murmur.  Pulmonary:     Breath sounds: No wheezing, rhonchi or rales.  Abdominal:     General: Bowel sounds are normal. There is no distension.     Palpations: Abdomen is soft.     Tenderness: There is no abdominal tenderness. There is no right CVA tenderness, left CVA tenderness, guarding or rebound.  Musculoskeletal:     Comments: Ambulates with walker.  Skin:    General: Skin is warm and dry.  Neurological:     General: No focal deficit present.     Mental Status: She is alert. Mental status is at baseline.     Motor: No weakness.     Coordination: Coordination abnormal.  Gait: Gait abnormal.     Comments: Oriented to self.   Psychiatric:        Mood and Affect: Mood normal.        Behavior: Behavior normal.     Labs reviewed: Recent Labs    02/13/19 03/13/19 05/13/19 05/21/19  NA 147 144 142 144  K 4.3 3.9 3.8 3.7  CL 105  --  102 103  CO2 35  --  33 33  BUN 43* 28* 28* 26*  CREATININE 1.4* 1.3* 1.1 1.1  CALCIUM 10.9  --  9.5 9.9   Recent Labs    11/26/18 05/13/19 05/21/19  AST 15 14 13   ALT 6* 6* 5*  ALKPHOS 38 43 41  PROT  --  6.3* 6.1*  ALBUMIN  --  3.8 3.7   Recent Labs    11/26/18 05/13/19 05/21/19  WBC 7.8 8.1 9.1  NEUTROABS  --   --  5,651  HGB 13.0 12.5 12.0  HCT 38 37 36  PLT 160 204 183   Lab Results  Component Value Date   TSH 0.76 05/21/2019   No results found for: HGBA1C No results found for: CHOL, HDL, LDLCALC, LDLDIRECT, TRIG, CHOLHDL  Significant Diagnostic Results in last 30 days:  No results found.  Assessment/Plan Lower back pain Stable, ambulates with walker, continue Tylenol 1000mg  bid.   Mixed Alzheimer's and vascular dementia Advanced dementia, continue SNF [facility] for safety, care assistance, ambulates with walker.   Chronic a-fib  (HCC) Heart rate is in control.   Tremor, essential In hands, not disabling, continue Propranolol 80mg  qd.   Depression with anxiety Angelica Mejia mood, weight are stable, continue Mirtazapine 15mg  qd.      Family/ staff Communication: plan of care reviewed with Angelica Mejia and charge nurse.   Labs/tests ordered:  none  Time spend 25 minutes.

## 2019-06-11 NOTE — Assessment & Plan Note (Signed)
In hands, not disabling, continue Propranolol 80mg  qd.

## 2019-06-12 DIAGNOSIS — G309 Alzheimer's disease, unspecified: Secondary | ICD-10-CM | POA: Diagnosis not present

## 2019-06-12 DIAGNOSIS — I130 Hypertensive heart and chronic kidney disease with heart failure and stage 1 through stage 4 chronic kidney disease, or unspecified chronic kidney disease: Secondary | ICD-10-CM | POA: Diagnosis not present

## 2019-06-12 DIAGNOSIS — N183 Chronic kidney disease, stage 3 unspecified: Secondary | ICD-10-CM | POA: Diagnosis not present

## 2019-06-12 DIAGNOSIS — F0280 Dementia in other diseases classified elsewhere without behavioral disturbance: Secondary | ICD-10-CM | POA: Diagnosis not present

## 2019-06-12 DIAGNOSIS — I509 Heart failure, unspecified: Secondary | ICD-10-CM | POA: Diagnosis not present

## 2019-06-12 DIAGNOSIS — R296 Repeated falls: Secondary | ICD-10-CM | POA: Diagnosis not present

## 2019-06-17 DIAGNOSIS — I130 Hypertensive heart and chronic kidney disease with heart failure and stage 1 through stage 4 chronic kidney disease, or unspecified chronic kidney disease: Secondary | ICD-10-CM | POA: Diagnosis not present

## 2019-06-17 DIAGNOSIS — F0280 Dementia in other diseases classified elsewhere without behavioral disturbance: Secondary | ICD-10-CM | POA: Diagnosis not present

## 2019-06-17 DIAGNOSIS — R296 Repeated falls: Secondary | ICD-10-CM | POA: Diagnosis not present

## 2019-06-17 DIAGNOSIS — I509 Heart failure, unspecified: Secondary | ICD-10-CM | POA: Diagnosis not present

## 2019-06-17 DIAGNOSIS — N183 Chronic kidney disease, stage 3 unspecified: Secondary | ICD-10-CM | POA: Diagnosis not present

## 2019-06-17 DIAGNOSIS — G309 Alzheimer's disease, unspecified: Secondary | ICD-10-CM | POA: Diagnosis not present

## 2019-06-19 DIAGNOSIS — F0280 Dementia in other diseases classified elsewhere without behavioral disturbance: Secondary | ICD-10-CM | POA: Diagnosis not present

## 2019-06-19 DIAGNOSIS — G309 Alzheimer's disease, unspecified: Secondary | ICD-10-CM | POA: Diagnosis not present

## 2019-06-19 DIAGNOSIS — I130 Hypertensive heart and chronic kidney disease with heart failure and stage 1 through stage 4 chronic kidney disease, or unspecified chronic kidney disease: Secondary | ICD-10-CM | POA: Diagnosis not present

## 2019-06-19 DIAGNOSIS — N183 Chronic kidney disease, stage 3 unspecified: Secondary | ICD-10-CM | POA: Diagnosis not present

## 2019-06-19 DIAGNOSIS — I509 Heart failure, unspecified: Secondary | ICD-10-CM | POA: Diagnosis not present

## 2019-06-19 DIAGNOSIS — R296 Repeated falls: Secondary | ICD-10-CM | POA: Diagnosis not present

## 2019-06-24 DIAGNOSIS — Z20828 Contact with and (suspected) exposure to other viral communicable diseases: Secondary | ICD-10-CM | POA: Diagnosis not present

## 2019-06-26 DIAGNOSIS — N183 Chronic kidney disease, stage 3 unspecified: Secondary | ICD-10-CM | POA: Diagnosis not present

## 2019-06-26 DIAGNOSIS — F0280 Dementia in other diseases classified elsewhere without behavioral disturbance: Secondary | ICD-10-CM | POA: Diagnosis not present

## 2019-06-26 DIAGNOSIS — I509 Heart failure, unspecified: Secondary | ICD-10-CM | POA: Diagnosis not present

## 2019-06-26 DIAGNOSIS — I130 Hypertensive heart and chronic kidney disease with heart failure and stage 1 through stage 4 chronic kidney disease, or unspecified chronic kidney disease: Secondary | ICD-10-CM | POA: Diagnosis not present

## 2019-06-26 DIAGNOSIS — G309 Alzheimer's disease, unspecified: Secondary | ICD-10-CM | POA: Diagnosis not present

## 2019-06-26 DIAGNOSIS — R296 Repeated falls: Secondary | ICD-10-CM | POA: Diagnosis not present

## 2019-07-01 DIAGNOSIS — I130 Hypertensive heart and chronic kidney disease with heart failure and stage 1 through stage 4 chronic kidney disease, or unspecified chronic kidney disease: Secondary | ICD-10-CM | POA: Diagnosis not present

## 2019-07-01 DIAGNOSIS — R296 Repeated falls: Secondary | ICD-10-CM | POA: Diagnosis not present

## 2019-07-01 DIAGNOSIS — F0280 Dementia in other diseases classified elsewhere without behavioral disturbance: Secondary | ICD-10-CM | POA: Diagnosis not present

## 2019-07-01 DIAGNOSIS — G309 Alzheimer's disease, unspecified: Secondary | ICD-10-CM | POA: Diagnosis not present

## 2019-07-01 DIAGNOSIS — N183 Chronic kidney disease, stage 3 unspecified: Secondary | ICD-10-CM | POA: Diagnosis not present

## 2019-07-01 DIAGNOSIS — I509 Heart failure, unspecified: Secondary | ICD-10-CM | POA: Diagnosis not present

## 2019-07-07 ENCOUNTER — Encounter: Payer: Self-pay | Admitting: Nurse Practitioner

## 2019-07-07 ENCOUNTER — Non-Acute Institutional Stay (SKILLED_NURSING_FACILITY): Payer: Medicare Other | Admitting: Nurse Practitioner

## 2019-07-07 DIAGNOSIS — F418 Other specified anxiety disorders: Secondary | ICD-10-CM

## 2019-07-07 DIAGNOSIS — F0150 Vascular dementia without behavioral disturbance: Secondary | ICD-10-CM

## 2019-07-07 DIAGNOSIS — G250 Essential tremor: Secondary | ICD-10-CM

## 2019-07-07 DIAGNOSIS — M545 Low back pain, unspecified: Secondary | ICD-10-CM

## 2019-07-07 DIAGNOSIS — R269 Unspecified abnormalities of gait and mobility: Secondary | ICD-10-CM | POA: Diagnosis not present

## 2019-07-07 DIAGNOSIS — W19XXXD Unspecified fall, subsequent encounter: Secondary | ICD-10-CM

## 2019-07-07 DIAGNOSIS — G309 Alzheimer's disease, unspecified: Secondary | ICD-10-CM

## 2019-07-07 DIAGNOSIS — F0280 Dementia in other diseases classified elsewhere without behavioral disturbance: Secondary | ICD-10-CM | POA: Diagnosis not present

## 2019-07-07 NOTE — Assessment & Plan Note (Signed)
S/p Ortho, continue Tylenol 1000mg  bid.

## 2019-07-07 NOTE — Assessment & Plan Note (Signed)
Frequent falling, unsteady gait and lack of safety awareness are contributory, close supervision and assistance needed for safety.

## 2019-07-07 NOTE — Progress Notes (Signed)
Location:   SNF [facility] Room Number: 31 Place of Service:  SNF (31) Provider: [facility] Erice Ahles NP  Mahlon Gammon, MD  Patient Care Team: Mahlon Gammon, MD as PCP - General (Internal Medicine) Nahser, Deloris Ping, MD as PCP - Cardiology (Cardiology) Zayed Griffie X, NP as Nurse Practitioner (Internal Medicine)  Extended Emergency Contact Information Primary Emergency Contact: Morgan,Tom Address: [address]          [place], [place] [address] [place] of [place] Home Phone: [phone] Relation: Friend Secondary Emergency Contact: Brandy [place] of [place] Home Phone: [phone] Relation: Niece  Code Status: DNR Goals of care: Advanced Directive information Advanced Directives 06/11/2019  Does Patient Have a Medical Advance Directive? Yes  Type of Advance Directive Out of facility DNR (pink MOST or yellow form);Living will;Healthcare Power of Attorney  Does patient want to make changes to medical advance directive? No - Patient declined  Copy of Healthcare Power of Attorney in Chart? Yes - validated most recent copy scanned in chart (See row information)  Would patient like information on creating a medical advance directive? -  Pre-existing out of facility DNR order (yellow form or pink MOST form) Yellow form placed in chart (order not valid for inpatient use)     Chief Complaint  Patient presents with  . Acute Visit    fall    HPI:  Pt is a 85 y.o. female seen today for an acute visit for fall, the patient was found on the dayroom floor, no apparent injury. HPI was provided with assistance, the patient has no recollection of the event. Her mood is stable on Mirtazapine 15mg  qd. Essential tremor, fine in hands, not disabling, on Propranolol 80mg  qd. OA pain, multiple site/lower back, stable on Tylenol 1000mg  bid.    Past Medical History:  Diagnosis Date  . Anticoagulant long-term use   . Atrial fibrillation ([facility])   . CHF (congestive  heart failure) ([facility])   . Chronic anticoagulation   . Hypertension   . Raynaud phenomenon   . Tremor, essential    Past Surgical History:  Procedure Laterality Date  . CHOLECYSTECTOMY    . TONSILLECTOMY      Allergies  Allergen Reactions  . Fosamax [Alendronate]   . Other Nausea And Vomiting    Green pepper    Allergies as of 07/07/2019      Reactions   Fosamax [alendronate]    Other Nausea And Vomiting   Green pepper      Medication List       Accurate as of July 07, 2019  3:08 PM. If you have any questions, ask your nurse or doctor.        acetaminophen 500 MG tablet Commonly known as: TYLENOL Take 500 mg by mouth every 8 (eight) hours as needed for mild pain or moderate pain.   acetaminophen 500 MG tablet Commonly known as: TYLENOL Take 1,000 mg by mouth 2 (two) times daily. Not to exceed 3,00 mg daily.   Artificial Tears 0.2-0.2-1 % Soln Generic drug: Glycerin-Hypromellose-PEG 400 Place 1 drop into both eyes 3 (three) times daily.   Caltrate 600+D Plus Minerals 600-800 MG-UNIT Chew Chew 1 tablet by mouth 2 (two) times a day.   magnesium hydroxide 400 MG/5ML suspension Commonly known as: MILK OF MAGNESIA Take 30 mLs by mouth daily as needed for mild constipation.   mirtazapine 15 MG tablet Commonly known as: REMERON Take 15 mg by mouth at bedtime.  propranolol ER 80 MG 24 hr capsule Commonly known as: INDERAL LA TAKE 1 CAPSULE BY MOUTH  DAILY   RESOURCE 2.0 PO Take 120 mLs by mouth 3 (three) times daily.   sennosides-docusate sodium 8.6-50 MG tablet Commonly known as: SENOKOT-S Take 2 tablets by mouth at bedtime.      ROS was provided with assistance of staff.  Review of Systems  Constitutional: Negative for activity change, appetite change, chills, diaphoresis, fatigue and fever.  HENT: Positive for hearing loss. Negative for congestion and voice change.   Eyes: Negative for visual disturbance.  Respiratory: Negative for cough,  shortness of breath and wheezing.   Gastrointestinal: Negative for abdominal distention, abdominal pain, constipation, diarrhea, nausea and vomiting.  Genitourinary: Negative for difficulty urinating, dysuria and urgency.  Musculoskeletal: Positive for arthralgias, back pain and gait problem.  Skin: Negative for color change and pallor.  Neurological: Positive for tremors. Negative for dizziness, speech difficulty and headaches.       Dementia. Tremor in fingers.   Psychiatric/Behavioral: Positive for confusion. Negative for agitation, behavioral problems, hallucinations and sleep disturbance. The patient is not nervous/anxious.     Immunization History  Administered Date(s) Administered  . Influenza Whole 05/09/2018  . Influenza-Unspecified 05/28/2017  . Pneumococcal Conjugate-13 07/13/2017  . Pneumococcal Polysaccharide-23 04/07/2002  . Tdap 07/13/2017, 10/08/2017   Pertinent  Health Maintenance Due  Topic Date Due  . INFLUENZA VACCINE  03/08/2019  . DEXA SCAN  Completed  . PNA vac Low Risk Adult  Completed   Fall Risk  03/10/2019 10/05/2017  Falls in the past year? (No Data) No  Comment Emmi Telephone Survey: data to providers prior to load -  Number falls in past yr: (No Data) -  Comment Emmi Telephone Survey Actual Response =  -   Functional Status Survey:    Vitals:   07/07/19 1454  BP: 120/60  Pulse: 88  Resp: 16  Temp: (!) 97.1 F (36.2 C)  SpO2: 91%   There is no height or weight on file to calculate BMI. Physical Exam Vitals signs and nursing note reviewed.  Constitutional:      General: She is not in acute distress.    Appearance: Normal appearance. She is not ill-appearing, toxic-appearing or diaphoretic.  HENT:     Head: Normocephalic and atraumatic.     Nose: Nose normal.     Mouth/Throat:     Mouth: Mucous membranes are moist.  Eyes:     Extraocular Movements: Extraocular movements intact.     Conjunctiva/sclera: Conjunctivae normal.     Pupils:  Pupils are equal, round, and reactive to light.  Neck:     Musculoskeletal: Normal range of motion and neck supple.  Cardiovascular:     Rate and Rhythm: Normal rate. Rhythm irregular.     Heart sounds: No murmur.  Pulmonary:     Breath sounds: No wheezing or rales.  Abdominal:     General: Bowel sounds are normal. There is no distension.     Palpations: Abdomen is soft.     Tenderness: There is no abdominal tenderness. There is no right CVA tenderness, left CVA tenderness, guarding or rebound.  Musculoskeletal:     Right lower leg: No edema.     Left lower leg: No edema.  Skin:    General: Skin is warm and dry.  Neurological:     General: No focal deficit present.     Mental Status: She is alert. Mental status is at baseline.  Motor: No weakness.     Coordination: Coordination normal.     Gait: Gait abnormal.     Comments: Oriented to self.  Psychiatric:        Mood and Affect: Mood normal.     Labs reviewed: Recent Labs    02/13/19 03/13/19 05/13/19 05/21/19  NA 147 144 142 144  K 4.3 3.9 3.8 3.7  CL 105  --  102 103  CO2 35  --  33 33  BUN 43* 28* 28* 26*  CREATININE 1.4* 1.3* 1.1 1.1  CALCIUM 10.9  --  9.5 9.9   Recent Labs    11/26/18 05/13/19 05/21/19  AST 15 14 13   ALT 6* 6* 5*  ALKPHOS 38 43 41  PROT  --  6.3* 6.1*  ALBUMIN  --  3.8 3.7   Recent Labs    11/26/18 05/13/19 05/21/19  WBC 7.8 8.1 9.1  NEUTROABS  --   --  5,651  HGB 13.0 12.5 12.0  HCT 38 37 36  PLT 160 204 183   Lab Results  Component Value Date   TSH 0.76 05/21/2019   No results found for: HGBA1C No results found for: CHOL, HDL, LDLCALC, LDLDIRECT, TRIG, CHOLHDL  Significant Diagnostic Results in last 30 days:  No results found.  Assessment/Plan: Fall Frequent falling, unsteady gait and lack of safety awareness are contributory, close supervision and assistance needed for safety.   Gait abnormality Continue ambulating with walker, close supervision needed for safety.    Mixed Alzheimer's and vascular dementia Continue SNF [facility] for safety, care assistance.   Depression with anxiety Her mood is stable, continue Mirtazapine 15mg  qd.   Lower back pain S/p Ortho, continue Tylenol 1000mg  bid.   Tremor, essential Not disabling, continue Propranolol.     Family/ staff Communication: plan of care reviewed with the patient and charge nurse.   Labs/tests ordered:  None  Time spend 25 minutes.

## 2019-07-07 NOTE — Assessment & Plan Note (Signed)
Continue SNF [facility] for safety, care assistance.  

## 2019-07-07 NOTE — Assessment & Plan Note (Signed)
Continue ambulating with walker, close supervision needed for safety.

## 2019-07-07 NOTE — Assessment & Plan Note (Signed)
Her mood is stable, continue  Mirtazapine 15mg qd. 

## 2019-07-07 NOTE — Assessment & Plan Note (Signed)
Not disabling, continue Propranolol.

## 2019-07-14 ENCOUNTER — Non-Acute Institutional Stay (SKILLED_NURSING_FACILITY): Payer: Medicare Other | Admitting: Nurse Practitioner

## 2019-07-14 ENCOUNTER — Encounter: Payer: Self-pay | Admitting: Nurse Practitioner

## 2019-07-14 DIAGNOSIS — M545 Low back pain, unspecified: Secondary | ICD-10-CM

## 2019-07-14 DIAGNOSIS — R627 Adult failure to thrive: Secondary | ICD-10-CM | POA: Diagnosis not present

## 2019-07-14 DIAGNOSIS — F0280 Dementia in other diseases classified elsewhere without behavioral disturbance: Secondary | ICD-10-CM | POA: Diagnosis not present

## 2019-07-14 DIAGNOSIS — F0150 Vascular dementia without behavioral disturbance: Secondary | ICD-10-CM | POA: Diagnosis not present

## 2019-07-14 DIAGNOSIS — F339 Major depressive disorder, recurrent, unspecified: Secondary | ICD-10-CM | POA: Diagnosis not present

## 2019-07-14 DIAGNOSIS — G309 Alzheimer's disease, unspecified: Secondary | ICD-10-CM

## 2019-07-14 DIAGNOSIS — G250 Essential tremor: Secondary | ICD-10-CM | POA: Diagnosis not present

## 2019-07-14 NOTE — Assessment & Plan Note (Signed)
Stable, ambulates with walker, continue Tylenol 1000mg  bid.

## 2019-07-14 NOTE — Assessment & Plan Note (Signed)
Persisted gradual weight loss, #7Ibs in the past month, continue supportive care.

## 2019-07-14 NOTE — Assessment & Plan Note (Signed)
Her mood is stable, continue Mirtazapine.  °

## 2019-07-14 NOTE — Progress Notes (Signed)
Location:   S[facility] Room Number: 36 Place of Service:  SNF (31) Provider:  Lashara Urey NP  [name], MD  Patient Care Team: [name], MD as PCP - General (Internal Medicine) Nahser, Wonda Cheng, MD as PCP - Cardiology (Cardiology) Ramona Ruark X, NP as Nurse Practitioner (Internal Medicine)  Extended Emergency Contact Information Primary Emergency Contact: Morgan,Tom Address: [address]          [place], [place] [address] Johnnette Litter of [place] Phone: [phone] Relation: Friend Secondary Emergency Contact: [place] of [place] Phone: [phone] Relation: Niece  Code Status:  DNR Goals of care: Advanced Directive information Advanced Directives 07/14/2019  Does Patient Have a Medical Advance Directive? Yes  Type of Advance Directive Out of facility DNR (pink MOST or yellow form);Living will;Healthcare Power of Attorney  Does patient want to make changes to medical advance directive? No - Patient declined  Copy of [place] in Chart? Yes - validated most recent copy scanned in chart (See row information)  Would patient like information on creating a medical advance directive? -  Pre-existing out of facility DNR order (yellow form or pink MOST form) Yellow form placed in chart (order not valid for inpatient use)     Chief Complaint  Patient presents with  . Medical Management of Chronic Issues    HPI:  Pt is a 85 y.o. female seen today for medical management of chronic diseases.    The patient resides in SNF [facility] for safety, care assistance, gradual weight loss is persisted, #7Ibs in the past month regardless Mirtazapine 15mg  qd, her mood is stable. Essential tremor, stable, on Propranolol 80mg  qd. Chronic lower back pain, stable on Tylenol 1000mg  bid.    Past Medical History:  Diagnosis Date  . Anticoagulant long-term use   . Atrial fibrillation ([place])   . CHF (congestive heart failure)  ([place])   . Chronic anticoagulation   . Hypertension   . Raynaud phenomenon   . Tremor, essential    Past Surgical History:  Procedure Laterality Date  . CHOLECYSTECTOMY    . TONSILLECTOMY      Allergies  Allergen Reactions  . Fosamax [Alendronate]   . Other Nausea And Vomiting    Green pepper    Allergies as of 07/14/2019      Reactions   Fosamax [alendronate]    Other Nausea And Vomiting   Green pepper      Medication List       Accurate as of July 14, 2019  4:10 PM. If you have any questions, ask your nurse or doctor.        STOP taking these medications   magnesium hydroxide 400 MG/5ML suspension Commonly known as: MILK OF MAGNESIA Stopped by: Idris Edmundson X Mayce Noyes, NP     TAKE these medications   acetaminophen 500 MG tablet Commonly known as: TYLENOL Take 500 mg by mouth every 8 (eight) hours as needed for mild pain or moderate pain.   acetaminophen 500 MG tablet Commonly known as: TYLENOL Take 1,000 mg by mouth 2 (two) times daily. Not to exceed 3,00 mg daily.   Artificial Tears 0.2-0.2-1 % Soln Generic drug: Glycerin-Hypromellose-PEG 400 Place 1 drop into both eyes 3 (three) times daily.   bisacodyl 10 MG suppository Commonly known as: DULCOLAX 10 mg. May give Dulcolax (bisacodyl) suppository per rectum X 1 PRN for constipation. If no bowel movement in 24 hours, check rectum for hard  stool. Remove digitally if present. Give Fleet enema per rectum x1 dose after removing hard stool in rectum. DO NOT USE As Needed   Caltrate 600+D Plus Minerals 600-800 MG-UNIT Chew Chew 1 tablet by mouth 2 (two) times a day.   mirtazapine 15 MG tablet Commonly known as: REMERON Take 15 mg by mouth at bedtime.   propranolol ER 80 MG 24 hr capsule Commonly known as: INDERAL LA TAKE 1 CAPSULE BY MOUTH  DAILY   RESOURCE 2.0 PO Take 120 mLs by mouth 3 (three) times daily.   sennosides-docusate sodium 8.6-50 MG tablet Commonly known as: SENOKOT-S Take 2 tablets by mouth at  bedtime.      ROS was provided with assistance of staff.  Review of Systems  Constitutional: Positive for unexpected weight change. Negative for activity change.  HENT: Positive for hearing loss. Negative for congestion and voice change.   Eyes: Negative for visual disturbance.  Respiratory: Negative for cough, shortness of breath and wheezing.   Cardiovascular: Negative for chest pain, palpitations and leg swelling.  Gastrointestinal: Negative for abdominal distention, abdominal pain, constipation, diarrhea, nausea and vomiting.  Genitourinary: Negative for difficulty urinating, dysuria and urgency.  Musculoskeletal: Positive for arthralgias, back pain and gait problem.  Skin: Negative for color change and pallor.  Neurological: Positive for tremors. Negative for dizziness, speech difficulty, weakness and headaches.       Dementia  Psychiatric/Behavioral: Negative for agitation, behavioral problems, hallucinations and sleep disturbance. The patient is not nervous/anxious.     Immunization History  Administered Date(s) Administered  . Influenza Whole 05/09/2018  . Influenza-Unspecified 05/28/2017  . Pneumococcal Conjugate-13 07/13/2017  . Pneumococcal Polysaccharide-23 04/07/2002  . Tdap 07/13/2017, 10/08/2017   Pertinent  Health Maintenance Due  Topic Date Due  . INFLUENZA VACCINE  Completed  . DEXA SCAN  Completed  . PNA vac Low Risk Adult  Completed   Fall Risk  03/10/2019 10/05/2017  Falls in the past year? (No Data) No  Comment Emmi Telephone Survey: data to providers prior to load -  Number falls in past yr: (No Data) -  Comment Emmi Telephone Survey Actual Response =  -   Functional Status Survey:    Vitals:   07/14/19 1523  BP: 120/60  Pulse: 82  Resp: 18  Temp: (!) 96 F (35.6 C)  SpO2: 92%  Weight: 102 lb 9.6 oz (46.5 kg)  Height: 5\' 4"  (1.626 m)   Body mass index is 17.61 kg/m. Physical Exam Vitals signs and nursing note reviewed.  Constitutional:       General: She is not in acute distress.    Appearance: Normal appearance. She is not ill-appearing, toxic-appearing or diaphoretic.  HENT:     Head: Normocephalic and atraumatic.     Nose: Nose normal.     Mouth/Throat:     Mouth: Mucous membranes are moist.  Eyes:     Extraocular Movements: Extraocular movements intact.     Conjunctiva/sclera: Conjunctivae normal.     Pupils: Pupils are equal, round, and reactive to light.  Neck:     Musculoskeletal: Normal range of motion and neck supple.  Cardiovascular:     Rate and Rhythm: Normal rate. Rhythm irregular.     Heart sounds: No murmur.  Pulmonary:     Breath sounds: No wheezing, rhonchi or rales.  Abdominal:     General: Bowel sounds are normal. There is no distension.     Palpations: Abdomen is soft.     Tenderness: There is no  abdominal tenderness. There is no right CVA tenderness, guarding or rebound.  Musculoskeletal:     Right lower leg: No edema.     Left lower leg: No edema.  Skin:    General: Skin is warm and dry.  Neurological:     General: No focal deficit present.     Mental Status: She is alert. Mental status is at baseline.     Motor: No weakness.     Coordination: Coordination normal.     Gait: Gait abnormal.     Comments: Tremor in fingers. Ambulates with walker. Oriented to self.   Psychiatric:     Comments: Confused, but followed simple directions.      Labs reviewed: Recent Labs    02/13/19 03/13/19 05/13/19 05/21/19  NA 147 144 142 144  K 4.3 3.9 3.8 3.7  CL 105  --  102 103  CO2 35  --  33 33  BUN 43* 28* 28* 26*  CREATININE 1.4* 1.3* 1.1 1.1  CALCIUM 10.9  --  9.5 9.9   Recent Labs    11/26/18 05/13/19 05/21/19  AST 15 14 13   ALT 6* 6* 5*  ALKPHOS 38 43 41  PROT  --  6.3* 6.1*  ALBUMIN  --  3.8 3.7   Recent Labs    11/26/18 05/13/19 05/21/19  WBC 7.8 8.1 9.1  NEUTROABS  --   --  5,651  HGB 13.0 12.5 12.0  HCT 38 37 36  PLT 160 204 183   Lab Results  Component Value Date   TSH  0.76 05/21/2019   No results found for: HGBA1C No results found for: CHOL, HDL, LDLCALC, LDLDIRECT, TRIG, CHOLHDL  Significant Diagnostic Results in last 30 days:  No results found.  Assessment/Plan Adult failure to thrive Persisted gradual weight loss, #7Ibs in the past month, continue supportive care.   Depression, recurrent (HCC) Her mood is stable, continue Mirtazapine.   Lower back pain Stable, ambulates with walker, continue Tylenol 1000mg  bid.   Tremor, essential Fine tremor in fingers, continue Propranolol 80mg  qd   Mixed Alzheimer's and vascular dementia Progressing, continue SNF [facility] for safety, care assistance.      Family/ staff Communication: plan of care reviewed with the patient and charge nurse.   Labs/tests ordered:  none  Time spend 25 minutes.

## 2019-07-14 NOTE — Assessment & Plan Note (Signed)
Fine tremor in fingers, continue Propranolol 80mg  qd

## 2019-07-14 NOTE — Assessment & Plan Note (Signed)
Progressing, continue SNF [facility] for safety, care assistance.

## 2019-07-22 DIAGNOSIS — Z20828 Contact with and (suspected) exposure to other viral communicable diseases: Secondary | ICD-10-CM | POA: Diagnosis not present

## 2019-07-29 DIAGNOSIS — Z20828 Contact with and (suspected) exposure to other viral communicable diseases: Secondary | ICD-10-CM | POA: Diagnosis not present

## 2019-08-04 DIAGNOSIS — Z20828 Contact with and (suspected) exposure to other viral communicable diseases: Secondary | ICD-10-CM | POA: Diagnosis not present

## 2019-08-08 DIAGNOSIS — D631 Anemia in chronic kidney disease: Secondary | ICD-10-CM | POA: Diagnosis not present

## 2019-08-08 DIAGNOSIS — Z681 Body mass index (BMI) 19 or less, adult: Secondary | ICD-10-CM | POA: Diagnosis not present

## 2019-08-08 DIAGNOSIS — G250 Essential tremor: Secondary | ICD-10-CM | POA: Diagnosis not present

## 2019-08-08 DIAGNOSIS — F0280 Dementia in other diseases classified elsewhere without behavioral disturbance: Secondary | ICD-10-CM | POA: Diagnosis not present

## 2019-08-08 DIAGNOSIS — I4891 Unspecified atrial fibrillation: Secondary | ICD-10-CM | POA: Diagnosis not present

## 2019-08-08 DIAGNOSIS — H01003 Unspecified blepharitis right eye, unspecified eyelid: Secondary | ICD-10-CM | POA: Diagnosis not present

## 2019-08-08 DIAGNOSIS — Z947 Corneal transplant status: Secondary | ICD-10-CM | POA: Diagnosis not present

## 2019-08-08 DIAGNOSIS — I509 Heart failure, unspecified: Secondary | ICD-10-CM | POA: Diagnosis not present

## 2019-08-08 DIAGNOSIS — Z87440 Personal history of urinary (tract) infections: Secondary | ICD-10-CM | POA: Diagnosis not present

## 2019-08-08 DIAGNOSIS — E46 Unspecified protein-calorie malnutrition: Secondary | ICD-10-CM | POA: Diagnosis not present

## 2019-08-08 DIAGNOSIS — F418 Other specified anxiety disorders: Secondary | ICD-10-CM | POA: Diagnosis not present

## 2019-08-08 DIAGNOSIS — I130 Hypertensive heart and chronic kidney disease with heart failure and stage 1 through stage 4 chronic kidney disease, or unspecified chronic kidney disease: Secondary | ICD-10-CM | POA: Diagnosis not present

## 2019-08-08 DIAGNOSIS — I7300 Raynaud's syndrome without gangrene: Secondary | ICD-10-CM | POA: Diagnosis not present

## 2019-08-08 DIAGNOSIS — S22080D Wedge compression fracture of T11-T12 vertebra, subsequent encounter for fracture with routine healing: Secondary | ICD-10-CM | POA: Diagnosis not present

## 2019-08-08 DIAGNOSIS — R32 Unspecified urinary incontinence: Secondary | ICD-10-CM | POA: Diagnosis not present

## 2019-08-08 DIAGNOSIS — R159 Full incontinence of feces: Secondary | ICD-10-CM | POA: Diagnosis not present

## 2019-08-08 DIAGNOSIS — Z741 Need for assistance with personal care: Secondary | ICD-10-CM | POA: Diagnosis not present

## 2019-08-08 DIAGNOSIS — N183 Chronic kidney disease, stage 3 unspecified: Secondary | ICD-10-CM | POA: Diagnosis not present

## 2019-08-08 DIAGNOSIS — G309 Alzheimer's disease, unspecified: Secondary | ICD-10-CM | POA: Diagnosis not present

## 2019-08-08 DIAGNOSIS — R296 Repeated falls: Secondary | ICD-10-CM | POA: Diagnosis not present

## 2019-08-09 DIAGNOSIS — Z23 Encounter for immunization: Secondary | ICD-10-CM | POA: Diagnosis not present

## 2019-08-11 ENCOUNTER — Non-Acute Institutional Stay (SKILLED_NURSING_FACILITY): Payer: Medicare Other | Admitting: Nurse Practitioner

## 2019-08-11 ENCOUNTER — Encounter: Payer: Self-pay | Admitting: Nurse Practitioner

## 2019-08-11 DIAGNOSIS — G250 Essential tremor: Secondary | ICD-10-CM

## 2019-08-11 DIAGNOSIS — R269 Unspecified abnormalities of gait and mobility: Secondary | ICD-10-CM | POA: Diagnosis not present

## 2019-08-11 DIAGNOSIS — I509 Heart failure, unspecified: Secondary | ICD-10-CM | POA: Diagnosis not present

## 2019-08-11 DIAGNOSIS — R296 Repeated falls: Secondary | ICD-10-CM | POA: Diagnosis not present

## 2019-08-11 DIAGNOSIS — R627 Adult failure to thrive: Secondary | ICD-10-CM

## 2019-08-11 DIAGNOSIS — F0150 Vascular dementia without behavioral disturbance: Secondary | ICD-10-CM

## 2019-08-11 DIAGNOSIS — G309 Alzheimer's disease, unspecified: Secondary | ICD-10-CM | POA: Diagnosis not present

## 2019-08-11 DIAGNOSIS — W19XXXD Unspecified fall, subsequent encounter: Secondary | ICD-10-CM

## 2019-08-11 DIAGNOSIS — I482 Chronic atrial fibrillation, unspecified: Secondary | ICD-10-CM | POA: Diagnosis not present

## 2019-08-11 DIAGNOSIS — N183 Chronic kidney disease, stage 3 unspecified: Secondary | ICD-10-CM | POA: Diagnosis not present

## 2019-08-11 DIAGNOSIS — F0280 Dementia in other diseases classified elsewhere without behavioral disturbance: Secondary | ICD-10-CM | POA: Diagnosis not present

## 2019-08-11 DIAGNOSIS — I130 Hypertensive heart and chronic kidney disease with heart failure and stage 1 through stage 4 chronic kidney disease, or unspecified chronic kidney disease: Secondary | ICD-10-CM | POA: Diagnosis not present

## 2019-08-11 DIAGNOSIS — Z20828 Contact with and (suspected) exposure to other viral communicable diseases: Secondary | ICD-10-CM | POA: Diagnosis not present

## 2019-08-11 NOTE — Progress Notes (Signed)
Location:   [facility] Room Number: 31 Place of Service:  SNF (31) Provider:  Hayven Croy NP  [name], MD  Patient Care Team: [name], MD as PCP - General (Internal Medicine) Nahser, Wonda Cheng, MD as PCP - Cardiology (Cardiology) Verlena Marlette X, NP as Nurse Practitioner (Internal Medicine)  Extended Emergency Contact Information Primary Emergency Contact: Morgan,Tom Address: [address]          [place], [place] [address] Johnnette Litter of [place] Phone: [phone] Relation: Friend Secondary Emergency Contact: [place] of [place] Phone: [phone] Relation: Niece  Code Status:  DNR Goals of care: Advanced Directive information Advanced Directives 07/14/2019  Does Patient Have a Medical Advance Directive? Yes  Type of Advance Directive Out of facility DNR (pink MOST or yellow form);Living will;Healthcare Power of Attorney  Does patient want to make changes to medical advance directive? No - Patient declined  Copy of [place] in Chart? Yes - validated most recent copy scanned in chart (See row information)  Would patient like information on creating a medical advance directive? -  Pre-existing out of facility DNR order (yellow form or pink MOST form) Yellow form placed in chart (order not valid for inpatient use)     Chief Complaint  Patient presents with  . Acute Visit    Agitation, fall    HPI:  Pt is a 86 y.o. female seen today for an acute visit for fall, agitation. Hx of dementia, the patient resides in SNF [facility] for safety and care assistance, her gaol of care is comfort measures, under Hospice service. She continues weight loss, progressing of dementia, more frequency of emotional outburst. She takes Mirtazapine 15mg  qd. Afib, heart rate is in control. Essential tremor, more pronounced in fingers, on Propranolol.    Past Medical History:  Diagnosis Date  . Anticoagulant  long-term use   . Atrial fibrillation ([place])   . CHF (congestive heart failure) ([place])   . Chronic anticoagulation   . Hypertension   . Raynaud phenomenon   . Tremor, essential    Past Surgical History:  Procedure Laterality Date  . CHOLECYSTECTOMY    . TONSILLECTOMY      Allergies  Allergen Reactions  . Fosamax [Alendronate]   . Other Nausea And Vomiting    Green pepper    Allergies as of 08/11/2019      Reactions   Fosamax [alendronate]    Other Nausea And Vomiting   Green pepper      Medication List       Accurate as of August 11, 2019 11:59 PM. If you have any questions, ask your nurse or doctor.        STOP taking these medications   bisacodyl 10 MG suppository Commonly known as: DULCOLAX Stopped by: Izza Bickle X Keeton Kassebaum, NP     TAKE these medications   acetaminophen 500 MG tablet Commonly known as: TYLENOL Take 500 mg by mouth every 8 (eight) hours as needed for mild pain or moderate pain.   acetaminophen 500 MG tablet Commonly known as: TYLENOL Take 1,000 mg by mouth 2 (two) times daily. Not to exceed 3,00 mg daily.   Artificial Tears 0.2-0.2-1 % Soln Generic drug: Glycerin-Hypromellose-PEG 400 Place 1 drop into both eyes 3 (three) times daily.   Caltrate 600+D Plus Minerals 600-800 MG-UNIT Chew Chew 1 tablet by mouth 2 (two) times a day.   mirtazapine 15 MG tablet Commonly  known as: REMERON Take 15 mg by mouth at bedtime.   propranolol ER 80 MG 24 hr capsule Commonly known as: INDERAL LA TAKE 1 CAPSULE BY MOUTH  DAILY   RESOURCE 2.0 PO Take 120 mLs by mouth 3 (three) times daily.   sennosides-docusate sodium 8.6-50 MG tablet Commonly known as: SENOKOT-S Take 2 tablets by mouth at bedtime.      ROS was provided with assistance of staff.  Review of Systems  Constitutional: Positive for activity change, appetite change and fatigue. Negative for chills, diaphoresis and fever.  HENT: Positive for hearing loss. Negative for congestion and voice change.    Eyes: Negative for visual disturbance.  Respiratory: Negative for cough, shortness of breath and wheezing.   Cardiovascular: Negative for chest pain, palpitations and leg swelling.  Gastrointestinal: Negative for abdominal distention, abdominal pain, constipation, diarrhea, nausea and vomiting.  Genitourinary: Negative for difficulty urinating, dysuria and urgency.  Musculoskeletal: Positive for arthralgias, back pain and gait problem.  Skin: Positive for wound.       Skin tear/abrasion left face.   Neurological: Positive for tremors. Negative for dizziness, speech difficulty, weakness and headaches.       Dementia.   Psychiatric/Behavioral: Positive for agitation, behavioral problems, confusion and dysphoric mood. Negative for hallucinations and sleep disturbance. The patient is nervous/anxious.     Immunization History  Administered Date(s) Administered  . Influenza Whole 05/09/2018  . Influenza-Unspecified 05/28/2017  . Pneumococcal Conjugate-13 07/13/2017  . Pneumococcal Polysaccharide-23 04/07/2002  . Tdap 07/13/2017, 10/08/2017   Pertinent  Health Maintenance Due  Topic Date Due  . INFLUENZA VACCINE  Completed  . DEXA SCAN  Completed  . PNA vac Low Risk Adult  Completed   Fall Risk  03/10/2019 10/05/2017  Falls in the past year? (No Data) No  Comment Emmi Telephone Survey: data to providers prior to load -  Number falls in past yr: (No Data) -  Comment Emmi Telephone Survey Actual Response =  -   Functional Status Survey:    Vitals:   08/11/19 1547  BP: 124/72  Pulse: 88  Resp: 20  Temp: (!) 97.4 F (36.3 C)  SpO2: 90%  Weight: 101 lb 12.8 oz (46.2 kg)  Height: 5\' 4"  (1.626 m)   Body mass index is 17.47 kg/m. Physical Exam Vitals and nursing note reviewed.  Constitutional:      General: She is not in acute distress.    Appearance: She is not ill-appearing, toxic-appearing or diaphoretic.     Comments: Under weight, frail.   HENT:     Head: Normocephalic  and atraumatic.     Nose: Nose normal.     Mouth/Throat:     Mouth: Mucous membranes are dry.  Eyes:     Extraocular Movements: Extraocular movements intact.     Conjunctiva/sclera: Conjunctivae normal.     Pupils: Pupils are equal, round, and reactive to light.  Cardiovascular:     Rate and Rhythm: Normal rate. Rhythm irregular.     Heart sounds: No murmur.  Pulmonary:     Effort: Pulmonary effort is normal.     Breath sounds: Normal breath sounds. No wheezing, rhonchi or rales.  Abdominal:     General: Bowel sounds are normal. There is no distension.     Palpations: Abdomen is soft.     Tenderness: There is no abdominal tenderness. There is no right CVA tenderness, left CVA tenderness, guarding or rebound.  Musculoskeletal:     Cervical back: Normal range of motion  and neck supple.     Right lower leg: No edema.     Left lower leg: No edema.  Skin:    General: Skin is warm and dry.     Comments: Left face skin tear/abrasion  Neurological:     General: No focal deficit present.     Mental Status: She is alert. Mental status is at baseline.     Motor: No weakness.     Coordination: Coordination normal.     Gait: Gait abnormal.     Comments: Oriented to self. Tremors in fingers.   Psychiatric:     Comments: Confused.      Labs reviewed: Recent Labs    02/13/19 0000 03/13/19 0000 05/13/19 0000 05/21/19 0000  NA 147 144 142 144  K 4.3 3.9 3.8 3.7  CL 105  --  102 103  CO2 35  --  33 33  BUN 43* 28* 28* 26*  CREATININE 1.4* 1.3* 1.1 1.1  CALCIUM 10.9  --  9.5 9.9   Recent Labs    11/26/18 0000 05/13/19 0000 05/21/19 0000  AST 15 14 13   ALT 6* 6* 5*  ALKPHOS 38 43 41  PROT  --  6.3* 6.1*  ALBUMIN  --  3.8 3.7   Recent Labs    11/26/18 0000 05/13/19 0000 05/21/19 0000  WBC 7.8 8.1 9.1  NEUTROABS  --   --  5,651  HGB 13.0 12.5 12.0  HCT 38 37 36  PLT 160 204 183   Lab Results  Component Value Date   TSH 0.76 05/21/2019   No results found for:  HGBA1C No results found for: CHOL, HDL, LDLCALC, LDLDIRECT, TRIG, CHOLHDL  Significant Diagnostic Results in last 30 days:  No results found.  Assessment/Plan Fall 08/09/18 fall, left lower jaw scrap, steri strips closure, the resident got up from recliner, lost balance, then fell. Lack of safety awareness, increased frailty are contributory, close supervision for safety.   Gait abnormality Worsened in setting of increased frailty. Close supervision for safety.   Mixed Alzheimer's and vascular dementia (HCC) 08/11/19 plan of care: comfort measures, continue Hospice service, Ativan 0.25mg  q6h prn for agitation/anxiety. Adult failure to thrive wt # 103Ibs 08/02/19  Chronic a-fib (HCC) Heart rate is in control.   Tremor, essential More noticeable in fingers, continue Propranolol.   Adult failure to thrive Weight loss about #10Ibs in the past 2-3 months, continue declined in cognition and mood. Continue supportive care.      Family/ staff Communication: plan of care reviewed with the patient and charge nurse.   Labs/tests ordered:  none  Time spend 25 minutes.

## 2019-08-12 ENCOUNTER — Encounter: Payer: Self-pay | Admitting: Nurse Practitioner

## 2019-08-12 ENCOUNTER — Other Ambulatory Visit: Payer: Self-pay | Admitting: *Deleted

## 2019-08-12 MED ORDER — LORAZEPAM 0.5 MG PO TABS: 0.2500 mg | ORAL_TABLET | Freq: Four times a day (QID) | ORAL | 0 refills | Status: DC | PRN

## 2019-08-12 NOTE — Telephone Encounter (Signed)
Received fax from [facility] °Pended Rx and sent to [facility] for approval.  °

## 2019-08-12 NOTE — Assessment & Plan Note (Signed)
Heart rate is in control.  

## 2019-08-12 NOTE — Assessment & Plan Note (Signed)
08/11/19 plan of care: comfort measures, continue Hospice service, Ativan 0.25mg  q6h prn for agitation/anxiety. Adult failure to thrive wt # 103Ibs 08/02/19

## 2019-08-12 NOTE — Assessment & Plan Note (Signed)
Worsened in setting of increased frailty. Close supervision for safety.

## 2019-08-12 NOTE — Assessment & Plan Note (Signed)
More noticeable in fingers, continue Propranolol.

## 2019-08-12 NOTE — Assessment & Plan Note (Signed)
08/09/18 fall, left lower jaw scrap, steri strips closure, the resident got up from recliner, lost balance, then fell. Lack of safety awareness, increased frailty are contributory, close supervision for safety.

## 2019-08-12 NOTE — Assessment & Plan Note (Signed)
Weight loss about #10Ibs in the past 2-3 months, continue declined in cognition and mood. Continue supportive care.

## 2019-08-15 ENCOUNTER — Non-Acute Institutional Stay (SKILLED_NURSING_FACILITY): Payer: Medicare Other | Admitting: Nurse Practitioner

## 2019-08-15 ENCOUNTER — Encounter: Payer: Self-pay | Admitting: Nurse Practitioner

## 2019-08-15 DIAGNOSIS — R0902 Hypoxemia: Secondary | ICD-10-CM

## 2019-08-15 DIAGNOSIS — G250 Essential tremor: Secondary | ICD-10-CM

## 2019-08-15 DIAGNOSIS — F339 Major depressive disorder, recurrent, unspecified: Secondary | ICD-10-CM

## 2019-08-15 DIAGNOSIS — I7300 Raynaud's syndrome without gangrene: Secondary | ICD-10-CM

## 2019-08-15 DIAGNOSIS — F0150 Vascular dementia without behavioral disturbance: Secondary | ICD-10-CM | POA: Diagnosis not present

## 2019-08-15 DIAGNOSIS — I482 Chronic atrial fibrillation, unspecified: Secondary | ICD-10-CM | POA: Diagnosis not present

## 2019-08-15 DIAGNOSIS — Z7189 Other specified counseling: Secondary | ICD-10-CM

## 2019-08-15 DIAGNOSIS — F0280 Dementia in other diseases classified elsewhere without behavioral disturbance: Secondary | ICD-10-CM | POA: Diagnosis not present

## 2019-08-15 DIAGNOSIS — G309 Alzheimer's disease, unspecified: Secondary | ICD-10-CM

## 2019-08-15 DIAGNOSIS — N183 Chronic kidney disease, stage 3 unspecified: Secondary | ICD-10-CM | POA: Diagnosis not present

## 2019-08-15 DIAGNOSIS — I509 Heart failure, unspecified: Secondary | ICD-10-CM | POA: Diagnosis not present

## 2019-08-15 DIAGNOSIS — K5901 Slow transit constipation: Secondary | ICD-10-CM

## 2019-08-15 DIAGNOSIS — M545 Low back pain, unspecified: Secondary | ICD-10-CM

## 2019-08-15 DIAGNOSIS — R296 Repeated falls: Secondary | ICD-10-CM | POA: Diagnosis not present

## 2019-08-15 DIAGNOSIS — I130 Hypertensive heart and chronic kidney disease with heart failure and stage 1 through stage 4 chronic kidney disease, or unspecified chronic kidney disease: Secondary | ICD-10-CM | POA: Diagnosis not present

## 2019-08-15 NOTE — Assessment & Plan Note (Signed)
Continue Hospice service 

## 2019-08-15 NOTE — Assessment & Plan Note (Signed)
Continue SNF [facility] for safety, care assistance, Hospice service.  

## 2019-08-15 NOTE — Assessment & Plan Note (Signed)
Stable, continue Senokot S 

## 2019-08-15 NOTE — Progress Notes (Signed)
Location:   [facility] Room Number: 41 Place of Service:  SNF (31) Provider: [facility] Jevonte Clanton NP  Mahlon Gammon, MD  Patient Care Team: Mahlon Gammon, MD as PCP - General (Internal Medicine) Nahser, Deloris Ping, MD as PCP - Cardiology (Cardiology) Grazia Taffe X, NP as Nurse Practitioner (Internal Medicine)  Extended Emergency Contact Information Primary Emergency Contact: Morgan,Tom Address: [address]          [place], [place] [address] [place] of [place] Home Phone: [phone] Relation: Friend Secondary Emergency Contact: Brandy Hale [place] of [place] Home Phone: [phone] Relation: Niece  Code Status: DNR Goals of care: Advanced Directive information Advanced Directives 07/14/2019  Does Patient Have a Medical Advance Directive? Yes  Type of Advance Directive Out of facility DNR (pink MOST or yellow form);Living will;Healthcare Power of Attorney  Does patient want to make changes to medical advance directive? No - Patient declined  Copy of Healthcare Power of Attorney in Chart? Yes - validated most recent copy scanned in chart (See row information)  Would patient like information on creating a medical advance directive? -  Pre-existing out of facility DNR order (yellow form or pink MOST form) Yellow form placed in chart (order not valid for inpatient use)     Chief Complaint  Patient presents with  . Acute Visit    O2 desaturation    HPI:  Pt is a 86 y.o. female seen today for an acute visit for O2 desaturation, the patient has chronic DOE, no noted increased SOB, cough, or sputum production. HPI was provided with assistance of staff. The patient resides in SNF [facility] for safety, care assistance, under Hospice service. She ambulates with walker on unit. Hx of essential tremor in fingers are more pronounced. Hx of Afib, heart rate is controlled, on Propranolol 80mg  qd. Her mood is stable, on Mirtazapine 15mg  qd, prn Lorazepam. No constipation,  on Senokot S II qhs. Lower back pain is controlled on Tylenol 1000mg  bid.    Past Medical History:  Diagnosis Date  . Anticoagulant long-term use   . Atrial fibrillation (HCC)   . CHF (congestive heart failure) (HCC)   . Chronic anticoagulation   . Hypertension   . Raynaud phenomenon   . Tremor, essential    Past Surgical History:  Procedure Laterality Date  . CHOLECYSTECTOMY    . TONSILLECTOMY      Allergies  Allergen Reactions  . Fosamax [Alendronate]   . Other Nausea And Vomiting    Green pepper    Allergies as of 08/15/2019      Reactions   Fosamax [alendronate]    Other Nausea And Vomiting   Green pepper      Medication List       Accurate as of August 15, 2019  4:41 PM. If you have any questions, ask your nurse or doctor.        acetaminophen 500 MG tablet Commonly known as: TYLENOL Take 500 mg by mouth every 8 (eight) hours as needed for mild pain or moderate pain.   acetaminophen 500 MG tablet Commonly known as: TYLENOL Take 1,000 mg by mouth 2 (two) times daily. Not to exceed 3,00 mg daily.   Artificial Tears 0.2-0.2-1 % Soln Generic drug: Glycerin-Hypromellose-PEG 400 Place 1 drop into both eyes 3 (three) times daily.   Caltrate 600+D Plus Minerals 600-800 MG-UNIT Chew Chew 1 tablet by mouth 2 (two) times a day.   LORazepam 0.5 MG tablet Commonly known as:  ATIVAN Take 0.5 tablets (0.25 mg total) by mouth every 6 (six) hours as needed for anxiety.   mirtazapine 15 MG tablet Commonly known as: REMERON Take 15 mg by mouth at bedtime.   propranolol ER 80 MG 24 hr capsule Commonly known as: INDERAL LA TAKE 1 CAPSULE BY MOUTH  DAILY   RESOURCE 2.0 PO Take 120 mLs by mouth 3 (three) times daily.   sennosides-docusate sodium 8.6-50 MG tablet Commonly known as: SENOKOT-S Take 2 tablets by mouth at bedtime.      ROS was provided with assistance of staff Review of Systems  Constitutional: Negative for activity change, appetite change, chills,  diaphoresis, fatigue and fever.  HENT: Positive for hearing loss. Negative for congestion and voice change.   Eyes: Negative for visual disturbance.  Respiratory: Positive for shortness of breath. Negative for cough and wheezing.   Gastrointestinal: Negative for abdominal distention, abdominal pain, constipation, diarrhea and vomiting.  Genitourinary: Negative for difficulty urinating, dysuria and urgency.  Musculoskeletal: Positive for back pain and gait problem.  Skin: Negative for color change and pallor.  Neurological: Positive for tremors. Negative for dizziness, speech difficulty, weakness and headaches.       Dementia. Tremor in fingers.   Psychiatric/Behavioral: Positive for agitation, behavioral problems, confusion and dysphoric mood. Negative for hallucinations and sleep disturbance. The patient is not nervous/anxious.     Immunization History  Administered Date(s) Administered  . Influenza Whole 05/09/2018  . Influenza-Unspecified 05/28/2017  . Pneumococcal Conjugate-13 07/13/2017  . Pneumococcal Polysaccharide-23 04/07/2002  . Tdap 07/13/2017, 10/08/2017   Pertinent  Health Maintenance Due  Topic Date Due  . INFLUENZA VACCINE  Completed  . DEXA SCAN  Completed  . PNA vac Low Risk Adult  Completed   Fall Risk  03/10/2019 10/05/2017  Falls in the past year? (No Data) No  Comment Emmi Telephone Survey: data to providers prior to load -  Number falls in past yr: (No Data) -  Comment Emmi Telephone Survey Actual Response =  -   Functional Status Survey:    Vitals:   08/15/19 1259  BP: 116/70  Pulse: 72  Resp: 18  Temp: 98.1 F (36.7 C)  SpO2: (!) 87%   There is no height or weight on file to calculate BMI. Physical Exam Vitals and nursing note reviewed.  Constitutional:      General: She is not in acute distress.    Appearance: Normal appearance. She is not ill-appearing, toxic-appearing or diaphoretic.  HENT:     Head: Normocephalic and atraumatic.     Nose:  Nose normal.     Mouth/Throat:     Mouth: Mucous membranes are moist.  Eyes:     Extraocular Movements: Extraocular movements intact.     Conjunctiva/sclera: Conjunctivae normal.     Pupils: Pupils are equal, round, and reactive to light.  Cardiovascular:     Rate and Rhythm: Normal rate. Rhythm irregular.     Heart sounds: No murmur.  Pulmonary:     Breath sounds: No wheezing or rhonchi.  Abdominal:     General: Bowel sounds are normal. There is no distension.     Palpations: Abdomen is soft.     Tenderness: There is no abdominal tenderness. There is no right CVA tenderness, left CVA tenderness, guarding or rebound.  Musculoskeletal:     Cervical back: Normal range of motion and neck supple.     Right lower leg: No edema.     Left lower leg: No edema.  Skin:  General: Skin is warm.  Neurological:     General: No focal deficit present.     Mental Status: She is alert. Mental status is at baseline.     Cranial Nerves: No cranial nerve deficit.     Motor: No weakness.     Gait: Gait abnormal.     Comments: Oriented to self, tremor in fingers.   Psychiatric:     Comments: confused     Labs reviewed: Recent Labs    02/13/19 0000 03/13/19 0000 05/13/19 0000 05/21/19 0000  NA 147 144 142 144  K 4.3 3.9 3.8 3.7  CL 105  --  102 103  CO2 35  --  33 33  BUN 43* 28* 28* 26*  CREATININE 1.4* 1.3* 1.1 1.1  CALCIUM 10.9  --  9.5 9.9   Recent Labs    11/26/18 0000 05/13/19 0000 05/21/19 0000  AST 15 14 13   ALT 6* 6* 5*  ALKPHOS 38 43 41  PROT  --  6.3* 6.1*  ALBUMIN  --  3.8 3.7   Recent Labs    11/26/18 0000 05/13/19 0000 05/21/19 0000  WBC 7.8 8.1 9.1  NEUTROABS  --   --  5,651  HGB 13.0 12.5 12.0  HCT 38 37 36  PLT 160 204 183   Lab Results  Component Value Date   TSH 0.76 05/21/2019   No results found for: HGBA1C No results found for: CHOL, HDL, LDLCALC, LDLDIRECT, TRIG, CHOLHDL  Significant Diagnostic Results in last 30 days:  No results  found.  Assessment/Plan: Advanced care planning/counseling discussion Continue Hospice service.   Hypoxia Not sure if fingertip pulse oximeter is able to determine hypoxia since the patient has history of Raynaud Phenomenon, worsened tremors in fingers, and AFib. Will continue to monitor the patient for s/s respiratory symptoms,  the Hospice nurse will follow up on the patient.   Raynaud phenomenon Not seeing change of color to white, blue, or red in fingers upon my examination.   Tremor, essential More noticeable tremors in fingers. Continue Propranolol.   Chronic a-fib (HCC) Heart rate is in control, continue Propranolol.   Mixed Alzheimer's and vascular dementia (HCC) Continue SNF [facility] for safety, care assistance, Hospice service.   Depression, recurrent (HCC) Her mood is managed, continue Mirtazapine, prn Lorazepam.   Lower back pain Stable, continue Tylenol.   Slow transit constipation Stable, continue Senokot S    Family/ staff Communication: plan of care reviewed with the patient and charge nurse.   Labs/tests ordered: none  Time spend 25 minutes.

## 2019-08-15 NOTE — Assessment & Plan Note (Signed)
Stable, continue Tylenol.  

## 2019-08-15 NOTE — Assessment & Plan Note (Signed)
Not seeing change of color to white, blue, or red in fingers upon my examination.

## 2019-08-15 NOTE — Assessment & Plan Note (Signed)
Her mood is managed, continue Mirtazapine, prn Lorazepam.

## 2019-08-15 NOTE — Assessment & Plan Note (Addendum)
More noticeable tremors in fingers. Continue Propranolol.

## 2019-08-15 NOTE — Assessment & Plan Note (Signed)
Heart rate is in control, continue Propranolol.  

## 2019-08-15 NOTE — Assessment & Plan Note (Signed)
Not sure if fingertip pulse oximeter is able to determine hypoxia since the patient has history of Raynaud Phenomenon, worsened tremors in fingers, and AFib. Will continue to monitor the patient for s/s respiratory symptoms,  the Hospice nurse will follow up on the patient.

## 2019-08-18 DIAGNOSIS — R296 Repeated falls: Secondary | ICD-10-CM | POA: Diagnosis not present

## 2019-08-18 DIAGNOSIS — F0280 Dementia in other diseases classified elsewhere without behavioral disturbance: Secondary | ICD-10-CM | POA: Diagnosis not present

## 2019-08-18 DIAGNOSIS — N183 Chronic kidney disease, stage 3 unspecified: Secondary | ICD-10-CM | POA: Diagnosis not present

## 2019-08-18 DIAGNOSIS — Z20828 Contact with and (suspected) exposure to other viral communicable diseases: Secondary | ICD-10-CM | POA: Diagnosis not present

## 2019-08-18 DIAGNOSIS — I509 Heart failure, unspecified: Secondary | ICD-10-CM | POA: Diagnosis not present

## 2019-08-18 DIAGNOSIS — I130 Hypertensive heart and chronic kidney disease with heart failure and stage 1 through stage 4 chronic kidney disease, or unspecified chronic kidney disease: Secondary | ICD-10-CM | POA: Diagnosis not present

## 2019-08-18 DIAGNOSIS — G309 Alzheimer's disease, unspecified: Secondary | ICD-10-CM | POA: Diagnosis not present

## 2019-08-22 ENCOUNTER — Non-Acute Institutional Stay (SKILLED_NURSING_FACILITY): Payer: Medicare Other | Admitting: Nurse Practitioner

## 2019-08-22 ENCOUNTER — Encounter: Payer: Self-pay | Admitting: Nurse Practitioner

## 2019-08-22 DIAGNOSIS — I482 Chronic atrial fibrillation, unspecified: Secondary | ICD-10-CM | POA: Diagnosis not present

## 2019-08-22 DIAGNOSIS — R627 Adult failure to thrive: Secondary | ICD-10-CM

## 2019-08-22 DIAGNOSIS — F0150 Vascular dementia without behavioral disturbance: Secondary | ICD-10-CM

## 2019-08-22 DIAGNOSIS — I130 Hypertensive heart and chronic kidney disease with heart failure and stage 1 through stage 4 chronic kidney disease, or unspecified chronic kidney disease: Secondary | ICD-10-CM | POA: Diagnosis not present

## 2019-08-22 DIAGNOSIS — F0280 Dementia in other diseases classified elsewhere without behavioral disturbance: Secondary | ICD-10-CM

## 2019-08-22 DIAGNOSIS — I509 Heart failure, unspecified: Secondary | ICD-10-CM | POA: Diagnosis not present

## 2019-08-22 DIAGNOSIS — Z7189 Other specified counseling: Secondary | ICD-10-CM | POA: Diagnosis not present

## 2019-08-22 DIAGNOSIS — M545 Low back pain, unspecified: Secondary | ICD-10-CM

## 2019-08-22 DIAGNOSIS — G309 Alzheimer's disease, unspecified: Secondary | ICD-10-CM | POA: Diagnosis not present

## 2019-08-22 DIAGNOSIS — I10 Essential (primary) hypertension: Secondary | ICD-10-CM

## 2019-08-22 DIAGNOSIS — F339 Major depressive disorder, recurrent, unspecified: Secondary | ICD-10-CM | POA: Diagnosis not present

## 2019-08-22 DIAGNOSIS — N183 Chronic kidney disease, stage 3 unspecified: Secondary | ICD-10-CM | POA: Diagnosis not present

## 2019-08-22 DIAGNOSIS — R296 Repeated falls: Secondary | ICD-10-CM | POA: Diagnosis not present

## 2019-08-22 MED ORDER — QUETIAPINE FUMARATE 25 MG PO TABS: 12.5000 mg | ORAL_TABLET | Freq: Every day | ORAL | 0 refills | Status: DC

## 2019-08-22 NOTE — Assessment & Plan Note (Signed)
08/22/19 MOST comfort measures, no IVF, ABT determine use or limitation of antibiotics when infection occurs.

## 2019-08-22 NOTE — Assessment & Plan Note (Signed)
No c/o pain in her back, ambulates with walker, no s/s of pain observed, continue Tylenol. 1000mg  bid.

## 2019-08-22 NOTE — Assessment & Plan Note (Signed)
Continue SNF [facility] for safety, care assistance, continue Hospice service.  

## 2019-08-22 NOTE — Assessment & Plan Note (Addendum)
Persists, 08/22/19 MOST comfort measures, no IVF, ABT determine use or limitation of antibiotics when infection occurs. Continue Hospice service.

## 2019-08-22 NOTE — Assessment & Plan Note (Signed)
Heart rate is in control, continue Propranolol.

## 2019-08-22 NOTE — Assessment & Plan Note (Signed)
Pacing, not sleeping/resting well at night, agitation/combative at times, will continue Mirtazapine 15mg  qd, prn Lorazepam, adding Quetiapine 12.5mg  qhs po, observe.

## 2019-08-22 NOTE — Progress Notes (Signed)
Location:   [facility] Room Number: 3 Place of Service:  SNF (31) Provider: [facility] Jeri Jeanbaptiste NP  Mahlon Gammon, MD  Patient Care Team: Mahlon Gammon, MD as PCP - General (Internal Medicine) Nahser, Deloris Ping, MD as PCP - Cardiology (Cardiology) Mckinlee Dunk X, NP as Nurse Practitioner (Internal Medicine)  Extended Emergency Contact Information Primary Emergency Contact: Morgan,Tom Address: [address]          [place], [place] [address] [place] of [place] Home Phone: [phone] Relation: Friend Secondary Emergency Contact: Brandy Hale [place] of [place] Home Phone: [phone] Relation: Niece  Code Status: DNR Goals of care: Advanced Directive information Advanced Directives 07/14/2019  Does Patient Have a Medical Advance Directive? Yes  Type of Advance Directive Out of facility DNR (pink MOST or yellow form);Living will;Healthcare Power of Attorney  Does patient want to make changes to medical advance directive? No - Patient declined  Copy of Healthcare Power of Attorney in Chart? Yes - validated most recent copy scanned in chart (See row information)  Would patient like information on creating a medical advance directive? -  Pre-existing out of facility DNR order (yellow form or pink MOST form) Yellow form placed in chart (order not valid for inpatient use)     Chief Complaint  Patient presents with  . Acute Visit    pacing at night, not sleep well, combative with ADL assistance.     HPI:  Pt is a 86 y.o. female seen today for an acute visit for reported the patient's packing at night, not sleeping/rest well, occasionally she refused or becomes combative when ADL assistance rendered. The patient resides in SNF [facility] for safety, care assistance, ambulates with walker, on Mirtazapine, prn Lorazepam for mood. Chronic lower back pain, stable, on Tylenol 1000mg  bid. HTN/essential tremor in fingers, stable, taking Propranolol 80mg  qd. AFib, heart  rate is in control.    Past Medical History:  Diagnosis Date  . Anticoagulant long-term use   . Atrial fibrillation (HCC)   . CHF (congestive heart failure) (HCC)   . Chronic anticoagulation   . Hypertension   . Raynaud phenomenon   . Tremor, essential    Past Surgical History:  Procedure Laterality Date  . CHOLECYSTECTOMY    . TONSILLECTOMY      Allergies  Allergen Reactions  . Fosamax [Alendronate]   . Other Nausea And Vomiting    Green pepper    Allergies as of 08/22/2019      Reactions   Fosamax [alendronate]    Other Nausea And Vomiting   Green pepper      Medication List       Accurate as of August 22, 2019 11:59 PM. If you have any questions, ask your nurse or doctor.        acetaminophen 500 MG tablet Commonly known as: TYLENOL Take 500 mg by mouth every 8 (eight) hours as needed for mild pain or moderate pain.   acetaminophen 500 MG tablet Commonly known as: TYLENOL Take 1,000 mg by mouth 2 (two) times daily. Not to exceed 3,00 mg daily.   Artificial Tears 0.2-0.2-1 % Soln Generic drug: Glycerin-Hypromellose-PEG 400 Place 1 drop into both eyes 3 (three) times daily.   Caltrate 600+D Plus Minerals 600-800 MG-UNIT Chew Chew 1 tablet by mouth 2 (two) times a day.   LORazepam 0.5 MG tablet Commonly known as: ATIVAN Take 0.5 tablets (0.25 mg total) by mouth every 6 (six) hours as needed for anxiety.  mirtazapine 15 MG tablet Commonly known as: REMERON Take 15 mg by mouth at bedtime.   propranolol ER 80 MG 24 hr capsule Commonly known as: INDERAL LA TAKE 1 CAPSULE BY MOUTH  DAILY   RESOURCE 2.0 PO Take 120 mLs by mouth 3 (three) times daily.   sennosides-docusate sodium 8.6-50 MG tablet Commonly known as: SENOKOT-S Take 2 tablets by mouth at bedtime.      ROS was provided with assistance of staff.  Review of Systems  Constitutional: Positive for fatigue. Negative for activity change, appetite change and fever.  HENT: Negative for  congestion and voice change.   Eyes: Negative for visual disturbance.  Respiratory: Positive for shortness of breath. Negative for cough and wheezing.        DOE  Cardiovascular: Negative for chest pain, palpitations and leg swelling.  Gastrointestinal: Negative for abdominal distention, abdominal pain, constipation, diarrhea, nausea and vomiting.  Genitourinary: Negative for difficulty urinating, dysuria and urgency.  Musculoskeletal: Positive for arthralgias, back pain and gait problem.  Skin: Negative for color change and pallor.  Neurological: Positive for tremors. Negative for dizziness, speech difficulty and headaches.       Dementia.   Psychiatric/Behavioral: Positive for agitation, behavioral problems, confusion, dysphoric mood and sleep disturbance. Negative for hallucinations. The patient is nervous/anxious.     Immunization History  Administered Date(s) Administered  . Influenza Whole 05/09/2018  . Influenza-Unspecified 05/28/2017  . Pneumococcal Conjugate-13 07/13/2017  . Pneumococcal Polysaccharide-23 04/07/2002  . Tdap 07/13/2017, 10/08/2017   Pertinent  Health Maintenance Due  Topic Date Due  . INFLUENZA VACCINE  Completed  . DEXA SCAN  Completed  . PNA vac Low Risk Adult  Completed   Fall Risk  03/10/2019 10/05/2017  Falls in the past year? (No Data) No  Comment Emmi Telephone Survey: data to providers prior to load -  Number falls in past yr: (No Data) -  Comment Emmi Telephone Survey Actual Response =  -   Functional Status Survey:    Vitals:   08/22/19 1524  BP: 104/72  Pulse: 64  Resp: 18  Temp: (!) 96.4 F (35.8 C)  SpO2: 93%   There is no height or weight on file to calculate BMI. Physical Exam Vitals and nursing note reviewed.  Constitutional:      General: She is not in acute distress.    Appearance: Normal appearance. She is not ill-appearing, toxic-appearing or diaphoretic.     Comments: frail  HENT:     Head: Normocephalic and atraumatic.       Nose: Nose normal.     Mouth/Throat:     Mouth: Mucous membranes are dry.  Eyes:     Extraocular Movements: Extraocular movements intact.     Conjunctiva/sclera: Conjunctivae normal.     Pupils: Pupils are equal, round, and reactive to light.  Cardiovascular:     Rate and Rhythm: Normal rate. Rhythm irregular.     Heart sounds: No murmur.  Pulmonary:     Breath sounds: No wheezing, rhonchi or rales.  Abdominal:     General: Bowel sounds are normal. There is no distension.     Palpations: Abdomen is soft.     Tenderness: There is no abdominal tenderness. There is no right CVA tenderness, left CVA tenderness, guarding or rebound.  Musculoskeletal:     Cervical back: Normal range of motion and neck supple.     Right lower leg: No edema.     Left lower leg: No edema.  Comments: Wobbly gait with walker.   Skin:    General: Skin is warm.  Neurological:     General: No focal deficit present.     Mental Status: She is alert. Mental status is at baseline.     Cranial Nerves: No cranial nerve deficit.     Motor: No weakness.     Coordination: Coordination normal.     Gait: Gait abnormal.     Comments: Oriented to self, fine tremors in fingers.   Psychiatric:     Comments: Confused, difficulty redirecting.      Labs reviewed: Recent Labs    02/13/19 0000 02/13/19 0000 03/13/19 0000 05/13/19 0000 05/21/19 0000  NA 147   < > 144 142 144  K 4.3   < > 3.9 3.8 3.7  CL 105  --   --  102 103  CO2 35  --   --  33 33  BUN 43*   < > 28* 28* 26*  CREATININE 1.4*   < > 1.3* 1.1 1.1  CALCIUM 10.9  --   --  9.5 9.9   < > = values in this interval not displayed.   Recent Labs    11/26/18 0000 05/13/19 0000 05/21/19 0000  AST 15 14 13   ALT 6* 6* 5*  ALKPHOS 38 43 41  PROT  --  6.3* 6.1*  ALBUMIN  --  3.8 3.7   Recent Labs    11/26/18 0000 05/13/19 0000 05/21/19 0000  WBC 7.8 8.1 9.1  NEUTROABS  --   --  5,651  HGB 13.0 12.5 12.0  HCT 38 37 36  PLT 160 204 183    Lab Results  Component Value Date   TSH 0.76 05/21/2019   No results found for: HGBA1C No results found for: CHOL, HDL, LDLCALC, LDLDIRECT, TRIG, CHOLHDL  Significant Diagnostic Results in last 30 days:  No results found.  Assessment/Plan: Depression, recurrent (HCC) Pacing, not sleeping/resting well at night, agitation/combative at times, will continue Mirtazapine 15mg  qd, prn Lorazepam, adding Quetiapine 12.5mg  qhs po, observe.   Adult failure to thrive Persists, 08/22/19 MOST comfort measures, no IVF, ABT determine use or limitation of antibiotics when infection occurs. Continue Hospice service.   Advanced care planning/counseling discussion 08/22/19 MOST comfort measures, no IVF, ABT determine use or limitation of antibiotics when infection occurs.   Mixed Alzheimer's and vascular dementia ([place]) Continue SNF [facility] for safety, care assistance, continue Hospice service.   Chronic a-fib (HCC) Heart rate is in control, continue Propranolol.   Hypertension Blood pressure is controlled.   Lower back pain No c/o pain in her back, ambulates with walker, no s/s of pain observed, continue Tylenol. 1000mg  bid.     Family/ staff Communication: plan of care reviewed with the patient and charge nurse.   Labs/tests ordered:  None  Time spend 25 minutes

## 2019-08-22 NOTE — Assessment & Plan Note (Signed)
Blood pressure is controlled

## 2019-08-25 DIAGNOSIS — Z20828 Contact with and (suspected) exposure to other viral communicable diseases: Secondary | ICD-10-CM | POA: Diagnosis not present

## 2019-08-27 DIAGNOSIS — G309 Alzheimer's disease, unspecified: Secondary | ICD-10-CM | POA: Diagnosis not present

## 2019-08-27 DIAGNOSIS — I130 Hypertensive heart and chronic kidney disease with heart failure and stage 1 through stage 4 chronic kidney disease, or unspecified chronic kidney disease: Secondary | ICD-10-CM | POA: Diagnosis not present

## 2019-08-27 DIAGNOSIS — F0280 Dementia in other diseases classified elsewhere without behavioral disturbance: Secondary | ICD-10-CM | POA: Diagnosis not present

## 2019-08-27 DIAGNOSIS — N183 Chronic kidney disease, stage 3 unspecified: Secondary | ICD-10-CM | POA: Diagnosis not present

## 2019-08-27 DIAGNOSIS — R296 Repeated falls: Secondary | ICD-10-CM | POA: Diagnosis not present

## 2019-08-27 DIAGNOSIS — I509 Heart failure, unspecified: Secondary | ICD-10-CM | POA: Diagnosis not present

## 2019-08-28 ENCOUNTER — Non-Acute Institutional Stay (SKILLED_NURSING_FACILITY): Payer: Medicare Other | Admitting: Internal Medicine

## 2019-08-28 ENCOUNTER — Encounter: Payer: Self-pay | Admitting: Internal Medicine

## 2019-08-28 DIAGNOSIS — G309 Alzheimer's disease, unspecified: Secondary | ICD-10-CM | POA: Diagnosis not present

## 2019-08-28 DIAGNOSIS — R627 Adult failure to thrive: Secondary | ICD-10-CM

## 2019-08-28 DIAGNOSIS — F0280 Dementia in other diseases classified elsewhere without behavioral disturbance: Secondary | ICD-10-CM

## 2019-08-28 DIAGNOSIS — I482 Chronic atrial fibrillation, unspecified: Secondary | ICD-10-CM | POA: Diagnosis not present

## 2019-08-28 DIAGNOSIS — F0150 Vascular dementia without behavioral disturbance: Secondary | ICD-10-CM

## 2019-08-28 DIAGNOSIS — F339 Major depressive disorder, recurrent, unspecified: Secondary | ICD-10-CM

## 2019-08-28 DIAGNOSIS — I10 Essential (primary) hypertension: Secondary | ICD-10-CM | POA: Diagnosis not present

## 2019-08-28 NOTE — Progress Notes (Signed)
Location:   [facility] Room Number: 31 Place of Service:  SNF [phone]) Provider:  [name], MD  [name], MD  Patient Care Team: [name], MD as PCP - General (Internal Medicine) Nahser, Wonda Cheng, MD as PCP - Cardiology (Cardiology) Mast, Man X, NP as Nurse Practitioner (Internal Medicine)  Extended Emergency Contact Information Primary Emergency Contact: Morgan,Tom Address: [address]          [place], [place] [address] Johnnette Litter of [place] Phone: [phone] Relation: Friend Secondary Emergency Contact: [place] of [place] Phone: [phone] Relation: Niece  Code Status:  DNR Goals of care: Advanced Directive information Advanced Directives 08/28/2019  Does Patient Have a Medical Advance Directive? Yes  Type of Advance Directive Out of facility DNR (pink MOST or yellow form);Healthcare Power of Attorney  Does patient want to make changes to medical advance directive? No - Patient declined  Copy of [place] in Chart? Yes - validated most recent copy scanned in chart (See row information)  Would patient like information on creating a medical advance directive? -  Pre-existing out of facility DNR order (yellow form or pink MOST form) Yellow form placed in chart (order not valid for inpatient use)     Chief Complaint  Patient presents with  . Medical Management of Chronic Issues    HPI:  Pt is a 86 y.o. female seen today for medical management of chronic diseases.    Patient has h/o Atrial Fibrillation , LE edema, Hypertension, Dysphagia,Essential Tremor, Anemia, Dementia,and depression, Falls Patient is a long-term resident  Patient is now under hospice care for failure to thrive and behavior issues Patient also has a history of recurent  falls was taken off the Eliquis She had end-stage dementia.  She does walk with a walker.  She was recently started on Ativan  for her behavior issues and restlessness. She is unable to give me any history at these let me examine her. No new nursing issues except weight loss.  Her appetite is poor Has lost 10 more pounds in past 3 months   Past Medical History:  Diagnosis Date  . Anticoagulant long-term use   . Atrial fibrillation ([place])   . CHF (congestive heart failure) ([place])   . Chronic anticoagulation   . Hypertension   . Raynaud phenomenon   . Tremor, essential    Past Surgical History:  Procedure Laterality Date  . CHOLECYSTECTOMY    . TONSILLECTOMY      Allergies  Allergen Reactions  . Fosamax [Alendronate]   . Other Nausea And Vomiting    Green pepper    Allergies as of 08/28/2019      Reactions   Fosamax [alendronate]    Other Nausea And Vomiting   Green pepper      Medication List       Accurate as of August 28, 2019 10:22 AM. If you have any questions, ask your nurse or doctor.        STOP taking these medications   LORazepam 0.5 MG tablet Commonly known as: ATIVAN Stopped by: [name], MD     TAKE these medications   acetaminophen 500 MG tablet Commonly known as: TYLENOL Take 500 mg by mouth every 8 (eight) hours as needed for mild pain or moderate pain.   acetaminophen 500 MG tablet Commonly known as: TYLENOL Take 1,000 mg by mouth 2 (two) times daily. Not to exceed  3,00 mg daily.   Artificial Tears 0.2-0.2-1 % Soln Generic drug: Glycerin-Hypromellose-PEG 400 Place 1 drop into both eyes 3 (three) times daily.   Caltrate 600+D Plus Minerals 600-800 MG-UNIT Chew Chew 1 tablet by mouth 2 (two) times a day.   mirtazapine 15 MG tablet Commonly known as: REMERON Take 15 mg by mouth at bedtime.   propranolol ER 80 MG 24 hr capsule Commonly known as: INDERAL LA TAKE 1 CAPSULE BY MOUTH  DAILY   QUEtiapine 12.5 mg Tabs tablet Commonly known as: SEROQUEL Take 12.5 mg by mouth at bedtime.   RESOURCE 2.0 PO Take 120 mLs by mouth 3 (three) times daily.     sennosides-docusate sodium 8.6-50 MG tablet Commonly known as: SENOKOT-S Take 2 tablets by mouth at bedtime.       Review of Systems  Unable to perform ROS: Dementia    Immunization History  Administered Date(s) Administered  . Influenza Whole 05/09/2018  . Influenza-Unspecified 05/28/2017  . Pneumococcal Conjugate-13 07/13/2017  . Pneumococcal Polysaccharide-23 04/07/2002  . Tdap 07/13/2017, 10/08/2017   Pertinent  Health Maintenance Due  Topic Date Due  . INFLUENZA VACCINE  Completed  . DEXA SCAN  Completed  . PNA vac Low Risk Adult  Completed   Fall Risk  03/10/2019 10/05/2017  Falls in the past year? (No Data) No  Comment Emmi Telephone Survey: data to providers prior to load -  Number falls in past yr: (No Data) -  Comment Emmi Telephone Survey Actual Response =  -   Functional Status Survey:    Vitals:   08/28/19 1017  BP: 110/60  Pulse: 76  Resp: 18  Temp: (!) 97.1 F (36.2 C)  SpO2: 92%  Weight: 99 lb 14.4 oz (45.3 kg)  Height: 5\' 4"  (1.626 m)   Body mass index is 17.15 kg/m. Physical Exam Vitals reviewed.  Constitutional:      Comments: Very frail  HENT:     Head: Normocephalic.     Nose: Nose normal.     Mouth/Throat:     Mouth: Mucous membranes are dry.     Pharynx: Oropharynx is clear.  Eyes:     Pupils: Pupils are equal, round, and reactive to light.  Cardiovascular:     Rate and Rhythm: Normal rate. Rhythm irregular.     Pulses: Normal pulses.  Pulmonary:     Effort: Pulmonary effort is normal. No respiratory distress.     Breath sounds: No wheezing or rales.  Abdominal:     General: Abdomen is flat. Bowel sounds are normal.     Palpations: Abdomen is soft.  Musculoskeletal:        General: No swelling.     Cervical back: Neck supple.  Skin:    General: Skin is warm.  Neurological:     General: No focal deficit present.     Mental Status: She is alert.  Psychiatric:        Mood and Affect: Mood normal.        Thought Content:  Thought content normal.        Judgment: Judgment normal.     Labs reviewed: Recent Labs    02/13/19 0000 02/13/19 0000 03/13/19 0000 05/13/19 0000 05/21/19 0000  NA 147   < > 144 142 144  K 4.3   < > 3.9 3.8 3.7  CL 105  --   --  102 103  CO2 35  --   --  33 33  BUN 43*   < >  28* 28* 26*  CREATININE 1.4*   < > 1.3* 1.1 1.1  CALCIUM 10.9  --   --  9.5 9.9   < > = values in this interval not displayed.   Recent Labs    11/26/18 0000 05/13/19 0000 05/21/19 0000  AST 15 14 13   ALT 6* 6* 5*  ALKPHOS 38 43 41  PROT  --  6.3* 6.1*  ALBUMIN  --  3.8 3.7   Recent Labs    11/26/18 0000 05/13/19 0000 05/21/19 0000  WBC 7.8 8.1 9.1  NEUTROABS  --   --  5,651  HGB 13.0 12.5 12.0  HCT 38 37 36  PLT 160 204 183   Lab Results  Component Value Date   TSH 0.76 05/21/2019   No results found for: HGBA1C No results found for: CHOL, HDL, LDLCALC, LDLDIRECT, TRIG, CHOLHDL  Significant Diagnostic Results in last 30 days:  No results found.  Assessment/Plan . Essential hypertension  Blood pressure stable On Inderal  Chronic a-fib When taken off Eliquis due to falls  Oropharyngeal dysphagia Refuses to drink thickened liquids On Remeron but continues to lose weight Tremor, essential Continues on Inderal CKD (chronic kidney disease) stage3 Her creatinine has been stable  Depression with anxiety Stable on Remeron Dementiawith Failure to thrive Continues to lose weight most likely due to her dementia  Family/ staff Communication:   Labs/tests ordered:    Total time spent in this patient care encounter was  25_  minutes; greater than 50% of the visit spent counseling patient and staff, reviewing records , Labs and coordinating care for problems addressed at this encounter.

## 2019-09-01 DIAGNOSIS — Z20828 Contact with and (suspected) exposure to other viral communicable diseases: Secondary | ICD-10-CM | POA: Diagnosis not present

## 2019-09-02 ENCOUNTER — Non-Acute Institutional Stay (SKILLED_NURSING_FACILITY): Payer: Medicare Other | Admitting: Internal Medicine

## 2019-09-02 ENCOUNTER — Encounter: Payer: Self-pay | Admitting: Internal Medicine

## 2019-09-02 DIAGNOSIS — H1033 Unspecified acute conjunctivitis, bilateral: Secondary | ICD-10-CM

## 2019-09-02 DIAGNOSIS — R5383 Other fatigue: Secondary | ICD-10-CM | POA: Diagnosis not present

## 2019-09-02 DIAGNOSIS — R627 Adult failure to thrive: Secondary | ICD-10-CM

## 2019-09-02 DIAGNOSIS — I482 Chronic atrial fibrillation, unspecified: Secondary | ICD-10-CM

## 2019-09-02 NOTE — Progress Notes (Signed)
Location:  [facility] Room Number: 31A Place of Service:  SNF (31) Provider: Geralynn Rile L. Chales Abrahams, MD   Mahlon Gammon, MD  Patient Care Team: Mahlon Gammon, MD as PCP - General (Internal Medicine) Nahser, Deloris Ping, MD as PCP - Cardiology (Cardiology) Mast, Man X, NP as Nurse Practitioner (Internal Medicine)  Extended Emergency Contact Information Primary Emergency Contact: Morgan,Tom Address: [address]          [place], [place] [address] Darden Amber of [place] Home Phone: [phone] Relation: Friend Secondary Emergency Contact: Brandy Hale [place] of [place] Home Phone: [phone] Relation: Niece  Code Status:  DNR Goals of care: Advanced Directive information Advanced Directives 09/02/2019  Does Patient Have a Medical Advance Directive? Yes  Type of Estate agent of [place];Out of facility DNR (pink MOST or yellow form)  Does patient want to make changes to medical advance directive? No - Patient declined  Copy of Healthcare Power of Attorney in Chart? Yes - validated most recent copy scanned in chart (See row information)  Would patient like information on creating a medical advance directive? -  Pre-existing out of facility DNR order (yellow form or pink MOST form) Yellow form placed in chart (order not valid for inpatient use)     Chief Complaint  Patient presents with  . Acute Visit    Lethargy and Left Eye Discharge and Red    HPI:  Pt is a 86 y.o. female seen today for an acute visit for Lethargy per Nurses  Patient has h/o Atrial Fibrillation , LE edema, Hypertension, Dysphagia,Essential Tremor, Anemia, Dementia,and depression, Falls Patient is a long-term resident She is under Hospice care for failure to thrive And has end Stage Dementia She was noticed by nurses to be sleeping more and staying in her bed which is not normal for her.  She also was noticed to have discharge form her eyes.  Left more then right with some redness. Patient was in the bed when I went to see her.  She responded appropriately.  Seems to be moving all her extremities.  She did have discharge and redness in her eyes and she was itching.  Unable to give detailed history due to her dementia  Past Medical History:  Diagnosis Date  . Anticoagulant long-term use   . Atrial fibrillation (H[facility])   . CHF (congestive heart failure) (H[facility])   . Chronic anticoagulation   . Hypertension   . Raynaud phenomenon   . Tremor, essential    Past Surgical History:  Procedure Laterality Date  . CHOLECYSTECTOMY    . TONSILLECTOMY      Allergies  Allergen Reactions  . Fosamax [Alendronate]   . Other Nausea And Vomiting    Green pepper    Outpatient Encounter Medications as of 09/02/2019  Medication Sig  . acetaminophen (TYLENOL) 500 MG tablet Take 500 mg by mouth every 8 (eight) hours as needed for mild pain or moderate pain.   Marland Kitchen acetaminophen (TYLENOL) 500 MG tablet Take 1,000 mg by mouth 2 (two) times daily. Not to exceed 3,00 mg daily.  . Calcium Carbonate-Vit D-Min (CALTRATE 600+D PLUS MINERALS) 600-800 MG-UNIT CHEW Chew 1 tablet by mouth 2 (two) times a day.  . Glycerin-Hypromellose-PEG 400 (ARTIFICIAL TEARS) 0.2-0.2-1 % SOLN Place 1 drop into both eyes 3 (three) times daily.   . mirtazapine (REMERON) 15 MG tablet Take 15 mg by mouth at bedtime.   . NON FORMULARY Incontinent Care: apply Vaseline to  buttocks after each incontinent episode Every Shift  . NON FORMULARY Moisturizing Cream to extremities with morning and evening care Every Shift  . Nutritional Supplements (RESOURCE 2.0 PO) Take 120 mLs by mouth 3 (three) times daily.   . propranolol ER (INDERAL LA) 80 MG 24 hr capsule TAKE 1 CAPSULE BY MOUTH  DAILY  . QUEtiapine (SEROQUEL) 12.5 mg TABS tablet Take 12.5 mg by mouth at bedtime.  . sennosides-docusate sodium (SENOKOT-S) 8.6-50 MG tablet Take 2 tablets by mouth at bedtime.    No  facility-administered encounter medications on file as of 09/02/2019.    Review of Systems  Unable to perform ROS: Dementia    Immunization History  Administered Date(s) Administered  . Influenza Whole 05/09/2018  . Influenza-Unspecified 05/28/2017  . Pneumococcal Conjugate-13 07/13/2017  . Pneumococcal Polysaccharide-23 04/07/2002  . Tdap 07/13/2017, 10/08/2017   Pertinent  Health Maintenance Due  Topic Date Due  . INFLUENZA VACCINE  Completed  . DEXA SCAN  Completed  . PNA vac Low Risk Adult  Completed   Fall Risk  03/10/2019 10/05/2017  Falls in the past year? (No Data) No  Comment Emmi Telephone Survey: data to providers prior to load -  Number falls in past yr: (No Data) -  Comment Emmi Telephone Survey Actual Response =  -   Functional Status Survey:    Vitals:   09/02/19 1250  BP: 116/60  Pulse: 70  Resp: 18  Temp: (!) 97.3 F (36.3 C)  TempSrc: Oral  SpO2: 91%  Weight: 99 lb 14.4 oz (45.3 kg)  Height: 5\' 4"  (1.626 m)   Body mass index is 17.15 kg/m. Physical Exam Vitals reviewed.  Constitutional:      Appearance: Normal appearance.  HENT:     Head: Normocephalic.     Nose: Nose normal.     Mouth/Throat:     Mouth: Mucous membranes are moist.     Pharynx: Oropharynx is clear.  Eyes:     Pupils: Pupils are equal, round, and reactive to light.     Comments: Redness and discharge from both Eyes  Cardiovascular:     Rate and Rhythm: Normal rate. Rhythm irregular.     Pulses: Normal pulses.  Pulmonary:     Effort: Pulmonary effort is normal.     Breath sounds: Normal breath sounds.  Abdominal:     General: Abdomen is flat. Bowel sounds are normal.     Palpations: Abdomen is soft.  Musculoskeletal:        General: No swelling.     Cervical back: Neck supple.  Skin:    General: Skin is warm.  Neurological:     General: No focal deficit present.     Mental Status: She is alert.  Psychiatric:        Mood and Affect: Mood normal.        Thought  Content: Thought content normal.     Labs reviewed: Recent Labs    02/13/19 0000 02/13/19 0000 03/13/19 0000 05/13/19 0000 05/21/19 0000  NA 147   < > 144 142 144  K 4.3   < > 3.9 3.8 3.7  CL 105  --   --  102 103  CO2 35  --   --  33 33  BUN 43*   < > 28* 28* 26*  CREATININE 1.4*   < > 1.3* 1.1 1.1  CALCIUM 10.9  --   --  9.5 9.9   < > = values in this interval not  displayed.   Recent Labs    11/26/18 0000 05/13/19 0000 05/21/19 0000  AST 15 14 13   ALT 6* 6* 5*  ALKPHOS 38 43 41  PROT  --  6.3* 6.1*  ALBUMIN  --  3.8 3.7   Recent Labs    11/26/18 0000 05/13/19 0000 05/21/19 0000  WBC 7.8 8.1 9.1  NEUTROABS  --   --  5,651  HGB 13.0 12.5 12.0  HCT 38 37 36  PLT 160 204 183   Lab Results  Component Value Date   TSH 0.76 05/21/2019   No results found for: HGBA1C No results found for: CHOL, HDL, LDLCALC, LDLDIRECT, TRIG, CHOLHDL  Significant Diagnostic Results in last 30 days:  No results found.  Assessment/Plan  Lethargy Seems to be at her baseline right now We will continue to monitor her vitals She has been tested for Covid and has been negative Patient is involved in hospice care Stays at very high risk for CVA due to her history of A. Fib Would not order any labs Will make her Seroquel PRN for now and see if it helps her Lethargy D/W Nurse   Acute bacterial conjunctivitis of both eyes We will start her on Ocuflox  Adult failure to thrive Continue to provide supportive care  Chronic a-fib (HCC) Off Eliquis due to falls Oropharyngeal dysphagia Refuses to drink thickened liquids On Remeron but continues to lose weight Tremor, essential Continues on Inderal CKD (chronic kidney disease) stage3 Her creatinine has been stable   Family/ staff Communication:   Labs/tests ordered:

## 2019-09-04 DIAGNOSIS — I509 Heart failure, unspecified: Secondary | ICD-10-CM | POA: Diagnosis not present

## 2019-09-04 DIAGNOSIS — F0280 Dementia in other diseases classified elsewhere without behavioral disturbance: Secondary | ICD-10-CM | POA: Diagnosis not present

## 2019-09-04 DIAGNOSIS — I130 Hypertensive heart and chronic kidney disease with heart failure and stage 1 through stage 4 chronic kidney disease, or unspecified chronic kidney disease: Secondary | ICD-10-CM | POA: Diagnosis not present

## 2019-09-04 DIAGNOSIS — G309 Alzheimer's disease, unspecified: Secondary | ICD-10-CM | POA: Diagnosis not present

## 2019-09-04 DIAGNOSIS — N183 Chronic kidney disease, stage 3 unspecified: Secondary | ICD-10-CM | POA: Diagnosis not present

## 2019-09-04 DIAGNOSIS — R296 Repeated falls: Secondary | ICD-10-CM | POA: Diagnosis not present

## 2019-09-06 DIAGNOSIS — Z23 Encounter for immunization: Secondary | ICD-10-CM | POA: Diagnosis not present

## 2019-09-08 ENCOUNTER — Encounter: Payer: Self-pay | Admitting: Nurse Practitioner

## 2019-09-08 DIAGNOSIS — N183 Chronic kidney disease, stage 3 unspecified: Secondary | ICD-10-CM | POA: Diagnosis not present

## 2019-09-08 DIAGNOSIS — I509 Heart failure, unspecified: Secondary | ICD-10-CM | POA: Diagnosis not present

## 2019-09-08 DIAGNOSIS — Z947 Corneal transplant status: Secondary | ICD-10-CM | POA: Diagnosis not present

## 2019-09-08 DIAGNOSIS — D631 Anemia in chronic kidney disease: Secondary | ICD-10-CM | POA: Diagnosis not present

## 2019-09-08 DIAGNOSIS — G250 Essential tremor: Secondary | ICD-10-CM | POA: Diagnosis not present

## 2019-09-08 DIAGNOSIS — F0280 Dementia in other diseases classified elsewhere without behavioral disturbance: Secondary | ICD-10-CM | POA: Diagnosis not present

## 2019-09-08 DIAGNOSIS — R296 Repeated falls: Secondary | ICD-10-CM | POA: Diagnosis not present

## 2019-09-08 DIAGNOSIS — Z87440 Personal history of urinary (tract) infections: Secondary | ICD-10-CM | POA: Diagnosis not present

## 2019-09-08 DIAGNOSIS — Z20828 Contact with and (suspected) exposure to other viral communicable diseases: Secondary | ICD-10-CM | POA: Diagnosis not present

## 2019-09-08 DIAGNOSIS — R159 Full incontinence of feces: Secondary | ICD-10-CM | POA: Diagnosis not present

## 2019-09-08 DIAGNOSIS — H01003 Unspecified blepharitis right eye, unspecified eyelid: Secondary | ICD-10-CM | POA: Diagnosis not present

## 2019-09-08 DIAGNOSIS — S22080D Wedge compression fracture of T11-T12 vertebra, subsequent encounter for fracture with routine healing: Secondary | ICD-10-CM | POA: Diagnosis not present

## 2019-09-08 DIAGNOSIS — Z681 Body mass index (BMI) 19 or less, adult: Secondary | ICD-10-CM | POA: Diagnosis not present

## 2019-09-08 DIAGNOSIS — E46 Unspecified protein-calorie malnutrition: Secondary | ICD-10-CM | POA: Diagnosis not present

## 2019-09-08 DIAGNOSIS — Z741 Need for assistance with personal care: Secondary | ICD-10-CM | POA: Diagnosis not present

## 2019-09-08 DIAGNOSIS — I4891 Unspecified atrial fibrillation: Secondary | ICD-10-CM | POA: Diagnosis not present

## 2019-09-08 DIAGNOSIS — G309 Alzheimer's disease, unspecified: Secondary | ICD-10-CM | POA: Diagnosis not present

## 2019-09-08 DIAGNOSIS — I130 Hypertensive heart and chronic kidney disease with heart failure and stage 1 through stage 4 chronic kidney disease, or unspecified chronic kidney disease: Secondary | ICD-10-CM | POA: Diagnosis not present

## 2019-09-08 DIAGNOSIS — R32 Unspecified urinary incontinence: Secondary | ICD-10-CM | POA: Diagnosis not present

## 2019-09-08 DIAGNOSIS — I7300 Raynaud's syndrome without gangrene: Secondary | ICD-10-CM | POA: Diagnosis not present

## 2019-09-08 DIAGNOSIS — F418 Other specified anxiety disorders: Secondary | ICD-10-CM | POA: Diagnosis not present

## 2019-09-10 ENCOUNTER — Non-Acute Institutional Stay (SKILLED_NURSING_FACILITY): Payer: Medicare Other | Admitting: Nurse Practitioner

## 2019-09-10 ENCOUNTER — Encounter: Payer: Self-pay | Admitting: Nurse Practitioner

## 2019-09-10 DIAGNOSIS — F0150 Vascular dementia without behavioral disturbance: Secondary | ICD-10-CM

## 2019-09-10 DIAGNOSIS — F339 Major depressive disorder, recurrent, unspecified: Secondary | ICD-10-CM | POA: Diagnosis not present

## 2019-09-10 DIAGNOSIS — I10 Essential (primary) hypertension: Secondary | ICD-10-CM | POA: Diagnosis not present

## 2019-09-10 DIAGNOSIS — R269 Unspecified abnormalities of gait and mobility: Secondary | ICD-10-CM

## 2019-09-10 DIAGNOSIS — M545 Low back pain, unspecified: Secondary | ICD-10-CM

## 2019-09-10 DIAGNOSIS — F0280 Dementia in other diseases classified elsewhere without behavioral disturbance: Secondary | ICD-10-CM | POA: Diagnosis not present

## 2019-09-10 DIAGNOSIS — G250 Essential tremor: Secondary | ICD-10-CM

## 2019-09-10 DIAGNOSIS — R296 Repeated falls: Secondary | ICD-10-CM | POA: Diagnosis not present

## 2019-09-10 DIAGNOSIS — G309 Alzheimer's disease, unspecified: Secondary | ICD-10-CM

## 2019-09-10 DIAGNOSIS — I482 Chronic atrial fibrillation, unspecified: Secondary | ICD-10-CM | POA: Diagnosis not present

## 2019-09-10 NOTE — Assessment & Plan Note (Signed)
Stable, continue Tylenol.  

## 2019-09-10 NOTE — Assessment & Plan Note (Signed)
Continue SNF [facility] for safety, care assistance, continue Hospice service.  

## 2019-09-10 NOTE — Assessment & Plan Note (Signed)
Increased unsteadiness in gait/frailty, close supervision/assistance when ambulates with walker/transfer.

## 2019-09-10 NOTE — Assessment & Plan Note (Signed)
Fall, lack of safety awareness, increased frailty/gate issue are contributory, close supervision for safety, her goal of care is comfort measure.

## 2019-09-10 NOTE — Progress Notes (Addendum)
Location:  [facility] Room Number: 31A Place of Service:  SNF (31) Provider:  Teigan Sahli Johnney Ou, NP   Mahlon Gammon, MD  Patient Care Team: Mahlon Gammon, MD as PCP - General (Internal Medicine) Nahser, Deloris Ping, MD as PCP - Cardiology (Cardiology) Tasheema Perrone X, NP as Nurse Practitioner (Internal Medicine)  Extended Emergency Contact Information Primary Emergency Contact: Morgan,Tom Address: [address]          [place], [place] [address] Darden Amber of [place] Home Phone: [phone] Relation: Friend Secondary Emergency Contact: Brandy Hale [place] Home Phone: [phone] Relation: Niece  Code Status:  DNR Goals of care: Advanced Directive information Advanced Directives 09/10/2019  Does Patient Have a Medical Advance Directive? Yes  Type of Advance Directive Out of facility DNR (pink MOST or yellow form);Healthcare Power of Attorney  Does patient want to make changes to medical advance directive? No - Patient declined  Copy of Healthcare Power of Attorney in Chart? Yes - validated most recent copy scanned in chart (See row information)  Would patient like information on creating a medical advance directive? -  Pre-existing out of facility DNR order (yellow form or pink MOST form) Yellow form placed in chart (order not valid for inpatient use)     Chief Complaint  Patient presents with  . Acute Visit    Fall     HPI:  Pt is a 86 y.o. female seen today for an acute visit for fall reported 09/09/19 when the patient was found sitting on floor, no apparent injury noted. The patient resides in SNF [facility] for safety, care assistance, ambulates with walker, her mood is stable on Lorazepam 0.5mg  prn, Mirtazapine 15mg  qd, Quetiapine 12.5mg  qhs. Chronic lower back pain is controlled, on Tylenol 1000mg  bid. Essential tremor, in fingers, not new, on Propranolol 80mg  qd. Afib, heart rate is in control, off anticoagulants. HTN, blood pressure  is controlled.    Past Medical History:  Diagnosis Date  . Anticoagulant long-term use   . Atrial fibrillation (HCC)   . CHF (congestive heart failure) (HCC)   . Chronic anticoagulation   . Hypertension   . Raynaud phenomenon   . Tremor, essential    Past Surgical History:  Procedure Laterality Date  . CHOLECYSTECTOMY    . TONSILLECTOMY      Allergies  Allergen Reactions  . Fosamax [Alendronate]   . Other Nausea And Vomiting    Green pepper    Outpatient Encounter Medications as of 09/10/2019  Medication Sig  . acetaminophen (TYLENOL) 500 MG tablet Take 500 mg by mouth every 8 (eight) hours as needed for mild pain or moderate pain.   acetaminophen (TYLENOL) 500 MG tablet Take 1,000 mg by mouth 2 (two) times daily. Not to exceed 3,00 mg daily.  . Calcium Carbonate-Vit D-Min (CALTRATE 600+D PLUS MINERALS) 600-800 MG-UNIT CHEW Chew 1 tablet by mouth 2 (two) times a day.  . Glycerin-Hypromellose-PEG 400 (ARTIFICIAL TEARS) 0.2-0.2-1 % SOLN Place 1 drop into both eyes 3 (three) times daily.   LORazepam (ATIVAN) 0.5 MG tablet Take 0.5 mg by mouth every 6 (six) hours as needed for anxiety.  . mirtazapine (REMERON) 15 MG tablet Take 15 mg by mouth at bedtime.   . NON FORMULARY Incontinent Care: apply Vaseline to buttocks after each incontinent episode Every Shift  . NON FORMULARY Moisturizing Cream to extremities with morning and evening care Every Shift  . NON FORMULARY Regular, Nectar Thick, Mech  Soft Special Instructions: Regular diet with Nectar thick liquids by straw. 2 handled cup and divided plate with built-up utensils for meals.  . Nutritional Supplements (RESOURCE 2.0 PO) Take 120 mLs by mouth 2 (two) times daily. MORNING 07:00 AM - 10:00 AM, EVENING 07:00 PM - 10:00 PM  . propranolol ER (INDERAL LA) 80 MG 24 hr capsule TAKE 1 CAPSULE BY MOUTH  DAILY  . QUEtiapine (SEROQUEL) 12.5 mg TABS tablet Take 12.5 mg by mouth at bedtime.  . sennosides-docusate sodium (SENOKOT-S)  8.6-50 MG tablet Take 2 tablets by mouth at bedtime.    No facility-administered encounter medications on file as of 09/10/2019.   ROS was provided with assistance of staff.  Review of Systems  Constitutional: Positive for fatigue. Negative for activity change, appetite change, chills, diaphoresis and fever.  HENT: Positive for hearing loss. Negative for congestion and voice change.   Eyes: Negative for visual disturbance.  Respiratory: Positive for shortness of breath. Negative for cough and wheezing.        DOE  Gastrointestinal: Negative for abdominal distention, abdominal pain, constipation, diarrhea, nausea and vomiting.  Genitourinary: Negative for dysuria and urgency.  Musculoskeletal: Positive for arthralgias, back pain and gait problem.  Skin: Negative for color change and pallor.  Neurological: Positive for tremors. Negative for dizziness, facial asymmetry, speech difficulty, weakness and headaches.       Dementia  Psychiatric/Behavioral: Positive for confusion. Negative for agitation, behavioral problems, hallucinations and sleep disturbance. The patient is nervous/anxious.     Immunization History  Administered Date(s) Administered  . Influenza Whole 05/09/2018  . Influenza-Unspecified 05/28/2017  . Pneumococcal Conjugate-13 07/13/2017  . Pneumococcal Polysaccharide-23 04/07/2002  . Tdap 07/13/2017, 10/08/2017   Pertinent  Health Maintenance Due  Topic Date Due  . INFLUENZA VACCINE  Completed  . DEXA SCAN  Completed  . PNA vac Low Risk Adult  Completed   Fall Risk  03/10/2019 10/05/2017  Falls in the past year? (No Data) No  Comment Emmi Telephone Survey: data to providers prior to load -  Number falls in past yr: (No Data) -  Comment Emmi Telephone Survey Actual Response =  -   Functional Status Survey:    Vitals:   09/10/19 1451  BP: 120/70  Pulse: 68  Resp: 20  Temp: (!) 97.2 F (36.2 C)  TempSrc: Oral  SpO2: 92%  Weight: 95 lb 8 oz (43.3 kg)  Height: 5'  4" (1.626 m)   Body mass index is 16.39 kg/m. Physical Exam Vitals and nursing note reviewed.  Constitutional:      General: She is not in acute distress.    Appearance: Normal appearance. She is not ill-appearing, toxic-appearing or diaphoretic.  HENT:     Head: Normocephalic and atraumatic.     Nose: Nose normal.     Mouth/Throat:     Mouth: Mucous membranes are moist.  Eyes:     Extraocular Movements: Extraocular movements intact.     Conjunctiva/sclera: Conjunctivae normal.     Pupils: Pupils are equal, round, and reactive to light.  Cardiovascular:     Rate and Rhythm: Normal rate. Rhythm irregular.     Heart sounds: No murmur.  Pulmonary:     Breath sounds: No wheezing, rhonchi or rales.  Abdominal:     General: Bowel sounds are normal. There is no distension.     Palpations: Abdomen is soft.     Tenderness: There is no abdominal tenderness. There is no right CVA tenderness, left CVA tenderness, guarding  or rebound.  Musculoskeletal:     Cervical back: Normal range of motion and neck supple.     Right lower leg: No edema.     Left lower leg: No edema.  Skin:    General: Skin is warm and dry.  Neurological:     General: No focal deficit present.     Mental Status: She is alert. Mental status is at baseline.     Motor: No weakness.     Gait: Gait abnormal.     Comments: Tremor in fingers. Oriented to self.   Psychiatric:     Comments: Confused.      Labs reviewed: Recent Labs    02/13/19 0000 02/13/19 0000 03/13/19 0000 05/13/19 0000 05/21/19 0000  NA 147   < > 144 142 144  K 4.3   < > 3.9 3.8 3.7  CL 105  --   --  102 103  CO2 35  --   --  33 33  BUN 43*   < > 28* 28* 26*  CREATININE 1.4*   < > 1.3* 1.1 1.1  CALCIUM 10.9  --   --  9.5 9.9   < > = values in this interval not displayed.   Recent Labs    11/26/18 0000 05/13/19 0000 05/21/19 0000  AST 15 14 13   ALT 6* 6* 5*  ALKPHOS 38 43 41  PROT  --  6.3* 6.1*  ALBUMIN  --  3.8 3.7   Recent  Labs    11/26/18 0000 05/13/19 0000 05/21/19 0000  WBC 7.8 8.1 9.1  NEUTROABS  --   --  5,651  HGB 13.0 12.5 12.0  HCT 38 37 36  PLT 160 204 183   Lab Results  Component Value Date   TSH 0.76 05/21/2019     Significant Diagnostic Results in last 30 days:    Assessment/Plan  Frequent falls Fall, lack of safety awareness, increased frailty/gate issue are contributory, close supervision for safety, her goal of care is comfort measure.   Depression, recurrent ([place]) Her mood is stable, continue Lorazepam, Mirtazapine, Quetiapine.   Gait abnormality Increased unsteadiness in gait/frailty, close supervision/assistance when ambulates with walker/transfer.   Mixed Alzheimer's and vascular dementia ([place]) Continue SNF [facility] for safety, care assistance, continue Hospice service.   Chronic a-fib (HCC) Heart rate is in control, off anticoagulant.   Hypertension Blood pressure is controlled.   Tremor, essential Tremors in fingers, continue Propranolol.   Lower back pain Stable, continue Tylenol.     Family/ staff Communication: plan of care reviewed with the patient and charge nurse.   Labs/tests ordered: none  Time spend 25 minutes.

## 2019-09-10 NOTE — Assessment & Plan Note (Signed)
Her mood is stable, continue Lorazepam, Mirtazapine, Quetiapine.

## 2019-09-10 NOTE — Assessment & Plan Note (Signed)
Blood pressure is controlled

## 2019-09-10 NOTE — Assessment & Plan Note (Signed)
Tremors in fingers, continue Propranolol.

## 2019-09-10 NOTE — Assessment & Plan Note (Signed)
Heart rate is in control, off anticoagulant.

## 2019-09-11 DIAGNOSIS — I509 Heart failure, unspecified: Secondary | ICD-10-CM | POA: Diagnosis not present

## 2019-09-11 DIAGNOSIS — G309 Alzheimer's disease, unspecified: Secondary | ICD-10-CM | POA: Diagnosis not present

## 2019-09-11 DIAGNOSIS — N183 Chronic kidney disease, stage 3 unspecified: Secondary | ICD-10-CM | POA: Diagnosis not present

## 2019-09-11 DIAGNOSIS — I130 Hypertensive heart and chronic kidney disease with heart failure and stage 1 through stage 4 chronic kidney disease, or unspecified chronic kidney disease: Secondary | ICD-10-CM | POA: Diagnosis not present

## 2019-09-11 DIAGNOSIS — R296 Repeated falls: Secondary | ICD-10-CM | POA: Diagnosis not present

## 2019-09-11 DIAGNOSIS — F0280 Dementia in other diseases classified elsewhere without behavioral disturbance: Secondary | ICD-10-CM | POA: Diagnosis not present

## 2019-09-15 DIAGNOSIS — Z20828 Contact with and (suspected) exposure to other viral communicable diseases: Secondary | ICD-10-CM | POA: Diagnosis not present

## 2019-09-17 DIAGNOSIS — N183 Chronic kidney disease, stage 3 unspecified: Secondary | ICD-10-CM | POA: Diagnosis not present

## 2019-09-17 DIAGNOSIS — G309 Alzheimer's disease, unspecified: Secondary | ICD-10-CM | POA: Diagnosis not present

## 2019-09-17 DIAGNOSIS — R296 Repeated falls: Secondary | ICD-10-CM | POA: Diagnosis not present

## 2019-09-17 DIAGNOSIS — I130 Hypertensive heart and chronic kidney disease with heart failure and stage 1 through stage 4 chronic kidney disease, or unspecified chronic kidney disease: Secondary | ICD-10-CM | POA: Diagnosis not present

## 2019-09-17 DIAGNOSIS — I509 Heart failure, unspecified: Secondary | ICD-10-CM | POA: Diagnosis not present

## 2019-09-17 DIAGNOSIS — F0280 Dementia in other diseases classified elsewhere without behavioral disturbance: Secondary | ICD-10-CM | POA: Diagnosis not present

## 2019-09-22 DIAGNOSIS — Z20828 Contact with and (suspected) exposure to other viral communicable diseases: Secondary | ICD-10-CM | POA: Diagnosis not present

## 2019-09-24 DIAGNOSIS — I130 Hypertensive heart and chronic kidney disease with heart failure and stage 1 through stage 4 chronic kidney disease, or unspecified chronic kidney disease: Secondary | ICD-10-CM | POA: Diagnosis not present

## 2019-09-24 DIAGNOSIS — I509 Heart failure, unspecified: Secondary | ICD-10-CM | POA: Diagnosis not present

## 2019-09-24 DIAGNOSIS — F0280 Dementia in other diseases classified elsewhere without behavioral disturbance: Secondary | ICD-10-CM | POA: Diagnosis not present

## 2019-09-24 DIAGNOSIS — R296 Repeated falls: Secondary | ICD-10-CM | POA: Diagnosis not present

## 2019-09-24 DIAGNOSIS — N183 Chronic kidney disease, stage 3 unspecified: Secondary | ICD-10-CM | POA: Diagnosis not present

## 2019-09-24 DIAGNOSIS — G309 Alzheimer's disease, unspecified: Secondary | ICD-10-CM | POA: Diagnosis not present

## 2019-09-27 DIAGNOSIS — N183 Chronic kidney disease, stage 3 unspecified: Secondary | ICD-10-CM | POA: Diagnosis not present

## 2019-09-27 DIAGNOSIS — R296 Repeated falls: Secondary | ICD-10-CM | POA: Diagnosis not present

## 2019-09-27 DIAGNOSIS — F0280 Dementia in other diseases classified elsewhere without behavioral disturbance: Secondary | ICD-10-CM | POA: Diagnosis not present

## 2019-09-27 DIAGNOSIS — I130 Hypertensive heart and chronic kidney disease with heart failure and stage 1 through stage 4 chronic kidney disease, or unspecified chronic kidney disease: Secondary | ICD-10-CM | POA: Diagnosis not present

## 2019-09-27 DIAGNOSIS — I509 Heart failure, unspecified: Secondary | ICD-10-CM | POA: Diagnosis not present

## 2019-09-27 DIAGNOSIS — G309 Alzheimer's disease, unspecified: Secondary | ICD-10-CM | POA: Diagnosis not present

## 2019-09-28 DIAGNOSIS — F0280 Dementia in other diseases classified elsewhere without behavioral disturbance: Secondary | ICD-10-CM | POA: Diagnosis not present

## 2019-09-28 DIAGNOSIS — R296 Repeated falls: Secondary | ICD-10-CM | POA: Diagnosis not present

## 2019-09-28 DIAGNOSIS — I509 Heart failure, unspecified: Secondary | ICD-10-CM | POA: Diagnosis not present

## 2019-09-28 DIAGNOSIS — N183 Chronic kidney disease, stage 3 unspecified: Secondary | ICD-10-CM | POA: Diagnosis not present

## 2019-09-28 DIAGNOSIS — I130 Hypertensive heart and chronic kidney disease with heart failure and stage 1 through stage 4 chronic kidney disease, or unspecified chronic kidney disease: Secondary | ICD-10-CM | POA: Diagnosis not present

## 2019-09-28 DIAGNOSIS — G309 Alzheimer's disease, unspecified: Secondary | ICD-10-CM | POA: Diagnosis not present

## 2019-09-29 ENCOUNTER — Non-Acute Institutional Stay (SKILLED_NURSING_FACILITY): Payer: Medicare Other | Admitting: Nurse Practitioner

## 2019-09-29 ENCOUNTER — Encounter: Payer: Self-pay | Admitting: Nurse Practitioner

## 2019-09-29 DIAGNOSIS — R627 Adult failure to thrive: Secondary | ICD-10-CM | POA: Diagnosis not present

## 2019-09-29 DIAGNOSIS — F0280 Dementia in other diseases classified elsewhere without behavioral disturbance: Secondary | ICD-10-CM | POA: Diagnosis not present

## 2019-09-29 DIAGNOSIS — G309 Alzheimer's disease, unspecified: Secondary | ICD-10-CM

## 2019-09-29 DIAGNOSIS — K5901 Slow transit constipation: Secondary | ICD-10-CM | POA: Diagnosis not present

## 2019-09-29 DIAGNOSIS — M545 Low back pain, unspecified: Secondary | ICD-10-CM

## 2019-09-29 DIAGNOSIS — F0150 Vascular dementia without behavioral disturbance: Secondary | ICD-10-CM | POA: Diagnosis not present

## 2019-09-29 DIAGNOSIS — G250 Essential tremor: Secondary | ICD-10-CM | POA: Diagnosis not present

## 2019-09-29 DIAGNOSIS — I482 Chronic atrial fibrillation, unspecified: Secondary | ICD-10-CM

## 2019-09-29 DIAGNOSIS — F339 Major depressive disorder, recurrent, unspecified: Secondary | ICD-10-CM | POA: Diagnosis not present

## 2019-09-29 NOTE — Assessment & Plan Note (Signed)
Her mood is stable, continue Mirtazapine, Quetiapine.

## 2019-09-29 NOTE — Assessment & Plan Note (Signed)
Stable, activity as tolerated, continue Tylenol

## 2019-09-29 NOTE — Assessment & Plan Note (Signed)
Still able to feed self, continue Propranolol.

## 2019-09-29 NOTE — Progress Notes (Signed)
Location:   [facility] Room Number: 31 Place of Service:  SNF (31) Provider:  Mast, Man NP  Mahlon Gammon, MD  Patient Care Team: Mahlon Gammon, MD as PCP - General (Internal Medicine) Nahser, Deloris Ping, MD as PCP - Cardiology (Cardiology) Mast, Man X, NP as Nurse Practitioner (Internal Medicine)  Extended Emergency Contact Information Primary Emergency Contact: Morgan,Tom Address: [address]          [place], [place] [address] [place] Home Phone: [phone] Relation: Friend Secondary Emergency Contact: Brandy Hale [place] of [place] Home Phone: [phone] Relation: Niece  Code Status:  DNR Goals of care: Advanced Directive information Advanced Directives 09/29/2019  Does Patient Have a Medical Advance Directive? Yes  Type of Advance Directive Out of facility DNR (pink MOST or yellow form);Healthcare Power of Attorney  Does patient want to make changes to medical advance directive? No - Patient declined  Copy of Healthcare Power of Attorney in Chart? Yes - validated most recent copy scanned in chart (See row information)  Would patient like information on creating a medical advance directive? -  Pre-existing out of facility DNR order (yellow form or pink MOST form) Yellow form placed in chart (order not valid for inpatient use)     Chief Complaint  Patient presents with  . Medical Management of Chronic Issues    HPI:  Pt is a 86 y.o. female seen today for medical management of chronic diseases.    The patient resides in SNF [facility] for safety, care assistance, her mood is stable, on Mirtazapine 15mg  qd, Quetiapine 12.5mg  qd. Chronic lower back is controlled on Tylenol 1000mg  bid, activity as tolerated. Essential tremor, chronic, able to feed self, on Propranolol 80mg  qd. Constipation, stable, takign Senokot S II qhs. Afib, heart rate is in control.    Past Medical History:  Diagnosis Date  . Anticoagulant  long-term use   . Atrial fibrillation (HCC)   . CHF (congestive heart failure) (HCC)   . Chronic anticoagulation   . Hypertension   . Raynaud phenomenon   . Tremor, essential    Past Surgical History:  Procedure Laterality Date  . CHOLECYSTECTOMY    . TONSILLECTOMY      Allergies  Allergen Reactions  . Fosamax [Alendronate]   . Other Nausea And Vomiting    Green pepper    Allergies as of 09/29/2019      Reactions   Fosamax [alendronate]    Other Nausea And Vomiting   Green pepper      Medication List       Accurate as of September 29, 2019  1:44 PM. If you have any questions, ask your nurse or doctor.        acetaminophen 500 MG tablet Commonly known as: TYLENOL Take 500 mg by mouth every 8 (eight) hours as needed for mild pain or moderate pain.   acetaminophen 500 MG tablet Commonly known as: TYLENOL Take 1,000 mg by mouth 2 (two) times daily. Not to exceed 3,00 mg daily.   Artificial Tears 0.2-0.2-1 % Soln Generic drug: Glycerin-Hypromellose-PEG 400 Place 1 drop into both eyes 3 (three) times daily.   Caltrate 600+D Plus Minerals 600-800 MG-UNIT Chew Chew 1 tablet by mouth 2 (two) times a day.   mirtazapine 15 MG tablet Commonly known as: REMERON Take 15 mg by mouth at bedtime.   NON FORMULARY Incontinent Care: apply Vaseline to buttocks after each incontinent episode Every Shift  NON FORMULARY Moisturizing Cream to extremities with morning and evening care Every Shift   NON FORMULARY Regular, Nectar Thick, Mech Soft Special Instructions: Regular diet with Nectar thick liquids by straw. 2 handled cup and divided plate with built-up utensils for meals.   propranolol ER 80 MG 24 hr capsule Commonly known as: INDERAL LA TAKE 1 CAPSULE BY MOUTH  DAILY   QUEtiapine 12.5 mg Tabs tablet Commonly known as: SEROQUEL Take 12.5 mg by mouth at bedtime. As needed   RESOURCE 2.0 PO Take 120 mLs by mouth 2 (two) times daily. MORNING 07:00 AM - 10:00 AM,  EVENING 07:00 PM - 10:00 PM   sennosides-docusate sodium 8.6-50 MG tablet Commonly known as: SENOKOT-S Take 2 tablets by mouth at bedtime.      ROS was provided with assistance of staff.  Review of Systems  Constitutional: Positive for fatigue. Negative for activity change, appetite change, chills, diaphoresis, fever and unexpected weight change.  HENT: Positive for hearing loss. Negative for congestion and voice change.   Eyes: Negative for visual disturbance.  Respiratory: Negative for cough, shortness of breath and wheezing.   Gastrointestinal: Negative for abdominal distention, abdominal pain, constipation, nausea and vomiting.  Genitourinary: Negative for difficulty urinating, dysuria and urgency.  Musculoskeletal: Positive for arthralgias, back pain and gait problem.  Skin: Negative for color change and pallor.  Neurological: Positive for tremors. Negative for dizziness, facial asymmetry, speech difficulty, weakness and headaches.       Dementia  Psychiatric/Behavioral: Positive for confusion. Negative for agitation, behavioral problems, hallucinations and sleep disturbance. The patient is not nervous/anxious.     Immunization History  Administered Date(s) Administered  . Influenza Whole 05/09/2018  . Influenza, High Dose Seasonal PF 05/09/2019  . Influenza-Unspecified 05/28/2017  . Moderna SARS-COVID-2 Vaccination 08/09/2019, 09/06/2019  . Pneumococcal Conjugate-13 07/13/2017  . Pneumococcal Polysaccharide-23 04/07/2002  . Tdap 07/13/2017, 10/08/2017   Pertinent  Health Maintenance Due  Topic Date Due  . INFLUENZA VACCINE  Completed  . DEXA SCAN  Completed  . PNA vac Low Risk Adult  Completed   Fall Risk  03/10/2019 10/05/2017  Falls in the past year? (No Data) No  Comment Emmi Telephone Survey: data to providers prior to load -  Number falls in past yr: (No Data) -  Comment Emmi Telephone Survey Actual Response =  -   Functional Status Survey:    Vitals:    09/29/19 0914  BP: 110/80  Pulse: 78  Resp: 18  Temp: 98.1 F (36.7 C)  SpO2: 91%  Weight: 95 lb 8 oz (43.3 kg)  Height: 5\' 4"  (1.626 m)   Body mass index is 16.39 kg/m. Physical Exam Vitals and nursing note reviewed.  Constitutional:      General: She is not in acute distress.    Appearance: Normal appearance. She is not ill-appearing.  HENT:     Head: Normocephalic and atraumatic.     Nose: Nose normal.     Mouth/Throat:     Mouth: Mucous membranes are moist.  Eyes:     Extraocular Movements: Extraocular movements intact.     Conjunctiva/sclera: Conjunctivae normal.     Pupils: Pupils are equal, round, and reactive to light.  Cardiovascular:     Rate and Rhythm: Normal rate. Rhythm irregular.     Heart sounds: No murmur.  Pulmonary:     Breath sounds: No wheezing or rales.  Abdominal:     General: Bowel sounds are normal. There is no distension.  Palpations: Abdomen is soft.     Tenderness: There is no abdominal tenderness. There is no guarding or rebound.  Musculoskeletal:     Cervical back: Normal range of motion and neck supple.     Right lower leg: No edema.     Left lower leg: No edema.  Skin:    General: Skin is warm and dry.  Neurological:     General: No focal deficit present.     Mental Status: She is alert. Mental status is at baseline.     Motor: No weakness.     Gait: Gait abnormal.     Comments: Tremor in fingers. Oriented to self.   Psychiatric:     Comments: Confused.      Labs reviewed: Recent Labs    02/13/19 0000 02/13/19 0000 03/13/19 0000 05/13/19 0000 05/21/19 0000  NA 147   < > 144 142 144  K 4.3   < > 3.9 3.8 3.7  CL 105  --   --  102 103  CO2 35  --   --  33 33  BUN 43*   < > 28* 28* 26*  CREATININE 1.4*   < > 1.3* 1.1 1.1  CALCIUM 10.9  --   --  9.5 9.9   < > = values in this interval not displayed.   Recent Labs    11/26/18 0000 05/13/19 0000 05/21/19 0000  AST 15 14 13   ALT 6* 6* 5*  ALKPHOS 38 43 41  PROT   --  6.3* 6.1*  ALBUMIN  --  3.8 3.7   Recent Labs    11/26/18 0000 05/13/19 0000 05/21/19 0000  WBC 7.8 8.1 9.1  NEUTROABS  --   --  5,651  HGB 13.0 12.5 12.0  HCT 38 37 36  PLT 160 204 183   Lab Results  Component Value Date   TSH 0.76 05/21/2019   No results found for: HGBA1C No results found for: CHOL, HDL, LDLCALC, LDLDIRECT, TRIG, CHOLHDL  Significant Diagnostic Results in last 30 days:  No results found.  Assessment/Plan Chronic a-fib (HCC) Heart rate is in control.   Mixed Alzheimer's and vascular dementia (HCC) Continue SNF [facility] for safety, care assistance, goal of care is comfort, continue Hospice service.   Slow transit constipation Stable, continue Senokot S II qd  Tremor, essential Still able to feed self, continue Propranolol.   Adult failure to thrive Weight is stable, lower body wt, BMI 16  Depression, recurrent (HCC) Her mood is stable, continue Mirtazapine, Quetiapine.   Lower back pain Stable, activity as tolerated, continue Tylenol      Family/ staff Communication: plan of care reviewed with the patient and charge nurse.   Labs/tests ordered:  none  Time spend 25 minutes.

## 2019-09-29 NOTE — Assessment & Plan Note (Signed)
Weight is stable, lower body wt, BMI 16

## 2019-09-29 NOTE — Assessment & Plan Note (Signed)
Heart rate is in control.  

## 2019-09-29 NOTE — Assessment & Plan Note (Signed)
Continue SNF [facility] for safety, care assistance, goal of care is comfort, continue Hospice service.

## 2019-09-29 NOTE — Assessment & Plan Note (Signed)
Stable, continue Senokot S II qd  

## 2019-10-02 DIAGNOSIS — F0280 Dementia in other diseases classified elsewhere without behavioral disturbance: Secondary | ICD-10-CM | POA: Diagnosis not present

## 2019-10-02 DIAGNOSIS — N183 Chronic kidney disease, stage 3 unspecified: Secondary | ICD-10-CM | POA: Diagnosis not present

## 2019-10-02 DIAGNOSIS — R296 Repeated falls: Secondary | ICD-10-CM | POA: Diagnosis not present

## 2019-10-02 DIAGNOSIS — G309 Alzheimer's disease, unspecified: Secondary | ICD-10-CM | POA: Diagnosis not present

## 2019-10-02 DIAGNOSIS — I130 Hypertensive heart and chronic kidney disease with heart failure and stage 1 through stage 4 chronic kidney disease, or unspecified chronic kidney disease: Secondary | ICD-10-CM | POA: Diagnosis not present

## 2019-10-02 DIAGNOSIS — I509 Heart failure, unspecified: Secondary | ICD-10-CM | POA: Diagnosis not present

## 2019-10-05 DIAGNOSIS — I509 Heart failure, unspecified: Secondary | ICD-10-CM | POA: Diagnosis not present

## 2019-10-05 DIAGNOSIS — F0280 Dementia in other diseases classified elsewhere without behavioral disturbance: Secondary | ICD-10-CM | POA: Diagnosis not present

## 2019-10-05 DIAGNOSIS — R296 Repeated falls: Secondary | ICD-10-CM | POA: Diagnosis not present

## 2019-10-05 DIAGNOSIS — I130 Hypertensive heart and chronic kidney disease with heart failure and stage 1 through stage 4 chronic kidney disease, or unspecified chronic kidney disease: Secondary | ICD-10-CM | POA: Diagnosis not present

## 2019-10-05 DIAGNOSIS — N183 Chronic kidney disease, stage 3 unspecified: Secondary | ICD-10-CM | POA: Diagnosis not present

## 2019-10-05 DIAGNOSIS — G309 Alzheimer's disease, unspecified: Secondary | ICD-10-CM | POA: Diagnosis not present

## 2019-10-06 DIAGNOSIS — Z681 Body mass index (BMI) 19 or less, adult: Secondary | ICD-10-CM | POA: Diagnosis not present

## 2019-10-06 DIAGNOSIS — F418 Other specified anxiety disorders: Secondary | ICD-10-CM | POA: Diagnosis not present

## 2019-10-06 DIAGNOSIS — D631 Anemia in chronic kidney disease: Secondary | ICD-10-CM | POA: Diagnosis not present

## 2019-10-06 DIAGNOSIS — Z741 Need for assistance with personal care: Secondary | ICD-10-CM | POA: Diagnosis not present

## 2019-10-06 DIAGNOSIS — E46 Unspecified protein-calorie malnutrition: Secondary | ICD-10-CM | POA: Diagnosis not present

## 2019-10-06 DIAGNOSIS — F0280 Dementia in other diseases classified elsewhere without behavioral disturbance: Secondary | ICD-10-CM | POA: Diagnosis not present

## 2019-10-06 DIAGNOSIS — I509 Heart failure, unspecified: Secondary | ICD-10-CM | POA: Diagnosis not present

## 2019-10-06 DIAGNOSIS — S22080D Wedge compression fracture of T11-T12 vertebra, subsequent encounter for fracture with routine healing: Secondary | ICD-10-CM | POA: Diagnosis not present

## 2019-10-06 DIAGNOSIS — I130 Hypertensive heart and chronic kidney disease with heart failure and stage 1 through stage 4 chronic kidney disease, or unspecified chronic kidney disease: Secondary | ICD-10-CM | POA: Diagnosis not present

## 2019-10-06 DIAGNOSIS — R159 Full incontinence of feces: Secondary | ICD-10-CM | POA: Diagnosis not present

## 2019-10-06 DIAGNOSIS — G309 Alzheimer's disease, unspecified: Secondary | ICD-10-CM | POA: Diagnosis not present

## 2019-10-06 DIAGNOSIS — I7300 Raynaud's syndrome without gangrene: Secondary | ICD-10-CM | POA: Diagnosis not present

## 2019-10-06 DIAGNOSIS — R32 Unspecified urinary incontinence: Secondary | ICD-10-CM | POA: Diagnosis not present

## 2019-10-06 DIAGNOSIS — N183 Chronic kidney disease, stage 3 unspecified: Secondary | ICD-10-CM | POA: Diagnosis not present

## 2019-10-06 DIAGNOSIS — I4891 Unspecified atrial fibrillation: Secondary | ICD-10-CM | POA: Diagnosis not present

## 2019-10-06 DIAGNOSIS — R296 Repeated falls: Secondary | ICD-10-CM | POA: Diagnosis not present

## 2019-10-06 DIAGNOSIS — Z87440 Personal history of urinary (tract) infections: Secondary | ICD-10-CM | POA: Diagnosis not present

## 2019-10-06 DIAGNOSIS — G250 Essential tremor: Secondary | ICD-10-CM | POA: Diagnosis not present

## 2019-10-06 DIAGNOSIS — Z947 Corneal transplant status: Secondary | ICD-10-CM | POA: Diagnosis not present

## 2019-10-06 DIAGNOSIS — H01003 Unspecified blepharitis right eye, unspecified eyelid: Secondary | ICD-10-CM | POA: Diagnosis not present

## 2019-10-07 DIAGNOSIS — I509 Heart failure, unspecified: Secondary | ICD-10-CM | POA: Diagnosis not present

## 2019-10-07 DIAGNOSIS — G309 Alzheimer's disease, unspecified: Secondary | ICD-10-CM | POA: Diagnosis not present

## 2019-10-07 DIAGNOSIS — R296 Repeated falls: Secondary | ICD-10-CM | POA: Diagnosis not present

## 2019-10-07 DIAGNOSIS — I130 Hypertensive heart and chronic kidney disease with heart failure and stage 1 through stage 4 chronic kidney disease, or unspecified chronic kidney disease: Secondary | ICD-10-CM | POA: Diagnosis not present

## 2019-10-07 DIAGNOSIS — F0280 Dementia in other diseases classified elsewhere without behavioral disturbance: Secondary | ICD-10-CM | POA: Diagnosis not present

## 2019-10-07 DIAGNOSIS — N183 Chronic kidney disease, stage 3 unspecified: Secondary | ICD-10-CM | POA: Diagnosis not present

## 2019-10-08 ENCOUNTER — Non-Acute Institutional Stay (SKILLED_NURSING_FACILITY): Payer: Medicare Other | Admitting: Nurse Practitioner

## 2019-10-08 ENCOUNTER — Encounter: Payer: Self-pay | Admitting: Nurse Practitioner

## 2019-10-08 DIAGNOSIS — G250 Essential tremor: Secondary | ICD-10-CM | POA: Diagnosis not present

## 2019-10-08 DIAGNOSIS — F339 Major depressive disorder, recurrent, unspecified: Secondary | ICD-10-CM

## 2019-10-08 DIAGNOSIS — R269 Unspecified abnormalities of gait and mobility: Secondary | ICD-10-CM | POA: Diagnosis not present

## 2019-10-08 DIAGNOSIS — R296 Repeated falls: Secondary | ICD-10-CM | POA: Diagnosis not present

## 2019-10-08 DIAGNOSIS — M545 Low back pain, unspecified: Secondary | ICD-10-CM

## 2019-10-08 DIAGNOSIS — G309 Alzheimer's disease, unspecified: Secondary | ICD-10-CM

## 2019-10-08 DIAGNOSIS — I482 Chronic atrial fibrillation, unspecified: Secondary | ICD-10-CM | POA: Diagnosis not present

## 2019-10-08 DIAGNOSIS — F0150 Vascular dementia without behavioral disturbance: Secondary | ICD-10-CM | POA: Diagnosis not present

## 2019-10-08 DIAGNOSIS — F0280 Dementia in other diseases classified elsewhere without behavioral disturbance: Secondary | ICD-10-CM

## 2019-10-08 NOTE — Assessment & Plan Note (Signed)
10/07/19 fall when the patient was found sitting in the floor with one bare foot and one nonskid sock on near the bathroom door, no recollection of the event. Poor safety awareness, increased frailty are contributory, close supervision/assistance needed.

## 2019-10-08 NOTE — Assessment & Plan Note (Signed)
Worse related to increased frailty, continue SNF [facility] for safety, care assistance, her gaol of care is comfort measures, close supervision/assistance needed.

## 2019-10-08 NOTE — Assessment & Plan Note (Signed)
Stable, continue Tylenol.  

## 2019-10-08 NOTE — Assessment & Plan Note (Signed)
Heart rate is in control.  

## 2019-10-08 NOTE — Assessment & Plan Note (Signed)
Continue SNF [facility] for safety, care assistance. Her goal of care is comfort measures.

## 2019-10-08 NOTE — Assessment & Plan Note (Signed)
Her mood is stable, continue Mirtazapine, prn Quetiapine.

## 2019-10-08 NOTE — Progress Notes (Signed)
Location:   [facility] Room Number: 31 Place of Service:  SNF (31) Provider:  Teagen Bucio, NP  Mahlon Gammon, MD  Patient Care Team: Mahlon Gammon, MD as PCP - General (Internal Medicine) Nahser, Deloris Ping, MD as PCP - Cardiology (Cardiology) Syncere Eble X, NP as Nurse Practitioner (Internal Medicine)  Extended Emergency Contact Information Primary Emergency Contact: Morgan,Tom Address: [address]          [place], [place] [address] [place] Home Phone: [phone] Relation: Friend Secondary Emergency Contact: Brandy Hale [place] Home Phone: [phone] Relation: Niece  Code Status:  DNR Goals of care: Advanced Directive information Advanced Directives 10/08/2019  Does Patient Have a Medical Advance Directive? Yes  Type of Estate agent of [place];Out of facility DNR (pink MOST or yellow form)  Does patient want to make changes to medical advance directive? No - Patient declined  Copy of Healthcare Power of Attorney in Chart? Yes - validated most recent copy scanned in chart (See row information)  Would patient like information on creating a medical advance directive? -  Pre-existing out of facility DNR order (yellow form or pink MOST form) Yellow form placed in chart (order not valid for inpatient use)     Chief Complaint  Patient presents with  . Acute Visit    Fall    HPI:  Pt is a 86 y.o. female seen today for an acute visit for 10/07/19 fall when the patient was found sitting in the floor with one bare foot and one nonskid sock on near the bathroom door, no recollection of the event. The patient resides in SNF [facility] for safety, care assistance, ambulates with walker,  Her mood is managed with Quetiapine 12.5mg  prn, Mirtazapine 15mg  qd. Chronic lower back pain, stable, on Tylenol 1000mg  bid. Essential tremor, stable, on Propranolol 80mg  qd. Afib, heart rate is in control.     Past  Medical History:  Diagnosis Date  . Anticoagulant long-term use   . Atrial fibrillation (HCC)   . CHF (congestive heart failure) (HCC)   . Chronic anticoagulation   . Hypertension   . Raynaud phenomenon   . Tremor, essential    Past Surgical History:  Procedure Laterality Date  . CHOLECYSTECTOMY    . TONSILLECTOMY      Allergies  Allergen Reactions  . Fosamax [Alendronate]   . Other Nausea And Vomiting    Green pepper    Allergies as of 10/08/2019      Reactions   Fosamax [alendronate]    Other Nausea And Vomiting   Green pepper      Medication List       Accurate as of October 08, 2019  3:38 PM. If you have any questions, ask your nurse or doctor.        STOP taking these medications   NON FORMULARY Stopped by: Sherri Mcarthy X Kaelen Caughlin, NP   NON FORMULARY Stopped by: Lekeshia Kram X Thurlow Gallaga, NP     TAKE these medications   acetaminophen 500 MG tablet Commonly known as: TYLENOL Take 500 mg by mouth every 8 (eight) hours as needed for mild pain or moderate pain.   acetaminophen 500 MG tablet Commonly known as: TYLENOL Take 1,000 mg by mouth 2 (two) times daily. Not to exceed 3,00 mg daily.   Artificial Tears 0.2-0.2-1 % Soln Generic drug: Glycerin-Hypromellose-PEG 400 Place 1 drop into both eyes 3 (three) times daily.   Caltrate  600+D Plus Minerals 600-800 MG-UNIT Chew Chew 1 tablet by mouth 2 (two) times a day.   mirtazapine 15 MG tablet Commonly known as: REMERON Take 15 mg by mouth at bedtime.   NON FORMULARY Regular, Nectar Thick, Mech Soft Special Instructions: Regular diet with Nectar thick liquids by straw. 2 handled cup and divided plate with built-up utensils for meals.   propranolol ER 80 MG 24 hr capsule Commonly known as: INDERAL LA TAKE 1 CAPSULE BY MOUTH  DAILY   QUEtiapine 25 MG tablet Commonly known as: SEROQUEL Take 12.5 mg by mouth at bedtime as needed. What changed: Another medication with the same name was removed. Continue taking this medication, and  follow the directions you see here. Changed by: Ellisa Devivo X Anuar Walgren, NP   RESOURCE 2.0 PO Take 120 mLs by mouth 2 (two) times daily. MORNING 07:00 AM - 10:00 AM, EVENING 07:00 PM - 10:00 PM   sennosides-docusate sodium 8.6-50 MG tablet Commonly known as: SENOKOT-S Take 2 tablets by mouth at bedtime.      ROS was provided with assistance of staff.  Review of Systems  Constitutional: Positive for fatigue. Negative for activity change, appetite change, chills, diaphoresis and fever.  HENT: Positive for hearing loss. Negative for congestion and voice change.   Eyes: Negative for visual disturbance.  Respiratory: Negative for cough and shortness of breath.   Cardiovascular: Negative for leg swelling.  Gastrointestinal: Negative for abdominal pain, constipation, nausea and vomiting.  Genitourinary: Negative for difficulty urinating, dysuria and urgency.  Musculoskeletal: Positive for arthralgias, back pain and gait problem.  Skin: Negative for color change and pallor.  Neurological: Positive for tremors. Negative for dizziness, speech difficulty, weakness and headaches.       Dementia  Psychiatric/Behavioral: Positive for confusion. Negative for agitation, behavioral problems and sleep disturbance. The patient is not nervous/anxious.     Immunization History  Administered Date(s) Administered  . Influenza Whole 05/09/2018  . Influenza, High Dose Seasonal PF 05/09/2019  . Influenza-Unspecified 05/28/2017  . Moderna SARS-COVID-2 Vaccination 08/09/2019, 09/06/2019  . Pneumococcal Conjugate-13 07/13/2017  . Pneumococcal Polysaccharide-23 04/07/2002  . Tdap 07/13/2017, 10/08/2017   Pertinent  Health Maintenance Due  Topic Date Due  . INFLUENZA VACCINE  Completed  . DEXA SCAN  Completed  . PNA vac Low Risk Adult  Completed   Fall Risk  03/10/2019 10/05/2017  Falls in the past year? (No Data) No  Comment Emmi Telephone Survey: data to providers prior to load -  Number falls in past yr: (No Data)  -  Comment Emmi Telephone Survey Actual Response =  -   Functional Status Survey:    Vitals:   10/08/19 1441  BP: 120/60  Pulse: 72  Resp: 20  Temp: (!) 96 F (35.6 C)  SpO2: 92%  Weight: 97 lb 6.4 oz (44.2 kg)  Height: 5\' 4"  (1.626 m)   Body mass index is 16.72 kg/m. Physical Exam Vitals and nursing note reviewed.  Constitutional:      General: She is not in acute distress.    Appearance: Normal appearance. She is not ill-appearing, toxic-appearing or diaphoretic.  HENT:     Head: Normocephalic and atraumatic.     Nose: Nose normal.     Mouth/Throat:     Mouth: Mucous membranes are moist.  Eyes:     Extraocular Movements: Extraocular movements intact.     Conjunctiva/sclera: Conjunctivae normal.     Pupils: Pupils are equal, round, and reactive to light.  Cardiovascular:  Rate and Rhythm: Normal rate. Rhythm irregular.     Heart sounds: No murmur.  Pulmonary:     Breath sounds: No wheezing, rhonchi or rales.  Chest:     Chest wall: No tenderness.  Abdominal:     General: Bowel sounds are normal. There is no distension.     Palpations: Abdomen is soft.     Tenderness: There is no abdominal tenderness. There is no right CVA tenderness, left CVA tenderness, guarding or rebound.  Musculoskeletal:     Cervical back: Normal range of motion and neck supple.     Right lower leg: No edema.     Left lower leg: No edema.  Skin:    General: Skin is warm and dry.  Neurological:     General: No focal deficit present.     Mental Status: She is alert. Mental status is at baseline.     Motor: No weakness.     Gait: Gait abnormal.     Comments: Tremor in fingers. Oriented to self.   Psychiatric:     Comments: Confused.      Labs reviewed: Recent Labs    02/13/19 0000 02/13/19 0000 03/13/19 0000 05/13/19 0000 05/21/19 0000  NA 147   < > 144 142 144  K 4.3   < > 3.9 3.8 3.7  CL 105  --   --  102 103  CO2 35  --   --  33 33  BUN 43*   < > 28* 28* 26*    CREATININE 1.4*   < > 1.3* 1.1 1.1  CALCIUM 10.9  --   --  9.5 9.9   < > = values in this interval not displayed.   Recent Labs    11/26/18 0000 05/13/19 0000 05/21/19 0000  AST 15 14 13   ALT 6* 6* 5*  ALKPHOS 38 43 41  PROT  --  6.3* 6.1*  ALBUMIN  --  3.8 3.7   Recent Labs    11/26/18 0000 05/13/19 0000 05/21/19 0000  WBC 7.8 8.1 9.1  NEUTROABS  --   --  5,651  HGB 13.0 12.5 12.0  HCT 38 37 36  PLT 160 204 183   Lab Results  Component Value Date   TSH 0.76 05/21/2019   No results found for: HGBA1C No results found for: CHOL, HDL, LDLCALC, LDLDIRECT, TRIG, CHOLHDL  Significant Diagnostic Results in last 30 days:  No results found.  Assessment/Plan Frequent falls 10/07/19 fall when the patient was found sitting in the floor with one bare foot and one nonskid sock on near the bathroom door, no recollection of the event. Poor safety awareness, increased frailty are contributory, close supervision/assistance needed.    Depression, recurrent ([place]) Her mood is stable, continue Mirtazapine, prn Quetiapine.   Lower back pain Stable, continue Tylenol.   Gait abnormality Worse related to increased frailty, continue SNF [facility] for safety, care assistance, her gaol of care is comfort measures, close supervision/assistance needed.   Mixed Alzheimer's and vascular dementia ([place]) Continue SNF [facility] for safety, care assistance. Her goal of care is comfort measures.   Tremor, essential Fine tremor in fingers, continue Propranolol.   Chronic a-fib (HCC) Heart rate is in control.      Family/ staff Communication: plan of care reviewed with the patient and charge nurse.   Labs/tests ordered:  none  Time spend 25 minutes.

## 2019-10-08 NOTE — Assessment & Plan Note (Signed)
Fine tremor in fingers, continue Propranolol.

## 2019-10-09 DIAGNOSIS — I509 Heart failure, unspecified: Secondary | ICD-10-CM | POA: Diagnosis not present

## 2019-10-09 DIAGNOSIS — N183 Chronic kidney disease, stage 3 unspecified: Secondary | ICD-10-CM | POA: Diagnosis not present

## 2019-10-09 DIAGNOSIS — R296 Repeated falls: Secondary | ICD-10-CM | POA: Diagnosis not present

## 2019-10-09 DIAGNOSIS — G309 Alzheimer's disease, unspecified: Secondary | ICD-10-CM | POA: Diagnosis not present

## 2019-10-09 DIAGNOSIS — I130 Hypertensive heart and chronic kidney disease with heart failure and stage 1 through stage 4 chronic kidney disease, or unspecified chronic kidney disease: Secondary | ICD-10-CM | POA: Diagnosis not present

## 2019-10-09 DIAGNOSIS — F0280 Dementia in other diseases classified elsewhere without behavioral disturbance: Secondary | ICD-10-CM | POA: Diagnosis not present

## 2019-10-12 DIAGNOSIS — G309 Alzheimer's disease, unspecified: Secondary | ICD-10-CM | POA: Diagnosis not present

## 2019-10-12 DIAGNOSIS — N183 Chronic kidney disease, stage 3 unspecified: Secondary | ICD-10-CM | POA: Diagnosis not present

## 2019-10-12 DIAGNOSIS — I130 Hypertensive heart and chronic kidney disease with heart failure and stage 1 through stage 4 chronic kidney disease, or unspecified chronic kidney disease: Secondary | ICD-10-CM | POA: Diagnosis not present

## 2019-10-12 DIAGNOSIS — I509 Heart failure, unspecified: Secondary | ICD-10-CM | POA: Diagnosis not present

## 2019-10-12 DIAGNOSIS — F0280 Dementia in other diseases classified elsewhere without behavioral disturbance: Secondary | ICD-10-CM | POA: Diagnosis not present

## 2019-10-12 DIAGNOSIS — R296 Repeated falls: Secondary | ICD-10-CM | POA: Diagnosis not present

## 2019-10-15 DIAGNOSIS — R296 Repeated falls: Secondary | ICD-10-CM | POA: Diagnosis not present

## 2019-10-15 DIAGNOSIS — F0280 Dementia in other diseases classified elsewhere without behavioral disturbance: Secondary | ICD-10-CM | POA: Diagnosis not present

## 2019-10-15 DIAGNOSIS — I509 Heart failure, unspecified: Secondary | ICD-10-CM | POA: Diagnosis not present

## 2019-10-15 DIAGNOSIS — I130 Hypertensive heart and chronic kidney disease with heart failure and stage 1 through stage 4 chronic kidney disease, or unspecified chronic kidney disease: Secondary | ICD-10-CM | POA: Diagnosis not present

## 2019-10-15 DIAGNOSIS — Z20828 Contact with and (suspected) exposure to other viral communicable diseases: Secondary | ICD-10-CM | POA: Diagnosis not present

## 2019-10-15 DIAGNOSIS — N183 Chronic kidney disease, stage 3 unspecified: Secondary | ICD-10-CM | POA: Diagnosis not present

## 2019-10-15 DIAGNOSIS — G309 Alzheimer's disease, unspecified: Secondary | ICD-10-CM | POA: Diagnosis not present

## 2019-10-16 DIAGNOSIS — G309 Alzheimer's disease, unspecified: Secondary | ICD-10-CM | POA: Diagnosis not present

## 2019-10-16 DIAGNOSIS — N183 Chronic kidney disease, stage 3 unspecified: Secondary | ICD-10-CM | POA: Diagnosis not present

## 2019-10-16 DIAGNOSIS — I130 Hypertensive heart and chronic kidney disease with heart failure and stage 1 through stage 4 chronic kidney disease, or unspecified chronic kidney disease: Secondary | ICD-10-CM | POA: Diagnosis not present

## 2019-10-16 DIAGNOSIS — I509 Heart failure, unspecified: Secondary | ICD-10-CM | POA: Diagnosis not present

## 2019-10-16 DIAGNOSIS — R296 Repeated falls: Secondary | ICD-10-CM | POA: Diagnosis not present

## 2019-10-16 DIAGNOSIS — F0280 Dementia in other diseases classified elsewhere without behavioral disturbance: Secondary | ICD-10-CM | POA: Diagnosis not present

## 2019-10-17 DIAGNOSIS — G309 Alzheimer's disease, unspecified: Secondary | ICD-10-CM | POA: Diagnosis not present

## 2019-10-17 DIAGNOSIS — I509 Heart failure, unspecified: Secondary | ICD-10-CM | POA: Diagnosis not present

## 2019-10-17 DIAGNOSIS — R296 Repeated falls: Secondary | ICD-10-CM | POA: Diagnosis not present

## 2019-10-17 DIAGNOSIS — I130 Hypertensive heart and chronic kidney disease with heart failure and stage 1 through stage 4 chronic kidney disease, or unspecified chronic kidney disease: Secondary | ICD-10-CM | POA: Diagnosis not present

## 2019-10-17 DIAGNOSIS — N183 Chronic kidney disease, stage 3 unspecified: Secondary | ICD-10-CM | POA: Diagnosis not present

## 2019-10-17 DIAGNOSIS — F0280 Dementia in other diseases classified elsewhere without behavioral disturbance: Secondary | ICD-10-CM | POA: Diagnosis not present

## 2019-10-19 DIAGNOSIS — I130 Hypertensive heart and chronic kidney disease with heart failure and stage 1 through stage 4 chronic kidney disease, or unspecified chronic kidney disease: Secondary | ICD-10-CM | POA: Diagnosis not present

## 2019-10-19 DIAGNOSIS — F0280 Dementia in other diseases classified elsewhere without behavioral disturbance: Secondary | ICD-10-CM | POA: Diagnosis not present

## 2019-10-19 DIAGNOSIS — R296 Repeated falls: Secondary | ICD-10-CM | POA: Diagnosis not present

## 2019-10-19 DIAGNOSIS — G309 Alzheimer's disease, unspecified: Secondary | ICD-10-CM | POA: Diagnosis not present

## 2019-10-19 DIAGNOSIS — I509 Heart failure, unspecified: Secondary | ICD-10-CM | POA: Diagnosis not present

## 2019-10-19 DIAGNOSIS — N183 Chronic kidney disease, stage 3 unspecified: Secondary | ICD-10-CM | POA: Diagnosis not present

## 2019-10-22 ENCOUNTER — Encounter: Payer: Self-pay | Admitting: Nurse Practitioner

## 2019-10-22 ENCOUNTER — Non-Acute Institutional Stay (SKILLED_NURSING_FACILITY): Payer: Medicare Other | Admitting: Nurse Practitioner

## 2019-10-22 DIAGNOSIS — F0150 Vascular dementia without behavioral disturbance: Secondary | ICD-10-CM

## 2019-10-22 DIAGNOSIS — I482 Chronic atrial fibrillation, unspecified: Secondary | ICD-10-CM | POA: Diagnosis not present

## 2019-10-22 DIAGNOSIS — F0280 Dementia in other diseases classified elsewhere without behavioral disturbance: Secondary | ICD-10-CM | POA: Diagnosis not present

## 2019-10-22 DIAGNOSIS — M545 Low back pain, unspecified: Secondary | ICD-10-CM

## 2019-10-22 DIAGNOSIS — G250 Essential tremor: Secondary | ICD-10-CM | POA: Diagnosis not present

## 2019-10-22 DIAGNOSIS — K5901 Slow transit constipation: Secondary | ICD-10-CM | POA: Diagnosis not present

## 2019-10-22 DIAGNOSIS — F339 Major depressive disorder, recurrent, unspecified: Secondary | ICD-10-CM

## 2019-10-22 DIAGNOSIS — G309 Alzheimer's disease, unspecified: Secondary | ICD-10-CM | POA: Diagnosis not present

## 2019-10-22 DIAGNOSIS — Z20828 Contact with and (suspected) exposure to other viral communicable diseases: Secondary | ICD-10-CM | POA: Diagnosis not present

## 2019-10-22 NOTE — Assessment & Plan Note (Signed)
Heart rate is in control.  

## 2019-10-22 NOTE — Assessment & Plan Note (Signed)
Stable, continue Senokot S 

## 2019-10-22 NOTE — Assessment & Plan Note (Signed)
Her mood is stable, continue Mirtazapine, Quetiapine.  

## 2019-10-22 NOTE — Progress Notes (Signed)
Location:   [facility] Room Number: 31 Place of Service:  SNF (31) Provider:  Tiffony Kite, NP  Mahlon Gammon, MD  Patient Care Team: Mahlon Gammon, MD as PCP - General (Internal Medicine) Nahser, Deloris Ping, MD as PCP - Cardiology (Cardiology) Shanavia Makela X, NP as Nurse Practitioner (Internal Medicine)  Extended Emergency Contact Information Primary Emergency Contact: Morgan,Tom Address: [address]          [place], [place] [address] [place] of [place] Home Phone: [phone] Relation: Friend Secondary Emergency Contact: Brandy Hale [place] of [place] Home Phone: [phone] Relation: Niece  Code Status:  DNR Goals of care: Advanced Directive information Advanced Directives 10/22/2019  Does Patient Have a Medical Advance Directive? Yes  Type of Estate agent of [place];Out of facility DNR (pink MOST or yellow form)  Does patient want to make changes to medical advance directive? No - Patient declined  Copy of Healthcare Power of Attorney in Chart? Yes - validated most recent copy scanned in chart (See row information)  Would patient like information on creating a medical advance directive? -  Pre-existing out of facility DNR order (yellow form or pink MOST form) Yellow form placed in chart (order not valid for inpatient use)     Chief Complaint  Patient presents with  . Medical Management of Chronic Issues    Routine Visit    HPI:  Pt is a 86 y.o. female seen today for medical management of chronic diseases.    The patient resides in SNF [facility] for safety, care assistance, ambulates with walker with SBA. Hx of Afib, heart rate is in control. Essential tremor, in hands, still feeing self, on Propranolol. Chronic lower back pain, stable, on Tylenol 1000mg  bid. Her mood is stable on Mirtazapine 15mg  qd, Quetiapine 12.5mg  prn. Constipation, stable, on Senokot S II qd.    Past Medical History:  Diagnosis Date   . Anticoagulant long-term use   . Atrial fibrillation (HCC)   . CHF (congestive heart failure) (HCC)   . Chronic anticoagulation   . Hypertension   . Raynaud phenomenon   . Tremor, essential    Past Surgical History:  Procedure Laterality Date  . CHOLECYSTECTOMY    . TONSILLECTOMY      Allergies  Allergen Reactions  . Fosamax [Alendronate]   . Other Nausea And Vomiting    Green pepper    Allergies as of 10/22/2019      Reactions   Fosamax [alendronate]    Other Nausea And Vomiting   Green pepper      Medication List       Accurate as of October 22, 2019  1:04 PM. If you have any questions, ask your nurse or doctor.        acetaminophen 500 MG tablet Commonly known as: TYLENOL Take 500 mg by mouth every 8 (eight) hours as needed for mild pain or moderate pain.   acetaminophen 500 MG tablet Commonly known as: TYLENOL Take 1,000 mg by mouth 2 (two) times daily. Not to exceed 3,00 mg daily.   Artificial Tears 0.2-0.2-1 % Soln Generic drug: Glycerin-Hypromellose-PEG 400 Place 1 drop into both eyes 3 (three) times daily.   Caltrate 600+D Plus Minerals 600-800 MG-UNIT Chew Chew 1 tablet by mouth 2 (two) times a day.   mirtazapine 15 MG tablet Commonly known as: REMERON Take 15 mg by mouth at bedtime.   NON FORMULARY Regular, Nectar Thick, Mech Soft Special Instructions:  Regular diet with Nectar thick liquids by straw. 2 handled cup and divided plate with built-up utensils for meals.   propranolol ER 80 MG 24 hr capsule Commonly known as: INDERAL LA TAKE 1 CAPSULE BY MOUTH  DAILY   QUEtiapine 25 MG tablet Commonly known as: SEROQUEL Take 12.5 mg by mouth at bedtime as needed.   RESOURCE 2.0 PO Take 120 mLs by mouth 2 (two) times daily. MORNING 07:00 AM - 10:00 AM, EVENING 07:00 PM - 10:00 PM   sennosides-docusate sodium 8.6-50 MG tablet Commonly known as: SENOKOT-S Take 2 tablets by mouth at bedtime.      ROS was provided with assistance of staff.    Review of Systems  Constitutional: Negative for activity change, appetite change, chills, diaphoresis, fatigue, fever and unexpected weight change.  HENT: Positive for hearing loss. Negative for congestion and voice change.   Eyes: Negative for visual disturbance.  Respiratory: Negative for shortness of breath.   Cardiovascular: Negative for leg swelling.  Gastrointestinal: Negative for abdominal pain, constipation, diarrhea, nausea and vomiting.  Genitourinary: Negative for difficulty urinating, dysuria and urgency.  Musculoskeletal: Positive for arthralgias, back pain and gait problem.  Skin: Negative for color change.  Neurological: Positive for tremors. Negative for syncope, facial asymmetry, speech difficulty, weakness and light-headedness.       Dementia  Psychiatric/Behavioral: Positive for confusion. Negative for agitation, behavioral problems and sleep disturbance. The patient is not nervous/anxious.     Immunization History  Administered Date(s) Administered  . Influenza Whole 05/09/2018  . Influenza, High Dose Seasonal PF 05/09/2019  . Influenza-Unspecified 05/28/2017  . Moderna SARS-COVID-2 Vaccination 08/09/2019, 09/06/2019  . Pneumococcal Conjugate-13 07/13/2017  . Pneumococcal Polysaccharide-23 04/07/2002  . Tdap 07/13/2017, 10/08/2017   Pertinent  Health Maintenance Due  Topic Date Due  . INFLUENZA VACCINE  Completed  . DEXA SCAN  Completed  . PNA vac Low Risk Adult  Completed   Fall Risk  03/10/2019 10/05/2017  Falls in the past year? (No Data) No  Comment Emmi Telephone Survey: data to providers prior to load -  Number falls in past yr: (No Data) -  Comment Emmi Telephone Survey Actual Response =  -   Functional Status Survey:    Vitals:   10/22/19 1209  BP: 130/66  Pulse: 70  Resp: 20  Temp: (!) 97.1 F (36.2 C)  SpO2: 94%  Weight: 97 lb 6.4 oz (44.2 kg)  Height: 5\' 4"  (1.626 m)   Body mass index is 16.72 kg/m. Physical Exam Vitals and nursing  note reviewed.  Constitutional:      General: She is not in acute distress.    Appearance: Normal appearance. She is not ill-appearing.     Comments: Low body weight, but stable.   HENT:     Head: Normocephalic and atraumatic.     Nose: Nose normal.     Mouth/Throat:     Mouth: Mucous membranes are moist.  Eyes:     Extraocular Movements: Extraocular movements intact.     Conjunctiva/sclera: Conjunctivae normal.     Pupils: Pupils are equal, round, and reactive to light.  Cardiovascular:     Rate and Rhythm: Normal rate. Rhythm irregular.     Heart sounds: No murmur.  Pulmonary:     Breath sounds: No wheezing or rales.  Abdominal:     General: Bowel sounds are normal. There is no distension.     Palpations: Abdomen is soft.     Tenderness: There is no abdominal tenderness. There  is no guarding or rebound.  Musculoskeletal:     Cervical back: Normal range of motion and neck supple.     Right lower leg: No edema.     Left lower leg: No edema.  Skin:    General: Skin is warm and dry.  Neurological:     General: No focal deficit present.     Mental Status: She is alert. Mental status is at baseline.     Motor: No weakness.     Coordination: Coordination abnormal.     Gait: Gait abnormal.     Comments: Tremor in fingers. Oriented to self.   Psychiatric:     Comments: Confused.      Labs reviewed: Recent Labs    02/13/19 0000 02/13/19 0000 03/13/19 0000 05/13/19 0000 05/21/19 0000  NA 147   < > 144 142 144  K 4.3   < > 3.9 3.8 3.7  CL 105  --   --  102 103  CO2 35  --   --  33 33  BUN 43*   < > 28* 28* 26*  CREATININE 1.4*   < > 1.3* 1.1 1.1  CALCIUM 10.9  --   --  9.5 9.9   < > = values in this interval not displayed.   Recent Labs    11/26/18 0000 05/13/19 0000 05/21/19 0000  AST 15 14 13   ALT 6* 6* 5*  ALKPHOS 38 43 41  PROT  --  6.3* 6.1*  ALBUMIN  --  3.8 3.7   Recent Labs    11/26/18 0000 05/13/19 0000 05/21/19 0000  WBC 7.8 8.1 9.1    NEUTROABS  --   --  5,651  HGB 13.0 12.5 12.0  HCT 38 37 36  PLT 160 204 183   Lab Results  Component Value Date   TSH 0.76 05/21/2019   No results found for: HGBA1C No results found for: CHOL, HDL, LDLCALC, LDLDIRECT, TRIG, CHOLHDL  Significant Diagnostic Results in last 30 days:  No results found.  Assessment/Plan Chronic a-fib (HCC) Heart rate is in control.   Mixed Alzheimer's and vascular dementia (HCC) Advanced stage, continue SNF [facility] for safety, care assistance. Continue Hospice service.   Slow transit constipation Stable, continue Senokot S  Tremor, essential In hands, still feeds self, continue Propranolol.   Depression, recurrent (HCC) Her mood is stable, continue Mirtazapine, Quetiapine.   Lower back pain Stable, continue Tylenol.      Family/ staff Communication: plan of care reviewed with the patient and charge nurse.   Labs/tests ordered:  none  Time spend 25 minutes.

## 2019-10-22 NOTE — Assessment & Plan Note (Signed)
In hands, still feeds self, continue Propranolol.

## 2019-10-22 NOTE — Assessment & Plan Note (Signed)
Stable, continue Tylenol.  

## 2019-10-22 NOTE — Assessment & Plan Note (Signed)
Advanced stage, continue SNF [facility] for safety, care assistance. Continue Hospice service.

## 2019-10-23 DIAGNOSIS — I130 Hypertensive heart and chronic kidney disease with heart failure and stage 1 through stage 4 chronic kidney disease, or unspecified chronic kidney disease: Secondary | ICD-10-CM | POA: Diagnosis not present

## 2019-10-23 DIAGNOSIS — N183 Chronic kidney disease, stage 3 unspecified: Secondary | ICD-10-CM | POA: Diagnosis not present

## 2019-10-23 DIAGNOSIS — G309 Alzheimer's disease, unspecified: Secondary | ICD-10-CM | POA: Diagnosis not present

## 2019-10-23 DIAGNOSIS — I509 Heart failure, unspecified: Secondary | ICD-10-CM | POA: Diagnosis not present

## 2019-10-23 DIAGNOSIS — R296 Repeated falls: Secondary | ICD-10-CM | POA: Diagnosis not present

## 2019-10-23 DIAGNOSIS — F0280 Dementia in other diseases classified elsewhere without behavioral disturbance: Secondary | ICD-10-CM | POA: Diagnosis not present

## 2019-10-26 DIAGNOSIS — N183 Chronic kidney disease, stage 3 unspecified: Secondary | ICD-10-CM | POA: Diagnosis not present

## 2019-10-26 DIAGNOSIS — G309 Alzheimer's disease, unspecified: Secondary | ICD-10-CM | POA: Diagnosis not present

## 2019-10-26 DIAGNOSIS — I130 Hypertensive heart and chronic kidney disease with heart failure and stage 1 through stage 4 chronic kidney disease, or unspecified chronic kidney disease: Secondary | ICD-10-CM | POA: Diagnosis not present

## 2019-10-26 DIAGNOSIS — F0280 Dementia in other diseases classified elsewhere without behavioral disturbance: Secondary | ICD-10-CM | POA: Diagnosis not present

## 2019-10-26 DIAGNOSIS — I509 Heart failure, unspecified: Secondary | ICD-10-CM | POA: Diagnosis not present

## 2019-10-26 DIAGNOSIS — R296 Repeated falls: Secondary | ICD-10-CM | POA: Diagnosis not present

## 2019-10-27 ENCOUNTER — Non-Acute Institutional Stay (SKILLED_NURSING_FACILITY): Payer: Medicare Other | Admitting: Internal Medicine

## 2019-10-27 ENCOUNTER — Encounter: Payer: Self-pay | Admitting: Internal Medicine

## 2019-10-27 DIAGNOSIS — F0150 Vascular dementia without behavioral disturbance: Secondary | ICD-10-CM | POA: Diagnosis not present

## 2019-10-27 DIAGNOSIS — W19XXXA Unspecified fall, initial encounter: Secondary | ICD-10-CM | POA: Diagnosis not present

## 2019-10-27 DIAGNOSIS — G309 Alzheimer's disease, unspecified: Secondary | ICD-10-CM | POA: Diagnosis not present

## 2019-10-27 DIAGNOSIS — F0280 Dementia in other diseases classified elsewhere without behavioral disturbance: Secondary | ICD-10-CM | POA: Diagnosis not present

## 2019-10-27 DIAGNOSIS — G250 Essential tremor: Secondary | ICD-10-CM

## 2019-10-27 DIAGNOSIS — I482 Chronic atrial fibrillation, unspecified: Secondary | ICD-10-CM

## 2019-10-27 DIAGNOSIS — S0101XA Laceration without foreign body of scalp, initial encounter: Secondary | ICD-10-CM

## 2019-10-27 NOTE — Progress Notes (Signed)
Location:   [facility] Room Number: 31 Place of Service:  SNF [phone]) Provider:  [name], MD  [name], MD  Patient Care Team: [name], MD as PCP - General (Internal Medicine) Nahser, Wonda Cheng, MD as PCP - Cardiology (Cardiology) Mast, Man X, NP as Nurse Practitioner (Internal Medicine)  Extended Emergency Contact Information Primary Emergency Contact: Morgan,Tom Address: [address]          [place], [place] [address] Johnnette Litter of [place] Phone: [phone] Relation: Friend Secondary Emergency Contact: [place] of [place] Phone: [phone] Relation: Niece  Code Status:  DNR Goals of care: Advanced Directive information Advanced Directives 10/27/2019  Does Patient Have a Medical Advance Directive? Yes  Type of Advance Directive Out of facility DNR (pink MOST or yellow form);Healthcare Power of Attorney  Does patient want to make changes to medical advance directive? No - Patient declined  Copy of [place] in Chart? Yes - validated most recent copy scanned in chart (See row information)  Would patient like information on creating a medical advance directive? -  Pre-existing out of facility DNR order (yellow form or pink MOST form) Yellow form placed in chart (order not valid for inpatient use)     Chief Complaint  Patient presents with  . Acute Visit    Fall with injury    HPI:  Pt is a 86 y.o. female seen today for an acute visit for unwitnessed fall with Head Laceration  Patient has h/o Atrial Fibrillation , LE edema, Hypertension, Dysphagia,Essential Tremor, Anemia, Dementia,and depression, Falls Patient is a long-term resident  Has end-stage dementia and history of recurrent falls She had an unwitnessed fall in her room yesterday.  She also sustained a small laceration on her head.  Patient unable to give any history due to her dementia.  She does walk with  a walker but is always unsteady.  She would not let me examine her properly.  Pain it seems like she had stopped bleeding.  Patient is already up this morning and has started walking again. No other acute issues  Past Medical History:  Diagnosis Date  . Anticoagulant long-term use   . Atrial fibrillation ([place])   . CHF (congestive heart failure) ([place])   . Chronic anticoagulation   . Hypertension   . Raynaud phenomenon   . Tremor, essential    Past Surgical History:  Procedure Laterality Date  . CHOLECYSTECTOMY    . TONSILLECTOMY      Allergies  Allergen Reactions  . Fosamax [Alendronate]   . Other Nausea And Vomiting    Green pepper    Allergies as of 10/27/2019      Reactions   Fosamax [alendronate]    Other Nausea And Vomiting   Green pepper      Medication List       Accurate as of October 27, 2019 10:52 AM. If you have any questions, ask your nurse or doctor.        acetaminophen 500 MG tablet Commonly known as: TYLENOL Take 500 mg by mouth every 8 (eight) hours as needed for mild pain or moderate pain.   acetaminophen 500 MG tablet Commonly known as: TYLENOL Take 1,000 mg by mouth 2 (two) times daily. Not to exceed 3,00 mg daily.   Artificial Tears 0.2-0.2-1 % Soln Generic drug: Glycerin-Hypromellose-PEG 400 Place 1 drop into both eyes 3 (three) times daily.   Caltrate 600+D  Plus Minerals 600-800 MG-UNIT Chew Chew 1 tablet by mouth 2 (two) times a day.   mirtazapine 15 MG tablet Commonly known as: REMERON Take 15 mg by mouth at bedtime.   NON FORMULARY Regular, Nectar Thick, Mech Soft Special Instructions: Regular diet with Nectar thick liquids by straw. 2 handled cup and divided plate with built-up utensils for meals.   propranolol ER 80 MG 24 hr capsule Commonly known as: INDERAL LA TAKE 1 CAPSULE BY MOUTH  DAILY   QUEtiapine 25 MG tablet Commonly known as: SEROQUEL Take 12.5 mg by mouth at bedtime as needed.   RESOURCE 2.0 PO Take 120 mLs by  mouth 2 (two) times daily. MORNING 07:00 AM - 10:00 AM, EVENING 07:00 PM - 10:00 PM   sennosides-docusate sodium 8.6-50 MG tablet Commonly known as: SENOKOT-S Take 2 tablets by mouth at bedtime.       Review of Systems  Unable to perform ROS: Dementia    Immunization History  Administered Date(s) Administered  . Influenza Whole 05/09/2018  . Influenza, High Dose Seasonal PF 05/09/2019  . Influenza-Unspecified 05/28/2017  . Moderna SARS-COVID-2 Vaccination 08/09/2019, 09/06/2019  . Pneumococcal Conjugate-13 07/13/2017  . Pneumococcal Polysaccharide-23 04/07/2002  . Tdap 07/13/2017, 10/08/2017   Pertinent  Health Maintenance Due  Topic Date Due  . INFLUENZA VACCINE  Completed  . DEXA SCAN  Completed  . PNA vac Low Risk Adult  Completed   Fall Risk  03/10/2019 10/05/2017  Falls in the past year? (No Data) No  Comment Emmi Telephone Survey: data to providers prior to load -  Number falls in past yr: (No Data) -  Comment Emmi Telephone Survey Actual Response =  -   Functional Status Survey:    Vitals:   10/27/19 1035  BP: 130/70  Pulse: 84  Resp: 20  Temp: (!) 97.5 F (36.4 C)  SpO2: 94%  Weight: 97 lb 6.4 oz (44.2 kg)  Height: 5\' 4"  (1.626 m)   Body mass index is 16.72 kg/m. Physical Exam Vitals reviewed.  HENT:     Head: Normocephalic.     Nose: Nose normal.     Mouth/Throat:     Mouth: Mucous membranes are moist.     Pharynx: Oropharynx is clear.  Eyes:     Pupils: Pupils are equal, round, and reactive to light.  Cardiovascular:     Rate and Rhythm: Normal rate. Rhythm irregular.     Pulses: Normal pulses.  Pulmonary:     Effort: Pulmonary effort is normal.     Breath sounds: Normal breath sounds.  Abdominal:     General: Abdomen is flat. Bowel sounds are normal.     Palpations: Abdomen is soft.  Musculoskeletal:        General: No swelling.     Cervical back: Neck supple.  Skin:    General: Skin is warm.     Comments: Has small Laceration on her  Head but not Bleeding  Neurological:     General: No focal deficit present.     Mental Status: She is alert.  Psychiatric:        Mood and Affect: Mood normal.     Labs reviewed: Recent Labs    02/13/19 0000 02/13/19 0000 03/13/19 0000 05/13/19 0000 05/21/19 0000  NA 147   < > 144 142 144  K 4.3   < > 3.9 3.8 3.7  CL 105  --   --  102 103  CO2 35  --   --  33 33  BUN 43*   < > 28* 28* 26*  CREATININE 1.4*   < > 1.3* 1.1 1.1  CALCIUM 10.9  --   --  9.5 9.9   < > = values in this interval not displayed.   Recent Labs    11/26/18 0000 05/13/19 0000 05/21/19 0000  AST 15 14 13   ALT 6* 6* 5*  ALKPHOS 38 43 41  PROT  --  6.3* 6.1*  ALBUMIN  --  3.8 3.7   Recent Labs    11/26/18 0000 05/13/19 0000 05/21/19 0000  WBC 7.8 8.1 9.1  NEUTROABS  --   --  5,651  HGB 13.0 12.5 12.0  HCT 38 37 36  PLT 160 204 183   Lab Results  Component Value Date   TSH 0.76 05/21/2019   No results found for: HGBA1C No results found for: CHOL, HDL, LDLCALC, LDLDIRECT, TRIG, CHOLHDL  Significant Diagnostic Results in last 30 days:  No results found.  Assessment/Plan Laceration of scalp  Remove the Strips Use Vaseline PRN Fall, initial encounter Continue to be issue with her due to Poor safety Awareness  Chronic a-fib (HCC) Taken off Eliquis due to falls  Mixed Alzheimer's and vascular dementia (HCC) Has not tolerated any meds Weigh has been stable on Remeron Nurses have to use Seroquel PRN especially at night Tremor, essential Will reduce Inderal to 60 mg     Family/ staff Communication:   Labs/tests ordered:  CBC,CMP

## 2019-10-28 DIAGNOSIS — I482 Chronic atrial fibrillation, unspecified: Secondary | ICD-10-CM | POA: Diagnosis not present

## 2019-10-28 LAB — COMPREHENSIVE METABOLIC PANEL
Albumin: 3.5 (ref 3.5–5.0)
Calcium: 9.9 (ref 8.7–10.7)
Globulin: 2.5

## 2019-10-28 LAB — CBC AND DIFFERENTIAL
HCT: 34 — AB (ref 36–46)
Hemoglobin: 11.4 — AB (ref 12.0–16.0)
Platelets: 186 (ref 150–399)
WBC: 8.1

## 2019-10-28 LAB — HEPATIC FUNCTION PANEL
ALT: 6 — AB (ref 7–35)
AST: 15 (ref 13–35)
Alkaline Phosphatase: 43 (ref 25–125)
Bilirubin, Total: 0.5

## 2019-10-28 LAB — CBC: RBC: 3.6 — AB (ref 3.87–5.11)

## 2019-10-28 LAB — BASIC METABOLIC PANEL
BUN: 29 — AB (ref 4–21)
CO2: 33 — AB (ref 13–22)
Chloride: 105 (ref 99–108)
Creatinine: 1.1 (ref 0.5–1.1)
Glucose: 89
Potassium: 4.9 (ref 3.4–5.3)
Sodium: 144 (ref 137–147)

## 2019-10-29 DIAGNOSIS — R296 Repeated falls: Secondary | ICD-10-CM | POA: Diagnosis not present

## 2019-10-29 DIAGNOSIS — I509 Heart failure, unspecified: Secondary | ICD-10-CM | POA: Diagnosis not present

## 2019-10-29 DIAGNOSIS — I130 Hypertensive heart and chronic kidney disease with heart failure and stage 1 through stage 4 chronic kidney disease, or unspecified chronic kidney disease: Secondary | ICD-10-CM | POA: Diagnosis not present

## 2019-10-29 DIAGNOSIS — N183 Chronic kidney disease, stage 3 unspecified: Secondary | ICD-10-CM | POA: Diagnosis not present

## 2019-10-29 DIAGNOSIS — G309 Alzheimer's disease, unspecified: Secondary | ICD-10-CM | POA: Diagnosis not present

## 2019-10-29 DIAGNOSIS — F0280 Dementia in other diseases classified elsewhere without behavioral disturbance: Secondary | ICD-10-CM | POA: Diagnosis not present

## 2019-10-30 ENCOUNTER — Non-Acute Institutional Stay (SKILLED_NURSING_FACILITY): Payer: Medicare Other | Admitting: Nurse Practitioner

## 2019-10-30 ENCOUNTER — Encounter: Payer: Self-pay | Admitting: Nurse Practitioner

## 2019-10-30 DIAGNOSIS — F0280 Dementia in other diseases classified elsewhere without behavioral disturbance: Secondary | ICD-10-CM

## 2019-10-30 DIAGNOSIS — F339 Major depressive disorder, recurrent, unspecified: Secondary | ICD-10-CM

## 2019-10-30 DIAGNOSIS — M545 Low back pain, unspecified: Secondary | ICD-10-CM

## 2019-10-30 DIAGNOSIS — F0150 Vascular dementia without behavioral disturbance: Secondary | ICD-10-CM | POA: Diagnosis not present

## 2019-10-30 DIAGNOSIS — N183 Chronic kidney disease, stage 3 unspecified: Secondary | ICD-10-CM | POA: Diagnosis not present

## 2019-10-30 DIAGNOSIS — G309 Alzheimer's disease, unspecified: Secondary | ICD-10-CM

## 2019-10-30 DIAGNOSIS — G250 Essential tremor: Secondary | ICD-10-CM | POA: Diagnosis not present

## 2019-10-30 DIAGNOSIS — R269 Unspecified abnormalities of gait and mobility: Secondary | ICD-10-CM

## 2019-10-30 DIAGNOSIS — I130 Hypertensive heart and chronic kidney disease with heart failure and stage 1 through stage 4 chronic kidney disease, or unspecified chronic kidney disease: Secondary | ICD-10-CM | POA: Diagnosis not present

## 2019-10-30 DIAGNOSIS — R296 Repeated falls: Secondary | ICD-10-CM

## 2019-10-30 DIAGNOSIS — I509 Heart failure, unspecified: Secondary | ICD-10-CM | POA: Diagnosis not present

## 2019-10-30 NOTE — Progress Notes (Signed)
Location:   [facility] Room Number: 31 Place of Service:  SNF [id number]) Provider: Arna Snipe Arjun Hard NP  Mahlon Gammon, MD  Patient Care Team: Mahlon Gammon, MD as PCP - General (Internal Medicine) Nahser, Deloris Ping, MD as PCP - Cardiology (Cardiology) Avary Eichenberger X, NP as Nurse Practitioner (Internal Medicine)  Extended Emergency Contact Information Primary Emergency Contact: Morgan,Tom Address: [address]          [place], [place] [address] D[place] of [place] Home Phone: [phone] Relation: Friend Secondary Emergency Contact: Brandy Hale [place] of [place] Home Phone: [phone] Relation: Niece  Code Status: DNR Goals of care: Advanced Directive information Advanced Directives 10/30/2019  Does Patient Have a Medical Advance Directive? Yes  Type of Estate agent of [place];Out of facility DNR (pink MOST or yellow form)  Does patient want to make changes to medical advance directive? No - Patient declined  Copy of Healthcare Power of Attorney in Chart? Yes - validated most recent copy scanned in chart (See row information)  Would patient like information on creating a medical advance directive? -  Pre-existing out of facility DNR order (yellow form or pink MOST form) Yellow form placed in chart (order not valid for inpatient use)     Chief Complaint  Patient presents with  . Acute Visit    Fall    HPI:  Pt is a 86 y.o. female seen today for an acute visit for reported fall 10/29/19 when the patient was found lying on the floor in room in front of recliner, responsive, anxious, no apparent injury.  Hx of dementia, resides in SNF [facility] for safety, care assistance, ambulates with walker with SBA, her goal of care is comfort measures, under Hospice service, her mood is stabilized on Mirtazapine 15mg  qd, Quetiapine 12.5mg  prn. Chronic lower back pain, stable, on Tylenol 1000mg  bid. Essential tremor, in fingers, on  Propranolol 80mg  qd.  Past Medical History:  Diagnosis Date  . Anticoagulant long-term use   . Atrial fibrillation (HCC)   . CHF (congestive heart failure) (HCC)   . Chronic anticoagulation   . Hypertension   . Raynaud phenomenon   . Tremor, essential    Past Surgical History:  Procedure Laterality Date  . CHOLECYSTECTOMY    . TONSILLECTOMY      Allergies  Allergen Reactions  . Fosamax [Alendronate]   . Other Nausea And Vomiting    Green pepper    Allergies as of 10/30/2019      Reactions   Fosamax [alendronate]    Other Nausea And Vomiting   Green pepper      Medication List       Accurate as of October 30, 2019  3:11 PM. If you have any questions, ask your nurse or doctor.        acetaminophen 500 MG tablet Commonly known as: TYLENOL Take 500 mg by mouth every 8 (eight) hours as needed for mild pain or moderate pain.   acetaminophen 500 MG tablet Commonly known as: TYLENOL Take 1,000 mg by mouth 2 (two) times daily. Not to exceed 3,00 mg daily.   Artificial Tears 0.2-0.2-1 % Soln Generic drug: Glycerin-Hypromellose-PEG 400 Place 1 drop into both eyes 3 (three) times daily.   Caltrate 600+D Plus Minerals 600-800 MG-UNIT Chew Chew 1 tablet by mouth 2 (two) times a day.   mirtazapine 15 MG tablet Commonly known as: REMERON Take 15 mg by mouth at bedtime.   NON  FORMULARY Regular, Nectar Thick, Mech Soft Special Instructions: Regular diet with Nectar thick liquids by straw. 2 handled cup and divided plate with built-up utensils for meals.   propranolol ER 80 MG 24 hr capsule Commonly known as: INDERAL LA TAKE 1 CAPSULE BY MOUTH  DAILY   QUEtiapine 25 MG tablet Commonly known as: SEROQUEL Take 12.5 mg by mouth at bedtime as needed.   RESOURCE 2.0 PO Take 120 mLs by mouth 2 (two) times daily. MORNING 07:00 AM - 10:00 AM, EVENING 07:00 PM - 10:00 PM   sennosides-docusate sodium 8.6-50 MG tablet Commonly known as: SENOKOT-S Take 2 tablets by mouth at  bedtime.      ROS was provided with assistance of staff.  Review of Systems  Constitutional: Negative for activity change, appetite change, fatigue and fever.  HENT: Positive for hearing loss. Negative for congestion and voice change.   Eyes: Negative for visual disturbance.  Respiratory: Negative for shortness of breath.   Cardiovascular: Negative for leg swelling.  Gastrointestinal: Negative for abdominal pain, constipation, diarrhea, nausea and vomiting.  Genitourinary: Negative for difficulty urinating, dysuria and urgency.  Musculoskeletal: Positive for arthralgias, back pain and gait problem.  Skin: Negative for color change.       Scalp abrasion from fall on 10/26/18  Neurological: Positive for tremors. Negative for facial asymmetry, speech difficulty and light-headedness.       Dementia  Psychiatric/Behavioral: Positive for confusion. Negative for agitation, behavioral problems and sleep disturbance. The patient is not nervous/anxious.     Immunization History  Administered Date(s) Administered  . Influenza Whole 05/09/2018  . Influenza, High Dose Seasonal PF 05/09/2019  . Influenza-Unspecified 05/28/2017  . Moderna SARS-COVID-2 Vaccination 08/09/2019, 09/06/2019  . Pneumococcal Conjugate-13 07/13/2017  . Pneumococcal Polysaccharide-23 04/07/2002  . Tdap 07/13/2017, 10/08/2017   Pertinent  Health Maintenance Due  Topic Date Due  . INFLUENZA VACCINE  Completed  . DEXA SCAN  Completed  . PNA vac Low Risk Adult  Completed   Fall Risk  03/10/2019 10/05/2017  Falls in the past year? (No Data) No  Comment Emmi Telephone Survey: data to providers prior to load -  Number falls in past yr: (No Data) -  Comment Emmi Telephone Survey Actual Response =  -   Functional Status Survey:    Vitals:   10/30/19 1054  BP: 110/60  Pulse: 65  Resp: 18  Temp: (!) 96.9 F (36.1 C)  SpO2: 92%  Weight: 97 lb 6.4 oz (44.2 kg)  Height: 5\' 4"  (1.626 m)   Body mass index is 16.72  kg/m. Physical Exam Vitals and nursing note reviewed.  Constitutional:      General: She is not in acute distress.    Appearance: Normal appearance. She is not ill-appearing.     Comments: Low body weight, but stable.   HENT:     Head: Normocephalic and atraumatic.     Mouth/Throat:     Mouth: Mucous membranes are moist.  Eyes:     Extraocular Movements: Extraocular movements intact.     Conjunctiva/sclera: Conjunctivae normal.     Pupils: Pupils are equal, round, and reactive to light.  Cardiovascular:     Rate and Rhythm: Normal rate. Rhythm irregular.     Heart sounds: No murmur.  Pulmonary:     Breath sounds: No rales.  Abdominal:     General: Bowel sounds are normal. There is no distension.     Palpations: Abdomen is soft.     Tenderness: There is no  abdominal tenderness.  Musculoskeletal:     Cervical back: Normal range of motion and neck supple.     Right lower leg: No edema.     Left lower leg: No edema.  Skin:    General: Skin is warm and dry.     Comments: A small abrasion on scalp from fall 10/26/19, no active bleeding or s/s of infection.   Neurological:     General: No focal deficit present.     Mental Status: She is alert. Mental status is at baseline.     Motor: No weakness.     Coordination: Coordination abnormal.     Gait: Gait abnormal.     Comments: Tremor in fingers. Oriented to self.   Psychiatric:     Comments: Confused.      Labs reviewed: Recent Labs    02/13/19 0000 02/13/19 0000 03/13/19 0000 05/13/19 0000 05/21/19 0000  NA 147   < > 144 142 144  K 4.3   < > 3.9 3.8 3.7  CL 105  --   --  102 103  CO2 35  --   --  33 33  BUN 43*   < > 28* 28* 26*  CREATININE 1.4*   < > 1.3* 1.1 1.1  CALCIUM 10.9  --   --  9.5 9.9   < > = values in this interval not displayed.   Recent Labs    11/26/18 0000 05/13/19 0000 05/21/19 0000  AST 15 14 13   ALT 6* 6* 5*  ALKPHOS 38 43 41  PROT  --  6.3* 6.1*  ALBUMIN  --  3.8 3.7   Recent Labs     11/26/18 0000 05/13/19 0000 05/21/19 0000  WBC 7.8 8.1 9.1  NEUTROABS  --   --  5,651  HGB 13.0 12.5 12.0  HCT 38 37 36  PLT 160 204 183   Lab Results  Component Value Date   TSH 0.76 05/21/2019   No results found for: HGBA1C No results found for: CHOL, HDL, LDLCALC, LDLDIRECT, TRIG, CHOLHDL  Significant Diagnostic Results in last 30 days:  No results found.  Assessment/Plan: Frequent falls Reported fall 10/29/19 when the patient was found lying on the floor in room in front of recliner, responsive, anxious, no apparent injury. Lack of safety awareness, frailty, unsteady gait are contributory, close supervision/assistance needed.   Depression, recurrent (HCC) He mood is managed, continue MIrtazapine, prn Quetiapine.   Gait abnormality Needs supervision SBA for ambulation with walker.   Lower back pain Stable, evaluated by Ortho 05/02/18, continue Tylenol.   Mixed Alzheimer's and vascular dementia (HCC) Advanced, comfort measures, under hospice service, close supervision/assistance for safety.   Tremor, essential Tremor seen in fingers/hands, intentional. Continue Propranolol.     Family/ staff Communication: plan of care reviewed with the patient and charge nurse.   Labs/tests ordered:  None  Time spend 25 minutes.

## 2019-10-30 NOTE — Assessment & Plan Note (Signed)
He mood is managed, continue MIrtazapine, prn Quetiapine.

## 2019-10-30 NOTE — Assessment & Plan Note (Signed)
Needs supervision SBA for ambulation with walker.

## 2019-10-30 NOTE — Assessment & Plan Note (Signed)
Reported fall 10/29/19 when the patient was found lying on the floor in room in front of recliner, responsive, anxious, no apparent injury. Lack of safety awareness, frailty, unsteady gait are contributory, close supervision/assistance needed.

## 2019-10-30 NOTE — Assessment & Plan Note (Signed)
Stable, evaluated by Ortho 05/02/18, continue Tylenol.

## 2019-10-30 NOTE — Assessment & Plan Note (Signed)
Advanced, comfort measures, under hospice service, close supervision/assistance for safety.

## 2019-10-30 NOTE — Assessment & Plan Note (Signed)
Tremor seen in fingers/hands, intentional. Continue Propranolol.

## 2019-10-31 DIAGNOSIS — I509 Heart failure, unspecified: Secondary | ICD-10-CM | POA: Diagnosis not present

## 2019-10-31 DIAGNOSIS — R296 Repeated falls: Secondary | ICD-10-CM | POA: Diagnosis not present

## 2019-10-31 DIAGNOSIS — G309 Alzheimer's disease, unspecified: Secondary | ICD-10-CM | POA: Diagnosis not present

## 2019-10-31 DIAGNOSIS — F0280 Dementia in other diseases classified elsewhere without behavioral disturbance: Secondary | ICD-10-CM | POA: Diagnosis not present

## 2019-10-31 DIAGNOSIS — N183 Chronic kidney disease, stage 3 unspecified: Secondary | ICD-10-CM | POA: Diagnosis not present

## 2019-10-31 DIAGNOSIS — I130 Hypertensive heart and chronic kidney disease with heart failure and stage 1 through stage 4 chronic kidney disease, or unspecified chronic kidney disease: Secondary | ICD-10-CM | POA: Diagnosis not present

## 2019-11-02 DIAGNOSIS — G309 Alzheimer's disease, unspecified: Secondary | ICD-10-CM | POA: Diagnosis not present

## 2019-11-02 DIAGNOSIS — R296 Repeated falls: Secondary | ICD-10-CM | POA: Diagnosis not present

## 2019-11-02 DIAGNOSIS — N183 Chronic kidney disease, stage 3 unspecified: Secondary | ICD-10-CM | POA: Diagnosis not present

## 2019-11-02 DIAGNOSIS — F0280 Dementia in other diseases classified elsewhere without behavioral disturbance: Secondary | ICD-10-CM | POA: Diagnosis not present

## 2019-11-02 DIAGNOSIS — I509 Heart failure, unspecified: Secondary | ICD-10-CM | POA: Diagnosis not present

## 2019-11-02 DIAGNOSIS — I130 Hypertensive heart and chronic kidney disease with heart failure and stage 1 through stage 4 chronic kidney disease, or unspecified chronic kidney disease: Secondary | ICD-10-CM | POA: Diagnosis not present

## 2019-11-05 DIAGNOSIS — R296 Repeated falls: Secondary | ICD-10-CM | POA: Diagnosis not present

## 2019-11-05 DIAGNOSIS — F0280 Dementia in other diseases classified elsewhere without behavioral disturbance: Secondary | ICD-10-CM | POA: Diagnosis not present

## 2019-11-05 DIAGNOSIS — I509 Heart failure, unspecified: Secondary | ICD-10-CM | POA: Diagnosis not present

## 2019-11-05 DIAGNOSIS — I130 Hypertensive heart and chronic kidney disease with heart failure and stage 1 through stage 4 chronic kidney disease, or unspecified chronic kidney disease: Secondary | ICD-10-CM | POA: Diagnosis not present

## 2019-11-05 DIAGNOSIS — N183 Chronic kidney disease, stage 3 unspecified: Secondary | ICD-10-CM | POA: Diagnosis not present

## 2019-11-05 DIAGNOSIS — G309 Alzheimer's disease, unspecified: Secondary | ICD-10-CM | POA: Diagnosis not present

## 2019-11-06 DIAGNOSIS — I130 Hypertensive heart and chronic kidney disease with heart failure and stage 1 through stage 4 chronic kidney disease, or unspecified chronic kidney disease: Secondary | ICD-10-CM | POA: Diagnosis not present

## 2019-11-06 DIAGNOSIS — F418 Other specified anxiety disorders: Secondary | ICD-10-CM | POA: Diagnosis not present

## 2019-11-06 DIAGNOSIS — G250 Essential tremor: Secondary | ICD-10-CM | POA: Diagnosis not present

## 2019-11-06 DIAGNOSIS — R159 Full incontinence of feces: Secondary | ICD-10-CM | POA: Diagnosis not present

## 2019-11-06 DIAGNOSIS — Z681 Body mass index (BMI) 19 or less, adult: Secondary | ICD-10-CM | POA: Diagnosis not present

## 2019-11-06 DIAGNOSIS — R32 Unspecified urinary incontinence: Secondary | ICD-10-CM | POA: Diagnosis not present

## 2019-11-06 DIAGNOSIS — S22080D Wedge compression fracture of T11-T12 vertebra, subsequent encounter for fracture with routine healing: Secondary | ICD-10-CM | POA: Diagnosis not present

## 2019-11-06 DIAGNOSIS — H01003 Unspecified blepharitis right eye, unspecified eyelid: Secondary | ICD-10-CM | POA: Diagnosis not present

## 2019-11-06 DIAGNOSIS — D631 Anemia in chronic kidney disease: Secondary | ICD-10-CM | POA: Diagnosis not present

## 2019-11-06 DIAGNOSIS — Z87440 Personal history of urinary (tract) infections: Secondary | ICD-10-CM | POA: Diagnosis not present

## 2019-11-06 DIAGNOSIS — N183 Chronic kidney disease, stage 3 unspecified: Secondary | ICD-10-CM | POA: Diagnosis not present

## 2019-11-06 DIAGNOSIS — I509 Heart failure, unspecified: Secondary | ICD-10-CM | POA: Diagnosis not present

## 2019-11-06 DIAGNOSIS — E46 Unspecified protein-calorie malnutrition: Secondary | ICD-10-CM | POA: Diagnosis not present

## 2019-11-06 DIAGNOSIS — I7300 Raynaud's syndrome without gangrene: Secondary | ICD-10-CM | POA: Diagnosis not present

## 2019-11-06 DIAGNOSIS — F0280 Dementia in other diseases classified elsewhere without behavioral disturbance: Secondary | ICD-10-CM | POA: Diagnosis not present

## 2019-11-06 DIAGNOSIS — G309 Alzheimer's disease, unspecified: Secondary | ICD-10-CM | POA: Diagnosis not present

## 2019-11-06 DIAGNOSIS — I4891 Unspecified atrial fibrillation: Secondary | ICD-10-CM | POA: Diagnosis not present

## 2019-11-06 DIAGNOSIS — Z947 Corneal transplant status: Secondary | ICD-10-CM | POA: Diagnosis not present

## 2019-11-06 DIAGNOSIS — Z741 Need for assistance with personal care: Secondary | ICD-10-CM | POA: Diagnosis not present

## 2019-11-06 DIAGNOSIS — R296 Repeated falls: Secondary | ICD-10-CM | POA: Diagnosis not present

## 2019-11-08 DIAGNOSIS — N183 Chronic kidney disease, stage 3 unspecified: Secondary | ICD-10-CM | POA: Diagnosis not present

## 2019-11-08 DIAGNOSIS — I130 Hypertensive heart and chronic kidney disease with heart failure and stage 1 through stage 4 chronic kidney disease, or unspecified chronic kidney disease: Secondary | ICD-10-CM | POA: Diagnosis not present

## 2019-11-08 DIAGNOSIS — F0280 Dementia in other diseases classified elsewhere without behavioral disturbance: Secondary | ICD-10-CM | POA: Diagnosis not present

## 2019-11-08 DIAGNOSIS — I509 Heart failure, unspecified: Secondary | ICD-10-CM | POA: Diagnosis not present

## 2019-11-08 DIAGNOSIS — R296 Repeated falls: Secondary | ICD-10-CM | POA: Diagnosis not present

## 2019-11-08 DIAGNOSIS — G309 Alzheimer's disease, unspecified: Secondary | ICD-10-CM | POA: Diagnosis not present

## 2019-11-09 DIAGNOSIS — I509 Heart failure, unspecified: Secondary | ICD-10-CM | POA: Diagnosis not present

## 2019-11-09 DIAGNOSIS — I130 Hypertensive heart and chronic kidney disease with heart failure and stage 1 through stage 4 chronic kidney disease, or unspecified chronic kidney disease: Secondary | ICD-10-CM | POA: Diagnosis not present

## 2019-11-09 DIAGNOSIS — R296 Repeated falls: Secondary | ICD-10-CM | POA: Diagnosis not present

## 2019-11-09 DIAGNOSIS — F0280 Dementia in other diseases classified elsewhere without behavioral disturbance: Secondary | ICD-10-CM | POA: Diagnosis not present

## 2019-11-09 DIAGNOSIS — G309 Alzheimer's disease, unspecified: Secondary | ICD-10-CM | POA: Diagnosis not present

## 2019-11-09 DIAGNOSIS — N183 Chronic kidney disease, stage 3 unspecified: Secondary | ICD-10-CM | POA: Diagnosis not present

## 2019-11-13 DIAGNOSIS — F0280 Dementia in other diseases classified elsewhere without behavioral disturbance: Secondary | ICD-10-CM | POA: Diagnosis not present

## 2019-11-13 DIAGNOSIS — G309 Alzheimer's disease, unspecified: Secondary | ICD-10-CM | POA: Diagnosis not present

## 2019-11-13 DIAGNOSIS — R296 Repeated falls: Secondary | ICD-10-CM | POA: Diagnosis not present

## 2019-11-13 DIAGNOSIS — I130 Hypertensive heart and chronic kidney disease with heart failure and stage 1 through stage 4 chronic kidney disease, or unspecified chronic kidney disease: Secondary | ICD-10-CM | POA: Diagnosis not present

## 2019-11-13 DIAGNOSIS — I509 Heart failure, unspecified: Secondary | ICD-10-CM | POA: Diagnosis not present

## 2019-11-13 DIAGNOSIS — N183 Chronic kidney disease, stage 3 unspecified: Secondary | ICD-10-CM | POA: Diagnosis not present

## 2019-11-16 DIAGNOSIS — I509 Heart failure, unspecified: Secondary | ICD-10-CM | POA: Diagnosis not present

## 2019-11-16 DIAGNOSIS — F0280 Dementia in other diseases classified elsewhere without behavioral disturbance: Secondary | ICD-10-CM | POA: Diagnosis not present

## 2019-11-16 DIAGNOSIS — G309 Alzheimer's disease, unspecified: Secondary | ICD-10-CM | POA: Diagnosis not present

## 2019-11-16 DIAGNOSIS — I130 Hypertensive heart and chronic kidney disease with heart failure and stage 1 through stage 4 chronic kidney disease, or unspecified chronic kidney disease: Secondary | ICD-10-CM | POA: Diagnosis not present

## 2019-11-16 DIAGNOSIS — N183 Chronic kidney disease, stage 3 unspecified: Secondary | ICD-10-CM | POA: Diagnosis not present

## 2019-11-16 DIAGNOSIS — R296 Repeated falls: Secondary | ICD-10-CM | POA: Diagnosis not present

## 2019-11-19 DIAGNOSIS — G309 Alzheimer's disease, unspecified: Secondary | ICD-10-CM | POA: Diagnosis not present

## 2019-11-19 DIAGNOSIS — N183 Chronic kidney disease, stage 3 unspecified: Secondary | ICD-10-CM | POA: Diagnosis not present

## 2019-11-19 DIAGNOSIS — R296 Repeated falls: Secondary | ICD-10-CM | POA: Diagnosis not present

## 2019-11-19 DIAGNOSIS — I130 Hypertensive heart and chronic kidney disease with heart failure and stage 1 through stage 4 chronic kidney disease, or unspecified chronic kidney disease: Secondary | ICD-10-CM | POA: Diagnosis not present

## 2019-11-19 DIAGNOSIS — I509 Heart failure, unspecified: Secondary | ICD-10-CM | POA: Diagnosis not present

## 2019-11-19 DIAGNOSIS — F0280 Dementia in other diseases classified elsewhere without behavioral disturbance: Secondary | ICD-10-CM | POA: Diagnosis not present

## 2019-11-20 ENCOUNTER — Non-Acute Institutional Stay (SKILLED_NURSING_FACILITY): Payer: Medicare Other | Admitting: Nurse Practitioner

## 2019-11-20 ENCOUNTER — Encounter: Payer: Self-pay | Admitting: Nurse Practitioner

## 2019-11-20 DIAGNOSIS — I482 Chronic atrial fibrillation, unspecified: Secondary | ICD-10-CM

## 2019-11-20 DIAGNOSIS — F339 Major depressive disorder, recurrent, unspecified: Secondary | ICD-10-CM

## 2019-11-20 DIAGNOSIS — R296 Repeated falls: Secondary | ICD-10-CM | POA: Diagnosis not present

## 2019-11-20 DIAGNOSIS — I130 Hypertensive heart and chronic kidney disease with heart failure and stage 1 through stage 4 chronic kidney disease, or unspecified chronic kidney disease: Secondary | ICD-10-CM | POA: Diagnosis not present

## 2019-11-20 DIAGNOSIS — N183 Chronic kidney disease, stage 3 unspecified: Secondary | ICD-10-CM | POA: Diagnosis not present

## 2019-11-20 DIAGNOSIS — F0280 Dementia in other diseases classified elsewhere without behavioral disturbance: Secondary | ICD-10-CM | POA: Diagnosis not present

## 2019-11-20 DIAGNOSIS — M545 Low back pain, unspecified: Secondary | ICD-10-CM

## 2019-11-20 DIAGNOSIS — R269 Unspecified abnormalities of gait and mobility: Secondary | ICD-10-CM | POA: Diagnosis not present

## 2019-11-20 DIAGNOSIS — G309 Alzheimer's disease, unspecified: Secondary | ICD-10-CM | POA: Diagnosis not present

## 2019-11-20 DIAGNOSIS — G250 Essential tremor: Secondary | ICD-10-CM

## 2019-11-20 DIAGNOSIS — F0150 Vascular dementia without behavioral disturbance: Secondary | ICD-10-CM | POA: Diagnosis not present

## 2019-11-20 DIAGNOSIS — I509 Heart failure, unspecified: Secondary | ICD-10-CM | POA: Diagnosis not present

## 2019-11-20 NOTE — Assessment & Plan Note (Signed)
Chronic, able to walk with walker, continue Tylenol.

## 2019-11-20 NOTE — Assessment & Plan Note (Signed)
Her mood is stable, continue Mirtazapine, prn Seroquel.

## 2019-11-20 NOTE — Assessment & Plan Note (Signed)
Heart rate is in control.  

## 2019-11-20 NOTE — Assessment & Plan Note (Signed)
reported fall 4/14/21when the patient was found lying on the floor in hallway with legs straight walker in front of her, no apparent injury. Her goal of care is comfort measures, close supervision and assistance for safety.

## 2019-11-20 NOTE — Progress Notes (Signed)
Location:   [facility] Room Number: 31 Place of Service:  SNF (31) Provider: Lennie Odor Kilah Drahos NP  [name], MD  Patient Care Team: [name], MD as PCP - General (Internal Medicine) Nahser, Wonda Cheng, MD as PCP - Cardiology (Cardiology) Caysie Minnifield X, NP as Nurse Practitioner (Internal Medicine)  Extended Emergency Contact Information Primary Emergency Contact: Morgan,Tom Address: [address]          [place], [place] [address] Johnnette Litter of [place] Phone: [phone] Relation: Friend Secondary Emergency Contact: [place] of [place] Phone: [phone] Relation: Niece  Code Status: DNR Goals of care: Advanced Directive information Advanced Directives 11/20/2019  Does Patient Have a Medical Advance Directive? Yes  Type of Paramedic of [place];Out of facility DNR (pink MOST or yellow form)  Does patient want to make changes to medical advance directive? No - Patient declined  Copy of [place] in Chart? Yes - validated most recent copy scanned in chart (See row information)  Would patient like information on creating a medical advance directive? -  Pre-existing out of facility DNR order (yellow form or pink MOST form) Yellow form placed in chart (order not valid for inpatient use)     Chief Complaint  Patient presents with  . Acute Visit    Fall    HPI:  Pt is a 86 y.o. female seen today for an acute visit for reported fall 4/14/21when the patient was found lying on the floor in hallway with legs straight walker in front of her, no apparent injury.   The patient resides in SNF [facility] for safety, care assistance, her mood is managed on Quetiapine prn, Mirtazapine 15mg  qd. OA, mostly lower back aches/pains, controlled on Tylenol 1000mg  bid. Essential tremor, stable, on Propranolol. Afib, heart rate is in control.   Past Medical History:  Diagnosis Date  .  Anticoagulant long-term use   . Atrial fibrillation ([place])   . CHF (congestive heart failure) ([place])   . Chronic anticoagulation   . Hypertension   . Raynaud phenomenon   . Tremor, essential    Past Surgical History:  Procedure Laterality Date  . CHOLECYSTECTOMY    . TONSILLECTOMY      Allergies  Allergen Reactions  . Fosamax [Alendronate]   . Other Nausea And Vomiting    Green pepper    Allergies as of 11/20/2019      Reactions   Fosamax [alendronate]    Other Nausea And Vomiting   Green pepper      Medication List       Accurate as of November 20, 2019 11:59 PM. If you have any questions, ask your nurse or doctor.        STOP taking these medications   QUEtiapine 25 MG tablet Commonly known as: SEROQUEL Stopped by: Brylin Stanislawski X Jaleya Pebley, NP     TAKE these medications   acetaminophen 500 MG tablet Commonly known as: TYLENOL Take 500 mg by mouth every 8 (eight) hours as needed for mild pain or moderate pain.   acetaminophen 500 MG tablet Commonly known as: TYLENOL Take 1,000 mg by mouth 2 (two) times daily. Not to exceed 3,00 mg daily.   Artificial Tears 0.2-0.2-1 % Soln Generic drug: Glycerin-Hypromellose-PEG 400 Place 1 drop into both eyes 3 (three) times daily.   Caltrate 600+D Plus Minerals 600-800 MG-UNIT Chew Chew 1 tablet by mouth 2 (two) times a day.   mirtazapine  15 MG tablet Commonly known as: REMERON Take 15 mg by mouth at bedtime.   NON FORMULARY Regular, Nectar Thick, Mech Soft Special Instructions: Regular diet with Nectar thick liquids by straw. 2 handled cup and divided plate with built-up utensils for meals.   propranolol ER 80 MG 24 hr capsule Commonly known as: INDERAL LA TAKE 1 CAPSULE BY MOUTH  DAILY   RESOURCE 2.0 PO Take 120 mLs by mouth 2 (two) times daily. MORNING 07:00 AM - 10:00 AM, EVENING 07:00 PM - 10:00 PM   sennosides-docusate sodium 8.6-50 MG tablet Commonly known as: SENOKOT-S Take 2 tablets by mouth at bedtime.        Review of Systems  Constitutional: Negative for activity change, appetite change and fatigue.  HENT: Positive for hearing loss. Negative for congestion and voice change.   Eyes: Negative for visual disturbance.  Respiratory: Negative for shortness of breath.   Cardiovascular: Negative for leg swelling.  Gastrointestinal: Negative for abdominal pain and constipation.  Genitourinary: Negative for difficulty urinating, dysuria and urgency.  Musculoskeletal: Positive for arthralgias, back pain and gait problem.  Skin: Negative for color change.  Neurological: Positive for tremors. Negative for facial asymmetry, speech difficulty and light-headedness.       Dementia  Psychiatric/Behavioral: Positive for confusion. Negative for agitation and sleep disturbance. The patient is not nervous/anxious.     Immunization History  Administered Date(s) Administered  . Influenza Whole 05/09/2018  . Influenza, High Dose Seasonal PF 05/09/2019  . Influenza-Unspecified 05/28/2017  . Moderna SARS-COVID-2 Vaccination 08/09/2019, 09/06/2019  . Pneumococcal Conjugate-13 07/13/2017  . Pneumococcal Polysaccharide-23 04/07/2002  . Tdap 07/13/2017, 10/08/2017   Pertinent  Health Maintenance Due  Topic Date Due  . INFLUENZA VACCINE  03/07/2020  . DEXA SCAN  Completed  . PNA vac Low Risk Adult  Completed   Fall Risk  03/10/2019 10/05/2017  Falls in the past year? (No Data) No  Comment Emmi Telephone Survey: data to providers prior to load -  Number falls in past yr: (No Data) -  Comment Emmi Telephone Survey Actual Response =  -   Functional Status Survey:    Vitals:   11/20/19 1324  BP: 110/60  Pulse: 80  Resp: 18  Temp: (!) 97 F (36.1 C)  SpO2: 94%  Weight: 98 lb (44.5 kg)  Height: 5\' 4"  (1.626 m)   Body mass index is 16.82 kg/m. Physical Exam Vitals and nursing note reviewed.  Constitutional:      Appearance: Normal appearance.     Comments: Low body weight, but stable.   HENT:     Head:  Normocephalic and atraumatic.     Nose: Nose normal.     Mouth/Throat:     Mouth: Mucous membranes are moist.  Eyes:     Extraocular Movements: Extraocular movements intact.     Conjunctiva/sclera: Conjunctivae normal.     Pupils: Pupils are equal, round, and reactive to light.  Cardiovascular:     Rate and Rhythm: Normal rate. Rhythm irregular.     Heart sounds: No murmur.  Pulmonary:     Breath sounds: No rales.  Abdominal:     General: There is no distension.     Palpations: Abdomen is soft.     Tenderness: There is no abdominal tenderness.  Musculoskeletal:     Cervical back: Normal range of motion and neck supple.     Right lower leg: No edema.     Left lower leg: No edema.  Skin:  General: Skin is warm and dry.  Neurological:     General: No focal deficit present.     Mental Status: She is alert. Mental status is at baseline.     Motor: No weakness.     Coordination: Coordination abnormal.     Gait: Gait abnormal.     Comments: Tremor in fingers. Oriented to self.   Psychiatric:     Comments: Confused.      Labs reviewed: Recent Labs    05/13/19 0000 05/21/19 0000 10/28/19 0000  NA 142 144 144  K 3.8 3.7 4.9  CL 102 103 105  CO2 33 33 33*  BUN 28* 26* 29*  CREATININE 1.1 1.1 1.1  CALCIUM 9.5 9.9 9.9   Recent Labs    05/13/19 0000 05/21/19 0000 10/28/19 0000  AST 14 13 15   ALT 6* 5* 6*  ALKPHOS 43 41 43  PROT 6.3* 6.1*  --   ALBUMIN 3.8 3.7 3.5   Recent Labs    05/13/19 0000 05/21/19 0000 10/28/19 0000  WBC 8.1 9.1 8.1  NEUTROABS  --  5,651  --   HGB 12.5 12.0 11.4*  HCT 37 36 34*  PLT 204 183 186   Lab Results  Component Value Date   TSH 0.76 05/21/2019   No results found for: HGBA1C No results found for: CHOL, HDL, LDLCALC, LDLDIRECT, TRIG, CHOLHDL  Significant Diagnostic Results in last 30 days:  No results found.  Assessment/Plan: Frequent falls  reported fall 4/14/21when the patient was found lying on the floor in hallway  with legs straight walker in front of her, no apparent injury. Her goal of care is comfort measures, close supervision and assistance for safety.   Depression, recurrent (HCC) Her mood is stable, continue Mirtazapine, prn Seroquel.   Lower back pain Chronic, able to walk with walker, continue Tylenol.   Chronic a-fib (HCC) Heart rate is in control.   Mixed Alzheimer's and vascular dementia (HCC) Lack of safety awareness, progressing of frailty, risk for falling, continue close supervision and assistance for safety.   Tremor, essential Chronic, continue Propranolol.   Gait abnormality Frail, impaired balance, continue ambulates with walker with close supervision and assistance for safety    Family/ staff Communication: plan of care reviewed with the patient and charge nurse.   Labs/tests ordered:  None  Time spend 25 minutes.

## 2019-11-20 NOTE — Assessment & Plan Note (Signed)
Lack of safety awareness, progressing of frailty, risk for falling, continue close supervision and assistance for safety.

## 2019-11-20 NOTE — Assessment & Plan Note (Signed)
Chronic, continue Propranolol.

## 2019-11-20 NOTE — Assessment & Plan Note (Signed)
Frail, impaired balance, continue ambulates with walker with close supervision and assistance for safety

## 2019-11-21 ENCOUNTER — Encounter: Payer: Self-pay | Admitting: Nurse Practitioner

## 2019-11-23 DIAGNOSIS — F0280 Dementia in other diseases classified elsewhere without behavioral disturbance: Secondary | ICD-10-CM | POA: Diagnosis not present

## 2019-11-23 DIAGNOSIS — R296 Repeated falls: Secondary | ICD-10-CM | POA: Diagnosis not present

## 2019-11-23 DIAGNOSIS — I509 Heart failure, unspecified: Secondary | ICD-10-CM | POA: Diagnosis not present

## 2019-11-23 DIAGNOSIS — N183 Chronic kidney disease, stage 3 unspecified: Secondary | ICD-10-CM | POA: Diagnosis not present

## 2019-11-23 DIAGNOSIS — I130 Hypertensive heart and chronic kidney disease with heart failure and stage 1 through stage 4 chronic kidney disease, or unspecified chronic kidney disease: Secondary | ICD-10-CM | POA: Diagnosis not present

## 2019-11-23 DIAGNOSIS — G309 Alzheimer's disease, unspecified: Secondary | ICD-10-CM | POA: Diagnosis not present

## 2019-11-27 DIAGNOSIS — I130 Hypertensive heart and chronic kidney disease with heart failure and stage 1 through stage 4 chronic kidney disease, or unspecified chronic kidney disease: Secondary | ICD-10-CM | POA: Diagnosis not present

## 2019-11-27 DIAGNOSIS — F0280 Dementia in other diseases classified elsewhere without behavioral disturbance: Secondary | ICD-10-CM | POA: Diagnosis not present

## 2019-11-27 DIAGNOSIS — I509 Heart failure, unspecified: Secondary | ICD-10-CM | POA: Diagnosis not present

## 2019-11-27 DIAGNOSIS — R296 Repeated falls: Secondary | ICD-10-CM | POA: Diagnosis not present

## 2019-11-27 DIAGNOSIS — N183 Chronic kidney disease, stage 3 unspecified: Secondary | ICD-10-CM | POA: Diagnosis not present

## 2019-11-27 DIAGNOSIS — G309 Alzheimer's disease, unspecified: Secondary | ICD-10-CM | POA: Diagnosis not present

## 2019-11-30 DIAGNOSIS — I509 Heart failure, unspecified: Secondary | ICD-10-CM | POA: Diagnosis not present

## 2019-11-30 DIAGNOSIS — G309 Alzheimer's disease, unspecified: Secondary | ICD-10-CM | POA: Diagnosis not present

## 2019-11-30 DIAGNOSIS — N183 Chronic kidney disease, stage 3 unspecified: Secondary | ICD-10-CM | POA: Diagnosis not present

## 2019-11-30 DIAGNOSIS — R296 Repeated falls: Secondary | ICD-10-CM | POA: Diagnosis not present

## 2019-11-30 DIAGNOSIS — F0280 Dementia in other diseases classified elsewhere without behavioral disturbance: Secondary | ICD-10-CM | POA: Diagnosis not present

## 2019-11-30 DIAGNOSIS — I130 Hypertensive heart and chronic kidney disease with heart failure and stage 1 through stage 4 chronic kidney disease, or unspecified chronic kidney disease: Secondary | ICD-10-CM | POA: Diagnosis not present

## 2019-12-02 ENCOUNTER — Non-Acute Institutional Stay (SKILLED_NURSING_FACILITY): Payer: Medicare Other | Admitting: Internal Medicine

## 2019-12-02 ENCOUNTER — Encounter: Payer: Self-pay | Admitting: Internal Medicine

## 2019-12-02 DIAGNOSIS — G250 Essential tremor: Secondary | ICD-10-CM

## 2019-12-02 DIAGNOSIS — F0280 Dementia in other diseases classified elsewhere without behavioral disturbance: Secondary | ICD-10-CM

## 2019-12-02 DIAGNOSIS — I482 Chronic atrial fibrillation, unspecified: Secondary | ICD-10-CM | POA: Diagnosis not present

## 2019-12-02 DIAGNOSIS — F339 Major depressive disorder, recurrent, unspecified: Secondary | ICD-10-CM | POA: Diagnosis not present

## 2019-12-02 DIAGNOSIS — G309 Alzheimer's disease, unspecified: Secondary | ICD-10-CM

## 2019-12-02 DIAGNOSIS — R296 Repeated falls: Secondary | ICD-10-CM

## 2019-12-02 DIAGNOSIS — F0150 Vascular dementia without behavioral disturbance: Secondary | ICD-10-CM

## 2019-12-02 NOTE — Progress Notes (Signed)
Location:   [facility] Room Number: 31 Place of Service:  SNF [phone]) Provider:  Einar Crow MD   Mahlon Gammon, MD  Patient Care Team: Mahlon Gammon, MD as PCP - General (Internal Medicine) Nahser, Deloris Ping, MD as PCP - Cardiology (Cardiology) Mast, Man X, NP as Nurse Practitioner (Internal Medicine)  Extended Emergency Contact Information Primary Emergency Contact: Morgan,Tom Address: [address]          [place], [place] [address] [place] Home Phone: [phone] Relation: Friend Secondary Emergency Contact: Fletcher,Christa  [place] Home Phone: [phone] Relation: Niece  Code Status:  DNR Goals of care: Advanced Directive information Advanced Directives 12/02/2019  Does Patient Have a Medical Advance Directive? Yes  Type of Advance Directive Out of facility DNR (pink MOST or yellow form);Healthcare Power of Attorney  Does patient want to make changes to medical advance directive? No - Patient declined  Copy of Healthcare Power of Attorney in Chart? Yes - validated most recent copy scanned in chart (See row information)  Would patient like information on creating a medical advance directive? -  Pre-existing out of facility DNR order (yellow form or pink MOST form) Yellow form placed in chart (order not valid for inpatient use)     Chief Complaint  Patient presents with  . Medical Management of Chronic Issues    HPI:  Pt is a 86 y.o. female seen today for medical management of chronic diseases.    Patient has h/o Atrial Fibrillation,LE edema, Hypertension, Dysphagia,Essential Tremor, Anemia, Dementia,and depression, Falls Weight loss Patient is a long-term resident  Patietn has end stage Dementia in Hospice care Continues have issues with falling. Had decreased her Procardia on last visit to see if it will help with Falls But no change. And Tremor seem worse today Her weight is stable Cannot  give any history Past Medical History:  Diagnosis Date  . Anticoagulant long-term use   . Atrial fibrillation (HCC)   . CHF (congestive heart failure) (HCC)   . Chronic anticoagulation   . Hypertension   . Raynaud phenomenon   . Tremor, essential    Past Surgical History:  Procedure Laterality Date  . CHOLECYSTECTOMY    . TONSILLECTOMY      Allergies  Allergen Reactions  . Fosamax [Alendronate]   . Other Nausea And Vomiting    Green pepper    Allergies as of 12/02/2019      Reactions   Fosamax [alendronate]    Other Nausea And Vomiting   Green pepper      Medication List       Accurate as of December 02, 2019 11:59 PM. If you have any questions, ask your nurse or doctor.        acetaminophen 500 MG tablet Commonly known as: TYLENOL Take 500 mg by mouth every 8 (eight) hours as needed for mild pain or moderate pain.   acetaminophen 500 MG tablet Commonly known as: TYLENOL Take 1,000 mg by mouth 2 (two) times daily. Not to exceed 3,00 mg daily.   Artificial Tears 0.2-0.2-1 % Soln Generic drug: Glycerin-Hypromellose-PEG 400 Place 1 drop into both eyes 3 (three) times daily.   Caltrate 600+D Plus Minerals 600-800 MG-UNIT Chew Chew 1 tablet by mouth 2 (two) times a day.   mirtazapine 15 MG tablet Commonly known as: REMERON Take 15 mg by mouth at bedtime.   NON FORMULARY Regular, Nectar Thick, Mech Soft Special Instructions: Regular diet  with Nectar thick liquids by straw. 2 handled cup and divided plate with built-up utensils for meals.   propranolol ER 60 MG 24 hr capsule Commonly known as: INDERAL LA Take 60 mg by mouth daily. What changed: Another medication with the same name was removed. Continue taking this medication, and follow the directions you see here. Changed by: [name], MD   RESOURCE 2.0 PO Take 120 mLs by mouth 2 (two) times daily. MORNING 07:00 AM - 10:00 AM, EVENING 07:00 PM - 10:00 PM   sennosides-docusate sodium 8.6-50 MG  tablet Commonly known as: SENOKOT-S Take 2 tablets by mouth at bedtime.       Review of Systems  Unable to perform ROS: Dementia    Immunization History  Administered Date(s) Administered  . Influenza Whole 05/09/2018  . Influenza, High Dose Seasonal PF 05/09/2019  . Influenza-Unspecified 05/28/2017  . Moderna SARS-COVID-2 Vaccination 08/09/2019, 09/06/2019  . Pneumococcal Conjugate-13 07/13/2017  . Pneumococcal Polysaccharide-23 04/07/2002  . Tdap 07/13/2017, 10/08/2017   Pertinent  Health Maintenance Due  Topic Date Due  . INFLUENZA VACCINE  03/07/2020  . DEXA SCAN  Completed  . PNA vac Low Risk Adult  Completed   Fall Risk  03/10/2019 10/05/2017  Falls in the past year? (No Data) No  Comment Emmi Telephone Survey: data to providers prior to load -  Number falls in past yr: (No Data) -  Comment Emmi Telephone Survey Actual Response =  -   Functional Status Survey:    Vitals:   12/02/19 0936  BP: (!) 102/52  Pulse: 94  Resp: 20  Temp: 97.8 F (36.6 C)  SpO2: 94%  Weight: 98 lb (44.5 kg)  Height: 5\' 4"  (1.626 m)   Body mass index is 16.82 kg/m. Physical Exam Constitutional:      Comments: Very Frail  HENT:     Head: Normocephalic.     Nose: Nose normal.     Mouth/Throat:     Mouth: Mucous membranes are moist.     Pharynx: Oropharynx is clear.  Eyes:     Pupils: Pupils are equal, round, and reactive to light.  Cardiovascular:     Rate and Rhythm: Normal rate. Rhythm irregular.     Pulses: Normal pulses.     Heart sounds: Normal heart sounds.  Pulmonary:     Effort: Pulmonary effort is normal.     Breath sounds: Normal breath sounds.  Abdominal:     General: Abdomen is flat.     Palpations: Abdomen is soft.  Musculoskeletal:        General: No swelling.     Cervical back: Normal range of motion and neck supple.  Skin:    General: Skin is warm.  Neurological:     General: No focal deficit present.     Mental Status: She is alert.     Comments:  Walks with the walker. Unsteady Gait Doe snot follow any commands  Psychiatric:        Mood and Affect: Mood normal.        Thought Content: Thought content normal.     Labs reviewed: Recent Labs    05/13/19 0000 05/21/19 0000 10/28/19 0000  NA 142 144 144  K 3.8 3.7 4.9  CL 102 103 105  CO2 33 33 33*  BUN 28* 26* 29*  CREATININE 1.1 1.1 1.1  CALCIUM 9.5 9.9 9.9   Recent Labs    05/13/19 0000 05/21/19 0000 10/28/19 0000  AST 14 13 15  ALT 6* 5* 6*  ALKPHOS 43 41 43  PROT 6.3* 6.1*  --   ALBUMIN 3.8 3.7 3.5   Recent Labs    05/13/19 0000 05/21/19 0000 10/28/19 0000  WBC 8.1 9.1 8.1  NEUTROABS  --  5,651  --   HGB 12.5 12.0 11.4*  HCT 37 36 34*  PLT 204 183 186   Lab Results  Component Value Date   TSH 0.76 05/21/2019   No results found for: HGBA1C No results found for: CHOL, HDL, LDLCALC, LDLDIRECT, TRIG, CHOLHDL  Significant Diagnostic Results in last 30 days:  No results found.  Assessment/Plan  Frequent falls Procardia Was reduced last visit to see if it helps But patient contineus to have falls due to Poor safety awareness Walks with her walker  Depression, recurrent (HCC) Remeron for weight loss  Chronic a-fib (HCC) Was taken off eliquis due to falls  Mixed Alzheimer's and vascular dementia (HCC) On Remeron Has not tolerated any Meds Supportive care Tremor, essential On Procardia     Family/ staff Communication:   Labs/tests ordered:    Total time spent in this patient care encounter was  25_  minutes; greater than 50% of the visit spent counseling  staff, reviewing records , Labs and coordinating care for problems addressed at this encounter.

## 2019-12-03 DIAGNOSIS — I509 Heart failure, unspecified: Secondary | ICD-10-CM | POA: Diagnosis not present

## 2019-12-03 DIAGNOSIS — I130 Hypertensive heart and chronic kidney disease with heart failure and stage 1 through stage 4 chronic kidney disease, or unspecified chronic kidney disease: Secondary | ICD-10-CM | POA: Diagnosis not present

## 2019-12-03 DIAGNOSIS — G309 Alzheimer's disease, unspecified: Secondary | ICD-10-CM | POA: Diagnosis not present

## 2019-12-03 DIAGNOSIS — N183 Chronic kidney disease, stage 3 unspecified: Secondary | ICD-10-CM | POA: Diagnosis not present

## 2019-12-03 DIAGNOSIS — F0280 Dementia in other diseases classified elsewhere without behavioral disturbance: Secondary | ICD-10-CM | POA: Diagnosis not present

## 2019-12-03 DIAGNOSIS — R296 Repeated falls: Secondary | ICD-10-CM | POA: Diagnosis not present

## 2019-12-04 DIAGNOSIS — G309 Alzheimer's disease, unspecified: Secondary | ICD-10-CM | POA: Diagnosis not present

## 2019-12-04 DIAGNOSIS — I509 Heart failure, unspecified: Secondary | ICD-10-CM | POA: Diagnosis not present

## 2019-12-04 DIAGNOSIS — F0280 Dementia in other diseases classified elsewhere without behavioral disturbance: Secondary | ICD-10-CM | POA: Diagnosis not present

## 2019-12-04 DIAGNOSIS — R296 Repeated falls: Secondary | ICD-10-CM | POA: Diagnosis not present

## 2019-12-04 DIAGNOSIS — N183 Chronic kidney disease, stage 3 unspecified: Secondary | ICD-10-CM | POA: Diagnosis not present

## 2019-12-04 DIAGNOSIS — I130 Hypertensive heart and chronic kidney disease with heart failure and stage 1 through stage 4 chronic kidney disease, or unspecified chronic kidney disease: Secondary | ICD-10-CM | POA: Diagnosis not present

## 2019-12-06 DIAGNOSIS — G250 Essential tremor: Secondary | ICD-10-CM | POA: Diagnosis not present

## 2019-12-06 DIAGNOSIS — S0181XD Laceration without foreign body of other part of head, subsequent encounter: Secondary | ICD-10-CM | POA: Diagnosis not present

## 2019-12-06 DIAGNOSIS — I130 Hypertensive heart and chronic kidney disease with heart failure and stage 1 through stage 4 chronic kidney disease, or unspecified chronic kidney disease: Secondary | ICD-10-CM | POA: Diagnosis not present

## 2019-12-06 DIAGNOSIS — Z87440 Personal history of urinary (tract) infections: Secondary | ICD-10-CM | POA: Diagnosis not present

## 2019-12-06 DIAGNOSIS — S22080D Wedge compression fracture of T11-T12 vertebra, subsequent encounter for fracture with routine healing: Secondary | ICD-10-CM | POA: Diagnosis not present

## 2019-12-06 DIAGNOSIS — I7300 Raynaud's syndrome without gangrene: Secondary | ICD-10-CM | POA: Diagnosis not present

## 2019-12-06 DIAGNOSIS — I509 Heart failure, unspecified: Secondary | ICD-10-CM | POA: Diagnosis not present

## 2019-12-06 DIAGNOSIS — E46 Unspecified protein-calorie malnutrition: Secondary | ICD-10-CM | POA: Diagnosis not present

## 2019-12-06 DIAGNOSIS — N183 Chronic kidney disease, stage 3 unspecified: Secondary | ICD-10-CM | POA: Diagnosis not present

## 2019-12-06 DIAGNOSIS — D631 Anemia in chronic kidney disease: Secondary | ICD-10-CM | POA: Diagnosis not present

## 2019-12-06 DIAGNOSIS — F418 Other specified anxiety disorders: Secondary | ICD-10-CM | POA: Diagnosis not present

## 2019-12-06 DIAGNOSIS — R159 Full incontinence of feces: Secondary | ICD-10-CM | POA: Diagnosis not present

## 2019-12-06 DIAGNOSIS — F0280 Dementia in other diseases classified elsewhere without behavioral disturbance: Secondary | ICD-10-CM | POA: Diagnosis not present

## 2019-12-06 DIAGNOSIS — R296 Repeated falls: Secondary | ICD-10-CM | POA: Diagnosis not present

## 2019-12-06 DIAGNOSIS — H01003 Unspecified blepharitis right eye, unspecified eyelid: Secondary | ICD-10-CM | POA: Diagnosis not present

## 2019-12-06 DIAGNOSIS — Z681 Body mass index (BMI) 19 or less, adult: Secondary | ICD-10-CM | POA: Diagnosis not present

## 2019-12-06 DIAGNOSIS — G309 Alzheimer's disease, unspecified: Secondary | ICD-10-CM | POA: Diagnosis not present

## 2019-12-06 DIAGNOSIS — Z947 Corneal transplant status: Secondary | ICD-10-CM | POA: Diagnosis not present

## 2019-12-06 DIAGNOSIS — Z741 Need for assistance with personal care: Secondary | ICD-10-CM | POA: Diagnosis not present

## 2019-12-06 DIAGNOSIS — I4891 Unspecified atrial fibrillation: Secondary | ICD-10-CM | POA: Diagnosis not present

## 2019-12-06 DIAGNOSIS — R32 Unspecified urinary incontinence: Secondary | ICD-10-CM | POA: Diagnosis not present

## 2019-12-07 DIAGNOSIS — I130 Hypertensive heart and chronic kidney disease with heart failure and stage 1 through stage 4 chronic kidney disease, or unspecified chronic kidney disease: Secondary | ICD-10-CM | POA: Diagnosis not present

## 2019-12-07 DIAGNOSIS — N183 Chronic kidney disease, stage 3 unspecified: Secondary | ICD-10-CM | POA: Diagnosis not present

## 2019-12-07 DIAGNOSIS — I509 Heart failure, unspecified: Secondary | ICD-10-CM | POA: Diagnosis not present

## 2019-12-07 DIAGNOSIS — R296 Repeated falls: Secondary | ICD-10-CM | POA: Diagnosis not present

## 2019-12-07 DIAGNOSIS — F0280 Dementia in other diseases classified elsewhere without behavioral disturbance: Secondary | ICD-10-CM | POA: Diagnosis not present

## 2019-12-07 DIAGNOSIS — G309 Alzheimer's disease, unspecified: Secondary | ICD-10-CM | POA: Diagnosis not present

## 2019-12-11 ENCOUNTER — Encounter: Payer: Self-pay | Admitting: Internal Medicine

## 2019-12-11 ENCOUNTER — Non-Acute Institutional Stay (SKILLED_NURSING_FACILITY): Payer: Medicare Other | Admitting: Internal Medicine

## 2019-12-11 DIAGNOSIS — S01112A Laceration without foreign body of left eyelid and periocular area, initial encounter: Secondary | ICD-10-CM | POA: Diagnosis not present

## 2019-12-11 DIAGNOSIS — F0150 Vascular dementia without behavioral disturbance: Secondary | ICD-10-CM | POA: Diagnosis not present

## 2019-12-11 DIAGNOSIS — I482 Chronic atrial fibrillation, unspecified: Secondary | ICD-10-CM

## 2019-12-11 DIAGNOSIS — I130 Hypertensive heart and chronic kidney disease with heart failure and stage 1 through stage 4 chronic kidney disease, or unspecified chronic kidney disease: Secondary | ICD-10-CM | POA: Diagnosis not present

## 2019-12-11 DIAGNOSIS — G309 Alzheimer's disease, unspecified: Secondary | ICD-10-CM

## 2019-12-11 DIAGNOSIS — G250 Essential tremor: Secondary | ICD-10-CM | POA: Diagnosis not present

## 2019-12-11 DIAGNOSIS — N183 Chronic kidney disease, stage 3 unspecified: Secondary | ICD-10-CM | POA: Diagnosis not present

## 2019-12-11 DIAGNOSIS — F0280 Dementia in other diseases classified elsewhere without behavioral disturbance: Secondary | ICD-10-CM | POA: Diagnosis not present

## 2019-12-11 DIAGNOSIS — I509 Heart failure, unspecified: Secondary | ICD-10-CM | POA: Diagnosis not present

## 2019-12-11 DIAGNOSIS — R296 Repeated falls: Secondary | ICD-10-CM | POA: Diagnosis not present

## 2019-12-11 NOTE — Progress Notes (Signed)
Location:  [facility] Room Number: 31-A Place of Service:  SNF [phone]) Provider:  Mahlon Gammon, MD  Patient Care Team: Mahlon Gammon, MD as PCP - General (Internal Medicine) Nahser, Deloris Ping, MD as PCP - Cardiology (Cardiology) Mast, Man X, NP as Nurse Practitioner (Internal Medicine)  Extended Emergency Contact Information Primary Emergency Contact: Morgan,Tom Address: [address]          [place], [place] [address] [place] Home Phone: [phone] Relation: Friend Secondary Emergency Contact: Brandy [place] Home Phone: [phone] Relation: Niece  Code Status:  DNR Goals of care: Advanced Directive information Advanced Directives 12/11/2019  Does Patient Have a Medical Advance Directive? Yes  Type of Advance Directive Healthcare Power of Attorney  Does patient want to make changes to medical advance directive? No - Patient declined  Copy of Healthcare Power of Attorney in Chart? Yes - validated most recent copy scanned in chart (See row information)  Would patient like information on creating a medical advance directive? -  Pre-existing out of facility DNR order (yellow form or pink MOST form) -     Chief Complaint  Patient presents with  . Acute Visit    Patient is seen for eye hemorrhage.    HPI:  Pt is a 86 y.o. female seen today for an acute visit for Bleeding from the side of the Eye  Patient has h/o Atrial Fibrillation,LE edema, Hypertension, Dysphagia,Essential Tremor, Anemia, Dementia,and depression, Falls Weight loss Patient is a long-term resident  Patietn has end stage Dementia in Hospice care Nurse noticed Bleeding from the side of the eye. Patient would not let us examine Her  Past Medical History:  Diagnosis Date  . Anticoagulant long-term use   . Atrial fibrillation (HCC)   . CHF (congestive heart failure) (HCC)   . Chronic anticoagulation   . Hypertension   .  Raynaud phenomenon   . Tremor, essential    Past Surgical History:  Procedure Laterality Date  . CHOLECYSTECTOMY    . TONSILLECTOMY      Allergies  Allergen Reactions  . Fosamax [Alendronate]   . Other Nausea And Vomiting    Green pepper    Outpatient Encounter Medications as of 12/11/2019  Medication Sig  . acetaminophen (TYLENOL) 500 MG tablet Take 500 mg by mouth every 8 (eight) hours as needed for mild pain or moderate pain.   Marland Kitchen acetaminophen (TYLENOL) 500 MG tablet Take 1,000 mg by mouth 2 (two) times daily. Not to exceed 3,00 mg daily.  . Calcium Carbonate-Vit D-Min (CALTRATE 600+D PLUS MINERALS) 600-800 MG-UNIT CHEW Chew 1 tablet by mouth 2 (two) times a day.  . Glycerin-Hypromellose-PEG 400 (ARTIFICIAL TEARS) 0.2-0.2-1 % SOLN Place 1 drop into both eyes 3 (three) times daily.   . mirtazapine (REMERON) 15 MG tablet Take 15 mg by mouth at bedtime.   . NON FORMULARY Regular, Nectar Thick, Mech Soft Special Instructions: Regular diet with Nectar thick liquids by straw. 2 handled cup and divided plate with built-up utensils for meals.  . Nutritional Supplements (RESOURCE 2.0 PO) Take 120 mLs by mouth 2 (two) times daily. MORNING 07:00 AM - 10:00 AM, EVENING 07:00 PM - 10:00 PM  . propranolol ER (INDERAL LA) 60 MG 24 hr capsule Take 60 mg by mouth daily.  . sennosides-docusate sodium (SENOKOT-S) 8.6-50 MG tablet Take 2 tablets by mouth at bedtime.    No facility-administered encounter medications on file as of 12/11/2019.  Review of Systems  Unable to perform ROS: Dementia    Immunization History  Administered Date(s) Administered  . Influenza Whole 05/09/2018  . Influenza, High Dose Seasonal PF 05/09/2019  . Influenza-Unspecified 05/28/2017  . Moderna SARS-COVID-2 Vaccination 08/09/2019, 09/06/2019  . Pneumococcal Conjugate-13 07/13/2017  . Pneumococcal Polysaccharide-23 04/07/2002  . Tdap 07/13/2017, 10/08/2017   Pertinent  Health Maintenance Due  Topic Date Due  .  INFLUENZA VACCINE  03/07/2020  . DEXA SCAN  Completed  . PNA vac Low Risk Adult  Completed   Fall Risk  03/10/2019 10/05/2017  Falls in the past year? (No Data) No  Comment Emmi Telephone Survey: data to providers prior to load -  Number falls in past yr: (No Data) -  Comment Emmi Telephone Survey Actual Response =  -   Functional Status Survey:    Vitals:   12/11/19 1523  BP: (!) 122/56  Pulse: 76  Resp: 20  Temp: (!) 97.1 F (36.2 C)  TempSrc: Oral  SpO2: 93%  Weight: 100 lb 6.4 oz (45.5 kg)  Height: 5\' 4"  (1.626 m)   Body mass index is 17.23 kg/m. Physical Exam Vitals reviewed.  Constitutional:      Appearance: Normal appearance.  HENT:     Head: Normocephalic.     Nose: Nose normal.     Mouth/Throat:     Mouth: Mucous membranes are moist.     Pharynx: Oropharynx is clear.  Eyes:     Pupils: Pupils are equal, round, and reactive to light.     Comments: Her Left Eye has Skin tear just below the inner part of her eye. Which is causing her to bleed. Eye itself looks Normal with no redness. It seems patient has been itching her eyes  Cardiovascular:     Rate and Rhythm: Normal rate.     Pulses: Normal pulses.  Pulmonary:     Effort: Pulmonary effort is normal.     Breath sounds: Normal breath sounds.  Abdominal:     General: Abdomen is flat. Bowel sounds are normal.     Palpations: Abdomen is soft.  Musculoskeletal:        General: No swelling.  Skin:    General: Skin is warm.  Neurological:     General: No focal deficit present.     Mental Status: She is alert.  Psychiatric:        Mood and Affect: Mood normal.       Labs reviewed: Recent Labs    05/13/19 0000 05/21/19 0000 10/28/19 0000  NA 142 144 144  K 3.8 3.7 4.9  CL 102 103 105  CO2 33 33 33*  BUN 28* 26* 29*  CREATININE 1.1 1.1 1.1  CALCIUM 9.5 9.9 9.9   Recent Labs    05/13/19 0000 05/21/19 0000 10/28/19 0000  AST 14 13 15   ALT 6* 5* 6*  ALKPHOS 43 41 43  PROT 6.3* 6.1*  --     ALBUMIN 3.8 3.7 3.5   Recent Labs    05/13/19 0000 05/21/19 0000 10/28/19 0000  WBC 8.1 9.1 8.1  NEUTROABS  --  5,651  --   HGB 12.5 12.0 11.4*  HCT 37 36 34*  PLT 204 183 186   Lab Results  Component Value Date   TSH 0.76 05/21/2019   No results found for: HGBA1C No results found for: CHOL, HDL, LDLCALC, LDLDIRECT, TRIG, CHOLHDL  Significant Diagnostic Results in last 30 days:  No results found.  Assessment/Plan Laceration of  skin of left eyelid and periocular area, initial encounter D/W hospice Nurse Will Do Artifical Tears Also Cut her Finger nails  Mixed Alzheimer's and vascular dementia ([place]) Ends Stage On remeron Hospice Care  Tremor, essential On Procardia  Chronic a-fib ([place]) Was taken off eliquis due to falls   Family/ staff Communication:   Labs/tests ordered:

## 2019-12-14 DIAGNOSIS — R296 Repeated falls: Secondary | ICD-10-CM | POA: Diagnosis not present

## 2019-12-14 DIAGNOSIS — N183 Chronic kidney disease, stage 3 unspecified: Secondary | ICD-10-CM | POA: Diagnosis not present

## 2019-12-14 DIAGNOSIS — I509 Heart failure, unspecified: Secondary | ICD-10-CM | POA: Diagnosis not present

## 2019-12-14 DIAGNOSIS — F0280 Dementia in other diseases classified elsewhere without behavioral disturbance: Secondary | ICD-10-CM | POA: Diagnosis not present

## 2019-12-14 DIAGNOSIS — G309 Alzheimer's disease, unspecified: Secondary | ICD-10-CM | POA: Diagnosis not present

## 2019-12-14 DIAGNOSIS — I130 Hypertensive heart and chronic kidney disease with heart failure and stage 1 through stage 4 chronic kidney disease, or unspecified chronic kidney disease: Secondary | ICD-10-CM | POA: Diagnosis not present

## 2019-12-16 DIAGNOSIS — G309 Alzheimer's disease, unspecified: Secondary | ICD-10-CM | POA: Diagnosis not present

## 2019-12-16 DIAGNOSIS — R296 Repeated falls: Secondary | ICD-10-CM | POA: Diagnosis not present

## 2019-12-16 DIAGNOSIS — I509 Heart failure, unspecified: Secondary | ICD-10-CM | POA: Diagnosis not present

## 2019-12-16 DIAGNOSIS — F0280 Dementia in other diseases classified elsewhere without behavioral disturbance: Secondary | ICD-10-CM | POA: Diagnosis not present

## 2019-12-16 DIAGNOSIS — N183 Chronic kidney disease, stage 3 unspecified: Secondary | ICD-10-CM | POA: Diagnosis not present

## 2019-12-16 DIAGNOSIS — I130 Hypertensive heart and chronic kidney disease with heart failure and stage 1 through stage 4 chronic kidney disease, or unspecified chronic kidney disease: Secondary | ICD-10-CM | POA: Diagnosis not present

## 2019-12-17 ENCOUNTER — Non-Acute Institutional Stay (SKILLED_NURSING_FACILITY): Payer: Medicare Other | Admitting: Nurse Practitioner

## 2019-12-17 DIAGNOSIS — G250 Essential tremor: Secondary | ICD-10-CM | POA: Diagnosis not present

## 2019-12-17 DIAGNOSIS — G309 Alzheimer's disease, unspecified: Secondary | ICD-10-CM | POA: Diagnosis not present

## 2019-12-17 DIAGNOSIS — F0280 Dementia in other diseases classified elsewhere without behavioral disturbance: Secondary | ICD-10-CM

## 2019-12-17 DIAGNOSIS — I482 Chronic atrial fibrillation, unspecified: Secondary | ICD-10-CM | POA: Diagnosis not present

## 2019-12-17 DIAGNOSIS — R296 Repeated falls: Secondary | ICD-10-CM | POA: Diagnosis not present

## 2019-12-17 DIAGNOSIS — I509 Heart failure, unspecified: Secondary | ICD-10-CM | POA: Diagnosis not present

## 2019-12-17 DIAGNOSIS — I10 Essential (primary) hypertension: Secondary | ICD-10-CM

## 2019-12-17 DIAGNOSIS — K5901 Slow transit constipation: Secondary | ICD-10-CM | POA: Diagnosis not present

## 2019-12-17 DIAGNOSIS — R269 Unspecified abnormalities of gait and mobility: Secondary | ICD-10-CM | POA: Diagnosis not present

## 2019-12-17 DIAGNOSIS — N183 Chronic kidney disease, stage 3 unspecified: Secondary | ICD-10-CM | POA: Diagnosis not present

## 2019-12-17 DIAGNOSIS — F0150 Vascular dementia without behavioral disturbance: Secondary | ICD-10-CM | POA: Diagnosis not present

## 2019-12-17 DIAGNOSIS — F339 Major depressive disorder, recurrent, unspecified: Secondary | ICD-10-CM | POA: Diagnosis not present

## 2019-12-17 DIAGNOSIS — R627 Adult failure to thrive: Secondary | ICD-10-CM | POA: Diagnosis not present

## 2019-12-17 DIAGNOSIS — M545 Low back pain, unspecified: Secondary | ICD-10-CM

## 2019-12-17 DIAGNOSIS — I130 Hypertensive heart and chronic kidney disease with heart failure and stage 1 through stage 4 chronic kidney disease, or unspecified chronic kidney disease: Secondary | ICD-10-CM | POA: Diagnosis not present

## 2019-12-17 NOTE — Assessment & Plan Note (Signed)
Blood pressure is controlled

## 2019-12-17 NOTE — Assessment & Plan Note (Signed)
Stable, continue Mirtazapine.  

## 2019-12-17 NOTE — Assessment & Plan Note (Signed)
Continue ambulation with walker with SBA

## 2019-12-17 NOTE — Assessment & Plan Note (Signed)
No significant change, still able to feed self, continue Propranolol.

## 2019-12-17 NOTE — Assessment & Plan Note (Signed)
Continue supportive care in SNF [facility], continue Hospice service.  

## 2019-12-17 NOTE — Assessment & Plan Note (Signed)
Heart rate is in control.  

## 2019-12-17 NOTE — Assessment & Plan Note (Signed)
Stable, continue Senokot S 

## 2019-12-17 NOTE — Progress Notes (Signed)
Location:   [facility] Room Number: 31 Place of Service:  SNF (31) Provider: Arna Snipe Reshard Guillet NP  Mahlon Gammon, MD  Patient Care Team: Mahlon Gammon, MD as PCP - General (Internal Medicine) Nahser, Deloris Ping, MD as PCP - Cardiology (Cardiology) Braxon Suder X, NP as Nurse Practitioner (Internal Medicine)  Extended Emergency Contact Information Primary Emergency Contact: Morgan,Tom Address: [address]          [place], [place] [address] [place] Home Phone: [phone] Relation: Friend Secondary Emergency Contact: Fletcher,Christa  [place] Home Phone: [phone] Relation: Niece  Code Status:  DNR Goals of care: Advanced Directive information Advanced Directives 12/11/2019  Does Patient Have a Medical Advance Directive? Yes  Type of Estate agent of [place];Out of facility DNR (pink MOST or yellow form)  Does patient want to make changes to medical advance directive? No - Patient declined  Copy of Healthcare Power of Attorney in Chart? Yes - validated most recent copy scanned in chart (See row information)  Would patient like information on creating a medical advance directive? -  Pre-existing out of facility DNR order (yellow form or pink MOST form) Yellow form placed in chart (order not valid for inpatient use)     Chief Complaint  Patient presents with  . Medical Management of Chronic Issues    HPI:  Pt is a 86 y.o. female seen today for medical management of chronic diseases.    The patient resides in SNF [facility] for safety, care assistance, ambulates with walker with SBA, her mood is managed with Mirtazapine 15mg  qd, her goal of care is supportive care. Hx of lower back pain, taking Tylenol 1000mg  bid. Essential tremor, stable, on Propranolol. Constipation, stable, taking Senokot S II qd.    Past Medical History:  Diagnosis Date  . Anticoagulant long-term use   . Atrial fibrillation  (HCC)   . CHF (congestive heart failure) (HCC)   . Chronic anticoagulation   . Hypertension   . Raynaud phenomenon   . Tremor, essential    Past Surgical History:  Procedure Laterality Date  . CHOLECYSTECTOMY    . TONSILLECTOMY      Allergies  Allergen Reactions  . Fosamax [Alendronate]   . Other Nausea And Vomiting    Green pepper    Allergies as of 12/17/2019      Reactions   Fosamax [alendronate]    Other Nausea And Vomiting   Green pepper      Medication List       Accurate as of Dec 17, 2019 11:59 PM. If you have any questions, ask your nurse or doctor.        acetaminophen 500 MG tablet Commonly known as: TYLENOL Take 500 mg by mouth every 8 (eight) hours as needed for mild pain or moderate pain.   acetaminophen 500 MG tablet Commonly known as: TYLENOL Take 1,000 mg by mouth 2 (two) times daily. Not to exceed 3,00 mg daily.   Artificial Tears 0.2-0.2-1 % Soln Generic drug: Glycerin-Hypromellose-PEG 400 Place 1 drop into both eyes 3 (three) times daily.   Caltrate 600+D Plus Minerals 600-800 MG-UNIT Chew Chew 1 tablet by mouth 2 (two) times a day.   mirtazapine 15 MG tablet Commonly known as: REMERON Take 15 mg by mouth at bedtime.   NON FORMULARY Regular, Nectar Thick, Mech Soft Special Instructions: Regular diet with Nectar thick liquids by straw. 2 handled cup and divided plate with  built-up utensils for meals.   propranolol ER 60 MG 24 hr capsule Commonly known as: INDERAL LA Take 60 mg by mouth daily.   RESOURCE 2.0 PO Take 120 mLs by mouth 2 (two) times daily. MORNING 07:00 AM - 10:00 AM, EVENING 07:00 PM - 10:00 PM   sennosides-docusate sodium 8.6-50 MG tablet Commonly known as: SENOKOT-S Take 2 tablets by mouth at bedtime.       Review of Systems  Constitutional: Negative for activity change, appetite change, fever and unexpected weight change.  HENT: Positive for hearing loss. Negative for congestion and voice change.   Eyes:  Negative for visual disturbance.  Respiratory: Negative for shortness of breath.   Cardiovascular: Negative for leg swelling.  Gastrointestinal: Negative for abdominal pain and constipation.  Genitourinary: Negative for difficulty urinating, dysuria and urgency.  Musculoskeletal: Positive for arthralgias, back pain and gait problem.  Skin: Negative for color change.  Neurological: Positive for tremors. Negative for dizziness, speech difficulty and headaches.       Dementia  Psychiatric/Behavioral: Positive for confusion. Negative for agitation and sleep disturbance.       Occasional emotional outburst    Immunization History  Administered Date(s) Administered  . Influenza Whole 05/09/2018  . Influenza, High Dose Seasonal PF 05/09/2019  . Influenza-Unspecified 05/28/2017  . Moderna SARS-COVID-2 Vaccination 08/09/2019, 09/06/2019  . Pneumococcal Conjugate-13 07/13/2017  . Pneumococcal Polysaccharide-23 04/07/2002  . Tdap 07/13/2017, 10/08/2017   Pertinent  Health Maintenance Due  Topic Date Due  . INFLUENZA VACCINE  03/07/2020  . DEXA SCAN  Completed  . PNA vac Low Risk Adult  Completed   Fall Risk  03/10/2019 10/05/2017  Falls in the past year? (No Data) No  Comment Emmi Telephone Survey: data to providers prior to load -  Number falls in past yr: (No Data) -  Comment Emmi Telephone Survey Actual Response =  -   Functional Status Survey:    Vitals:   12/18/19 1416  BP: 112/60  Pulse: 76  Resp: 20  Temp: 98.4 F (36.9 C)  SpO2: 90%  Weight: 100 lb 6.4 oz (45.5 kg)  Height: 5\' 4"  (1.626 m)   Body mass index is 17.23 kg/m. Physical Exam Vitals and nursing note reviewed.  Constitutional:      Appearance: Normal appearance.     Comments: Low body weight, but stable.   HENT:     Head: Normocephalic and atraumatic.     Mouth/Throat:     Mouth: Mucous membranes are moist.  Eyes:     Extraocular Movements: Extraocular movements intact.     Conjunctiva/sclera:  Conjunctivae normal.     Pupils: Pupils are equal, round, and reactive to light.  Cardiovascular:     Rate and Rhythm: Normal rate. Rhythm irregular.     Heart sounds: No murmur.  Pulmonary:     Breath sounds: No rales.  Abdominal:     Palpations: Abdomen is soft.     Tenderness: There is no abdominal tenderness.  Musculoskeletal:     Cervical back: Normal range of motion and neck supple.     Right lower leg: No edema.     Left lower leg: No edema.  Skin:    General: Skin is warm and dry.  Neurological:     General: No focal deficit present.     Mental Status: She is alert. Mental status is at baseline.     Motor: No weakness.     Coordination: Coordination abnormal.     Gait: Gait  abnormal.     Comments: Tremor in fingers. Oriented to self.   Psychiatric:     Comments: Confused.      Labs reviewed: Recent Labs    05/13/19 0000 05/21/19 0000 10/28/19 0000  NA 142 144 144  K 3.8 3.7 4.9  CL 102 103 105  CO2 33 33 33*  BUN 28* 26* 29*  CREATININE 1.1 1.1 1.1  CALCIUM 9.5 9.9 9.9   Recent Labs    05/13/19 0000 05/21/19 0000 10/28/19 0000  AST 14 13 15   ALT 6* 5* 6*  ALKPHOS 43 41 43  PROT 6.3* 6.1*  --   ALBUMIN 3.8 3.7 3.5   Recent Labs    05/13/19 0000 05/21/19 0000 10/28/19 0000  WBC 8.1 9.1 8.1  NEUTROABS  --  5,651  --   HGB 12.5 12.0 11.4*  HCT 37 36 34*  PLT 204 183 186   Lab Results  Component Value Date   TSH 0.76 05/21/2019   No results found for: HGBA1C No results found for: CHOL, HDL, LDLCALC, LDLDIRECT, TRIG, CHOLHDL  Significant Diagnostic Results in last 30 days:  No results found.  Assessment/Plan  Chronic a-fib (HCC) Heart rate is in control.   Hypertension Blood pressure is controlled.   Mixed Alzheimer's and vascular dementia ([place]) Continue supportive care in SNF [facility], continue Hospice service.   Slow transit constipation Stable, continue Senokot S  Tremor, essential No significant change, still able to feed self,  continue Propranolol.   Adult failure to thrive Stable, continue Mirtazapine.   Depression, recurrent ([place]) Stable, continue Mirtazapine.   Gait abnormality Continue ambulation with walker with SBA  Lower back pain Stable, continue Tylenol 1000mg  bid.    Family/ staff Communication: plan of care reviewed with the patient and charge nurse.   Labs/tests ordered: none  Time spend 25 minutes.

## 2019-12-17 NOTE — Assessment & Plan Note (Signed)
Stable, continue Tylenol 1000mg bid.  

## 2019-12-18 ENCOUNTER — Encounter: Payer: Self-pay | Admitting: Nurse Practitioner

## 2019-12-18 DIAGNOSIS — I509 Heart failure, unspecified: Secondary | ICD-10-CM | POA: Diagnosis not present

## 2019-12-18 DIAGNOSIS — I130 Hypertensive heart and chronic kidney disease with heart failure and stage 1 through stage 4 chronic kidney disease, or unspecified chronic kidney disease: Secondary | ICD-10-CM | POA: Diagnosis not present

## 2019-12-18 DIAGNOSIS — F0280 Dementia in other diseases classified elsewhere without behavioral disturbance: Secondary | ICD-10-CM | POA: Diagnosis not present

## 2019-12-18 DIAGNOSIS — R296 Repeated falls: Secondary | ICD-10-CM | POA: Diagnosis not present

## 2019-12-18 DIAGNOSIS — G309 Alzheimer's disease, unspecified: Secondary | ICD-10-CM | POA: Diagnosis not present

## 2019-12-18 DIAGNOSIS — N183 Chronic kidney disease, stage 3 unspecified: Secondary | ICD-10-CM | POA: Diagnosis not present

## 2019-12-21 DIAGNOSIS — G309 Alzheimer's disease, unspecified: Secondary | ICD-10-CM | POA: Diagnosis not present

## 2019-12-21 DIAGNOSIS — R296 Repeated falls: Secondary | ICD-10-CM | POA: Diagnosis not present

## 2019-12-21 DIAGNOSIS — N183 Chronic kidney disease, stage 3 unspecified: Secondary | ICD-10-CM | POA: Diagnosis not present

## 2019-12-21 DIAGNOSIS — I509 Heart failure, unspecified: Secondary | ICD-10-CM | POA: Diagnosis not present

## 2019-12-21 DIAGNOSIS — F0280 Dementia in other diseases classified elsewhere without behavioral disturbance: Secondary | ICD-10-CM | POA: Diagnosis not present

## 2019-12-21 DIAGNOSIS — I130 Hypertensive heart and chronic kidney disease with heart failure and stage 1 through stage 4 chronic kidney disease, or unspecified chronic kidney disease: Secondary | ICD-10-CM | POA: Diagnosis not present

## 2019-12-25 DIAGNOSIS — N183 Chronic kidney disease, stage 3 unspecified: Secondary | ICD-10-CM | POA: Diagnosis not present

## 2019-12-25 DIAGNOSIS — R296 Repeated falls: Secondary | ICD-10-CM | POA: Diagnosis not present

## 2019-12-25 DIAGNOSIS — G309 Alzheimer's disease, unspecified: Secondary | ICD-10-CM | POA: Diagnosis not present

## 2019-12-25 DIAGNOSIS — I130 Hypertensive heart and chronic kidney disease with heart failure and stage 1 through stage 4 chronic kidney disease, or unspecified chronic kidney disease: Secondary | ICD-10-CM | POA: Diagnosis not present

## 2019-12-25 DIAGNOSIS — F0280 Dementia in other diseases classified elsewhere without behavioral disturbance: Secondary | ICD-10-CM | POA: Diagnosis not present

## 2019-12-25 DIAGNOSIS — I509 Heart failure, unspecified: Secondary | ICD-10-CM | POA: Diagnosis not present

## 2019-12-26 DIAGNOSIS — I130 Hypertensive heart and chronic kidney disease with heart failure and stage 1 through stage 4 chronic kidney disease, or unspecified chronic kidney disease: Secondary | ICD-10-CM | POA: Diagnosis not present

## 2019-12-26 DIAGNOSIS — R296 Repeated falls: Secondary | ICD-10-CM | POA: Diagnosis not present

## 2019-12-26 DIAGNOSIS — I509 Heart failure, unspecified: Secondary | ICD-10-CM | POA: Diagnosis not present

## 2019-12-26 DIAGNOSIS — F0280 Dementia in other diseases classified elsewhere without behavioral disturbance: Secondary | ICD-10-CM | POA: Diagnosis not present

## 2019-12-26 DIAGNOSIS — G309 Alzheimer's disease, unspecified: Secondary | ICD-10-CM | POA: Diagnosis not present

## 2019-12-26 DIAGNOSIS — N183 Chronic kidney disease, stage 3 unspecified: Secondary | ICD-10-CM | POA: Diagnosis not present

## 2019-12-28 DIAGNOSIS — R296 Repeated falls: Secondary | ICD-10-CM | POA: Diagnosis not present

## 2019-12-28 DIAGNOSIS — G309 Alzheimer's disease, unspecified: Secondary | ICD-10-CM | POA: Diagnosis not present

## 2019-12-28 DIAGNOSIS — F0280 Dementia in other diseases classified elsewhere without behavioral disturbance: Secondary | ICD-10-CM | POA: Diagnosis not present

## 2019-12-28 DIAGNOSIS — N183 Chronic kidney disease, stage 3 unspecified: Secondary | ICD-10-CM | POA: Diagnosis not present

## 2019-12-28 DIAGNOSIS — I130 Hypertensive heart and chronic kidney disease with heart failure and stage 1 through stage 4 chronic kidney disease, or unspecified chronic kidney disease: Secondary | ICD-10-CM | POA: Diagnosis not present

## 2019-12-28 DIAGNOSIS — I509 Heart failure, unspecified: Secondary | ICD-10-CM | POA: Diagnosis not present

## 2019-12-29 DIAGNOSIS — I130 Hypertensive heart and chronic kidney disease with heart failure and stage 1 through stage 4 chronic kidney disease, or unspecified chronic kidney disease: Secondary | ICD-10-CM | POA: Diagnosis not present

## 2019-12-29 DIAGNOSIS — F0280 Dementia in other diseases classified elsewhere without behavioral disturbance: Secondary | ICD-10-CM | POA: Diagnosis not present

## 2019-12-29 DIAGNOSIS — G309 Alzheimer's disease, unspecified: Secondary | ICD-10-CM | POA: Diagnosis not present

## 2019-12-29 DIAGNOSIS — I509 Heart failure, unspecified: Secondary | ICD-10-CM | POA: Diagnosis not present

## 2019-12-29 DIAGNOSIS — N183 Chronic kidney disease, stage 3 unspecified: Secondary | ICD-10-CM | POA: Diagnosis not present

## 2019-12-29 DIAGNOSIS — R296 Repeated falls: Secondary | ICD-10-CM | POA: Diagnosis not present

## 2019-12-31 DIAGNOSIS — F0280 Dementia in other diseases classified elsewhere without behavioral disturbance: Secondary | ICD-10-CM | POA: Diagnosis not present

## 2019-12-31 DIAGNOSIS — G309 Alzheimer's disease, unspecified: Secondary | ICD-10-CM | POA: Diagnosis not present

## 2019-12-31 DIAGNOSIS — N183 Chronic kidney disease, stage 3 unspecified: Secondary | ICD-10-CM | POA: Diagnosis not present

## 2019-12-31 DIAGNOSIS — R296 Repeated falls: Secondary | ICD-10-CM | POA: Diagnosis not present

## 2019-12-31 DIAGNOSIS — I130 Hypertensive heart and chronic kidney disease with heart failure and stage 1 through stage 4 chronic kidney disease, or unspecified chronic kidney disease: Secondary | ICD-10-CM | POA: Diagnosis not present

## 2019-12-31 DIAGNOSIS — I509 Heart failure, unspecified: Secondary | ICD-10-CM | POA: Diagnosis not present

## 2020-01-01 DIAGNOSIS — I130 Hypertensive heart and chronic kidney disease with heart failure and stage 1 through stage 4 chronic kidney disease, or unspecified chronic kidney disease: Secondary | ICD-10-CM | POA: Diagnosis not present

## 2020-01-01 DIAGNOSIS — F0280 Dementia in other diseases classified elsewhere without behavioral disturbance: Secondary | ICD-10-CM | POA: Diagnosis not present

## 2020-01-01 DIAGNOSIS — I509 Heart failure, unspecified: Secondary | ICD-10-CM | POA: Diagnosis not present

## 2020-01-01 DIAGNOSIS — N183 Chronic kidney disease, stage 3 unspecified: Secondary | ICD-10-CM | POA: Diagnosis not present

## 2020-01-01 DIAGNOSIS — R296 Repeated falls: Secondary | ICD-10-CM | POA: Diagnosis not present

## 2020-01-01 DIAGNOSIS — G309 Alzheimer's disease, unspecified: Secondary | ICD-10-CM | POA: Diagnosis not present

## 2020-01-04 DIAGNOSIS — I509 Heart failure, unspecified: Secondary | ICD-10-CM | POA: Diagnosis not present

## 2020-01-04 DIAGNOSIS — F0280 Dementia in other diseases classified elsewhere without behavioral disturbance: Secondary | ICD-10-CM | POA: Diagnosis not present

## 2020-01-04 DIAGNOSIS — N183 Chronic kidney disease, stage 3 unspecified: Secondary | ICD-10-CM | POA: Diagnosis not present

## 2020-01-04 DIAGNOSIS — R296 Repeated falls: Secondary | ICD-10-CM | POA: Diagnosis not present

## 2020-01-04 DIAGNOSIS — G309 Alzheimer's disease, unspecified: Secondary | ICD-10-CM | POA: Diagnosis not present

## 2020-01-04 DIAGNOSIS — I130 Hypertensive heart and chronic kidney disease with heart failure and stage 1 through stage 4 chronic kidney disease, or unspecified chronic kidney disease: Secondary | ICD-10-CM | POA: Diagnosis not present

## 2020-01-20 ENCOUNTER — Non-Acute Institutional Stay (SKILLED_NURSING_FACILITY): Payer: Medicare Other | Admitting: Internal Medicine

## 2020-01-20 ENCOUNTER — Encounter: Payer: Self-pay | Admitting: Internal Medicine

## 2020-01-20 DIAGNOSIS — F0150 Vascular dementia without behavioral disturbance: Secondary | ICD-10-CM

## 2020-01-20 DIAGNOSIS — R627 Adult failure to thrive: Secondary | ICD-10-CM | POA: Diagnosis not present

## 2020-01-20 DIAGNOSIS — G309 Alzheimer's disease, unspecified: Secondary | ICD-10-CM

## 2020-01-20 DIAGNOSIS — I482 Chronic atrial fibrillation, unspecified: Secondary | ICD-10-CM | POA: Diagnosis not present

## 2020-01-20 DIAGNOSIS — R296 Repeated falls: Secondary | ICD-10-CM | POA: Diagnosis not present

## 2020-01-20 DIAGNOSIS — F0280 Dementia in other diseases classified elsewhere without behavioral disturbance: Secondary | ICD-10-CM | POA: Diagnosis not present

## 2020-01-20 NOTE — Progress Notes (Signed)
Location:   [facility] Room Number: 31 Place of Service:  SNF [phone]) Provider:  Veleta Miners MD   [name], MD  Patient Care Team: [name], MD as PCP - General (Internal Medicine) Nahser, Wonda Cheng, MD as PCP - Cardiology (Cardiology) Mast, Man X, NP as Nurse Practitioner (Internal Medicine)  Extended Emergency Contact Information Primary Emergency Contact: Morgan,Tom Address: [address]          [place],  [address] Johnnette Litter of [place] Phone: [phone] Relation: Friend Secondary Emergency Contact: [place] of [place] Phone: [phone] Relation: Niece   Code Status:  DNR Goals of care: Advanced Directive information Advanced Directives 12/11/2019  Does Patient Have a Medical Advance Directive? Yes  Type of Paramedic of [place];Out of facility DNR (pink MOST or yellow form)  Does patient want to make changes to medical advance directive? No - Patient declined  Copy of [place] in Chart? Yes - validated most recent copy scanned in chart (See row information)  Would patient like information on creating a medical advance directive? -  Pre-existing out of facility DNR order (yellow form or pink MOST form) Yellow form placed in chart (order not valid for inpatient use)     Chief Complaint  Patient presents with  . Acute Visit    Fall    HPI:  Pt is a 86 y.o. female seen today for an acute visit for fall  Patient has h/o Atrial Fibrillation,LE edema, Hypertension, Dysphagia,Essential Tremor, Anemia, Dementia,and depression, FallsWeight loss Patient is a long-term resident  Has history of frequent falls History of end-stage dementia is enrolled in hospice Patient had a witnessed fall yesterday it looks like she was trying to walk without the walker and before the CNA help her she lost her balance and fell on her head.  Did not sustain  any kind of injuries Her neurochecks have been negative She could not give me any history but was in any kind of distress  Past Medical History:  Diagnosis Date  . Anticoagulant long-term use   . Atrial fibrillation ([facility])   . CHF (congestive heart failure) ([facility])   . Chronic anticoagulation   . Hypertension   . Raynaud phenomenon   . Tremor, essential    Past Surgical History:  Procedure Laterality Date  . CHOLECYSTECTOMY    . TONSILLECTOMY      Allergies  Allergen Reactions  . Fosamax [Alendronate]   . Other Nausea And Vomiting    Green pepper    Allergies as of 01/20/2020      Reactions   Fosamax [alendronate]    Other Nausea And Vomiting   Green pepper      Medication List       Accurate as of January 20, 2020  1:12 PM. If you have any questions, ask your nurse or doctor.        acetaminophen 500 MG tablet Commonly known as: TYLENOL Take 500 mg by mouth every 8 (eight) hours as needed for mild pain or moderate pain.   acetaminophen 500 MG tablet Commonly known as: TYLENOL Take 1,000 mg by mouth 2 (two) times daily. Not to exceed 3,00 mg daily.   Artificial Tears 0.2-0.2-1 % Soln Generic drug: Glycerin-Hypromellose-PEG 400 Place 1 drop into both eyes 3 (three) times daily.   Caltrate 600+D Plus Minerals 600-800 MG-UNIT Chew Chew 1 tablet by mouth 2 (two) times a day.  LORazepam 0.5 MG tablet Commonly known as: ATIVAN Take 0.5 mg by mouth 2 (two) times daily.   mirtazapine 15 MG tablet Commonly known as: REMERON Take 15 mg by mouth at bedtime.   NON FORMULARY Regular, Nectar Thick, Mech Soft Special Instructions: Regular diet with Nectar thick liquids by straw. 2 handled cup and divided plate with built-up utensils for meals.   propranolol ER 60 MG 24 hr capsule Commonly known as: INDERAL LA Take 60 mg by mouth daily.   RESOURCE 2.0 PO Take 120 mLs by mouth 2 (two) times daily. MORNING 07:00 AM - 10:00 AM, EVENING 07:00 PM - 10:00 PM     sennosides-docusate sodium 8.6-50 MG tablet Commonly known as: SENOKOT-S Take 2 tablets by mouth at bedtime.       Review of Systems  Unable to perform ROS: Dementia    Immunization History  Administered Date(s) Administered  . Influenza Whole 05/09/2018  . Influenza, High Dose Seasonal PF 05/09/2019  . Influenza-Unspecified 05/28/2017  . Moderna SARS-COVID-2 Vaccination 08/09/2019, 09/06/2019  . Pneumococcal Conjugate-13 07/13/2017  . Pneumococcal Polysaccharide-23 04/07/2002  . Tdap 07/13/2017, 10/08/2017   Pertinent  Health Maintenance Due  Topic Date Due  . INFLUENZA VACCINE  03/07/2020  . DEXA SCAN  Completed  . PNA vac Low Risk Adult  Completed   Fall Risk  03/10/2019 10/05/2017  Falls in the past year? (No Data) No  Comment Emmi Telephone Survey: data to providers prior to load -  Number falls in past yr: (No Data) -  Comment Emmi Telephone Survey Actual Response =  -   Functional Status Survey:    Vitals:   01/20/20 1308  BP: (!) 144/78  Pulse: (!) 105  Resp: 16  Temp: 97.8 F (36.6 C)  SpO2: 92%  Weight: 93 lb 14.4 oz (42.6 kg)  Height: 5\' 4"  (1.626 m)   Body mass index is 16.12 kg/m. Physical Exam Vitals reviewed.  Constitutional:      Appearance: Normal appearance.  HENT:     Head: Normocephalic.     Nose: Nose normal.     Mouth/Throat:     Mouth: Mucous membranes are moist.     Pharynx: Oropharynx is clear.  Eyes:     Pupils: Pupils are equal, round, and reactive to light.  Cardiovascular:     Rate and Rhythm: Normal rate. Rhythm irregular.     Pulses: Normal pulses.  Pulmonary:     Effort: Pulmonary effort is normal.     Breath sounds: Normal breath sounds.  Abdominal:     General: Abdomen is flat. Bowel sounds are normal.     Palpations: Abdomen is soft.  Musculoskeletal:        General: No swelling.     Cervical back: Neck supple.  Skin:    General: Skin is warm.  Neurological:     General: No focal deficit present.     Mental  Status: She is alert.  Psychiatric:        Mood and Affect: Mood normal.     Labs reviewed: Recent Labs    05/13/19 0000 05/21/19 0000 10/28/19 0000  NA 142 144 144  K 3.8 3.7 4.9  CL 102 103 105  CO2 33 33 33*  BUN 28* 26* 29*  CREATININE 1.1 1.1 1.1  CALCIUM 9.5 9.9 9.9   Recent Labs    05/13/19 0000 05/21/19 0000 10/28/19 0000  AST 14 13 15   ALT 6* 5* 6*  ALKPHOS 43 41 43  PROT 6.3* 6.1*  --   ALBUMIN 3.8 3.7 3.5   Recent Labs    05/13/19 0000 05/21/19 0000 10/28/19 0000  WBC 8.1 9.1 8.1  NEUTROABS  --  5,651  --   HGB 12.5 12.0 11.4*  HCT 37 36 34*  PLT 204 183 186   Lab Results  Component Value Date   TSH 0.76 05/21/2019   No results found for: HGBA1C No results found for: CHOL, HDL, LDLCALC, LDLDIRECT, TRIG, CHOLHDL  Significant Diagnostic Results in last 30 days:  No results found.  Assessment/Plan Recurent Falls Will decrase her Inderal to 30 mg QD Not sure if this is the cause but it will be one less med for her Continue to monitor her HR and BP Tremors I have not seen any worsenign by decreasing the dose Demenita with Behaviors Issues On Low dose of Ativan Talked  to Nurses they said it has helped with her behaviors Weight loss Weight has been stable on Remeron Patient is enrolled in hospice  Family/ staff Communication:   Labs/tests ordered:

## 2020-01-26 ENCOUNTER — Other Ambulatory Visit: Payer: Self-pay

## 2020-01-26 ENCOUNTER — Non-Acute Institutional Stay (SKILLED_NURSING_FACILITY): Payer: Medicare Other | Admitting: Nurse Practitioner

## 2020-01-26 ENCOUNTER — Encounter: Payer: Self-pay | Admitting: Nurse Practitioner

## 2020-01-26 DIAGNOSIS — R269 Unspecified abnormalities of gait and mobility: Secondary | ICD-10-CM | POA: Diagnosis not present

## 2020-01-26 DIAGNOSIS — K5901 Slow transit constipation: Secondary | ICD-10-CM

## 2020-01-26 DIAGNOSIS — F0150 Vascular dementia without behavioral disturbance: Secondary | ICD-10-CM

## 2020-01-26 DIAGNOSIS — G250 Essential tremor: Secondary | ICD-10-CM

## 2020-01-26 DIAGNOSIS — G309 Alzheimer's disease, unspecified: Secondary | ICD-10-CM | POA: Diagnosis not present

## 2020-01-26 DIAGNOSIS — F0280 Dementia in other diseases classified elsewhere without behavioral disturbance: Secondary | ICD-10-CM

## 2020-01-26 DIAGNOSIS — F339 Major depressive disorder, recurrent, unspecified: Secondary | ICD-10-CM | POA: Diagnosis not present

## 2020-01-26 MED ORDER — LORAZEPAM 0.5 MG PO TABS: 0.5000 mg | ORAL_TABLET | Freq: Two times a day (BID) | ORAL | 0 refills | Status: DC

## 2020-01-26 NOTE — Assessment & Plan Note (Signed)
Anxious, restless, pacing, Lorazepam 0.25mg  bid is helpful, but not adequate, will try  Increasing to  0.5mg  bid for now, will reduce since she is at high risk for falling,

## 2020-01-26 NOTE — Assessment & Plan Note (Signed)
Stable, continue Senokot S II qd  

## 2020-01-26 NOTE — Assessment & Plan Note (Signed)
Ambulates with walker, close supervision for safety.

## 2020-01-26 NOTE — Telephone Encounter (Signed)
Fax received from pharmacy requesting refill for Lorazepam 0.5mg  (1/2 tablet 0.25mg  twice a day)

## 2020-01-26 NOTE — Assessment & Plan Note (Addendum)
Worse, continue  Propranolol 60g qd B>R presently.

## 2020-01-26 NOTE — Assessment & Plan Note (Signed)
Advanced dementia, continue SNF [facility] for safety, care assistance, supportive care, under Hospice service.

## 2020-01-26 NOTE — Progress Notes (Signed)
Location:   [facility] Room Number: 31 Place of Service:  SNF (31) Provider: Arna Snipe Kristle Wesch NP  Mahlon Gammon, MD  Patient Care Team: Mahlon Gammon, MD as PCP - General (Internal Medicine) Nahser, Deloris Ping, MD as PCP - Cardiology (Cardiology) Sinahi Knights X, NP as Nurse Practitioner (Internal Medicine)  Extended Emergency Contact Information Primary Emergency Contact: Morgan,Tom Address: [address]          [place], [place] [address] [place] of [place] Home Phone: [phone] Relation: Friend Secondary Emergency Contact: Fletcher,Christa  [place] Home Phone: [phone] Relation: Niece  Code Status: DNR Goals of care: Advanced Directive information Advanced Directives 12/11/2019  Does Patient Have a Medical Advance Directive? Yes  Type of Estate agent of [place];Out of facility DNR (pink MOST or yellow form)  Does patient want to make changes to medical advance directive? No - Patient declined  Copy of Healthcare Power of Attorney in Chart? Yes - validated most recent copy scanned in chart (See row information)  Would patient like information on creating a medical advance directive? -  Pre-existing out of facility DNR order (yellow form or pink MOST form) Yellow form placed in chart (order not valid for inpatient use)     Chief Complaint  Patient presents with  . Acute Visit    Anxiety    HPI:  Pt is a 86 y.o. female seen today for an acute visit for anxiety, restlessness, Lorazepam 0.25mg  bid effective but not adequate.   Hx of essential tremor, Propranolol was decreased to 60mg  qd due to frequency falling  Dementia, an reside of SNF [facility], ambulates with walker, her goal of care is comfort measures, under Hospice service.   Depression/anxiety/weight, managed on Mirtazapine 15mg  qd  Constipation, takes Senokot S II qd.   Past Medical History:  Diagnosis Date  . Anticoagulant long-term use   .  Atrial fibrillation (H[facility])   . CHF (congestive heart failure) ([facility])   . Chronic anticoagulation   . Hypertension   . Raynaud phenomenon   . Tremor, essential    Past Surgical History:  Procedure Laterality Date  . CHOLECYSTECTOMY    . TONSILLECTOMY      Allergies  Allergen Reactions  . Fosamax [Alendronate]   . Other Nausea And Vomiting    Green pepper    Allergies as of 01/26/2020      Reactions   Fosamax [alendronate]    Other Nausea And Vomiting   Green pepper      Medication List       Accurate as of January 26, 2020 11:59 PM. If you have any questions, ask your nurse or doctor.        acetaminophen 500 MG tablet Commonly known as: TYLENOL Take 500 mg by mouth every 8 (eight) hours as needed for mild pain or moderate pain.   acetaminophen 500 MG tablet Commonly known as: TYLENOL Take 1,000 mg by mouth 2 (two) times daily. Not to exceed 3,00 mg daily.   Artificial Tears 0.2-0.2-1 % Soln Generic drug: Glycerin-Hypromellose-PEG 400 Place 1 drop into both eyes 3 (three) times daily.   Caltrate 600+D Plus Minerals 600-800 MG-UNIT Chew Chew 1 tablet by mouth 2 (two) times a day.   LORazepam 0.5 MG tablet Commonly known as: ATIVAN Take 0.25 mg by mouth 2 (two) times daily. What changed: Another medication with the same name was removed. Continue taking this medication, and follow the directions you see  here. Changed by: Carroll Kinds, CMA   mirtazapine 15 MG tablet Commonly known as: REMERON Take 15 mg by mouth at bedtime.   NON FORMULARY Regular, Nectar Thick, Mech Soft Special Instructions: Regular diet with Nectar thick liquids by straw. 2 handled cup and divided plate with built-up utensils for meals.   propranolol ER 60 MG 24 hr capsule Commonly known as: INDERAL LA Take 60 mg by mouth every other day.   RESOURCE 2.0 PO Take 120 mLs by mouth 2 (two) times daily. MORNING 07:00 AM - 10:00 AM, EVENING 07:00 PM - 10:00 PM   sennosides-docusate sodium  8.6-50 MG tablet Commonly known as: SENOKOT-S Take 2 tablets by mouth at bedtime.       Review of Systems  Constitutional: Negative for activity change and fever.  HENT: Positive for hearing loss. Negative for congestion and voice change.   Eyes: Negative for visual disturbance.  Respiratory: Negative for shortness of breath.   Cardiovascular: Negative for leg swelling.  Gastrointestinal: Negative for abdominal pain and constipation.  Genitourinary: Negative for difficulty urinating, dysuria and urgency.  Musculoskeletal: Positive for arthralgias, back pain and gait problem.  Skin: Negative for color change.  Neurological: Positive for tremors. Negative for speech difficulty and light-headedness.       Dementia  Psychiatric/Behavioral: Positive for confusion. Negative for agitation and sleep disturbance. The patient is nervous/anxious.        Anxious, restlessness, pacing.     Immunization History  Administered Date(s) Administered  . Influenza Whole 05/09/2018  . Influenza, High Dose Seasonal PF 05/09/2019  . Influenza-Unspecified 05/28/2017  . Moderna SARS-COVID-2 Vaccination 08/09/2019, 09/06/2019  . Pneumococcal Conjugate-13 07/13/2017  . Pneumococcal Polysaccharide-23 04/07/2002  . Tdap 07/13/2017, 10/08/2017   Pertinent  Health Maintenance Due  Topic Date Due  . INFLUENZA VACCINE  03/07/2020  . DEXA SCAN  Completed  . PNA vac Low Risk Adult  Completed   Fall Risk  03/10/2019 10/05/2017  Falls in the past year? (No Data) No  Comment Emmi Telephone Survey: data to providers prior to load -  Number falls in past yr: (No Data) -  Comment Emmi Telephone Survey Actual Response =  -   Functional Status Survey:    Vitals:   01/26/20 1607  BP: 118/64  Pulse: 74  Resp: 16  Temp: 97.8 F (36.6 C)  SpO2: 92%  Weight: 93 lb 14.4 oz (42.6 kg)  Height: 5\' 4"  (1.626 m)   Body mass index is 16.12 kg/m. Physical Exam Vitals and nursing note reviewed.  Constitutional:        Appearance: Normal appearance.     Comments: Low body weight, but stable.   HENT:     Head: Normocephalic and atraumatic.     Mouth/Throat:     Mouth: Mucous membranes are moist.  Eyes:     Extraocular Movements: Extraocular movements intact.     Conjunctiva/sclera: Conjunctivae normal.     Pupils: Pupils are equal, round, and reactive to light.  Cardiovascular:     Rate and Rhythm: Normal rate. Rhythm irregular.     Heart sounds: No murmur heard.   Pulmonary:     Breath sounds: No rales.  Abdominal:     Palpations: Abdomen is soft.     Tenderness: There is no abdominal tenderness.  Musculoskeletal:     Cervical back: Normal range of motion and neck supple.     Right lower leg: No edema.     Left lower leg: No edema.  Skin:    General: Skin is warm and dry.  Neurological:     General: No focal deficit present.     Mental Status: She is alert. Mental status is at baseline.     Coordination: Coordination abnormal.     Gait: Gait abnormal.     Comments: Tremor in fingers. Oriented to self.   Psychiatric:     Comments: Confused.      Labs reviewed: Recent Labs    05/13/19 0000 05/21/19 0000 10/28/19 0000  NA 142 144 144  K 3.8 3.7 4.9  CL 102 103 105  CO2 33 33 33*  BUN 28* 26* 29*  CREATININE 1.1 1.1 1.1  CALCIUM 9.5 9.9 9.9   Recent Labs    05/13/19 0000 05/21/19 0000 10/28/19 0000  AST 14 13 15   ALT 6* 5* 6*  ALKPHOS 43 41 43  PROT 6.3* 6.1*  --   ALBUMIN 3.8 3.7 3.5   Recent Labs    05/13/19 0000 05/21/19 0000 10/28/19 0000  WBC 8.1 9.1 8.1  NEUTROABS  --  5,651  --   HGB 12.5 12.0 11.4*  HCT 37 36 34*  PLT 204 183 186   Lab Results  Component Value Date   TSH 0.76 05/21/2019   No results found for: HGBA1C No results found for: CHOL, HDL, LDLCALC, LDLDIRECT, TRIG, CHOLHDL  Significant Diagnostic Results in last 30 days:  No results found.  Assessment/Plan: Depression, recurrent (HCC) Anxious, restless, pacing, Lorazepam 0.25mg   bid is helpful, but not adequate, will try  Increasing to  0.5mg  bid for now, will reduce since she is at high risk for falling,  Gait abnormality Ambulates with walker, close supervision for safety.   Slow transit constipation Stable, continue Senokot S II qd.   Mixed Alzheimer's and vascular dementia (HCC) Advanced dementia, continue SNF [facility] for safety, care assistance, supportive care, under Hospice service.   Tremor, essential Worse, continue  Propranolol 60g qd B>R presently.      Family/ staff Communication: plan of care reviewed with the patient and charge nurse.   Labs/tests ordered:  None  Time spend 25 minutes.

## 2020-01-27 ENCOUNTER — Encounter: Payer: Self-pay | Admitting: Nurse Practitioner

## 2020-01-28 ENCOUNTER — Encounter: Payer: Self-pay | Admitting: Nurse Practitioner

## 2020-01-28 ENCOUNTER — Non-Acute Institutional Stay (SKILLED_NURSING_FACILITY): Payer: Medicare Other | Admitting: Nurse Practitioner

## 2020-01-28 DIAGNOSIS — F0150 Vascular dementia without behavioral disturbance: Secondary | ICD-10-CM

## 2020-01-28 DIAGNOSIS — I482 Chronic atrial fibrillation, unspecified: Secondary | ICD-10-CM

## 2020-01-28 DIAGNOSIS — G309 Alzheimer's disease, unspecified: Secondary | ICD-10-CM

## 2020-01-28 DIAGNOSIS — F0280 Dementia in other diseases classified elsewhere without behavioral disturbance: Secondary | ICD-10-CM

## 2020-01-28 DIAGNOSIS — K5901 Slow transit constipation: Secondary | ICD-10-CM

## 2020-01-28 DIAGNOSIS — M545 Low back pain, unspecified: Secondary | ICD-10-CM

## 2020-01-28 DIAGNOSIS — G250 Essential tremor: Secondary | ICD-10-CM | POA: Diagnosis not present

## 2020-01-28 DIAGNOSIS — F339 Major depressive disorder, recurrent, unspecified: Secondary | ICD-10-CM

## 2020-01-28 NOTE — Assessment & Plan Note (Signed)
Advanced, pacing, anxious at times, restlessness, continue SNF [facility] for supportive care, continue Hospice service.

## 2020-01-28 NOTE — Assessment & Plan Note (Signed)
Heart rate is in control.  

## 2020-01-28 NOTE — Progress Notes (Signed)
Location:   [facility] Room Number: 31 Place of Service:  SNF (31) Provider:  Marda Stalker, Lennie Odor NP  [name], MD  Patient Care Team: [name], MD as PCP - General (Internal Medicine) Nahser, Wonda Cheng, MD as PCP - Cardiology (Cardiology) Zephaniah Enyeart X, NP as Nurse Practitioner (Internal Medicine)  Extended Emergency Contact Information Primary Emergency Contact: Morgan,Tom Address: [address]          [place], [place] [address] Johnnette Litter of [place] Phone: [phone] Relation: Friend Secondary Emergency Contact: [place] of [place] Phone: [phone] Relation: Niece  Code Status:  DNR Goals of care: Advanced Directive information Advanced Directives 12/11/2019  Does Patient Have a Medical Advance Directive? Yes  Type of Paramedic of [place];Out of facility DNR (pink MOST or yellow form)  Does patient want to make changes to medical advance directive? No - Patient declined  Copy of [place] in Chart? Yes - validated most recent copy scanned in chart (See row information)  Would patient like information on creating a medical advance directive? -  Pre-existing out of facility DNR order (yellow form or pink MOST form) Yellow form placed in chart (order not valid for inpatient use)     Chief Complaint  Patient presents with  . Medical Management of Chronic Issues    HPI:  Pt is a 86 y.o. female seen today for medical management of chronic diseases.  Essential tremor, worse, takes Propranolol '60mg'$  qd   Afib, heart rate is in control.  Anxiety/depression/restlessness, takes Lorazepam 0.'5mg'$  bid, Mirtazapine '15mg'$  qd.     OA general, takes Tylenol '1000mg'$  bid  Constipation, takes Senokot S II qhs.    Past Medical History:  Diagnosis Date  . Anticoagulant long-term use   . Atrial fibrillation ([place])   . CHF (congestive heart failure) ([place])   . Chronic  anticoagulation   . Hypertension   . Raynaud phenomenon   . Tremor, essential    Past Surgical History:  Procedure Laterality Date  . CHOLECYSTECTOMY    . TONSILLECTOMY      Allergies  Allergen Reactions  . Fosamax [Alendronate]   . Other Nausea And Vomiting    Green pepper    Allergies as of 01/28/2020      Reactions   Fosamax [alendronate]    Other Nausea And Vomiting   Green pepper      Medication List       Accurate as of January 28, 2020 11:59 PM. If you have any questions, ask your nurse or doctor.        acetaminophen 500 MG tablet Commonly known as: TYLENOL Take 500 mg by mouth every 8 (eight) hours as needed for mild pain or moderate pain.   acetaminophen 500 MG tablet Commonly known as: TYLENOL Take 1,000 mg by mouth 2 (two) times daily. Not to exceed 3,00 mg daily.   Artificial Tears 0.2-0.2-1 % Soln Generic drug: Glycerin-Hypromellose-PEG 400 Place 1 drop into both eyes 3 (three) times daily.   Caltrate 600+D Plus Minerals 600-800 MG-UNIT Chew Chew 1 tablet by mouth 2 (two) times a day.   LORazepam 0.5 MG tablet Commonly known as: ATIVAN Take 0.25 mg by mouth 2 (two) times daily.   mirtazapine 15 MG tablet Commonly known as: REMERON Take 15 mg by mouth at bedtime.   NON FORMULARY Regular, Nectar Thick, Mech Soft Special Instructions: Regular diet with Nectar thick liquids by  straw. 2 handled cup and divided plate with built-up utensils for meals.   propranolol ER 60 MG 24 hr capsule Commonly known as: INDERAL LA Take 60 mg by mouth every other day.   RESOURCE 2.0 PO Take 120 mLs by mouth 2 (two) times daily. MORNING 07:00 AM - 10:00 AM, EVENING 07:00 PM - 10:00 PM   sennosides-docusate sodium 8.6-50 MG tablet Commonly known as: SENOKOT-S Take 2 tablets by mouth at bedtime.       Review of Systems  Constitutional: Negative for fatigue, fever and unexpected weight change.  HENT: Positive for hearing loss. Negative for congestion and  voice change.   Eyes: Negative for visual disturbance.  Respiratory: Negative for shortness of breath.   Cardiovascular: Negative for leg swelling.  Gastrointestinal: Negative for abdominal pain and constipation.  Genitourinary: Negative for difficulty urinating, dysuria and urgency.  Musculoskeletal: Positive for arthralgias, back pain and gait problem.  Skin: Negative for color change.  Neurological: Positive for tremors. Negative for dizziness, speech difficulty and headaches.       Dementia. Tremors in hands are worsening.   Psychiatric/Behavioral: Positive for confusion. Negative for agitation and sleep disturbance. The patient is nervous/anxious.        Anxious, restlessness, pacing.     Immunization History  Administered Date(s) Administered  . Influenza Whole 05/09/2018  . Influenza, High Dose Seasonal PF 05/09/2019  . Influenza-Unspecified 05/28/2017  . Moderna SARS-COVID-2 Vaccination 08/09/2019, 09/06/2019  . Pneumococcal Conjugate-13 07/13/2017  . Pneumococcal Polysaccharide-23 04/07/2002  . Tdap 07/13/2017, 10/08/2017   Pertinent  Health Maintenance Due  Topic Date Due  . INFLUENZA VACCINE  03/07/2020  . DEXA SCAN  Completed  . PNA vac Low Risk Adult  Completed   Fall Risk  03/10/2019 10/05/2017  Falls in the past year? (No Data) No  Comment Emmi Telephone Survey: data to providers prior to load -  Number falls in past yr: (No Data) -  Comment Emmi Telephone Survey Actual Response =  -   Functional Status Survey:    Vitals:   01/28/20 0940  BP: 122/66  Pulse: 82  Resp: 16  Temp: 97.8 F (36.6 C)  SpO2: 92%  Weight: 93 lb 14.4 oz (42.6 kg)  Height: '5\' 4"'$  (1.626 m)   Body mass index is 16.12 kg/m. Physical Exam Vitals and nursing note reviewed.  Constitutional:      Appearance: Normal appearance.     Comments: Low body weight, but stable.   HENT:     Head: Normocephalic and atraumatic.     Mouth/Throat:     Mouth: Mucous membranes are moist.  Eyes:       Extraocular Movements: Extraocular movements intact.     Conjunctiva/sclera: Conjunctivae normal.     Pupils: Pupils are equal, round, and reactive to light.  Cardiovascular:     Rate and Rhythm: Normal rate. Rhythm irregular.     Heart sounds: No murmur heard.   Pulmonary:     Breath sounds: No rales.  Abdominal:     Palpations: Abdomen is soft.     Tenderness: There is no abdominal tenderness. There is no right CVA tenderness or left CVA tenderness.  Musculoskeletal:     Cervical back: Normal range of motion and neck supple.     Right lower leg: No edema.     Left lower leg: No edema.  Skin:    General: Skin is warm and dry.  Neurological:     General: No focal deficit present.  Mental Status: She is alert. Mental status is at baseline.     Coordination: Coordination abnormal.     Gait: Gait abnormal.     Comments: Tremor in fingers are worsened. Oriented to self.   Psychiatric:     Comments: Confused. Appears anxious.      Labs reviewed: Recent Labs    05/13/19 0000 05/21/19 0000 10/28/19 0000  NA 142 144 144  K 3.8 3.7 4.9  CL 102 103 105  CO2 33 33 33*  BUN 28* 26* 29*  CREATININE 1.1 1.1 1.1  CALCIUM 9.5 9.9 9.9   Recent Labs    05/13/19 0000 05/21/19 0000 10/28/19 0000  AST '14 13 15  '$ ALT 6* 5* 6*  ALKPHOS 43 41 43  PROT 6.3* 6.1*  --   ALBUMIN 3.8 3.7 3.5   Recent Labs    05/13/19 0000 05/21/19 0000 10/28/19 0000  WBC 8.1 9.1 8.1  NEUTROABS  --  5,651  --   HGB 12.5 12.0 11.4*  HCT 37 36 34*  PLT 204 183 186   Lab Results  Component Value Date   TSH 0.76 05/21/2019   No results found for: HGBA1C No results found for: CHOL, HDL, LDLCALC, LDLDIRECT, TRIG, CHOLHDL  Significant Diagnostic Results in last 30 days:  No results found.  Assessment/Plan Chronic a-fib (HCC) Heart rate is in control.   Mixed Alzheimer's and vascular dementia ([place]) Advanced, pacing, anxious at times, restlessness, continue SNF [facility] for supportive care,  continue Hospice service.   Slow transit constipation Stable, continue Senokot S II qd.   Tremor, essential Seems worse, continue Propranolol '60mg'$ q qd to eliminate possible contributory factors of falling at dose of '80mg'$  qd. Supportive care  Depression, recurrent ([place]) Periodically pacing, restlessness, continue Mirtazapine '15mg'$  qd, Lorazepam 0.'5mg'$  bid. Will update CBC/diff, CMP/eGFR  Lower back pain Pain is controlled, continue Tylenol '1000mg'$  bid.      Family/ staff Communication: plan of care reviewed with the patient and charge nurse.   Labs/tests ordered:  CBC/diff, CMP/eGFR  Time spend 25 minutes.

## 2020-01-28 NOTE — Assessment & Plan Note (Signed)
Pain is controlled, continue Tylenol 1000mg  bid.

## 2020-01-28 NOTE — Assessment & Plan Note (Signed)
Seems worse, continue Propranolol 60mg q qd to eliminate possible contributory factors of falling at dose of 80mg  qd. Supportive care

## 2020-01-28 NOTE — Assessment & Plan Note (Signed)
Stable, continue Senokot S II qd  

## 2020-01-28 NOTE — Assessment & Plan Note (Addendum)
Periodically pacing, restlessness, continue Mirtazapine 26m qd, Lorazepam 0.592mbid. Will update CBC/diff, CMP/eGFR

## 2020-01-29 ENCOUNTER — Encounter: Payer: Self-pay | Admitting: Nurse Practitioner

## 2020-01-29 DIAGNOSIS — I482 Chronic atrial fibrillation, unspecified: Secondary | ICD-10-CM | POA: Diagnosis not present

## 2020-01-29 LAB — HEPATIC FUNCTION PANEL
ALT: 7 (ref 7–35)
AST: 12 — AB (ref 13–35)
Alkaline Phosphatase: 44 (ref 25–125)
Bilirubin, Total: 0.7

## 2020-01-29 LAB — CBC AND DIFFERENTIAL
HCT: 36 (ref 36–46)
Hemoglobin: 12.1 (ref 12.0–16.0)
Neutrophils Absolute: 8342
Platelets: 144 — AB (ref 150–399)
WBC: 11.9

## 2020-01-29 LAB — BASIC METABOLIC PANEL
BUN: 41 — AB (ref 4–21)
CO2: 34 — AB (ref 13–22)
Chloride: 103 (ref 99–108)
Creatinine: 1.2 — AB (ref 0.5–1.1)
Glucose: 90
Potassium: 4.3 (ref 3.4–5.3)
Sodium: 145 (ref 137–147)

## 2020-01-29 LAB — COMPREHENSIVE METABOLIC PANEL
Albumin: 3.6 (ref 3.5–5.0)
Calcium: 9.7 (ref 8.7–10.7)
GFR calc Af Amer: 44
GFR calc non Af Amer: 38
Globulin: 2.4

## 2020-02-02 ENCOUNTER — Encounter: Payer: Self-pay | Admitting: Nurse Practitioner

## 2020-02-02 ENCOUNTER — Non-Acute Institutional Stay (SKILLED_NURSING_FACILITY): Payer: Medicare Other | Admitting: Nurse Practitioner

## 2020-02-02 DIAGNOSIS — F339 Major depressive disorder, recurrent, unspecified: Secondary | ICD-10-CM

## 2020-02-02 DIAGNOSIS — R296 Repeated falls: Secondary | ICD-10-CM

## 2020-02-02 DIAGNOSIS — G309 Alzheimer's disease, unspecified: Secondary | ICD-10-CM

## 2020-02-02 DIAGNOSIS — R269 Unspecified abnormalities of gait and mobility: Secondary | ICD-10-CM

## 2020-02-02 DIAGNOSIS — G250 Essential tremor: Secondary | ICD-10-CM

## 2020-02-02 DIAGNOSIS — F0280 Dementia in other diseases classified elsewhere without behavioral disturbance: Secondary | ICD-10-CM

## 2020-02-02 DIAGNOSIS — F0150 Vascular dementia without behavioral disturbance: Secondary | ICD-10-CM

## 2020-02-02 NOTE — Progress Notes (Signed)
Location:   SNF [facility] Room Number: 31Rm 31 Place of Service:  SNF (31)[facility]  Provider: [facility] Tennille Montelongo NP  [name], MD  Patient Care Team: [name], MD as PCP - General (Internal Medicine) Nahser, Wonda Cheng, MD as PCP - Cardiology (Cardiology) Zakariya Knickerbocker X, NP as Nurse Practitioner (Internal Medicine)  Extended Emergency Contact Information Primary Emergency Contact: Morgan,Tom Address: [address]          [place], [place] [address] Johnnette Litter of [place] Phone: [phone] Relation: Friend Secondary Emergency Contact: [place] of [place] Phone: [phone] Relation: Niece  Code Status: DNR Goals of care: Advanced Directive information Advanced Directives 02/02/2020  Does Patient Have a Medical Advance Directive? Yes  Type of Advance Directive Living will  Does patient want to make changes to medical advance directive? No - Patient declined  Copy of [place] in Chart? -  Would patient like information on creating a medical advance directive? -  Pre-existing out of facility DNR order (yellow form or pink MOST form) -     Chief Complaint  Patient presents with  . Acute Visit    Fall    HPI:  Pt is a 86 y.o. female seen today for an acute visit for falls, most recent events 01/30/20 assisted the patient to floor when the patient lost balance,  01/31/20 unwitnessed fall.   Advanced dementia, no safety awareness  Worsened gait, frailty, wobbly gait  Essential tremor, worse since Propranolol was decreased in attempting reduce risk for falling  Anxiety/depression persisted, regardless Mirtazapine 15mg  qd, Lorazepam 0.5mg  bid.    Past Medical History:  Diagnosis Date  . Anticoagulant long-term use   . Atrial fibrillation ([place])   . CHF (congestive heart failure) ([place])   . Chronic anticoagulation   . Hypertension   . Raynaud phenomenon   . Tremor, essential    Past  Surgical History:  Procedure Laterality Date  . CHOLECYSTECTOMY    . TONSILLECTOMY      Allergies  Allergen Reactions  . Fosamax [Alendronate]   . Other Nausea And Vomiting    Green pepper    Allergies as of 02/02/2020      Reactions   Fosamax [alendronate]    Other Nausea And Vomiting   Green pepper      Medication List       Accurate as of February 02, 2020 11:59 PM. If you have any questions, ask your nurse or doctor.        STOP taking these medications   Artificial Tears 0.2-0.2-1 % Soln Generic drug: Glycerin-Hypromellose-PEG 400 Stopped by: Mazen Marcin X Serjio Deupree, NP   RESOURCE 2.0 PO Stopped by: Gurdeep Keesey X Cahterine Heinzel, NP     TAKE these medications   acetaminophen 500 MG tablet Commonly known as: TYLENOL Take 500 mg by mouth every 8 (eight) hours as needed for mild pain or moderate pain.   acetaminophen 500 MG tablet Commonly known as: TYLENOL Take 1,000 mg by mouth 2 (two) times daily. Not to exceed 3,00 mg daily.   Caltrate 600+D Plus Minerals 600-800 MG-UNIT Chew Chew 1 tablet by mouth 2 (two) times a day.   LORazepam 0.5 MG tablet Commonly known as: ATIVAN Take 0.5 mg by mouth every 6 (six) hours as needed for anxiety.   mirtazapine 15 MG tablet Commonly known as: REMERON Take 15 mg by mouth at bedtime. 15 mg po, oral, At Bedtime, INCREASE REMERON 15 MG  PO QHS What changed: Another medication with the same name was removed. Continue taking this medication, and follow the directions you see here. Changed by: Oziel Beitler X Mikita Lesmeister, NP   NON FORMULARY Regular, Nectar Thick, Mech Soft Special Instructions: Regular diet with Nectar thick liquids by straw. 2 handled cup and divided plate with built-up utensils for meals.   propranolol ER 60 MG 24 hr capsule Commonly known as: INDERAL LA Take 60 mg by mouth every other day.   sennosides-docusate sodium 8.6-50 MG tablet Commonly known as: SENOKOT-S Take 2 tablets by mouth at bedtime.       Review of Systems  Constitutional:  Negative for appetite change, fatigue and fever.  HENT: Positive for hearing loss. Negative for congestion and voice change.   Eyes: Negative for visual disturbance.  Respiratory: Negative for shortness of breath.   Cardiovascular: Negative for leg swelling.  Gastrointestinal: Negative for abdominal pain and constipation.  Genitourinary: Negative for difficulty urinating, dysuria and urgency.  Musculoskeletal: Positive for arthralgias, back pain and gait problem.  Skin: Negative for color change.  Neurological: Positive for tremors. Negative for facial asymmetry, speech difficulty and light-headedness.       Dementia  Psychiatric/Behavioral: Positive for agitation, behavioral problems and confusion. The patient is nervous/anxious.     Immunization History  Administered Date(s) Administered  . Influenza Whole 05/09/2018  . Influenza, High Dose Seasonal PF 05/09/2019  . Influenza-Unspecified 05/28/2017  . Moderna SARS-COVID-2 Vaccination 08/09/2019, 09/06/2019  . Pneumococcal Conjugate-13 07/13/2017  . Pneumococcal Polysaccharide-23 04/07/2002  . Tdap 07/13/2017, 10/08/2017   Pertinent  Health Maintenance Due  Topic Date Due  . INFLUENZA VACCINE  03/07/2020  . DEXA SCAN  Completed  . PNA vac Low Risk Adult  Completed   Fall Risk  03/10/2019 10/05/2017  Falls in the past year? (No Data) No  Comment Emmi Telephone Survey: data to providers prior to load -  Number falls in past yr: (No Data) -  Comment Emmi Telephone Survey Actual Response =  -   Functional Status Survey:    Vitals:   02/02/20 1500  BP: 108/72  Pulse: 60  Resp: 18  Temp: (!) 97.3 F (36.3 C)  SpO2: 92%  Weight: 93 lb 14.4 oz (42.6 kg)  Height: 5\' 4"  (1.626 m)   Body mass index is 16.12 kg/m. Physical Exam Vitals and nursing note reviewed.  Constitutional:      Comments: frail  HENT:     Head: Normocephalic and atraumatic.     Mouth/Throat:     Mouth: Mucous membranes are moist.  Eyes:      Extraocular Movements: Extraocular movements intact.     Conjunctiva/sclera: Conjunctivae normal.     Pupils: Pupils are equal, round, and reactive to light.  Cardiovascular:     Rate and Rhythm: Normal rate. Rhythm irregular.     Heart sounds: No murmur heard.   Pulmonary:     Breath sounds: No rales.  Abdominal:     Palpations: Abdomen is soft.     Tenderness: There is no abdominal tenderness. There is no right CVA tenderness or left CVA tenderness.  Musculoskeletal:     Cervical back: Normal range of motion and neck supple.     Right lower leg: No edema.     Left lower leg: No edema.  Skin:    General: Skin is warm and dry.  Neurological:     General: No focal deficit present.     Mental Status: She is alert. Mental status  is at baseline.     Coordination: Coordination abnormal.     Gait: Gait abnormal.     Comments: Tremor in fingers are worsened. Oriented to self.   Psychiatric:     Comments: Confused. Appears anxious.      Labs reviewed: Recent Labs    05/13/19 0000 05/21/19 0000 10/28/19 0000  NA 142 144 144  K 3.8 3.7 4.9  CL 102 103 105  CO2 33 33 33*  BUN 28* 26* 29*  CREATININE 1.1 1.1 1.1  CALCIUM 9.5 9.9 9.9   Recent Labs    05/13/19 0000 05/21/19 0000 10/28/19 0000  AST 14 13 15   ALT 6* 5* 6*  ALKPHOS 43 41 43  PROT 6.3* 6.1*  --   ALBUMIN 3.8 3.7 3.5   Recent Labs    05/13/19 0000 05/21/19 0000 10/28/19 0000  WBC 8.1 9.1 8.1  NEUTROABS  --  5,651  --   HGB 12.5 12.0 11.4*  HCT 37 36 34*  PLT 204 183 186   Lab Results  Component Value Date   TSH 0.76 05/21/2019   No results found for: HGBA1C No results found for: CHOL, HDL, LDLCALC, LDLDIRECT, TRIG, CHOLHDL  Significant Diagnostic Results in last 30 days:  No results found.  Assessment/Plan: Frequent falls Persisted issues, due to no safety awareness, worsening gait/frailty, close supervision/assistance for safety, GDR of psychotropic meds.   Gait abnormality Continue  worsening, wobbly, but the patient attempts ambulating with walker w/o assistance.   Tremor, essential Worsening, adoptive utensils use, continue Propranolol.   Mixed Alzheimer's and vascular dementia (HCC) Advanced, SNF [facility] for safety, care assistance.   Depression, recurrent (HCC) Anxiety, depression, persisted regardless Mirtazapine, Lorazepam, R>B presently, will dc Mirtazapine, change Lorazepam 0.5 mg q6h prn.     Family/ staff Communication: plan of care reviewed with the patient and charge nurse.   Labs/tests ordered:  None  Time spend 25 minutes.

## 2020-02-02 NOTE — Assessment & Plan Note (Signed)
Advanced, SNF [facility] for safety, care assistance.

## 2020-02-02 NOTE — Assessment & Plan Note (Signed)
Persisted issues, due to no safety awareness, worsening gait/frailty, close supervision/assistance for safety, GDR of psychotropic meds.

## 2020-02-02 NOTE — Assessment & Plan Note (Signed)
Anxiety, depression, persisted regardless Mirtazapine, Lorazepam, R>B presently, will dc Mirtazapine, change Lorazepam 0.5 mg q6h prn.

## 2020-02-02 NOTE — Assessment & Plan Note (Signed)
Continue worsening, wobbly, but the patient attempts ambulating with walker w/o assistance.

## 2020-02-02 NOTE — Assessment & Plan Note (Signed)
Worsening, adoptive utensils use, continue Propranolol.

## 2020-02-03 ENCOUNTER — Encounter: Payer: Self-pay | Admitting: Nurse Practitioner

## 2020-02-05 DIAGNOSIS — N183 Chronic kidney disease, stage 3 unspecified: Secondary | ICD-10-CM | POA: Diagnosis not present

## 2020-02-05 DIAGNOSIS — I4891 Unspecified atrial fibrillation: Secondary | ICD-10-CM | POA: Diagnosis not present

## 2020-02-05 DIAGNOSIS — I509 Heart failure, unspecified: Secondary | ICD-10-CM | POA: Diagnosis not present

## 2020-02-05 DIAGNOSIS — S0181XD Laceration without foreign body of other part of head, subsequent encounter: Secondary | ICD-10-CM | POA: Diagnosis not present

## 2020-02-05 DIAGNOSIS — F0280 Dementia in other diseases classified elsewhere without behavioral disturbance: Secondary | ICD-10-CM | POA: Diagnosis not present

## 2020-02-05 DIAGNOSIS — Z947 Corneal transplant status: Secondary | ICD-10-CM | POA: Diagnosis not present

## 2020-02-05 DIAGNOSIS — R32 Unspecified urinary incontinence: Secondary | ICD-10-CM | POA: Diagnosis not present

## 2020-02-05 DIAGNOSIS — R296 Repeated falls: Secondary | ICD-10-CM | POA: Diagnosis not present

## 2020-02-05 DIAGNOSIS — E46 Unspecified protein-calorie malnutrition: Secondary | ICD-10-CM | POA: Diagnosis not present

## 2020-02-05 DIAGNOSIS — S22080D Wedge compression fracture of T11-T12 vertebra, subsequent encounter for fracture with routine healing: Secondary | ICD-10-CM | POA: Diagnosis not present

## 2020-02-05 DIAGNOSIS — G250 Essential tremor: Secondary | ICD-10-CM | POA: Diagnosis not present

## 2020-02-05 DIAGNOSIS — H01003 Unspecified blepharitis right eye, unspecified eyelid: Secondary | ICD-10-CM | POA: Diagnosis not present

## 2020-02-05 DIAGNOSIS — D631 Anemia in chronic kidney disease: Secondary | ICD-10-CM | POA: Diagnosis not present

## 2020-02-05 DIAGNOSIS — I7300 Raynaud's syndrome without gangrene: Secondary | ICD-10-CM | POA: Diagnosis not present

## 2020-02-05 DIAGNOSIS — Z87440 Personal history of urinary (tract) infections: Secondary | ICD-10-CM | POA: Diagnosis not present

## 2020-02-05 DIAGNOSIS — Z681 Body mass index (BMI) 19 or less, adult: Secondary | ICD-10-CM | POA: Diagnosis not present

## 2020-02-05 DIAGNOSIS — R159 Full incontinence of feces: Secondary | ICD-10-CM | POA: Diagnosis not present

## 2020-02-05 DIAGNOSIS — G309 Alzheimer's disease, unspecified: Secondary | ICD-10-CM | POA: Diagnosis not present

## 2020-02-05 DIAGNOSIS — Z741 Need for assistance with personal care: Secondary | ICD-10-CM | POA: Diagnosis not present

## 2020-02-05 DIAGNOSIS — I130 Hypertensive heart and chronic kidney disease with heart failure and stage 1 through stage 4 chronic kidney disease, or unspecified chronic kidney disease: Secondary | ICD-10-CM | POA: Diagnosis not present

## 2020-02-05 DIAGNOSIS — F418 Other specified anxiety disorders: Secondary | ICD-10-CM | POA: Diagnosis not present

## 2020-02-08 DIAGNOSIS — N183 Chronic kidney disease, stage 3 unspecified: Secondary | ICD-10-CM | POA: Diagnosis not present

## 2020-02-08 DIAGNOSIS — R296 Repeated falls: Secondary | ICD-10-CM | POA: Diagnosis not present

## 2020-02-08 DIAGNOSIS — I130 Hypertensive heart and chronic kidney disease with heart failure and stage 1 through stage 4 chronic kidney disease, or unspecified chronic kidney disease: Secondary | ICD-10-CM | POA: Diagnosis not present

## 2020-02-08 DIAGNOSIS — G309 Alzheimer's disease, unspecified: Secondary | ICD-10-CM | POA: Diagnosis not present

## 2020-02-08 DIAGNOSIS — F0280 Dementia in other diseases classified elsewhere without behavioral disturbance: Secondary | ICD-10-CM | POA: Diagnosis not present

## 2020-02-08 DIAGNOSIS — I509 Heart failure, unspecified: Secondary | ICD-10-CM | POA: Diagnosis not present

## 2020-02-09 DIAGNOSIS — I130 Hypertensive heart and chronic kidney disease with heart failure and stage 1 through stage 4 chronic kidney disease, or unspecified chronic kidney disease: Secondary | ICD-10-CM | POA: Diagnosis not present

## 2020-02-09 DIAGNOSIS — N183 Chronic kidney disease, stage 3 unspecified: Secondary | ICD-10-CM | POA: Diagnosis not present

## 2020-02-09 DIAGNOSIS — R296 Repeated falls: Secondary | ICD-10-CM | POA: Diagnosis not present

## 2020-02-09 DIAGNOSIS — I509 Heart failure, unspecified: Secondary | ICD-10-CM | POA: Diagnosis not present

## 2020-02-09 DIAGNOSIS — G309 Alzheimer's disease, unspecified: Secondary | ICD-10-CM | POA: Diagnosis not present

## 2020-02-09 DIAGNOSIS — F0280 Dementia in other diseases classified elsewhere without behavioral disturbance: Secondary | ICD-10-CM | POA: Diagnosis not present

## 2020-02-12 DIAGNOSIS — N183 Chronic kidney disease, stage 3 unspecified: Secondary | ICD-10-CM | POA: Diagnosis not present

## 2020-02-12 DIAGNOSIS — I509 Heart failure, unspecified: Secondary | ICD-10-CM | POA: Diagnosis not present

## 2020-02-12 DIAGNOSIS — I130 Hypertensive heart and chronic kidney disease with heart failure and stage 1 through stage 4 chronic kidney disease, or unspecified chronic kidney disease: Secondary | ICD-10-CM | POA: Diagnosis not present

## 2020-02-12 DIAGNOSIS — R296 Repeated falls: Secondary | ICD-10-CM | POA: Diagnosis not present

## 2020-02-12 DIAGNOSIS — G309 Alzheimer's disease, unspecified: Secondary | ICD-10-CM | POA: Diagnosis not present

## 2020-02-12 DIAGNOSIS — F0280 Dementia in other diseases classified elsewhere without behavioral disturbance: Secondary | ICD-10-CM | POA: Diagnosis not present

## 2020-02-15 DIAGNOSIS — N183 Chronic kidney disease, stage 3 unspecified: Secondary | ICD-10-CM | POA: Diagnosis not present

## 2020-02-15 DIAGNOSIS — R296 Repeated falls: Secondary | ICD-10-CM | POA: Diagnosis not present

## 2020-02-15 DIAGNOSIS — G309 Alzheimer's disease, unspecified: Secondary | ICD-10-CM | POA: Diagnosis not present

## 2020-02-15 DIAGNOSIS — F0280 Dementia in other diseases classified elsewhere without behavioral disturbance: Secondary | ICD-10-CM | POA: Diagnosis not present

## 2020-02-15 DIAGNOSIS — I130 Hypertensive heart and chronic kidney disease with heart failure and stage 1 through stage 4 chronic kidney disease, or unspecified chronic kidney disease: Secondary | ICD-10-CM | POA: Diagnosis not present

## 2020-02-15 DIAGNOSIS — I509 Heart failure, unspecified: Secondary | ICD-10-CM | POA: Diagnosis not present

## 2020-02-19 DIAGNOSIS — G309 Alzheimer's disease, unspecified: Secondary | ICD-10-CM | POA: Diagnosis not present

## 2020-02-19 DIAGNOSIS — I130 Hypertensive heart and chronic kidney disease with heart failure and stage 1 through stage 4 chronic kidney disease, or unspecified chronic kidney disease: Secondary | ICD-10-CM | POA: Diagnosis not present

## 2020-02-19 DIAGNOSIS — R296 Repeated falls: Secondary | ICD-10-CM | POA: Diagnosis not present

## 2020-02-19 DIAGNOSIS — N183 Chronic kidney disease, stage 3 unspecified: Secondary | ICD-10-CM | POA: Diagnosis not present

## 2020-02-19 DIAGNOSIS — F0280 Dementia in other diseases classified elsewhere without behavioral disturbance: Secondary | ICD-10-CM | POA: Diagnosis not present

## 2020-02-19 DIAGNOSIS — I509 Heart failure, unspecified: Secondary | ICD-10-CM | POA: Diagnosis not present

## 2020-02-20 DIAGNOSIS — N183 Chronic kidney disease, stage 3 unspecified: Secondary | ICD-10-CM | POA: Diagnosis not present

## 2020-02-20 DIAGNOSIS — F0280 Dementia in other diseases classified elsewhere without behavioral disturbance: Secondary | ICD-10-CM | POA: Diagnosis not present

## 2020-02-20 DIAGNOSIS — I509 Heart failure, unspecified: Secondary | ICD-10-CM | POA: Diagnosis not present

## 2020-02-20 DIAGNOSIS — R296 Repeated falls: Secondary | ICD-10-CM | POA: Diagnosis not present

## 2020-02-20 DIAGNOSIS — G309 Alzheimer's disease, unspecified: Secondary | ICD-10-CM | POA: Diagnosis not present

## 2020-02-20 DIAGNOSIS — I130 Hypertensive heart and chronic kidney disease with heart failure and stage 1 through stage 4 chronic kidney disease, or unspecified chronic kidney disease: Secondary | ICD-10-CM | POA: Diagnosis not present

## 2020-02-22 DIAGNOSIS — F0280 Dementia in other diseases classified elsewhere without behavioral disturbance: Secondary | ICD-10-CM | POA: Diagnosis not present

## 2020-02-22 DIAGNOSIS — R296 Repeated falls: Secondary | ICD-10-CM | POA: Diagnosis not present

## 2020-02-22 DIAGNOSIS — N183 Chronic kidney disease, stage 3 unspecified: Secondary | ICD-10-CM | POA: Diagnosis not present

## 2020-02-22 DIAGNOSIS — G309 Alzheimer's disease, unspecified: Secondary | ICD-10-CM | POA: Diagnosis not present

## 2020-02-22 DIAGNOSIS — I509 Heart failure, unspecified: Secondary | ICD-10-CM | POA: Diagnosis not present

## 2020-02-22 DIAGNOSIS — I130 Hypertensive heart and chronic kidney disease with heart failure and stage 1 through stage 4 chronic kidney disease, or unspecified chronic kidney disease: Secondary | ICD-10-CM | POA: Diagnosis not present

## 2020-02-26 DIAGNOSIS — N183 Chronic kidney disease, stage 3 unspecified: Secondary | ICD-10-CM | POA: Diagnosis not present

## 2020-02-26 DIAGNOSIS — G309 Alzheimer's disease, unspecified: Secondary | ICD-10-CM | POA: Diagnosis not present

## 2020-02-26 DIAGNOSIS — I130 Hypertensive heart and chronic kidney disease with heart failure and stage 1 through stage 4 chronic kidney disease, or unspecified chronic kidney disease: Secondary | ICD-10-CM | POA: Diagnosis not present

## 2020-02-26 DIAGNOSIS — R296 Repeated falls: Secondary | ICD-10-CM | POA: Diagnosis not present

## 2020-02-26 DIAGNOSIS — F0280 Dementia in other diseases classified elsewhere without behavioral disturbance: Secondary | ICD-10-CM | POA: Diagnosis not present

## 2020-02-26 DIAGNOSIS — I509 Heart failure, unspecified: Secondary | ICD-10-CM | POA: Diagnosis not present

## 2020-02-29 DIAGNOSIS — G309 Alzheimer's disease, unspecified: Secondary | ICD-10-CM | POA: Diagnosis not present

## 2020-02-29 DIAGNOSIS — R296 Repeated falls: Secondary | ICD-10-CM | POA: Diagnosis not present

## 2020-02-29 DIAGNOSIS — I130 Hypertensive heart and chronic kidney disease with heart failure and stage 1 through stage 4 chronic kidney disease, or unspecified chronic kidney disease: Secondary | ICD-10-CM | POA: Diagnosis not present

## 2020-02-29 DIAGNOSIS — F0280 Dementia in other diseases classified elsewhere without behavioral disturbance: Secondary | ICD-10-CM | POA: Diagnosis not present

## 2020-02-29 DIAGNOSIS — I509 Heart failure, unspecified: Secondary | ICD-10-CM | POA: Diagnosis not present

## 2020-02-29 DIAGNOSIS — N183 Chronic kidney disease, stage 3 unspecified: Secondary | ICD-10-CM | POA: Diagnosis not present

## 2020-03-01 ENCOUNTER — Non-Acute Institutional Stay (SKILLED_NURSING_FACILITY): Payer: Medicare Other | Admitting: Nurse Practitioner

## 2020-03-01 ENCOUNTER — Encounter: Payer: Self-pay | Admitting: Nurse Practitioner

## 2020-03-01 DIAGNOSIS — G309 Alzheimer's disease, unspecified: Secondary | ICD-10-CM

## 2020-03-01 DIAGNOSIS — R269 Unspecified abnormalities of gait and mobility: Secondary | ICD-10-CM | POA: Diagnosis not present

## 2020-03-01 DIAGNOSIS — F339 Major depressive disorder, recurrent, unspecified: Secondary | ICD-10-CM

## 2020-03-01 DIAGNOSIS — F0280 Dementia in other diseases classified elsewhere without behavioral disturbance: Secondary | ICD-10-CM | POA: Diagnosis not present

## 2020-03-01 DIAGNOSIS — R296 Repeated falls: Secondary | ICD-10-CM

## 2020-03-01 DIAGNOSIS — S0101XA Laceration without foreign body of scalp, initial encounter: Secondary | ICD-10-CM

## 2020-03-01 DIAGNOSIS — K5901 Slow transit constipation: Secondary | ICD-10-CM | POA: Diagnosis not present

## 2020-03-01 DIAGNOSIS — M545 Low back pain, unspecified: Secondary | ICD-10-CM

## 2020-03-01 DIAGNOSIS — G250 Essential tremor: Secondary | ICD-10-CM | POA: Diagnosis not present

## 2020-03-01 DIAGNOSIS — I482 Chronic atrial fibrillation, unspecified: Secondary | ICD-10-CM

## 2020-03-01 DIAGNOSIS — F0150 Vascular dementia without behavioral disturbance: Secondary | ICD-10-CM

## 2020-03-01 NOTE — Assessment & Plan Note (Signed)
Stable, continue Senokot S 

## 2020-03-01 NOTE — Assessment & Plan Note (Signed)
Advanced, continue supportive measures in SNF [facility], close supervision/assistance for safety since the patient is impulsive/no safety awareness.

## 2020-03-01 NOTE — Assessment & Plan Note (Signed)
Persisted, still able to feed self, continue Propranolol.

## 2020-03-01 NOTE — Assessment & Plan Note (Signed)
Pain is controlled, continue Tylenol.  

## 2020-03-01 NOTE — Assessment & Plan Note (Signed)
Heart rate is in control.  

## 2020-03-01 NOTE — Assessment & Plan Note (Signed)
Stable, continue Mirtazapine, Lorazepam.

## 2020-03-01 NOTE — Assessment & Plan Note (Signed)
Wobbly, continue ambulating with walker, close supervision/assistance needed.

## 2020-03-01 NOTE — Assessment & Plan Note (Signed)
reported fall 02/27/20 when the patient was found sitting upward on room floor, resulted right side scalp laceration, closure with steri strips intact. No decreased ROM or deformity of limbs or pain with walking.  Lack of safety awareness, increased frailty are contributory, close supervision/assistance for safety

## 2020-03-01 NOTE — Progress Notes (Signed)
Location:   [facility] Room Number: 91 Place of Service:  SNF (31) Provider: [facility] Janki Dike NP  Mahlon Gammon, MD  Patient Care Team: Mahlon Gammon, MD as PCP - General (Internal Medicine) Nahser, Deloris Ping, MD as PCP - Cardiology (Cardiology) Sarah Zerby X, NP as Nurse Practitioner (Internal Medicine)  Extended Emergency Contact Information Primary Emergency Contact: Morgan,Tom Address: [address]          [place], [place] [address] [place] of [place] Home Phone: [phone] Relation: Friend Secondary Emergency Contact: Brandy Hale [place] Home Phone: [phone] Relation: Niece  Code Status: DNR Goals of care: Advanced Directive information Advanced Directives 03/01/2020  Does Patient Have a Medical Advance Directive? Yes  Type of Advance Directive Healthcare Power of Attorney  Does patient want to make changes to medical advance directive? No - Patient declined  Copy of Healthcare Power of Attorney in Chart? Yes - validated most recent copy scanned in chart (See row information)  Would patient like information on creating a medical advance directive? -  Pre-existing out of facility DNR order (yellow form or pink MOST form) -     Chief Complaint  Patient presents with  . Acute Visit    Fall    HPI:  Pt is a 86 y.o. female seen today for an acute visit for reported fall 02/27/20 when the patient was found sitting upward on room floor, resulted right side scalp laceration, closure with steri strips intact. No decreased ROM or deformity of limbs or pain with walking.   Advanced dementia, no safety awareness             Worsened gait, frailty, wobbly gait             Essential tremor, takes  Propranolol             Anxiety/depression persisted, regardless Mirtazapine 15mg  qd, Lorazepam 0.5mg  bid.   Chronic lower back pain, takes Tylenol 1000mg  bid  Constipation, takes Senokot S   Past Medical History:  Diagnosis Date  .  Anticoagulant long-term use   . Atrial fibrillation (HCC)   . CHF (congestive heart failure) (HCC)   . Chronic anticoagulation   . Hypertension   . Raynaud phenomenon   . Tremor, essential    Past Surgical History:  Procedure Laterality Date  . CHOLECYSTECTOMY    . TONSILLECTOMY      Allergies  Allergen Reactions  . Fosamax [Alendronate]   . Other Nausea And Vomiting    Green pepper    Allergies as of 03/01/2020      Reactions   Fosamax [alendronate]    Other Nausea And Vomiting   Green pepper      Medication List       Accurate as of March 01, 2020 11:59 PM. If you have any questions, ask your nurse or doctor.        STOP taking these medications   mirtazapine 15 MG tablet Commonly known as: REMERON Stopped by: Zaleah Ternes X Hansel Devan, NP     TAKE these medications   acetaminophen 500 MG tablet Commonly known as: TYLENOL Take 500 mg by mouth every 8 (eight) hours as needed for mild pain or moderate pain.   acetaminophen 500 MG tablet Commonly known as: TYLENOL Take 1,000 mg by mouth 2 (two) times daily. Not to exceed 3,00 mg daily.   Caltrate 600+D Plus Minerals 600-800 MG-UNIT Chew Chew 1 tablet by mouth 2 (two) times a  day.   LORazepam 0.5 MG tablet Commonly known as: ATIVAN Take 0.5 mg by mouth every 12 (twelve) hours as needed for anxiety.   NON FORMULARY Regular, Nectar Thick, Mech Soft Special Instructions: Regular diet with Nectar thick liquids by straw. 2 handled cup and divided plate with built-up utensils for meals.   propranolol ER 60 MG 24 hr capsule Commonly known as: INDERAL LA Take 60 mg by mouth every other day.   sennosides-docusate sodium 8.6-50 MG tablet Commonly known as: SENOKOT-S Take 2 tablets by mouth at bedtime.       Review of Systems  Constitutional: Negative for activity change, appetite change and fever.  HENT: Positive for hearing loss. Negative for congestion and voice change.   Eyes: Negative for visual disturbance.    Respiratory: Negative for shortness of breath.   Cardiovascular: Negative for leg swelling.  Gastrointestinal: Negative for abdominal pain and constipation.  Genitourinary: Negative for difficulty urinating, dysuria and urgency.  Musculoskeletal: Positive for arthralgias, back pain and gait problem.  Skin: Positive for wound. Negative for color change.  Neurological: Positive for tremors. Negative for dizziness, speech difficulty and headaches.       Dementia  Psychiatric/Behavioral: Positive for agitation, behavioral problems, confusion and sleep disturbance. The patient is nervous/anxious.     Immunization History  Administered Date(s) Administered  . Influenza Whole 05/09/2018  . Influenza, High Dose Seasonal PF 05/09/2019  . Influenza-Unspecified 05/28/2017  . Moderna SARS-COVID-2 Vaccination 08/09/2019, 09/06/2019  . Pneumococcal Conjugate-13 07/13/2017  . Pneumococcal Polysaccharide-23 04/07/2002  . Tdap 07/13/2017, 10/08/2017   Pertinent  Health Maintenance Due  Topic Date Due  . INFLUENZA VACCINE  03/07/2020  . DEXA SCAN  Completed  . PNA vac Low Risk Adult  Completed   Fall Risk  03/10/2019 10/05/2017  Falls in the past year? (No Data) No  Comment Emmi Telephone Survey: data to providers prior to load -  Number falls in past yr: (No Data) -  Comment Emmi Telephone Survey Actual Response =  -   Functional Status Survey:    Vitals:   03/01/20 1344  BP: (!) 148/62  Pulse: 64  Resp: 22  Temp: 98 F (36.7 C)  SpO2: 93%  Weight: 98 lb 6.4 oz (44.6 kg)  Height: 5\' 4"  (1.626 m)   Body mass index is 16.89 kg/m. Physical Exam Vitals and nursing note reviewed.  Constitutional:      Appearance: Normal appearance.     Comments: frail  HENT:     Head: Normocephalic and atraumatic.     Mouth/Throat:     Mouth: Mucous membranes are moist.  Eyes:     Extraocular Movements: Extraocular movements intact.     Conjunctiva/sclera: Conjunctivae normal.     Pupils: Pupils  are equal, round, and reactive to light.  Cardiovascular:     Rate and Rhythm: Normal rate. Rhythm irregular.     Heart sounds: No murmur heard.   Pulmonary:     Breath sounds: No rales.  Abdominal:     Palpations: Abdomen is soft.     Tenderness: There is no abdominal tenderness. There is no right CVA tenderness or left CVA tenderness.  Musculoskeletal:     Cervical back: Normal range of motion and neck supple.     Right lower leg: No edema.     Left lower leg: No edema.  Skin:    General: Skin is warm and dry.     Findings: Bruising present.     Comments: Right parietal  scalp laceration is well approximated with steri strips, no active bleeding or s/s of infection.   Neurological:     General: No focal deficit present.     Mental Status: She is alert. Mental status is at baseline.     Coordination: Coordination abnormal.     Gait: Gait abnormal.     Comments: Tremor in fingers are worsened. Oriented to self.   Psychiatric:     Comments: Confused.      Labs reviewed: Recent Labs    05/21/19 0000 10/28/19 0000 01/29/20 0000  NA 144 144 145  K 3.7 4.9 4.3  CL 103 105 103  CO2 33 33* 34*  BUN 26* 29* 41*  CREATININE 1.1 1.1 1.2*  CALCIUM 9.9 9.9 9.7   Recent Labs    05/13/19 0000 05/13/19 0000 05/21/19 0000 10/28/19 0000 01/29/20 0000  AST 14   < > 13 15 12*  ALT 6*   < > 5* 6* 7  ALKPHOS 43   < > 41 43 44  PROT 6.3*  --  6.1*  --   --   ALBUMIN 3.8   < > 3.7 3.5 3.6   < > = values in this interval not displayed.   Recent Labs    05/21/19 0000 10/28/19 0000 01/29/20 0000  WBC 9.1 8.1 11.9  NEUTROABS 5,651  --  8,342  HGB 12.0 11.4* 12.1  HCT 36 34* 36  PLT 183 186 144*   Lab Results  Component Value Date   TSH 0.76 05/21/2019   No results found for: HGBA1C No results found for: CHOL, HDL, LDLCALC, LDLDIRECT, TRIG, CHOLHDL  Significant Diagnostic Results in last 30 days:  No results found.  Assessment/Plan: Frequent falls reported fall  02/27/20 when the patient was found sitting upward on room floor, resulted right side scalp laceration, closure with steri strips intact. No decreased ROM or deformity of limbs or pain with walking.  Lack of safety awareness, increased frailty are contributory, close supervision/assistance for safety  Scalp laceration, initial encounter Right parietal scalp laceration is approximated with steri strips, no active bleeding or s/s of infection, will heal.   Gait abnormality Wobbly, continue ambulating with walker, close supervision/assistance needed.   Depression, recurrent (HCC) Stable, continue Mirtazapine, Lorazepam.   Tremor, essential Persisted, still able to feed self, continue Propranolol.   Slow transit constipation Stable, continue Senokot S  Mixed Alzheimer's and vascular dementia (HCC) Advanced, continue supportive measures in SNF [facility], close supervision/assistance for safety since the patient is impulsive/no safety awareness.   Chronic a-fib (HCC) Heart rate is in control.   Lower back pain Pain is controlled, continue Tylenol.     Family/ staff Communication: plan of care reviewed with the patient and charge nurse.   Labs/tests ordered: none  Time spend 25 minutes.

## 2020-03-01 NOTE — Assessment & Plan Note (Signed)
Right parietal scalp laceration is approximated with steri strips, no active bleeding or s/s of infection, will heal.

## 2020-03-02 ENCOUNTER — Encounter: Payer: Self-pay | Admitting: Nurse Practitioner

## 2020-03-04 DIAGNOSIS — N183 Chronic kidney disease, stage 3 unspecified: Secondary | ICD-10-CM | POA: Diagnosis not present

## 2020-03-04 DIAGNOSIS — I130 Hypertensive heart and chronic kidney disease with heart failure and stage 1 through stage 4 chronic kidney disease, or unspecified chronic kidney disease: Secondary | ICD-10-CM | POA: Diagnosis not present

## 2020-03-04 DIAGNOSIS — F0280 Dementia in other diseases classified elsewhere without behavioral disturbance: Secondary | ICD-10-CM | POA: Diagnosis not present

## 2020-03-04 DIAGNOSIS — G309 Alzheimer's disease, unspecified: Secondary | ICD-10-CM | POA: Diagnosis not present

## 2020-03-04 DIAGNOSIS — R296 Repeated falls: Secondary | ICD-10-CM | POA: Diagnosis not present

## 2020-03-04 DIAGNOSIS — I509 Heart failure, unspecified: Secondary | ICD-10-CM | POA: Diagnosis not present

## 2020-03-05 DIAGNOSIS — G309 Alzheimer's disease, unspecified: Secondary | ICD-10-CM | POA: Diagnosis not present

## 2020-03-05 DIAGNOSIS — F0280 Dementia in other diseases classified elsewhere without behavioral disturbance: Secondary | ICD-10-CM | POA: Diagnosis not present

## 2020-03-05 DIAGNOSIS — R296 Repeated falls: Secondary | ICD-10-CM | POA: Diagnosis not present

## 2020-03-05 DIAGNOSIS — N183 Chronic kidney disease, stage 3 unspecified: Secondary | ICD-10-CM | POA: Diagnosis not present

## 2020-03-05 DIAGNOSIS — I509 Heart failure, unspecified: Secondary | ICD-10-CM | POA: Diagnosis not present

## 2020-03-05 DIAGNOSIS — I130 Hypertensive heart and chronic kidney disease with heart failure and stage 1 through stage 4 chronic kidney disease, or unspecified chronic kidney disease: Secondary | ICD-10-CM | POA: Diagnosis not present

## 2020-03-07 DIAGNOSIS — I4891 Unspecified atrial fibrillation: Secondary | ICD-10-CM | POA: Diagnosis not present

## 2020-03-07 DIAGNOSIS — I509 Heart failure, unspecified: Secondary | ICD-10-CM | POA: Diagnosis not present

## 2020-03-07 DIAGNOSIS — Z947 Corneal transplant status: Secondary | ICD-10-CM | POA: Diagnosis not present

## 2020-03-07 DIAGNOSIS — F0280 Dementia in other diseases classified elsewhere without behavioral disturbance: Secondary | ICD-10-CM | POA: Diagnosis not present

## 2020-03-07 DIAGNOSIS — E46 Unspecified protein-calorie malnutrition: Secondary | ICD-10-CM | POA: Diagnosis not present

## 2020-03-07 DIAGNOSIS — S0181XD Laceration without foreign body of other part of head, subsequent encounter: Secondary | ICD-10-CM | POA: Diagnosis not present

## 2020-03-07 DIAGNOSIS — F418 Other specified anxiety disorders: Secondary | ICD-10-CM | POA: Diagnosis not present

## 2020-03-07 DIAGNOSIS — R296 Repeated falls: Secondary | ICD-10-CM | POA: Diagnosis not present

## 2020-03-07 DIAGNOSIS — G309 Alzheimer's disease, unspecified: Secondary | ICD-10-CM | POA: Diagnosis not present

## 2020-03-07 DIAGNOSIS — R159 Full incontinence of feces: Secondary | ICD-10-CM | POA: Diagnosis not present

## 2020-03-07 DIAGNOSIS — Z741 Need for assistance with personal care: Secondary | ICD-10-CM | POA: Diagnosis not present

## 2020-03-07 DIAGNOSIS — S22080D Wedge compression fracture of T11-T12 vertebra, subsequent encounter for fracture with routine healing: Secondary | ICD-10-CM | POA: Diagnosis not present

## 2020-03-07 DIAGNOSIS — Z681 Body mass index (BMI) 19 or less, adult: Secondary | ICD-10-CM | POA: Diagnosis not present

## 2020-03-07 DIAGNOSIS — Z87440 Personal history of urinary (tract) infections: Secondary | ICD-10-CM | POA: Diagnosis not present

## 2020-03-07 DIAGNOSIS — D631 Anemia in chronic kidney disease: Secondary | ICD-10-CM | POA: Diagnosis not present

## 2020-03-07 DIAGNOSIS — I130 Hypertensive heart and chronic kidney disease with heart failure and stage 1 through stage 4 chronic kidney disease, or unspecified chronic kidney disease: Secondary | ICD-10-CM | POA: Diagnosis not present

## 2020-03-07 DIAGNOSIS — H01003 Unspecified blepharitis right eye, unspecified eyelid: Secondary | ICD-10-CM | POA: Diagnosis not present

## 2020-03-07 DIAGNOSIS — I7300 Raynaud's syndrome without gangrene: Secondary | ICD-10-CM | POA: Diagnosis not present

## 2020-03-07 DIAGNOSIS — G250 Essential tremor: Secondary | ICD-10-CM | POA: Diagnosis not present

## 2020-03-07 DIAGNOSIS — N183 Chronic kidney disease, stage 3 unspecified: Secondary | ICD-10-CM | POA: Diagnosis not present

## 2020-03-07 DIAGNOSIS — R32 Unspecified urinary incontinence: Secondary | ICD-10-CM | POA: Diagnosis not present

## 2020-03-10 DIAGNOSIS — I509 Heart failure, unspecified: Secondary | ICD-10-CM | POA: Diagnosis not present

## 2020-03-10 DIAGNOSIS — R296 Repeated falls: Secondary | ICD-10-CM | POA: Diagnosis not present

## 2020-03-10 DIAGNOSIS — N183 Chronic kidney disease, stage 3 unspecified: Secondary | ICD-10-CM | POA: Diagnosis not present

## 2020-03-10 DIAGNOSIS — G309 Alzheimer's disease, unspecified: Secondary | ICD-10-CM | POA: Diagnosis not present

## 2020-03-10 DIAGNOSIS — I130 Hypertensive heart and chronic kidney disease with heart failure and stage 1 through stage 4 chronic kidney disease, or unspecified chronic kidney disease: Secondary | ICD-10-CM | POA: Diagnosis not present

## 2020-03-10 DIAGNOSIS — F0280 Dementia in other diseases classified elsewhere without behavioral disturbance: Secondary | ICD-10-CM | POA: Diagnosis not present

## 2020-03-11 DIAGNOSIS — N183 Chronic kidney disease, stage 3 unspecified: Secondary | ICD-10-CM | POA: Diagnosis not present

## 2020-03-11 DIAGNOSIS — G309 Alzheimer's disease, unspecified: Secondary | ICD-10-CM | POA: Diagnosis not present

## 2020-03-11 DIAGNOSIS — I509 Heart failure, unspecified: Secondary | ICD-10-CM | POA: Diagnosis not present

## 2020-03-11 DIAGNOSIS — F0280 Dementia in other diseases classified elsewhere without behavioral disturbance: Secondary | ICD-10-CM | POA: Diagnosis not present

## 2020-03-11 DIAGNOSIS — R296 Repeated falls: Secondary | ICD-10-CM | POA: Diagnosis not present

## 2020-03-11 DIAGNOSIS — I130 Hypertensive heart and chronic kidney disease with heart failure and stage 1 through stage 4 chronic kidney disease, or unspecified chronic kidney disease: Secondary | ICD-10-CM | POA: Diagnosis not present

## 2020-03-14 DIAGNOSIS — I130 Hypertensive heart and chronic kidney disease with heart failure and stage 1 through stage 4 chronic kidney disease, or unspecified chronic kidney disease: Secondary | ICD-10-CM | POA: Diagnosis not present

## 2020-03-14 DIAGNOSIS — R296 Repeated falls: Secondary | ICD-10-CM | POA: Diagnosis not present

## 2020-03-14 DIAGNOSIS — I509 Heart failure, unspecified: Secondary | ICD-10-CM | POA: Diagnosis not present

## 2020-03-14 DIAGNOSIS — N183 Chronic kidney disease, stage 3 unspecified: Secondary | ICD-10-CM | POA: Diagnosis not present

## 2020-03-14 DIAGNOSIS — G309 Alzheimer's disease, unspecified: Secondary | ICD-10-CM | POA: Diagnosis not present

## 2020-03-14 DIAGNOSIS — F0280 Dementia in other diseases classified elsewhere without behavioral disturbance: Secondary | ICD-10-CM | POA: Diagnosis not present

## 2020-03-18 DIAGNOSIS — N183 Chronic kidney disease, stage 3 unspecified: Secondary | ICD-10-CM | POA: Diagnosis not present

## 2020-03-18 DIAGNOSIS — G309 Alzheimer's disease, unspecified: Secondary | ICD-10-CM | POA: Diagnosis not present

## 2020-03-18 DIAGNOSIS — R296 Repeated falls: Secondary | ICD-10-CM | POA: Diagnosis not present

## 2020-03-18 DIAGNOSIS — I509 Heart failure, unspecified: Secondary | ICD-10-CM | POA: Diagnosis not present

## 2020-03-18 DIAGNOSIS — I130 Hypertensive heart and chronic kidney disease with heart failure and stage 1 through stage 4 chronic kidney disease, or unspecified chronic kidney disease: Secondary | ICD-10-CM | POA: Diagnosis not present

## 2020-03-18 DIAGNOSIS — F0280 Dementia in other diseases classified elsewhere without behavioral disturbance: Secondary | ICD-10-CM | POA: Diagnosis not present

## 2020-03-21 DIAGNOSIS — R296 Repeated falls: Secondary | ICD-10-CM | POA: Diagnosis not present

## 2020-03-21 DIAGNOSIS — I509 Heart failure, unspecified: Secondary | ICD-10-CM | POA: Diagnosis not present

## 2020-03-21 DIAGNOSIS — N183 Chronic kidney disease, stage 3 unspecified: Secondary | ICD-10-CM | POA: Diagnosis not present

## 2020-03-21 DIAGNOSIS — G309 Alzheimer's disease, unspecified: Secondary | ICD-10-CM | POA: Diagnosis not present

## 2020-03-21 DIAGNOSIS — I130 Hypertensive heart and chronic kidney disease with heart failure and stage 1 through stage 4 chronic kidney disease, or unspecified chronic kidney disease: Secondary | ICD-10-CM | POA: Diagnosis not present

## 2020-03-21 DIAGNOSIS — F0280 Dementia in other diseases classified elsewhere without behavioral disturbance: Secondary | ICD-10-CM | POA: Diagnosis not present

## 2020-03-24 DIAGNOSIS — G309 Alzheimer's disease, unspecified: Secondary | ICD-10-CM | POA: Diagnosis not present

## 2020-03-24 DIAGNOSIS — N183 Chronic kidney disease, stage 3 unspecified: Secondary | ICD-10-CM | POA: Diagnosis not present

## 2020-03-24 DIAGNOSIS — I130 Hypertensive heart and chronic kidney disease with heart failure and stage 1 through stage 4 chronic kidney disease, or unspecified chronic kidney disease: Secondary | ICD-10-CM | POA: Diagnosis not present

## 2020-03-24 DIAGNOSIS — F0280 Dementia in other diseases classified elsewhere without behavioral disturbance: Secondary | ICD-10-CM | POA: Diagnosis not present

## 2020-03-24 DIAGNOSIS — R296 Repeated falls: Secondary | ICD-10-CM | POA: Diagnosis not present

## 2020-03-24 DIAGNOSIS — I509 Heart failure, unspecified: Secondary | ICD-10-CM | POA: Diagnosis not present

## 2020-03-25 DIAGNOSIS — F0280 Dementia in other diseases classified elsewhere without behavioral disturbance: Secondary | ICD-10-CM | POA: Diagnosis not present

## 2020-03-25 DIAGNOSIS — N183 Chronic kidney disease, stage 3 unspecified: Secondary | ICD-10-CM | POA: Diagnosis not present

## 2020-03-25 DIAGNOSIS — I130 Hypertensive heart and chronic kidney disease with heart failure and stage 1 through stage 4 chronic kidney disease, or unspecified chronic kidney disease: Secondary | ICD-10-CM | POA: Diagnosis not present

## 2020-03-25 DIAGNOSIS — I509 Heart failure, unspecified: Secondary | ICD-10-CM | POA: Diagnosis not present

## 2020-03-25 DIAGNOSIS — G309 Alzheimer's disease, unspecified: Secondary | ICD-10-CM | POA: Diagnosis not present

## 2020-03-25 DIAGNOSIS — R296 Repeated falls: Secondary | ICD-10-CM | POA: Diagnosis not present

## 2020-03-26 DIAGNOSIS — R296 Repeated falls: Secondary | ICD-10-CM | POA: Diagnosis not present

## 2020-03-26 DIAGNOSIS — I130 Hypertensive heart and chronic kidney disease with heart failure and stage 1 through stage 4 chronic kidney disease, or unspecified chronic kidney disease: Secondary | ICD-10-CM | POA: Diagnosis not present

## 2020-03-26 DIAGNOSIS — G309 Alzheimer's disease, unspecified: Secondary | ICD-10-CM | POA: Diagnosis not present

## 2020-03-26 DIAGNOSIS — N183 Chronic kidney disease, stage 3 unspecified: Secondary | ICD-10-CM | POA: Diagnosis not present

## 2020-03-26 DIAGNOSIS — F0280 Dementia in other diseases classified elsewhere without behavioral disturbance: Secondary | ICD-10-CM | POA: Diagnosis not present

## 2020-03-26 DIAGNOSIS — I509 Heart failure, unspecified: Secondary | ICD-10-CM | POA: Diagnosis not present

## 2020-03-28 DIAGNOSIS — I130 Hypertensive heart and chronic kidney disease with heart failure and stage 1 through stage 4 chronic kidney disease, or unspecified chronic kidney disease: Secondary | ICD-10-CM | POA: Diagnosis not present

## 2020-03-28 DIAGNOSIS — G309 Alzheimer's disease, unspecified: Secondary | ICD-10-CM | POA: Diagnosis not present

## 2020-03-28 DIAGNOSIS — I509 Heart failure, unspecified: Secondary | ICD-10-CM | POA: Diagnosis not present

## 2020-03-28 DIAGNOSIS — R296 Repeated falls: Secondary | ICD-10-CM | POA: Diagnosis not present

## 2020-03-28 DIAGNOSIS — N183 Chronic kidney disease, stage 3 unspecified: Secondary | ICD-10-CM | POA: Diagnosis not present

## 2020-03-28 DIAGNOSIS — F0280 Dementia in other diseases classified elsewhere without behavioral disturbance: Secondary | ICD-10-CM | POA: Diagnosis not present

## 2020-03-29 ENCOUNTER — Encounter: Payer: Self-pay | Admitting: Internal Medicine

## 2020-03-29 ENCOUNTER — Non-Acute Institutional Stay (SKILLED_NURSING_FACILITY): Payer: Medicare Other | Admitting: Internal Medicine

## 2020-03-29 DIAGNOSIS — G309 Alzheimer's disease, unspecified: Secondary | ICD-10-CM

## 2020-03-29 DIAGNOSIS — R296 Repeated falls: Secondary | ICD-10-CM

## 2020-03-29 DIAGNOSIS — I482 Chronic atrial fibrillation, unspecified: Secondary | ICD-10-CM | POA: Diagnosis not present

## 2020-03-29 DIAGNOSIS — F0150 Vascular dementia without behavioral disturbance: Secondary | ICD-10-CM | POA: Diagnosis not present

## 2020-03-29 DIAGNOSIS — G250 Essential tremor: Secondary | ICD-10-CM | POA: Diagnosis not present

## 2020-03-29 DIAGNOSIS — R269 Unspecified abnormalities of gait and mobility: Secondary | ICD-10-CM

## 2020-03-29 DIAGNOSIS — F0280 Dementia in other diseases classified elsewhere without behavioral disturbance: Secondary | ICD-10-CM

## 2020-03-29 NOTE — Progress Notes (Signed)
Location:   [facility] Room Number: 31 Place of Service:  SNF [phone]) Provider:  Einar Crow, MD  Mahlon Gammon, MD  Patient Care Team: Mahlon Gammon, MD as PCP - General (Internal Medicine) Nahser, Deloris Ping, MD as PCP - Cardiology (Cardiology) Mast, Man X, NP as Nurse Practitioner (Internal Medicine)  Extended Emergency Contact Information Primary Emergency Contact: Morgan,Tom Address: [address]          [place], [place] [address] [place] of [place] Home Phone: [phone] Relation: Friend Secondary Emergency Contact: Fletcher,Christa  [place] Home Phone: [phone] Relation: Niece  Code Status:  DNR Goals of care: Advanced Directive information Advanced Directives 03/29/2020  Does Patient Have a Medical Advance Directive? Yes  Type of Advance Directive Healthcare Power of Attorney  Does patient want to make changes to medical advance directive? No - Patient declined  Copy of Healthcare Power of Attorney in Chart? Yes - validated most recent copy scanned in chart (See row information)  Would patient like information on creating a medical advance directive? -  Pre-existing out of facility DNR order (yellow form or pink MOST form) -     Chief Complaint  Patient presents with  . Medical Management of Chronic Issues    Routine follow up visit  . Best Practice Recommendations    Flu vaccine    HPI:  Pt is a 86 y.o. female seen today for medical management of chronic diseases.    Patient has h/o Atrial Fibrillation,LE edema, Hypertension, Dysphagia,Essential Tremor, Anemia, Dementia,and depression, FallsWeight loss Patient is a long-term resident  Has history of frequent falls History of end-stage dementia is enrolled in hospice Continues to have Falls Weight is stable Off Remeron now Unable to give any history Walks with walker and continues to be Unstable Gait  Past Medical History:  Diagnosis Date    . Anticoagulant long-term use   . Atrial fibrillation (HCC)   . CHF (congestive heart failure) (HCC)   . Chronic anticoagulation   . Hypertension   . Raynaud phenomenon   . Tremor, essential    Past Surgical History:  Procedure Laterality Date  . CHOLECYSTECTOMY    . TONSILLECTOMY      Allergies  Allergen Reactions  . Fosamax [Alendronate]   . Other Nausea And Vomiting    Green pepper    Allergies as of 03/29/2020      Reactions   Fosamax [alendronate]    Other Nausea And Vomiting   Green pepper      Medication List       Accurate as of March 29, 2020 11:54 AM. If you have any questions, ask your nurse or doctor.        acetaminophen 500 MG tablet Commonly known as: TYLENOL Take 500 mg by mouth every 8 (eight) hours as needed for mild pain or moderate pain.   acetaminophen 500 MG tablet Commonly known as: TYLENOL Take 1,000 mg by mouth 2 (two) times daily. Not to exceed 3,00 mg daily.   Caltrate 600+D Plus Minerals 600-800 MG-UNIT Chew Chew 1 tablet by mouth 2 (two) times a day.   NON FORMULARY Regular, Nectar Thick, Mech Soft Special Instructions: Regular diet with Nectar thick liquids by straw. 2 handled cup and divided plate with built-up utensils for meals.   propranolol ER 60 MG 24 hr capsule Commonly known as: INDERAL LA Take 60 mg by mouth every other day.   sennosides-docusate sodium 8.6-50 MG tablet  Commonly known as: SENOKOT-S Take 2 tablets by mouth at bedtime.       Review of Systems  Unable to perform ROS: Dementia    Immunization History  Administered Date(s) Administered  . Influenza Whole 05/09/2018  . Influenza, High Dose Seasonal PF 05/09/2019  . Influenza-Unspecified 05/28/2017  . Moderna SARS-COVID-2 Vaccination 08/09/2019, 09/06/2019  . Pneumococcal Conjugate-13 07/13/2017  . Pneumococcal Polysaccharide-23 04/07/2002  . Tdap 07/13/2017, 10/08/2017   Pertinent  Health Maintenance Due  Topic Date Due  . INFLUENZA  VACCINE  03/07/2020  . DEXA SCAN  Completed  . PNA vac Low Risk Adult  Completed   Fall Risk  03/10/2019 10/05/2017  Falls in the past year? (No Data) No  Comment Emmi Telephone Survey: data to providers prior to load -  Number falls in past yr: (No Data) -  Comment Emmi Telephone Survey Actual Response =  -   Functional Status Survey:    Vitals:   03/29/20 1150  BP: 110/60  Pulse: 74  Resp: 20  Temp: 97.6 F (36.4 C)  SpO2: 96%  Weight: 96 lb 9.6 oz (43.8 kg)  Height: 5\' 4"  (1.626 m)   Body mass index is 16.58 kg/m. Physical Exam Vitals reviewed.  Constitutional:      Comments: Very Frail  HENT:     Head: Normocephalic.     Nose: Nose normal.     Mouth/Throat:     Mouth: Mucous membranes are moist.     Pharynx: Oropharynx is clear.  Eyes:     Pupils: Pupils are equal, round, and reactive to light.  Cardiovascular:     Rate and Rhythm: Normal rate and regular rhythm.     Pulses: Normal pulses.  Pulmonary:     Effort: Pulmonary effort is normal.     Breath sounds: Normal breath sounds.  Abdominal:     General: Abdomen is flat. Bowel sounds are normal.     Palpations: Abdomen is soft.  Musculoskeletal:        General: No swelling.     Cervical back: Neck supple.  Skin:    General: Skin is warm.  Neurological:     General: No focal deficit present.     Mental Status: She is alert.  Psychiatric:        Mood and Affect: Mood normal.     Labs reviewed: Recent Labs    05/21/19 0000 10/28/19 0000 01/29/20 0000  NA 144 144 145  K 3.7 4.9 4.3  CL 103 105 103  CO2 33 33* 34*  BUN 26* 29* 41*  CREATININE 1.1 1.1 1.2*  CALCIUM 9.9 9.9 9.7   Recent Labs    05/13/19 0000 05/13/19 0000 05/21/19 0000 10/28/19 0000 01/29/20 0000  AST 14   < > 13 15 12*  ALT 6*   < > 5* 6* 7  ALKPHOS 43   < > 41 43 44  PROT 6.3*  --  6.1*  --   --   ALBUMIN 3.8   < > 3.7 3.5 3.6   < > = values in this interval not displayed.   Recent Labs    05/21/19 0000  10/28/19 0000 01/29/20 0000  WBC 9.1 8.1 11.9  NEUTROABS 5,651  --  8,342  HGB 12.0 11.4* 12.1  HCT 36 34* 36  PLT 183 186 144*   Lab Results  Component Value Date   TSH 0.76 05/21/2019   No results found for: HGBA1C No results found for: CHOL, HDL, LDLCALC,  LDLDIRECT, TRIG, CHOLHDL  Significant Diagnostic Results in last 30 days:  No results found.  Assessment/Plan  Frequent falls Continues to be Challenge Supportive Care  Tremor, essential On Propanol Chronic a-fib (HCC) Not on Any Anticoagulation Mixed Alzheimer's and vascular dementia (HCC) Supportive care Enrolled in Hospice   Family/ staff Communication:   Labs/tests ordered:

## 2020-03-30 DIAGNOSIS — G309 Alzheimer's disease, unspecified: Secondary | ICD-10-CM | POA: Diagnosis not present

## 2020-03-30 DIAGNOSIS — N183 Chronic kidney disease, stage 3 unspecified: Secondary | ICD-10-CM | POA: Diagnosis not present

## 2020-03-30 DIAGNOSIS — I509 Heart failure, unspecified: Secondary | ICD-10-CM | POA: Diagnosis not present

## 2020-03-30 DIAGNOSIS — I130 Hypertensive heart and chronic kidney disease with heart failure and stage 1 through stage 4 chronic kidney disease, or unspecified chronic kidney disease: Secondary | ICD-10-CM | POA: Diagnosis not present

## 2020-03-30 DIAGNOSIS — F0280 Dementia in other diseases classified elsewhere without behavioral disturbance: Secondary | ICD-10-CM | POA: Diagnosis not present

## 2020-03-30 DIAGNOSIS — R296 Repeated falls: Secondary | ICD-10-CM | POA: Diagnosis not present

## 2020-04-02 DIAGNOSIS — I509 Heart failure, unspecified: Secondary | ICD-10-CM | POA: Diagnosis not present

## 2020-04-02 DIAGNOSIS — N183 Chronic kidney disease, stage 3 unspecified: Secondary | ICD-10-CM | POA: Diagnosis not present

## 2020-04-02 DIAGNOSIS — R296 Repeated falls: Secondary | ICD-10-CM | POA: Diagnosis not present

## 2020-04-02 DIAGNOSIS — G309 Alzheimer's disease, unspecified: Secondary | ICD-10-CM | POA: Diagnosis not present

## 2020-04-02 DIAGNOSIS — F0280 Dementia in other diseases classified elsewhere without behavioral disturbance: Secondary | ICD-10-CM | POA: Diagnosis not present

## 2020-04-02 DIAGNOSIS — I130 Hypertensive heart and chronic kidney disease with heart failure and stage 1 through stage 4 chronic kidney disease, or unspecified chronic kidney disease: Secondary | ICD-10-CM | POA: Diagnosis not present

## 2020-04-04 DIAGNOSIS — F0280 Dementia in other diseases classified elsewhere without behavioral disturbance: Secondary | ICD-10-CM | POA: Diagnosis not present

## 2020-04-04 DIAGNOSIS — R296 Repeated falls: Secondary | ICD-10-CM | POA: Diagnosis not present

## 2020-04-04 DIAGNOSIS — I130 Hypertensive heart and chronic kidney disease with heart failure and stage 1 through stage 4 chronic kidney disease, or unspecified chronic kidney disease: Secondary | ICD-10-CM | POA: Diagnosis not present

## 2020-04-04 DIAGNOSIS — G309 Alzheimer's disease, unspecified: Secondary | ICD-10-CM | POA: Diagnosis not present

## 2020-04-04 DIAGNOSIS — I509 Heart failure, unspecified: Secondary | ICD-10-CM | POA: Diagnosis not present

## 2020-04-04 DIAGNOSIS — N183 Chronic kidney disease, stage 3 unspecified: Secondary | ICD-10-CM | POA: Diagnosis not present

## 2020-04-07 DIAGNOSIS — Z87440 Personal history of urinary (tract) infections: Secondary | ICD-10-CM | POA: Diagnosis not present

## 2020-04-07 DIAGNOSIS — E46 Unspecified protein-calorie malnutrition: Secondary | ICD-10-CM | POA: Diagnosis not present

## 2020-04-07 DIAGNOSIS — H01003 Unspecified blepharitis right eye, unspecified eyelid: Secondary | ICD-10-CM | POA: Diagnosis not present

## 2020-04-07 DIAGNOSIS — S22080D Wedge compression fracture of T11-T12 vertebra, subsequent encounter for fracture with routine healing: Secondary | ICD-10-CM | POA: Diagnosis not present

## 2020-04-07 DIAGNOSIS — Z947 Corneal transplant status: Secondary | ICD-10-CM | POA: Diagnosis not present

## 2020-04-07 DIAGNOSIS — I7300 Raynaud's syndrome without gangrene: Secondary | ICD-10-CM | POA: Diagnosis not present

## 2020-04-07 DIAGNOSIS — G309 Alzheimer's disease, unspecified: Secondary | ICD-10-CM | POA: Diagnosis not present

## 2020-04-07 DIAGNOSIS — R296 Repeated falls: Secondary | ICD-10-CM | POA: Diagnosis not present

## 2020-04-07 DIAGNOSIS — Z681 Body mass index (BMI) 19 or less, adult: Secondary | ICD-10-CM | POA: Diagnosis not present

## 2020-04-07 DIAGNOSIS — F418 Other specified anxiety disorders: Secondary | ICD-10-CM | POA: Diagnosis not present

## 2020-04-07 DIAGNOSIS — R159 Full incontinence of feces: Secondary | ICD-10-CM | POA: Diagnosis not present

## 2020-04-07 DIAGNOSIS — I130 Hypertensive heart and chronic kidney disease with heart failure and stage 1 through stage 4 chronic kidney disease, or unspecified chronic kidney disease: Secondary | ICD-10-CM | POA: Diagnosis not present

## 2020-04-07 DIAGNOSIS — N183 Chronic kidney disease, stage 3 unspecified: Secondary | ICD-10-CM | POA: Diagnosis not present

## 2020-04-07 DIAGNOSIS — R32 Unspecified urinary incontinence: Secondary | ICD-10-CM | POA: Diagnosis not present

## 2020-04-07 DIAGNOSIS — G250 Essential tremor: Secondary | ICD-10-CM | POA: Diagnosis not present

## 2020-04-07 DIAGNOSIS — F0280 Dementia in other diseases classified elsewhere without behavioral disturbance: Secondary | ICD-10-CM | POA: Diagnosis not present

## 2020-04-07 DIAGNOSIS — S0181XD Laceration without foreign body of other part of head, subsequent encounter: Secondary | ICD-10-CM | POA: Diagnosis not present

## 2020-04-07 DIAGNOSIS — I509 Heart failure, unspecified: Secondary | ICD-10-CM | POA: Diagnosis not present

## 2020-04-07 DIAGNOSIS — D631 Anemia in chronic kidney disease: Secondary | ICD-10-CM | POA: Diagnosis not present

## 2020-04-07 DIAGNOSIS — I4891 Unspecified atrial fibrillation: Secondary | ICD-10-CM | POA: Diagnosis not present

## 2020-04-07 DIAGNOSIS — Z741 Need for assistance with personal care: Secondary | ICD-10-CM | POA: Diagnosis not present

## 2020-04-08 DIAGNOSIS — I509 Heart failure, unspecified: Secondary | ICD-10-CM | POA: Diagnosis not present

## 2020-04-08 DIAGNOSIS — F0280 Dementia in other diseases classified elsewhere without behavioral disturbance: Secondary | ICD-10-CM | POA: Diagnosis not present

## 2020-04-08 DIAGNOSIS — R296 Repeated falls: Secondary | ICD-10-CM | POA: Diagnosis not present

## 2020-04-08 DIAGNOSIS — G309 Alzheimer's disease, unspecified: Secondary | ICD-10-CM | POA: Diagnosis not present

## 2020-04-08 DIAGNOSIS — N183 Chronic kidney disease, stage 3 unspecified: Secondary | ICD-10-CM | POA: Diagnosis not present

## 2020-04-08 DIAGNOSIS — I130 Hypertensive heart and chronic kidney disease with heart failure and stage 1 through stage 4 chronic kidney disease, or unspecified chronic kidney disease: Secondary | ICD-10-CM | POA: Diagnosis not present

## 2020-04-09 ENCOUNTER — Other Ambulatory Visit: Payer: Self-pay

## 2020-04-09 MED ORDER — LORAZEPAM 0.5 MG PO TABS
0.5000 mg | ORAL_TABLET | Freq: Two times a day (BID) | ORAL | 0 refills | Status: DC
Start: 2020-04-09 — End: 2020-06-01

## 2020-04-09 NOTE — Telephone Encounter (Signed)
HOSPICE PATIENT  Incoming illegible fax was received from [facility].  I had to get another medical assistant Nettie Elm) who had access to [facility] medical records system to clarify patients name and date of birth for the copy received was illegible. We were able to read the room number 31.   Request is for Ativan 0.5 mg by mouth twice daily (hold for sedation)

## 2020-04-11 DIAGNOSIS — F0280 Dementia in other diseases classified elsewhere without behavioral disturbance: Secondary | ICD-10-CM | POA: Diagnosis not present

## 2020-04-11 DIAGNOSIS — I130 Hypertensive heart and chronic kidney disease with heart failure and stage 1 through stage 4 chronic kidney disease, or unspecified chronic kidney disease: Secondary | ICD-10-CM | POA: Diagnosis not present

## 2020-04-11 DIAGNOSIS — G309 Alzheimer's disease, unspecified: Secondary | ICD-10-CM | POA: Diagnosis not present

## 2020-04-11 DIAGNOSIS — I509 Heart failure, unspecified: Secondary | ICD-10-CM | POA: Diagnosis not present

## 2020-04-11 DIAGNOSIS — R296 Repeated falls: Secondary | ICD-10-CM | POA: Diagnosis not present

## 2020-04-11 DIAGNOSIS — N183 Chronic kidney disease, stage 3 unspecified: Secondary | ICD-10-CM | POA: Diagnosis not present

## 2020-04-15 DIAGNOSIS — F0280 Dementia in other diseases classified elsewhere without behavioral disturbance: Secondary | ICD-10-CM | POA: Diagnosis not present

## 2020-04-15 DIAGNOSIS — R296 Repeated falls: Secondary | ICD-10-CM | POA: Diagnosis not present

## 2020-04-15 DIAGNOSIS — G309 Alzheimer's disease, unspecified: Secondary | ICD-10-CM | POA: Diagnosis not present

## 2020-04-15 DIAGNOSIS — N183 Chronic kidney disease, stage 3 unspecified: Secondary | ICD-10-CM | POA: Diagnosis not present

## 2020-04-15 DIAGNOSIS — I130 Hypertensive heart and chronic kidney disease with heart failure and stage 1 through stage 4 chronic kidney disease, or unspecified chronic kidney disease: Secondary | ICD-10-CM | POA: Diagnosis not present

## 2020-04-15 DIAGNOSIS — I509 Heart failure, unspecified: Secondary | ICD-10-CM | POA: Diagnosis not present

## 2020-04-18 DIAGNOSIS — R296 Repeated falls: Secondary | ICD-10-CM | POA: Diagnosis not present

## 2020-04-18 DIAGNOSIS — N183 Chronic kidney disease, stage 3 unspecified: Secondary | ICD-10-CM | POA: Diagnosis not present

## 2020-04-18 DIAGNOSIS — I509 Heart failure, unspecified: Secondary | ICD-10-CM | POA: Diagnosis not present

## 2020-04-18 DIAGNOSIS — G309 Alzheimer's disease, unspecified: Secondary | ICD-10-CM | POA: Diagnosis not present

## 2020-04-18 DIAGNOSIS — I130 Hypertensive heart and chronic kidney disease with heart failure and stage 1 through stage 4 chronic kidney disease, or unspecified chronic kidney disease: Secondary | ICD-10-CM | POA: Diagnosis not present

## 2020-04-18 DIAGNOSIS — F0280 Dementia in other diseases classified elsewhere without behavioral disturbance: Secondary | ICD-10-CM | POA: Diagnosis not present

## 2020-04-22 DIAGNOSIS — I509 Heart failure, unspecified: Secondary | ICD-10-CM | POA: Diagnosis not present

## 2020-04-22 DIAGNOSIS — I130 Hypertensive heart and chronic kidney disease with heart failure and stage 1 through stage 4 chronic kidney disease, or unspecified chronic kidney disease: Secondary | ICD-10-CM | POA: Diagnosis not present

## 2020-04-22 DIAGNOSIS — F0280 Dementia in other diseases classified elsewhere without behavioral disturbance: Secondary | ICD-10-CM | POA: Diagnosis not present

## 2020-04-22 DIAGNOSIS — R296 Repeated falls: Secondary | ICD-10-CM | POA: Diagnosis not present

## 2020-04-22 DIAGNOSIS — G309 Alzheimer's disease, unspecified: Secondary | ICD-10-CM | POA: Diagnosis not present

## 2020-04-22 DIAGNOSIS — N183 Chronic kidney disease, stage 3 unspecified: Secondary | ICD-10-CM | POA: Diagnosis not present

## 2020-04-23 DIAGNOSIS — N183 Chronic kidney disease, stage 3 unspecified: Secondary | ICD-10-CM | POA: Diagnosis not present

## 2020-04-23 DIAGNOSIS — F0280 Dementia in other diseases classified elsewhere without behavioral disturbance: Secondary | ICD-10-CM | POA: Diagnosis not present

## 2020-04-23 DIAGNOSIS — G309 Alzheimer's disease, unspecified: Secondary | ICD-10-CM | POA: Diagnosis not present

## 2020-04-23 DIAGNOSIS — I509 Heart failure, unspecified: Secondary | ICD-10-CM | POA: Diagnosis not present

## 2020-04-23 DIAGNOSIS — R296 Repeated falls: Secondary | ICD-10-CM | POA: Diagnosis not present

## 2020-04-23 DIAGNOSIS — I130 Hypertensive heart and chronic kidney disease with heart failure and stage 1 through stage 4 chronic kidney disease, or unspecified chronic kidney disease: Secondary | ICD-10-CM | POA: Diagnosis not present

## 2020-04-25 DIAGNOSIS — G309 Alzheimer's disease, unspecified: Secondary | ICD-10-CM | POA: Diagnosis not present

## 2020-04-25 DIAGNOSIS — I509 Heart failure, unspecified: Secondary | ICD-10-CM | POA: Diagnosis not present

## 2020-04-25 DIAGNOSIS — I130 Hypertensive heart and chronic kidney disease with heart failure and stage 1 through stage 4 chronic kidney disease, or unspecified chronic kidney disease: Secondary | ICD-10-CM | POA: Diagnosis not present

## 2020-04-25 DIAGNOSIS — F0280 Dementia in other diseases classified elsewhere without behavioral disturbance: Secondary | ICD-10-CM | POA: Diagnosis not present

## 2020-04-25 DIAGNOSIS — N183 Chronic kidney disease, stage 3 unspecified: Secondary | ICD-10-CM | POA: Diagnosis not present

## 2020-04-25 DIAGNOSIS — R296 Repeated falls: Secondary | ICD-10-CM | POA: Diagnosis not present

## 2020-04-26 ENCOUNTER — Non-Acute Institutional Stay (SKILLED_NURSING_FACILITY): Payer: Medicare Other | Admitting: Internal Medicine

## 2020-04-26 ENCOUNTER — Encounter: Payer: Self-pay | Admitting: Internal Medicine

## 2020-04-26 DIAGNOSIS — G250 Essential tremor: Secondary | ICD-10-CM

## 2020-04-26 DIAGNOSIS — R296 Repeated falls: Secondary | ICD-10-CM

## 2020-04-26 DIAGNOSIS — I482 Chronic atrial fibrillation, unspecified: Secondary | ICD-10-CM | POA: Diagnosis not present

## 2020-04-26 DIAGNOSIS — H1033 Unspecified acute conjunctivitis, bilateral: Secondary | ICD-10-CM | POA: Diagnosis not present

## 2020-04-26 DIAGNOSIS — F0150 Vascular dementia without behavioral disturbance: Secondary | ICD-10-CM | POA: Diagnosis not present

## 2020-04-26 DIAGNOSIS — I130 Hypertensive heart and chronic kidney disease with heart failure and stage 1 through stage 4 chronic kidney disease, or unspecified chronic kidney disease: Secondary | ICD-10-CM | POA: Diagnosis not present

## 2020-04-26 DIAGNOSIS — F0280 Dementia in other diseases classified elsewhere without behavioral disturbance: Secondary | ICD-10-CM | POA: Diagnosis not present

## 2020-04-26 DIAGNOSIS — G309 Alzheimer's disease, unspecified: Secondary | ICD-10-CM | POA: Diagnosis not present

## 2020-04-26 DIAGNOSIS — N183 Chronic kidney disease, stage 3 unspecified: Secondary | ICD-10-CM | POA: Diagnosis not present

## 2020-04-26 DIAGNOSIS — I509 Heart failure, unspecified: Secondary | ICD-10-CM | POA: Diagnosis not present

## 2020-04-26 NOTE — Progress Notes (Signed)
Location: [facility] Room Number: 31 Place of Service:  SNF [phone])  Provider: Einar Crow MD  Code Status: DNR/Hospice Goals of Care:  Advanced Directives 03/29/2020  Does Patient Have a Medical Advance Directive? Yes  Type of Advance Directive Healthcare Power of Attorney  Does patient want to make changes to medical advance directive? No - Patient declined  Copy of Healthcare Power of Attorney in Chart? Yes - validated most recent copy scanned in chart (See row information)  Would patient like information on creating a medical advance directive? -  Pre-existing out of facility DNR order (yellow form or pink MOST form) -     Chief Complaint  Patient presents with  . Acute Visit    Eye redness    HPI: Patient is a 86 y.o. female seen today for an acute visit for Redness and discharge from her both Eyes  Patient has h/o Atrial Fibrillation,LE edema, Hypertension, Dysphagia,Essential Tremor, Anemia, Dementia,and depression, FallsWeight loss Patient is a long-term resident  Hospice Nurses noticed Discharge and redness in her eyes Patient cannot give any history due to her Dementia Does not look like she is in Pain. No Itching Noticed   Past Medical History:  Diagnosis Date  . Anticoagulant long-term use   . Atrial fibrillation (HCC)   . CHF (congestive heart failure) (HCC)   . Chronic anticoagulation   . Hypertension   . Raynaud phenomenon   . Tremor, essential     Past Surgical History:  Procedure Laterality Date  . CHOLECYSTECTOMY    . TONSILLECTOMY      Allergies  Allergen Reactions  . Fosamax [Alendronate]   . Other Nausea And Vomiting    Green pepper    Outpatient Encounter Medications as of 04/26/2020  Medication Sig  . acetaminophen (TYLENOL) 500 MG tablet Take 500 mg by mouth every 8 (eight) hours as needed for mild pain or moderate pain.   Marland Kitchen acetaminophen (TYLENOL) 500 MG tablet Take 1,000 mg by mouth 2 (two) times  daily. Not to exceed 3,00 mg daily.  . Calcium Carbonate-Vit D-Min (CALTRATE 600+D PLUS MINERALS) 600-800 MG-UNIT CHEW Chew 1 tablet by mouth 2 (two) times a day.  Marland Kitchen LORazepam (ATIVAN) 0.5 MG tablet Take 1 tablet (0.5 mg total) by mouth 2 (two) times daily. Hold for sedation  . NON FORMULARY Regular, Nectar Thick, Mech Soft Special Instructions: Regular diet with Nectar thick liquids by straw. 2 handled cup and divided plate with built-up utensils for meals.  Marland Kitchen ofloxacin (OCUFLOX) 0.3 % ophthalmic solution 2 drops in the morning and at bedtime.  . sennosides-docusate sodium (SENOKOT-S) 8.6-50 MG tablet Take 2 tablets by mouth at bedtime.   . [DISCONTINUED] propranolol ER (INDERAL LA) 60 MG 24 hr capsule Take 60 mg by mouth every other day.    No facility-administered encounter medications on file as of 04/26/2020.    Review of Systems:  Review of Systems  Unable to perform ROS: Dementia    Health Maintenance  Topic Date Due  . INFLUENZA VACCINE  03/07/2020  . TETANUS/TDAP  10/09/2027  . DEXA SCAN  Completed  . COVID-19 Vaccine  Completed  . PNA vac Low Risk Adult  Completed    Physical Exam: Vitals:   04/26/20 1540  BP: 118/68  Pulse: 75  Resp: 18  Temp: 98 F (36.7 C)  SpO2: 92%  Weight: 98 lb 14.4 oz (44.9 kg)  Height: 5\' 4"  (1.626 m)   Body mass index is 16.98 kg/m.  Physical Exam Vitals reviewed.  Constitutional:      Appearance: Normal appearance.  HENT:     Head: Normocephalic.     Nose: Nose normal.     Mouth/Throat:     Mouth: Mucous membranes are moist.     Pharynx: Oropharynx is clear.  Eyes:     Pupils: Pupils are equal, round, and reactive to light.     Comments: Both Eyes have redness mild with Discharge  Cardiovascular:     Rate and Rhythm: Normal rate. Rhythm irregular.  Pulmonary:     Effort: Pulmonary effort is normal.     Breath sounds: Normal breath sounds.  Abdominal:     General: Abdomen is flat. Bowel sounds are normal.     Palpations:  Abdomen is soft.  Musculoskeletal:        General: No swelling.     Cervical back: Neck supple.  Skin:    General: Skin is warm.  Neurological:     General: No focal deficit present.     Mental Status: She is alert.     Comments: Alert Does not follow commands Walks with her walker  Psychiatric:        Mood and Affect: Mood normal.     Labs reviewed: Basic Metabolic Panel: Recent Labs    05/13/19 0000 05/13/19 0000 05/21/19 0000 10/28/19 0000 01/29/20 0000  NA 142   < > 144 144 145  K 3.8   < > 3.7 4.9 4.3  CL 102   < > 103 105 103  CO2 33   < > 33 33* 34*  BUN 28*   < > 26* 29* 41*  CREATININE 1.1   < > 1.1 1.1 1.2*  CALCIUM 9.5   < > 9.9 9.9 9.7  TSH 1.60  --  0.76  --   --    < > = values in this interval not displayed.   Liver Function Tests: Recent Labs    05/13/19 0000 05/13/19 0000 05/21/19 0000 10/28/19 0000 01/29/20 0000  AST 14   < > 13 15 12*  ALT 6*   < > 5* 6* 7  ALKPHOS 43   < > 41 43 44  PROT 6.3*  --  6.1*  --   --   ALBUMIN 3.8   < > 3.7 3.5 3.6   < > = values in this interval not displayed.   No results for input(s): LIPASE, AMYLASE in the last 8760 hours. No results for input(s): AMMONIA in the last 8760 hours. CBC: Recent Labs    05/21/19 0000 10/28/19 0000 01/29/20 0000  WBC 9.1 8.1 11.9  NEUTROABS 5,651  --  8,342  HGB 12.0 11.4* 12.1  HCT 36 34* 36  PLT 183 186 144*   Lipid Panel: No results for input(s): CHOL, HDL, LDLCALC, TRIG, CHOLHDL, LDLDIRECT in the last 8760 hours. No results found for: HGBA1C  Procedures since last visit: No results found.  Assessment/Plan 1. Acute conjunctivitis of both eyes, unspecified acute conjunctivitis type  Ocuflox QID for 2 weeks  2. Frequent falls Supportive care  3. Chronic a-fib (HCC) Off anticoagulation due to falls  4. Mixed Alzheimer's and vascular dementia (HCC) Enrolled in Hospice  5. Tremor, essential  Discontinue Inderal as to reduce falls.     Labs/tests  ordered:  * No order type specified * Next appt:  Visit date not found

## 2020-04-29 DIAGNOSIS — I509 Heart failure, unspecified: Secondary | ICD-10-CM | POA: Diagnosis not present

## 2020-04-29 DIAGNOSIS — R296 Repeated falls: Secondary | ICD-10-CM | POA: Diagnosis not present

## 2020-04-29 DIAGNOSIS — I130 Hypertensive heart and chronic kidney disease with heart failure and stage 1 through stage 4 chronic kidney disease, or unspecified chronic kidney disease: Secondary | ICD-10-CM | POA: Diagnosis not present

## 2020-04-29 DIAGNOSIS — N183 Chronic kidney disease, stage 3 unspecified: Secondary | ICD-10-CM | POA: Diagnosis not present

## 2020-04-29 DIAGNOSIS — F0280 Dementia in other diseases classified elsewhere without behavioral disturbance: Secondary | ICD-10-CM | POA: Diagnosis not present

## 2020-04-29 DIAGNOSIS — G309 Alzheimer's disease, unspecified: Secondary | ICD-10-CM | POA: Diagnosis not present

## 2020-05-02 DIAGNOSIS — R296 Repeated falls: Secondary | ICD-10-CM | POA: Diagnosis not present

## 2020-05-02 DIAGNOSIS — N183 Chronic kidney disease, stage 3 unspecified: Secondary | ICD-10-CM | POA: Diagnosis not present

## 2020-05-02 DIAGNOSIS — F0280 Dementia in other diseases classified elsewhere without behavioral disturbance: Secondary | ICD-10-CM | POA: Diagnosis not present

## 2020-05-02 DIAGNOSIS — I130 Hypertensive heart and chronic kidney disease with heart failure and stage 1 through stage 4 chronic kidney disease, or unspecified chronic kidney disease: Secondary | ICD-10-CM | POA: Diagnosis not present

## 2020-05-02 DIAGNOSIS — G309 Alzheimer's disease, unspecified: Secondary | ICD-10-CM | POA: Diagnosis not present

## 2020-05-02 DIAGNOSIS — I509 Heart failure, unspecified: Secondary | ICD-10-CM | POA: Diagnosis not present

## 2020-05-03 DIAGNOSIS — I509 Heart failure, unspecified: Secondary | ICD-10-CM | POA: Diagnosis not present

## 2020-05-03 DIAGNOSIS — F0280 Dementia in other diseases classified elsewhere without behavioral disturbance: Secondary | ICD-10-CM | POA: Diagnosis not present

## 2020-05-03 DIAGNOSIS — I130 Hypertensive heart and chronic kidney disease with heart failure and stage 1 through stage 4 chronic kidney disease, or unspecified chronic kidney disease: Secondary | ICD-10-CM | POA: Diagnosis not present

## 2020-05-03 DIAGNOSIS — N183 Chronic kidney disease, stage 3 unspecified: Secondary | ICD-10-CM | POA: Diagnosis not present

## 2020-05-03 DIAGNOSIS — G309 Alzheimer's disease, unspecified: Secondary | ICD-10-CM | POA: Diagnosis not present

## 2020-05-03 DIAGNOSIS — R296 Repeated falls: Secondary | ICD-10-CM | POA: Diagnosis not present

## 2020-05-04 DIAGNOSIS — G309 Alzheimer's disease, unspecified: Secondary | ICD-10-CM | POA: Diagnosis not present

## 2020-05-04 DIAGNOSIS — R296 Repeated falls: Secondary | ICD-10-CM | POA: Diagnosis not present

## 2020-05-04 DIAGNOSIS — I130 Hypertensive heart and chronic kidney disease with heart failure and stage 1 through stage 4 chronic kidney disease, or unspecified chronic kidney disease: Secondary | ICD-10-CM | POA: Diagnosis not present

## 2020-05-04 DIAGNOSIS — N183 Chronic kidney disease, stage 3 unspecified: Secondary | ICD-10-CM | POA: Diagnosis not present

## 2020-05-04 DIAGNOSIS — F0280 Dementia in other diseases classified elsewhere without behavioral disturbance: Secondary | ICD-10-CM | POA: Diagnosis not present

## 2020-05-04 DIAGNOSIS — I509 Heart failure, unspecified: Secondary | ICD-10-CM | POA: Diagnosis not present

## 2020-05-06 DIAGNOSIS — G309 Alzheimer's disease, unspecified: Secondary | ICD-10-CM | POA: Diagnosis not present

## 2020-05-06 DIAGNOSIS — F0280 Dementia in other diseases classified elsewhere without behavioral disturbance: Secondary | ICD-10-CM | POA: Diagnosis not present

## 2020-05-06 DIAGNOSIS — N183 Chronic kidney disease, stage 3 unspecified: Secondary | ICD-10-CM | POA: Diagnosis not present

## 2020-05-06 DIAGNOSIS — R296 Repeated falls: Secondary | ICD-10-CM | POA: Diagnosis not present

## 2020-05-06 DIAGNOSIS — I509 Heart failure, unspecified: Secondary | ICD-10-CM | POA: Diagnosis not present

## 2020-05-06 DIAGNOSIS — I130 Hypertensive heart and chronic kidney disease with heart failure and stage 1 through stage 4 chronic kidney disease, or unspecified chronic kidney disease: Secondary | ICD-10-CM | POA: Diagnosis not present

## 2020-05-07 DIAGNOSIS — Z681 Body mass index (BMI) 19 or less, adult: Secondary | ICD-10-CM | POA: Diagnosis not present

## 2020-05-07 DIAGNOSIS — I7300 Raynaud's syndrome without gangrene: Secondary | ICD-10-CM | POA: Diagnosis not present

## 2020-05-07 DIAGNOSIS — F0280 Dementia in other diseases classified elsewhere without behavioral disturbance: Secondary | ICD-10-CM | POA: Diagnosis not present

## 2020-05-07 DIAGNOSIS — H01003 Unspecified blepharitis right eye, unspecified eyelid: Secondary | ICD-10-CM | POA: Diagnosis not present

## 2020-05-07 DIAGNOSIS — R32 Unspecified urinary incontinence: Secondary | ICD-10-CM | POA: Diagnosis not present

## 2020-05-07 DIAGNOSIS — R159 Full incontinence of feces: Secondary | ICD-10-CM | POA: Diagnosis not present

## 2020-05-07 DIAGNOSIS — G309 Alzheimer's disease, unspecified: Secondary | ICD-10-CM | POA: Diagnosis not present

## 2020-05-07 DIAGNOSIS — I4891 Unspecified atrial fibrillation: Secondary | ICD-10-CM | POA: Diagnosis not present

## 2020-05-07 DIAGNOSIS — R296 Repeated falls: Secondary | ICD-10-CM | POA: Diagnosis not present

## 2020-05-07 DIAGNOSIS — F418 Other specified anxiety disorders: Secondary | ICD-10-CM | POA: Diagnosis not present

## 2020-05-07 DIAGNOSIS — Z741 Need for assistance with personal care: Secondary | ICD-10-CM | POA: Diagnosis not present

## 2020-05-07 DIAGNOSIS — S22080D Wedge compression fracture of T11-T12 vertebra, subsequent encounter for fracture with routine healing: Secondary | ICD-10-CM | POA: Diagnosis not present

## 2020-05-07 DIAGNOSIS — D631 Anemia in chronic kidney disease: Secondary | ICD-10-CM | POA: Diagnosis not present

## 2020-05-07 DIAGNOSIS — I509 Heart failure, unspecified: Secondary | ICD-10-CM | POA: Diagnosis not present

## 2020-05-07 DIAGNOSIS — E46 Unspecified protein-calorie malnutrition: Secondary | ICD-10-CM | POA: Diagnosis not present

## 2020-05-07 DIAGNOSIS — Z947 Corneal transplant status: Secondary | ICD-10-CM | POA: Diagnosis not present

## 2020-05-07 DIAGNOSIS — G250 Essential tremor: Secondary | ICD-10-CM | POA: Diagnosis not present

## 2020-05-07 DIAGNOSIS — Z87440 Personal history of urinary (tract) infections: Secondary | ICD-10-CM | POA: Diagnosis not present

## 2020-05-07 DIAGNOSIS — S0181XD Laceration without foreign body of other part of head, subsequent encounter: Secondary | ICD-10-CM | POA: Diagnosis not present

## 2020-05-07 DIAGNOSIS — I130 Hypertensive heart and chronic kidney disease with heart failure and stage 1 through stage 4 chronic kidney disease, or unspecified chronic kidney disease: Secondary | ICD-10-CM | POA: Diagnosis not present

## 2020-05-07 DIAGNOSIS — N183 Chronic kidney disease, stage 3 unspecified: Secondary | ICD-10-CM | POA: Diagnosis not present

## 2020-05-09 DIAGNOSIS — R296 Repeated falls: Secondary | ICD-10-CM | POA: Diagnosis not present

## 2020-05-09 DIAGNOSIS — I130 Hypertensive heart and chronic kidney disease with heart failure and stage 1 through stage 4 chronic kidney disease, or unspecified chronic kidney disease: Secondary | ICD-10-CM | POA: Diagnosis not present

## 2020-05-09 DIAGNOSIS — F0280 Dementia in other diseases classified elsewhere without behavioral disturbance: Secondary | ICD-10-CM | POA: Diagnosis not present

## 2020-05-09 DIAGNOSIS — G309 Alzheimer's disease, unspecified: Secondary | ICD-10-CM | POA: Diagnosis not present

## 2020-05-09 DIAGNOSIS — I509 Heart failure, unspecified: Secondary | ICD-10-CM | POA: Diagnosis not present

## 2020-05-09 DIAGNOSIS — N183 Chronic kidney disease, stage 3 unspecified: Secondary | ICD-10-CM | POA: Diagnosis not present

## 2020-05-11 DIAGNOSIS — N183 Chronic kidney disease, stage 3 unspecified: Secondary | ICD-10-CM | POA: Diagnosis not present

## 2020-05-11 DIAGNOSIS — I130 Hypertensive heart and chronic kidney disease with heart failure and stage 1 through stage 4 chronic kidney disease, or unspecified chronic kidney disease: Secondary | ICD-10-CM | POA: Diagnosis not present

## 2020-05-11 DIAGNOSIS — G309 Alzheimer's disease, unspecified: Secondary | ICD-10-CM | POA: Diagnosis not present

## 2020-05-11 DIAGNOSIS — F0280 Dementia in other diseases classified elsewhere without behavioral disturbance: Secondary | ICD-10-CM | POA: Diagnosis not present

## 2020-05-11 DIAGNOSIS — R296 Repeated falls: Secondary | ICD-10-CM | POA: Diagnosis not present

## 2020-05-11 DIAGNOSIS — I509 Heart failure, unspecified: Secondary | ICD-10-CM | POA: Diagnosis not present

## 2020-05-12 ENCOUNTER — Non-Acute Institutional Stay (SKILLED_NURSING_FACILITY): Payer: Medicare Other | Admitting: Nurse Practitioner

## 2020-05-12 ENCOUNTER — Encounter: Payer: Self-pay | Admitting: Nurse Practitioner

## 2020-05-12 DIAGNOSIS — F339 Major depressive disorder, recurrent, unspecified: Secondary | ICD-10-CM | POA: Diagnosis not present

## 2020-05-12 DIAGNOSIS — M545 Low back pain, unspecified: Secondary | ICD-10-CM | POA: Diagnosis not present

## 2020-05-12 DIAGNOSIS — R627 Adult failure to thrive: Secondary | ICD-10-CM

## 2020-05-12 DIAGNOSIS — G309 Alzheimer's disease, unspecified: Secondary | ICD-10-CM

## 2020-05-12 DIAGNOSIS — F0280 Dementia in other diseases classified elsewhere without behavioral disturbance: Secondary | ICD-10-CM

## 2020-05-12 DIAGNOSIS — G250 Essential tremor: Secondary | ICD-10-CM

## 2020-05-12 DIAGNOSIS — F0150 Vascular dementia without behavioral disturbance: Secondary | ICD-10-CM | POA: Diagnosis not present

## 2020-05-12 DIAGNOSIS — K5901 Slow transit constipation: Secondary | ICD-10-CM

## 2020-05-12 NOTE — Progress Notes (Signed)
Location:   [facility] Room Number: 31 Place of Service:  SNF (31) Provider:  Chipper Oman, NP  Mahlon Gammon, MD  Patient Care Team: Mahlon Gammon, MD as PCP - General (Internal Medicine) Nahser, Deloris Ping, MD as PCP - Cardiology (Cardiology) Toluwanimi Radebaugh X, NP as Nurse Practitioner (Internal Medicine)  Extended Emergency Contact Information Primary Emergency Contact: Morgan,Tom Address: [address]          [place], [place] [address] [place] Home Phone: [phone] Relation: Friend Secondary Emergency Contact: Fletcher,Christa  [place] Home Phone: [phone] Relation: Niece  Code Status:  DNR  Goals of care: Advanced Directive information Advanced Directives 05/12/2020  Does Patient Have a Medical Advance Directive? Yes  Type of Estate agent of [place];Out of facility DNR (pink MOST or yellow form)  Does patient want to make changes to medical advance directive? No - Patient declined  Copy of Healthcare Power of Attorney in Chart? Yes - validated most recent copy scanned in chart (See row information)  Would patient like information on creating a medical advance directive? -  Pre-existing out of facility DNR order (yellow form or pink MOST form) Yellow form placed in chart (order not valid for inpatient use)     Chief Complaint  Patient presents with  . Medical Management of Chronic Issues    Routine follow up visit  . Best Practice Recommendations    Flu vaccine    HPI:  Pt is a 86 y.o. female seen today for medical management of chronic diseases.    Advanced dementia, no safety awareness Worsened gait, frailty, wobbly gait, frequent falls Essential tremor, dependent of ADLs, off  Propranolol Anxiety/depression, able to redirected with activities,  Lorazepam 0.5mg  bid prn-x1 used in the past 30 days.              Chronic lower back pain,  takes Tylenol 1000mg  bid             Constipation, takes Senokot S II qhs.   Past Medical History:  Diagnosis Date  . Anticoagulant long-term use   . Atrial fibrillation (HCC)   . CHF (congestive heart failure) (HCC)   . Chronic anticoagulation   . Hypertension   . Raynaud phenomenon   . Tremor, essential    Past Surgical History:  Procedure Laterality Date  . CHOLECYSTECTOMY    . TONSILLECTOMY      Allergies  Allergen Reactions  . Fosamax [Alendronate]   . Other Nausea And Vomiting    Green pepper    Allergies as of 05/12/2020      Reactions   Fosamax [alendronate]    Other Nausea And Vomiting   Green pepper      Medication List       Accurate as of May 12, 2020 11:59 PM. If you have any questions, ask your nurse or doctor.        acetaminophen 500 MG tablet Commonly known as: TYLENOL Take 500 mg by mouth every 8 (eight) hours as needed for mild pain or moderate pain.   acetaminophen 500 MG tablet Commonly known as: TYLENOL Take 1,000 mg by mouth 2 (two) times daily. Not to exceed 3,00 mg daily.   Caltrate 600+D Plus Minerals 600-800 MG-UNIT Chew Chew 1 tablet by mouth 2 (two) times a day.   LORazepam 0.5 MG tablet Commonly known as: ATIVAN Take 1 tablet (0.5 mg total) by mouth 2 (two) times  daily. Hold for sedation   NON FORMULARY Regular, Nectar Thick, Mech Soft Special Instructions: Regular diet with Nectar thick liquids by straw. 2 handled cup and divided plate with built-up utensils for meals.   sennosides-docusate sodium 8.6-50 MG tablet Commonly known as: SENOKOT-S Take 2 tablets by mouth at bedtime.       Review of Systems  Constitutional: Positive for unexpected weight change. Negative for fatigue and fever.       #5Ibs weight loss in the past month.   HENT: Positive for hearing loss. Negative for congestion and voice change.   Eyes: Negative for visual disturbance.  Respiratory: Negative for shortness of breath.   Cardiovascular:  Negative for leg swelling.  Gastrointestinal: Negative for abdominal pain and constipation.  Genitourinary: Negative for difficulty urinating, dysuria and urgency.  Musculoskeletal: Positive for arthralgias, back pain and gait problem.  Skin: Positive for wound. Negative for color change.  Neurological: Positive for tremors. Negative for speech difficulty and headaches.       Dementia  Psychiatric/Behavioral: Positive for agitation, behavioral problems, confusion and sleep disturbance. The patient is nervous/anxious.     Immunization History  Administered Date(s) Administered  . Influenza Whole 05/09/2018  . Influenza, High Dose Seasonal PF 05/09/2019  . Influenza-Unspecified 05/28/2017  . Moderna SARS-COVID-2 Vaccination 08/09/2019, 09/06/2019  . Pneumococcal Conjugate-13 07/13/2017  . Pneumococcal Polysaccharide-23 04/07/2002  . Tdap 07/13/2017, 10/08/2017   Pertinent  Health Maintenance Due  Topic Date Due  . INFLUENZA VACCINE  03/07/2020  . DEXA SCAN  Completed  . PNA vac Low Risk Adult  Completed   Fall Risk  03/10/2019 10/05/2017  Falls in the past year? (No Data) No  Comment Emmi Telephone Survey: data to providers prior to load -  Number falls in past yr: (No Data) -  Comment Emmi Telephone Survey Actual Response =  -   Functional Status Survey:    Vitals:   05/12/20 1014  BP: 116/60  Pulse: 60  Resp: 20  Temp: (!) 97.5 F (36.4 C)  SpO2: 91%  Weight: 93 lb 4.8 oz (42.3 kg)  Height: 5\' 4"  (1.626 m)   Body mass index is 16.01 kg/m. Physical Exam Vitals and nursing note reviewed.  Constitutional:      Appearance: Normal appearance.     Comments: frail  HENT:     Head: Normocephalic and atraumatic.     Mouth/Throat:     Mouth: Mucous membranes are moist.  Eyes:     Extraocular Movements: Extraocular movements intact.     Conjunctiva/sclera: Conjunctivae normal.     Pupils: Pupils are equal, round, and reactive to light.  Cardiovascular:     Rate and  Rhythm: Normal rate. Rhythm irregular.     Heart sounds: No murmur heard.   Pulmonary:     Breath sounds: No rales.  Abdominal:     Palpations: Abdomen is soft.     Tenderness: There is no abdominal tenderness. There is no right CVA tenderness or left CVA tenderness.  Musculoskeletal:     Cervical back: Normal range of motion and neck supple.     Right lower leg: No edema.     Left lower leg: No edema.  Skin:    General: Skin is warm and dry.     Comments: Right parietal scalp laceration is well approximated with steri strips, no active bleeding or s/s of infection.   Neurological:     General: No focal deficit present.     Mental Status: She is  alert. Mental status is at baseline.     Coordination: Coordination abnormal.     Gait: Gait abnormal.     Comments: Tremor in fingers are worsened. Oriented to self.   Psychiatric:     Comments: Confused.      Labs reviewed: Recent Labs    05/21/19 0000 10/28/19 0000 01/29/20 0000  NA 144 144 145  K 3.7 4.9 4.3  CL 103 105 103  CO2 33 33* 34*  BUN 26* 29* 41*  CREATININE 1.1 1.1 1.2*  CALCIUM 9.9 9.9 9.7   Recent Labs    05/21/19 0000 10/28/19 0000 01/29/20 0000  AST 13 15 12*  ALT 5* 6* 7  ALKPHOS 41 43 44  PROT 6.1*  --   --   ALBUMIN 3.7 3.5 3.6   Recent Labs    05/21/19 0000 10/28/19 0000 01/29/20 0000  WBC 9.1 8.1 11.9  NEUTROABS 5,651  --  8,342  HGB 12.0 11.4* 12.1  HCT 36 34* 36  PLT 183 186 144*   Lab Results  Component Value Date   TSH 0.76 05/21/2019   No results found for: HGBA1C No results found for: CHOL, HDL, LDLCALC, LDLDIRECT, TRIG, CHOLHDL  Significant Diagnostic Results in last 30 days:  No results found.  Assessment/Plan Slow transit constipation Stable, continue Senokot S II qhs.   Tremor, essential Persisted, off inderal, dependent of ADL  Adult failure to thrive Continue gradual weight loss, persisted anxiety, progressing of dementia. Continue supportive care in SNF [facility],  continue Hospice service.   Depression, recurrent (HCC) Anxiety/depression, able to be redirected with activities, will renew Lorazepam 0.5mg  qd prn x 14 days prn-x1 used in the past 30 days.    Lower back pain  Chronic lower back pain, takes Tylenol 1000mg  bid   Mixed Alzheimer's and vascular dementia (HCC) Advance, continue supportive care in SNF [facility]     Family/ staff Communication: plan of care reviewed with the patient and charge nurse.   Labs/tests ordered:  none  Time spend 25 minutes.

## 2020-05-12 NOTE — Assessment & Plan Note (Signed)
Persisted, off inderal, dependent of ADL

## 2020-05-12 NOTE — Assessment & Plan Note (Signed)
Chronic lower back pain, takes Tylenol 1000mg bid  

## 2020-05-12 NOTE — Assessment & Plan Note (Addendum)
Anxiety/depression, able to be redirected with activities, will renew Lorazepam 0.5mg  qd prn x 14 days prn-x1 used in the past 30 days.

## 2020-05-12 NOTE — Assessment & Plan Note (Signed)
Stable, continue Senokot S II qhs. 

## 2020-05-12 NOTE — Assessment & Plan Note (Signed)
Continue gradual weight loss, persisted anxiety, progressing of dementia. Continue supportive care in SNF [facility], continue Hospice service.

## 2020-05-12 NOTE — Assessment & Plan Note (Signed)
Advance, continue supportive care in SNF [facility]

## 2020-05-13 DIAGNOSIS — R296 Repeated falls: Secondary | ICD-10-CM | POA: Diagnosis not present

## 2020-05-13 DIAGNOSIS — F0280 Dementia in other diseases classified elsewhere without behavioral disturbance: Secondary | ICD-10-CM | POA: Diagnosis not present

## 2020-05-13 DIAGNOSIS — I509 Heart failure, unspecified: Secondary | ICD-10-CM | POA: Diagnosis not present

## 2020-05-13 DIAGNOSIS — N183 Chronic kidney disease, stage 3 unspecified: Secondary | ICD-10-CM | POA: Diagnosis not present

## 2020-05-13 DIAGNOSIS — I130 Hypertensive heart and chronic kidney disease with heart failure and stage 1 through stage 4 chronic kidney disease, or unspecified chronic kidney disease: Secondary | ICD-10-CM | POA: Diagnosis not present

## 2020-05-13 DIAGNOSIS — G309 Alzheimer's disease, unspecified: Secondary | ICD-10-CM | POA: Diagnosis not present

## 2020-05-16 DIAGNOSIS — R296 Repeated falls: Secondary | ICD-10-CM | POA: Diagnosis not present

## 2020-05-16 DIAGNOSIS — N183 Chronic kidney disease, stage 3 unspecified: Secondary | ICD-10-CM | POA: Diagnosis not present

## 2020-05-16 DIAGNOSIS — I509 Heart failure, unspecified: Secondary | ICD-10-CM | POA: Diagnosis not present

## 2020-05-16 DIAGNOSIS — F0280 Dementia in other diseases classified elsewhere without behavioral disturbance: Secondary | ICD-10-CM | POA: Diagnosis not present

## 2020-05-16 DIAGNOSIS — G309 Alzheimer's disease, unspecified: Secondary | ICD-10-CM | POA: Diagnosis not present

## 2020-05-16 DIAGNOSIS — I130 Hypertensive heart and chronic kidney disease with heart failure and stage 1 through stage 4 chronic kidney disease, or unspecified chronic kidney disease: Secondary | ICD-10-CM | POA: Diagnosis not present

## 2020-05-18 DIAGNOSIS — I130 Hypertensive heart and chronic kidney disease with heart failure and stage 1 through stage 4 chronic kidney disease, or unspecified chronic kidney disease: Secondary | ICD-10-CM | POA: Diagnosis not present

## 2020-05-18 DIAGNOSIS — I509 Heart failure, unspecified: Secondary | ICD-10-CM | POA: Diagnosis not present

## 2020-05-18 DIAGNOSIS — F0280 Dementia in other diseases classified elsewhere without behavioral disturbance: Secondary | ICD-10-CM | POA: Diagnosis not present

## 2020-05-18 DIAGNOSIS — G309 Alzheimer's disease, unspecified: Secondary | ICD-10-CM | POA: Diagnosis not present

## 2020-05-18 DIAGNOSIS — N183 Chronic kidney disease, stage 3 unspecified: Secondary | ICD-10-CM | POA: Diagnosis not present

## 2020-05-18 DIAGNOSIS — R296 Repeated falls: Secondary | ICD-10-CM | POA: Diagnosis not present

## 2020-05-20 DIAGNOSIS — R296 Repeated falls: Secondary | ICD-10-CM | POA: Diagnosis not present

## 2020-05-20 DIAGNOSIS — I130 Hypertensive heart and chronic kidney disease with heart failure and stage 1 through stage 4 chronic kidney disease, or unspecified chronic kidney disease: Secondary | ICD-10-CM | POA: Diagnosis not present

## 2020-05-20 DIAGNOSIS — N183 Chronic kidney disease, stage 3 unspecified: Secondary | ICD-10-CM | POA: Diagnosis not present

## 2020-05-20 DIAGNOSIS — F0280 Dementia in other diseases classified elsewhere without behavioral disturbance: Secondary | ICD-10-CM | POA: Diagnosis not present

## 2020-05-20 DIAGNOSIS — G309 Alzheimer's disease, unspecified: Secondary | ICD-10-CM | POA: Diagnosis not present

## 2020-05-20 DIAGNOSIS — I509 Heart failure, unspecified: Secondary | ICD-10-CM | POA: Diagnosis not present

## 2020-05-23 DIAGNOSIS — I509 Heart failure, unspecified: Secondary | ICD-10-CM | POA: Diagnosis not present

## 2020-05-23 DIAGNOSIS — F0280 Dementia in other diseases classified elsewhere without behavioral disturbance: Secondary | ICD-10-CM | POA: Diagnosis not present

## 2020-05-23 DIAGNOSIS — R296 Repeated falls: Secondary | ICD-10-CM | POA: Diagnosis not present

## 2020-05-23 DIAGNOSIS — N183 Chronic kidney disease, stage 3 unspecified: Secondary | ICD-10-CM | POA: Diagnosis not present

## 2020-05-23 DIAGNOSIS — I130 Hypertensive heart and chronic kidney disease with heart failure and stage 1 through stage 4 chronic kidney disease, or unspecified chronic kidney disease: Secondary | ICD-10-CM | POA: Diagnosis not present

## 2020-05-23 DIAGNOSIS — G309 Alzheimer's disease, unspecified: Secondary | ICD-10-CM | POA: Diagnosis not present

## 2020-05-27 DIAGNOSIS — N183 Chronic kidney disease, stage 3 unspecified: Secondary | ICD-10-CM | POA: Diagnosis not present

## 2020-05-27 DIAGNOSIS — R296 Repeated falls: Secondary | ICD-10-CM | POA: Diagnosis not present

## 2020-05-27 DIAGNOSIS — F0280 Dementia in other diseases classified elsewhere without behavioral disturbance: Secondary | ICD-10-CM | POA: Diagnosis not present

## 2020-05-27 DIAGNOSIS — I509 Heart failure, unspecified: Secondary | ICD-10-CM | POA: Diagnosis not present

## 2020-05-27 DIAGNOSIS — G309 Alzheimer's disease, unspecified: Secondary | ICD-10-CM | POA: Diagnosis not present

## 2020-05-27 DIAGNOSIS — I130 Hypertensive heart and chronic kidney disease with heart failure and stage 1 through stage 4 chronic kidney disease, or unspecified chronic kidney disease: Secondary | ICD-10-CM | POA: Diagnosis not present

## 2020-05-28 DIAGNOSIS — I509 Heart failure, unspecified: Secondary | ICD-10-CM | POA: Diagnosis not present

## 2020-05-28 DIAGNOSIS — F0280 Dementia in other diseases classified elsewhere without behavioral disturbance: Secondary | ICD-10-CM | POA: Diagnosis not present

## 2020-05-28 DIAGNOSIS — N183 Chronic kidney disease, stage 3 unspecified: Secondary | ICD-10-CM | POA: Diagnosis not present

## 2020-05-28 DIAGNOSIS — G309 Alzheimer's disease, unspecified: Secondary | ICD-10-CM | POA: Diagnosis not present

## 2020-05-28 DIAGNOSIS — R296 Repeated falls: Secondary | ICD-10-CM | POA: Diagnosis not present

## 2020-05-28 DIAGNOSIS — I130 Hypertensive heart and chronic kidney disease with heart failure and stage 1 through stage 4 chronic kidney disease, or unspecified chronic kidney disease: Secondary | ICD-10-CM | POA: Diagnosis not present

## 2020-05-30 DIAGNOSIS — I130 Hypertensive heart and chronic kidney disease with heart failure and stage 1 through stage 4 chronic kidney disease, or unspecified chronic kidney disease: Secondary | ICD-10-CM | POA: Diagnosis not present

## 2020-05-30 DIAGNOSIS — F0280 Dementia in other diseases classified elsewhere without behavioral disturbance: Secondary | ICD-10-CM | POA: Diagnosis not present

## 2020-05-30 DIAGNOSIS — R296 Repeated falls: Secondary | ICD-10-CM | POA: Diagnosis not present

## 2020-05-30 DIAGNOSIS — N183 Chronic kidney disease, stage 3 unspecified: Secondary | ICD-10-CM | POA: Diagnosis not present

## 2020-05-30 DIAGNOSIS — I509 Heart failure, unspecified: Secondary | ICD-10-CM | POA: Diagnosis not present

## 2020-05-30 DIAGNOSIS — G309 Alzheimer's disease, unspecified: Secondary | ICD-10-CM | POA: Diagnosis not present

## 2020-06-01 ENCOUNTER — Other Ambulatory Visit: Payer: Self-pay | Admitting: *Deleted

## 2020-06-01 MED ORDER — LORAZEPAM 0.5 MG PO TABS: 0.5000 mg | ORAL_TABLET | Freq: Every day | ORAL | 0 refills | Status: DC | PRN

## 2020-06-01 NOTE — Telephone Encounter (Signed)
Received Refill Request from [facility].  Pended Rx and sent to Dr. Gupta for approval 

## 2020-06-02 DIAGNOSIS — G309 Alzheimer's disease, unspecified: Secondary | ICD-10-CM | POA: Diagnosis not present

## 2020-06-02 DIAGNOSIS — I509 Heart failure, unspecified: Secondary | ICD-10-CM | POA: Diagnosis not present

## 2020-06-02 DIAGNOSIS — N183 Chronic kidney disease, stage 3 unspecified: Secondary | ICD-10-CM | POA: Diagnosis not present

## 2020-06-02 DIAGNOSIS — I130 Hypertensive heart and chronic kidney disease with heart failure and stage 1 through stage 4 chronic kidney disease, or unspecified chronic kidney disease: Secondary | ICD-10-CM | POA: Diagnosis not present

## 2020-06-02 DIAGNOSIS — F0280 Dementia in other diseases classified elsewhere without behavioral disturbance: Secondary | ICD-10-CM | POA: Diagnosis not present

## 2020-06-02 DIAGNOSIS — R296 Repeated falls: Secondary | ICD-10-CM | POA: Diagnosis not present

## 2020-06-03 DIAGNOSIS — I509 Heart failure, unspecified: Secondary | ICD-10-CM | POA: Diagnosis not present

## 2020-06-03 DIAGNOSIS — I130 Hypertensive heart and chronic kidney disease with heart failure and stage 1 through stage 4 chronic kidney disease, or unspecified chronic kidney disease: Secondary | ICD-10-CM | POA: Diagnosis not present

## 2020-06-03 DIAGNOSIS — R296 Repeated falls: Secondary | ICD-10-CM | POA: Diagnosis not present

## 2020-06-03 DIAGNOSIS — G309 Alzheimer's disease, unspecified: Secondary | ICD-10-CM | POA: Diagnosis not present

## 2020-06-03 DIAGNOSIS — F0280 Dementia in other diseases classified elsewhere without behavioral disturbance: Secondary | ICD-10-CM | POA: Diagnosis not present

## 2020-06-03 DIAGNOSIS — N183 Chronic kidney disease, stage 3 unspecified: Secondary | ICD-10-CM | POA: Diagnosis not present

## 2020-06-06 DIAGNOSIS — G309 Alzheimer's disease, unspecified: Secondary | ICD-10-CM | POA: Diagnosis not present

## 2020-06-06 DIAGNOSIS — R296 Repeated falls: Secondary | ICD-10-CM | POA: Diagnosis not present

## 2020-06-06 DIAGNOSIS — F0280 Dementia in other diseases classified elsewhere without behavioral disturbance: Secondary | ICD-10-CM | POA: Diagnosis not present

## 2020-06-06 DIAGNOSIS — N183 Chronic kidney disease, stage 3 unspecified: Secondary | ICD-10-CM | POA: Diagnosis not present

## 2020-06-06 DIAGNOSIS — I130 Hypertensive heart and chronic kidney disease with heart failure and stage 1 through stage 4 chronic kidney disease, or unspecified chronic kidney disease: Secondary | ICD-10-CM | POA: Diagnosis not present

## 2020-06-06 DIAGNOSIS — I509 Heart failure, unspecified: Secondary | ICD-10-CM | POA: Diagnosis not present

## 2020-06-07 DIAGNOSIS — Z947 Corneal transplant status: Secondary | ICD-10-CM | POA: Diagnosis not present

## 2020-06-07 DIAGNOSIS — Z993 Dependence on wheelchair: Secondary | ICD-10-CM | POA: Diagnosis not present

## 2020-06-07 DIAGNOSIS — F0280 Dementia in other diseases classified elsewhere without behavioral disturbance: Secondary | ICD-10-CM | POA: Diagnosis not present

## 2020-06-07 DIAGNOSIS — R159 Full incontinence of feces: Secondary | ICD-10-CM | POA: Diagnosis not present

## 2020-06-07 DIAGNOSIS — I509 Heart failure, unspecified: Secondary | ICD-10-CM | POA: Diagnosis not present

## 2020-06-07 DIAGNOSIS — Z87440 Personal history of urinary (tract) infections: Secondary | ICD-10-CM | POA: Diagnosis not present

## 2020-06-07 DIAGNOSIS — F418 Other specified anxiety disorders: Secondary | ICD-10-CM | POA: Diagnosis not present

## 2020-06-07 DIAGNOSIS — D631 Anemia in chronic kidney disease: Secondary | ICD-10-CM | POA: Diagnosis not present

## 2020-06-07 DIAGNOSIS — N183 Chronic kidney disease, stage 3 unspecified: Secondary | ICD-10-CM | POA: Diagnosis not present

## 2020-06-07 DIAGNOSIS — E46 Unspecified protein-calorie malnutrition: Secondary | ICD-10-CM | POA: Diagnosis not present

## 2020-06-07 DIAGNOSIS — G309 Alzheimer's disease, unspecified: Secondary | ICD-10-CM | POA: Diagnosis not present

## 2020-06-07 DIAGNOSIS — S0181XD Laceration without foreign body of other part of head, subsequent encounter: Secondary | ICD-10-CM | POA: Diagnosis not present

## 2020-06-07 DIAGNOSIS — R296 Repeated falls: Secondary | ICD-10-CM | POA: Diagnosis not present

## 2020-06-07 DIAGNOSIS — I4891 Unspecified atrial fibrillation: Secondary | ICD-10-CM | POA: Diagnosis not present

## 2020-06-07 DIAGNOSIS — I7300 Raynaud's syndrome without gangrene: Secondary | ICD-10-CM | POA: Diagnosis not present

## 2020-06-07 DIAGNOSIS — Z741 Need for assistance with personal care: Secondary | ICD-10-CM | POA: Diagnosis not present

## 2020-06-07 DIAGNOSIS — G250 Essential tremor: Secondary | ICD-10-CM | POA: Diagnosis not present

## 2020-06-07 DIAGNOSIS — S22080D Wedge compression fracture of T11-T12 vertebra, subsequent encounter for fracture with routine healing: Secondary | ICD-10-CM | POA: Diagnosis not present

## 2020-06-07 DIAGNOSIS — H01003 Unspecified blepharitis right eye, unspecified eyelid: Secondary | ICD-10-CM | POA: Diagnosis not present

## 2020-06-07 DIAGNOSIS — I130 Hypertensive heart and chronic kidney disease with heart failure and stage 1 through stage 4 chronic kidney disease, or unspecified chronic kidney disease: Secondary | ICD-10-CM | POA: Diagnosis not present

## 2020-06-07 DIAGNOSIS — R32 Unspecified urinary incontinence: Secondary | ICD-10-CM | POA: Diagnosis not present

## 2020-06-07 DIAGNOSIS — Z681 Body mass index (BMI) 19 or less, adult: Secondary | ICD-10-CM | POA: Diagnosis not present

## 2020-06-10 DIAGNOSIS — G309 Alzheimer's disease, unspecified: Secondary | ICD-10-CM | POA: Diagnosis not present

## 2020-06-10 DIAGNOSIS — I130 Hypertensive heart and chronic kidney disease with heart failure and stage 1 through stage 4 chronic kidney disease, or unspecified chronic kidney disease: Secondary | ICD-10-CM | POA: Diagnosis not present

## 2020-06-10 DIAGNOSIS — R296 Repeated falls: Secondary | ICD-10-CM | POA: Diagnosis not present

## 2020-06-10 DIAGNOSIS — I509 Heart failure, unspecified: Secondary | ICD-10-CM | POA: Diagnosis not present

## 2020-06-10 DIAGNOSIS — F0280 Dementia in other diseases classified elsewhere without behavioral disturbance: Secondary | ICD-10-CM | POA: Diagnosis not present

## 2020-06-10 DIAGNOSIS — N183 Chronic kidney disease, stage 3 unspecified: Secondary | ICD-10-CM | POA: Diagnosis not present

## 2020-06-11 ENCOUNTER — Other Ambulatory Visit: Payer: Self-pay

## 2020-06-11 MED ORDER — LORAZEPAM 0.5 MG PO TABS: 0.5000 mg | ORAL_TABLET | Freq: Every day | ORAL | 0 refills | Status: DC | PRN

## 2020-06-13 DIAGNOSIS — G309 Alzheimer's disease, unspecified: Secondary | ICD-10-CM | POA: Diagnosis not present

## 2020-06-13 DIAGNOSIS — N183 Chronic kidney disease, stage 3 unspecified: Secondary | ICD-10-CM | POA: Diagnosis not present

## 2020-06-13 DIAGNOSIS — I130 Hypertensive heart and chronic kidney disease with heart failure and stage 1 through stage 4 chronic kidney disease, or unspecified chronic kidney disease: Secondary | ICD-10-CM | POA: Diagnosis not present

## 2020-06-13 DIAGNOSIS — R296 Repeated falls: Secondary | ICD-10-CM | POA: Diagnosis not present

## 2020-06-13 DIAGNOSIS — F0280 Dementia in other diseases classified elsewhere without behavioral disturbance: Secondary | ICD-10-CM | POA: Diagnosis not present

## 2020-06-13 DIAGNOSIS — I509 Heart failure, unspecified: Secondary | ICD-10-CM | POA: Diagnosis not present

## 2020-06-14 DIAGNOSIS — R296 Repeated falls: Secondary | ICD-10-CM | POA: Diagnosis not present

## 2020-06-14 DIAGNOSIS — F0280 Dementia in other diseases classified elsewhere without behavioral disturbance: Secondary | ICD-10-CM | POA: Diagnosis not present

## 2020-06-14 DIAGNOSIS — I130 Hypertensive heart and chronic kidney disease with heart failure and stage 1 through stage 4 chronic kidney disease, or unspecified chronic kidney disease: Secondary | ICD-10-CM | POA: Diagnosis not present

## 2020-06-14 DIAGNOSIS — G309 Alzheimer's disease, unspecified: Secondary | ICD-10-CM | POA: Diagnosis not present

## 2020-06-14 DIAGNOSIS — I509 Heart failure, unspecified: Secondary | ICD-10-CM | POA: Diagnosis not present

## 2020-06-14 DIAGNOSIS — N183 Chronic kidney disease, stage 3 unspecified: Secondary | ICD-10-CM | POA: Diagnosis not present

## 2020-06-15 DIAGNOSIS — Z23 Encounter for immunization: Secondary | ICD-10-CM | POA: Diagnosis not present

## 2020-06-17 DIAGNOSIS — G309 Alzheimer's disease, unspecified: Secondary | ICD-10-CM | POA: Diagnosis not present

## 2020-06-17 DIAGNOSIS — N183 Chronic kidney disease, stage 3 unspecified: Secondary | ICD-10-CM | POA: Diagnosis not present

## 2020-06-17 DIAGNOSIS — F0280 Dementia in other diseases classified elsewhere without behavioral disturbance: Secondary | ICD-10-CM | POA: Diagnosis not present

## 2020-06-17 DIAGNOSIS — R296 Repeated falls: Secondary | ICD-10-CM | POA: Diagnosis not present

## 2020-06-17 DIAGNOSIS — I509 Heart failure, unspecified: Secondary | ICD-10-CM | POA: Diagnosis not present

## 2020-06-17 DIAGNOSIS — I130 Hypertensive heart and chronic kidney disease with heart failure and stage 1 through stage 4 chronic kidney disease, or unspecified chronic kidney disease: Secondary | ICD-10-CM | POA: Diagnosis not present

## 2020-06-18 ENCOUNTER — Non-Acute Institutional Stay (SKILLED_NURSING_FACILITY): Payer: Medicare Other | Admitting: Nurse Practitioner

## 2020-06-18 ENCOUNTER — Encounter: Payer: Self-pay | Admitting: Nurse Practitioner

## 2020-06-18 DIAGNOSIS — F339 Major depressive disorder, recurrent, unspecified: Secondary | ICD-10-CM | POA: Diagnosis not present

## 2020-06-18 DIAGNOSIS — F0150 Vascular dementia without behavioral disturbance: Secondary | ICD-10-CM | POA: Diagnosis not present

## 2020-06-18 DIAGNOSIS — R269 Unspecified abnormalities of gait and mobility: Secondary | ICD-10-CM | POA: Diagnosis not present

## 2020-06-18 DIAGNOSIS — F0280 Dementia in other diseases classified elsewhere without behavioral disturbance: Secondary | ICD-10-CM

## 2020-06-18 DIAGNOSIS — M545 Low back pain, unspecified: Secondary | ICD-10-CM

## 2020-06-18 DIAGNOSIS — G250 Essential tremor: Secondary | ICD-10-CM

## 2020-06-18 DIAGNOSIS — K5901 Slow transit constipation: Secondary | ICD-10-CM

## 2020-06-18 DIAGNOSIS — G309 Alzheimer's disease, unspecified: Secondary | ICD-10-CM

## 2020-06-18 NOTE — Assessment & Plan Note (Signed)
Worsened gait, frailty, wobbly gait, frequent falls 

## 2020-06-18 NOTE — Assessment & Plan Note (Signed)
Anxiety/depression, able to redirected with activities, prn Lorazepam. 

## 2020-06-18 NOTE — Assessment & Plan Note (Signed)
Essential tremor,dependent of ADLs, offPropranolol  

## 2020-06-18 NOTE — Assessment & Plan Note (Signed)
Advanced dementia, no safety awareness  

## 2020-06-18 NOTE — Assessment & Plan Note (Signed)
Constipation, takes Senokot SII qhs. 

## 2020-06-18 NOTE — Assessment & Plan Note (Signed)
Chronic lower back pain, takes Tylenol 1000mg bid  

## 2020-06-18 NOTE — Progress Notes (Signed)
Location:   [facility] Room Number: 31 Place of Service:  SNF (31) Provider:  Sydell Axon, Arna Snipe NP  Mahlon Gammon, MD  Patient Care Team: Mahlon Gammon, MD as PCP - General (Internal Medicine) Nahser, Deloris Ping, MD as PCP - Cardiology (Cardiology) Tresean Mattix X, NP as Nurse Practitioner (Internal Medicine)  Extended Emergency Contact Information Primary Emergency Contact: Morgan,Tom Address: [address]          [place], [place] [address] [place] Home Phone: [phone] Relation: Friend Secondary Emergency Contact: Fletcher,Christa  [place] Home Phone: [phone] Relation: Niece  Code Status:  DNR Goals of care: Advanced Directive information Advanced Directives 06/18/2020  Does Patient Have a Medical Advance Directive? Yes  Type of Advance Directive Out of facility DNR (pink MOST or yellow form);Healthcare Power of Attorney  Does patient want to make changes to medical advance directive? No - Patient declined  Copy of Healthcare Power of Attorney in Chart? Yes - validated most recent copy scanned in chart (See row information)  Would patient like information on creating a medical advance directive? -  Pre-existing out of facility DNR order (yellow form or pink MOST form) Yellow form placed in chart (order not valid for inpatient use)     Chief Complaint  Patient presents with  . Medical Management of Chronic Issues    HPI:  Pt is a 86 y.o. female seen today for medical management of chronic diseases.                    Advanced dementia, no safety awareness Worsened gait, frailty, wobbly gait, frequent falls Essential tremor,dependent of ADLs, offPropranolol Anxiety/depression, able to redirected with activities, prn Lorazepam.  Chronic lower back pain, takes Tylenol 1000mg  bid Constipation, takes Senokot S II qhs.     Past Medical  History:  Diagnosis Date  . Anticoagulant long-term use   . Atrial fibrillation (HCC)   . CHF (congestive heart failure) (HCC)   . Chronic anticoagulation   . Hypertension   . Raynaud phenomenon   . Tremor, essential    Past Surgical History:  Procedure Laterality Date  . CHOLECYSTECTOMY    . TONSILLECTOMY      Allergies  Allergen Reactions  . Fosamax [Alendronate]   . Other Nausea And Vomiting    Green pepper    Allergies as of 06/18/2020      Reactions   Fosamax [alendronate]    Other Nausea And Vomiting   Green pepper      Medication List       Accurate as of June 18, 2020 11:59 PM. If you have any questions, ask your nurse or doctor.        acetaminophen 500 MG tablet Commonly known as: TYLENOL Take 500 mg by mouth every 8 (eight) hours as needed for mild pain or moderate pain.   acetaminophen 500 MG tablet Commonly known as: TYLENOL Take 1,000 mg by mouth 2 (two) times daily. Not to exceed 3,00 mg daily.   Caltrate 600+D Plus Minerals 600-800 MG-UNIT Chew Chew 1 tablet by mouth 2 (two) times a day.   LORazepam 0.5 MG tablet Commonly known as: ATIVAN Take 1 tablet (0.5 mg total) by mouth daily as needed for anxiety. For 14 days. Hold for sedation   magnesium hydroxide 400 MG/5ML suspension Commonly known as: MILK OF MAGNESIA Take by mouth every other day.   NON FORMULARY Regular, Nectar Thick, Mech Soft  Special Instructions: Regular diet with Nectar thick liquids by straw. 2 handled cup and divided plate with built-up utensils for meals.   sennosides-docusate sodium 8.6-50 MG tablet Commonly known as: SENOKOT-S Take 2 tablets by mouth at bedtime.       Review of Systems  Constitutional: Negative for fatigue, fever and unexpected weight change.       #5Ibs weight loss in the past month.   HENT: Positive for hearing loss. Negative for congestion and voice change.   Eyes: Negative for visual disturbance.  Respiratory: Negative for shortness  of breath.   Cardiovascular: Negative for leg swelling.  Gastrointestinal: Negative for abdominal pain and constipation.  Genitourinary: Negative for difficulty urinating, dysuria and urgency.  Musculoskeletal: Positive for arthralgias, back pain and gait problem.  Skin: Negative for color change.  Neurological: Positive for tremors. Negative for speech difficulty and headaches.       Dementia  Psychiatric/Behavioral: Positive for agitation, behavioral problems, confusion and sleep disturbance. The patient is nervous/anxious.     Immunization History  Administered Date(s) Administered  . Influenza Whole 05/09/2018  . Influenza, High Dose Seasonal PF 05/09/2019  . Influenza-Unspecified 05/28/2017, 05/19/2020  . Moderna SARS-COVID-2 Vaccination 08/09/2019, 09/06/2019  . Pneumococcal Conjugate-13 07/13/2017  . Pneumococcal Polysaccharide-23 04/07/2002  . Tdap 07/13/2017, 10/08/2017   Pertinent  Health Maintenance Due  Topic Date Due  . INFLUENZA VACCINE  Completed  . DEXA SCAN  Completed  . PNA vac Low Risk Adult  Completed   Fall Risk  03/10/2019 10/05/2017  Falls in the past year? (No Data) No  Comment Emmi Telephone Survey: data to providers prior to load -  Number falls in past yr: (No Data) -  Comment Emmi Telephone Survey Actual Response =  -   Functional Status Survey:    Vitals:   06/18/20 1119  BP: 122/64  Pulse: 100  Resp: 20  Temp: 97.7 F (36.5 C)  SpO2: 92%  Weight: 101 lb 1.6 oz (45.9 kg)  Height: 5\' 4"  (1.626 m)   Body mass index is 17.35 kg/m. Physical Exam Vitals and nursing note reviewed.  Constitutional:      Appearance: Normal appearance.     Comments: frail  HENT:     Head: Normocephalic and atraumatic.     Mouth/Throat:     Mouth: Mucous membranes are moist.  Eyes:     Extraocular Movements: Extraocular movements intact.     Conjunctiva/sclera: Conjunctivae normal.     Pupils: Pupils are equal, round, and reactive to light.  Cardiovascular:      Rate and Rhythm: Normal rate. Rhythm irregular.     Heart sounds: No murmur heard.   Pulmonary:     Breath sounds: No rales.  Abdominal:     General: Bowel sounds are normal.     Palpations: Abdomen is soft.     Tenderness: There is no abdominal tenderness.  Musculoskeletal:     Cervical back: Normal range of motion and neck supple.     Right lower leg: No edema.     Left lower leg: No edema.  Skin:    General: Skin is warm and dry.  Neurological:     General: No focal deficit present.     Mental Status: She is alert. Mental status is at baseline.     Coordination: Coordination abnormal.     Gait: Gait abnormal.     Comments: Tremor in fingers are worsened. Oriented to self.   Psychiatric:     Comments: Confused.  Labs reviewed: Recent Labs    10/28/19 0000 01/29/20 0000  NA 144 145  K 4.9 4.3  CL 105 103  CO2 33* 34*  BUN 29* 41*  CREATININE 1.1 1.2*  CALCIUM 9.9 9.7   Recent Labs    10/28/19 0000 01/29/20 0000  AST 15 12*  ALT 6* 7  ALKPHOS 43 44  ALBUMIN 3.5 3.6   Recent Labs    10/28/19 0000 01/29/20 0000  WBC 8.1 11.9  NEUTROABS  --  8,342  HGB 11.4* 12.1  HCT 34* 36  PLT 186 144*   Lab Results  Component Value Date   TSH 0.76 05/21/2019   No results found for: HGBA1C No results found for: CHOL, HDL, LDLCALC, LDLDIRECT, TRIG, CHOLHDL  Significant Diagnostic Results in last 30 days:  No results found.  Assessment/Plan Mixed Alzheimer's and vascular dementia (HCC) Advanced dementia, no safety awareness   Gait abnormality Worsened gait, frailty, wobbly gait, frequent falls  Tremor, essential Essential tremor,dependent of ADLs, offPropranolol   Depression, recurrent (HCC) Anxiety/depression, able to redirected with activities, prn Lorazepam.   Lower back pain Chronic lower back pain, takes Tylenol 1000mg  bid   Slow transit constipation Constipation, takes Senokot S II qhs.        Family/ staff Communication:  plan of care reviewed with the patient and charge nurse.   Labs/tests ordered:  None  Time spend 25 minutes.

## 2020-06-20 DIAGNOSIS — N183 Chronic kidney disease, stage 3 unspecified: Secondary | ICD-10-CM | POA: Diagnosis not present

## 2020-06-20 DIAGNOSIS — I130 Hypertensive heart and chronic kidney disease with heart failure and stage 1 through stage 4 chronic kidney disease, or unspecified chronic kidney disease: Secondary | ICD-10-CM | POA: Diagnosis not present

## 2020-06-20 DIAGNOSIS — R296 Repeated falls: Secondary | ICD-10-CM | POA: Diagnosis not present

## 2020-06-20 DIAGNOSIS — G309 Alzheimer's disease, unspecified: Secondary | ICD-10-CM | POA: Diagnosis not present

## 2020-06-20 DIAGNOSIS — I509 Heart failure, unspecified: Secondary | ICD-10-CM | POA: Diagnosis not present

## 2020-06-20 DIAGNOSIS — F0280 Dementia in other diseases classified elsewhere without behavioral disturbance: Secondary | ICD-10-CM | POA: Diagnosis not present

## 2020-06-22 ENCOUNTER — Encounter: Payer: Self-pay | Admitting: Nurse Practitioner

## 2020-06-24 DIAGNOSIS — N183 Chronic kidney disease, stage 3 unspecified: Secondary | ICD-10-CM | POA: Diagnosis not present

## 2020-06-24 DIAGNOSIS — I130 Hypertensive heart and chronic kidney disease with heart failure and stage 1 through stage 4 chronic kidney disease, or unspecified chronic kidney disease: Secondary | ICD-10-CM | POA: Diagnosis not present

## 2020-06-24 DIAGNOSIS — F0280 Dementia in other diseases classified elsewhere without behavioral disturbance: Secondary | ICD-10-CM | POA: Diagnosis not present

## 2020-06-24 DIAGNOSIS — I509 Heart failure, unspecified: Secondary | ICD-10-CM | POA: Diagnosis not present

## 2020-06-24 DIAGNOSIS — R296 Repeated falls: Secondary | ICD-10-CM | POA: Diagnosis not present

## 2020-06-24 DIAGNOSIS — G309 Alzheimer's disease, unspecified: Secondary | ICD-10-CM | POA: Diagnosis not present

## 2020-06-25 DIAGNOSIS — F0280 Dementia in other diseases classified elsewhere without behavioral disturbance: Secondary | ICD-10-CM | POA: Diagnosis not present

## 2020-06-25 DIAGNOSIS — I130 Hypertensive heart and chronic kidney disease with heart failure and stage 1 through stage 4 chronic kidney disease, or unspecified chronic kidney disease: Secondary | ICD-10-CM | POA: Diagnosis not present

## 2020-06-25 DIAGNOSIS — G309 Alzheimer's disease, unspecified: Secondary | ICD-10-CM | POA: Diagnosis not present

## 2020-06-25 DIAGNOSIS — R296 Repeated falls: Secondary | ICD-10-CM | POA: Diagnosis not present

## 2020-06-25 DIAGNOSIS — N183 Chronic kidney disease, stage 3 unspecified: Secondary | ICD-10-CM | POA: Diagnosis not present

## 2020-06-25 DIAGNOSIS — I509 Heart failure, unspecified: Secondary | ICD-10-CM | POA: Diagnosis not present

## 2020-06-27 DIAGNOSIS — N183 Chronic kidney disease, stage 3 unspecified: Secondary | ICD-10-CM | POA: Diagnosis not present

## 2020-06-27 DIAGNOSIS — G309 Alzheimer's disease, unspecified: Secondary | ICD-10-CM | POA: Diagnosis not present

## 2020-06-27 DIAGNOSIS — R296 Repeated falls: Secondary | ICD-10-CM | POA: Diagnosis not present

## 2020-06-27 DIAGNOSIS — I130 Hypertensive heart and chronic kidney disease with heart failure and stage 1 through stage 4 chronic kidney disease, or unspecified chronic kidney disease: Secondary | ICD-10-CM | POA: Diagnosis not present

## 2020-06-27 DIAGNOSIS — F0280 Dementia in other diseases classified elsewhere without behavioral disturbance: Secondary | ICD-10-CM | POA: Diagnosis not present

## 2020-06-27 DIAGNOSIS — I509 Heart failure, unspecified: Secondary | ICD-10-CM | POA: Diagnosis not present

## 2020-06-28 DIAGNOSIS — G309 Alzheimer's disease, unspecified: Secondary | ICD-10-CM | POA: Diagnosis not present

## 2020-06-28 DIAGNOSIS — F0280 Dementia in other diseases classified elsewhere without behavioral disturbance: Secondary | ICD-10-CM | POA: Diagnosis not present

## 2020-06-28 DIAGNOSIS — N183 Chronic kidney disease, stage 3 unspecified: Secondary | ICD-10-CM | POA: Diagnosis not present

## 2020-06-28 DIAGNOSIS — I509 Heart failure, unspecified: Secondary | ICD-10-CM | POA: Diagnosis not present

## 2020-06-28 DIAGNOSIS — I130 Hypertensive heart and chronic kidney disease with heart failure and stage 1 through stage 4 chronic kidney disease, or unspecified chronic kidney disease: Secondary | ICD-10-CM | POA: Diagnosis not present

## 2020-06-28 DIAGNOSIS — R296 Repeated falls: Secondary | ICD-10-CM | POA: Diagnosis not present

## 2020-07-01 DIAGNOSIS — I509 Heart failure, unspecified: Secondary | ICD-10-CM | POA: Diagnosis not present

## 2020-07-01 DIAGNOSIS — I130 Hypertensive heart and chronic kidney disease with heart failure and stage 1 through stage 4 chronic kidney disease, or unspecified chronic kidney disease: Secondary | ICD-10-CM | POA: Diagnosis not present

## 2020-07-01 DIAGNOSIS — N183 Chronic kidney disease, stage 3 unspecified: Secondary | ICD-10-CM | POA: Diagnosis not present

## 2020-07-01 DIAGNOSIS — R296 Repeated falls: Secondary | ICD-10-CM | POA: Diagnosis not present

## 2020-07-01 DIAGNOSIS — G309 Alzheimer's disease, unspecified: Secondary | ICD-10-CM | POA: Diagnosis not present

## 2020-07-01 DIAGNOSIS — F0280 Dementia in other diseases classified elsewhere without behavioral disturbance: Secondary | ICD-10-CM | POA: Diagnosis not present

## 2020-07-04 DIAGNOSIS — N183 Chronic kidney disease, stage 3 unspecified: Secondary | ICD-10-CM | POA: Diagnosis not present

## 2020-07-04 DIAGNOSIS — I509 Heart failure, unspecified: Secondary | ICD-10-CM | POA: Diagnosis not present

## 2020-07-04 DIAGNOSIS — R296 Repeated falls: Secondary | ICD-10-CM | POA: Diagnosis not present

## 2020-07-04 DIAGNOSIS — F0280 Dementia in other diseases classified elsewhere without behavioral disturbance: Secondary | ICD-10-CM | POA: Diagnosis not present

## 2020-07-04 DIAGNOSIS — G309 Alzheimer's disease, unspecified: Secondary | ICD-10-CM | POA: Diagnosis not present

## 2020-07-04 DIAGNOSIS — I130 Hypertensive heart and chronic kidney disease with heart failure and stage 1 through stage 4 chronic kidney disease, or unspecified chronic kidney disease: Secondary | ICD-10-CM | POA: Diagnosis not present

## 2020-07-07 DIAGNOSIS — Z87440 Personal history of urinary (tract) infections: Secondary | ICD-10-CM | POA: Diagnosis not present

## 2020-07-07 DIAGNOSIS — I4891 Unspecified atrial fibrillation: Secondary | ICD-10-CM | POA: Diagnosis not present

## 2020-07-07 DIAGNOSIS — F0280 Dementia in other diseases classified elsewhere without behavioral disturbance: Secondary | ICD-10-CM | POA: Diagnosis not present

## 2020-07-07 DIAGNOSIS — F418 Other specified anxiety disorders: Secondary | ICD-10-CM | POA: Diagnosis not present

## 2020-07-07 DIAGNOSIS — R159 Full incontinence of feces: Secondary | ICD-10-CM | POA: Diagnosis not present

## 2020-07-07 DIAGNOSIS — R296 Repeated falls: Secondary | ICD-10-CM | POA: Diagnosis not present

## 2020-07-07 DIAGNOSIS — Z993 Dependence on wheelchair: Secondary | ICD-10-CM | POA: Diagnosis not present

## 2020-07-07 DIAGNOSIS — Z681 Body mass index (BMI) 19 or less, adult: Secondary | ICD-10-CM | POA: Diagnosis not present

## 2020-07-07 DIAGNOSIS — H01003 Unspecified blepharitis right eye, unspecified eyelid: Secondary | ICD-10-CM | POA: Diagnosis not present

## 2020-07-07 DIAGNOSIS — E46 Unspecified protein-calorie malnutrition: Secondary | ICD-10-CM | POA: Diagnosis not present

## 2020-07-07 DIAGNOSIS — I7300 Raynaud's syndrome without gangrene: Secondary | ICD-10-CM | POA: Diagnosis not present

## 2020-07-07 DIAGNOSIS — I130 Hypertensive heart and chronic kidney disease with heart failure and stage 1 through stage 4 chronic kidney disease, or unspecified chronic kidney disease: Secondary | ICD-10-CM | POA: Diagnosis not present

## 2020-07-07 DIAGNOSIS — N183 Chronic kidney disease, stage 3 unspecified: Secondary | ICD-10-CM | POA: Diagnosis not present

## 2020-07-07 DIAGNOSIS — Z741 Need for assistance with personal care: Secondary | ICD-10-CM | POA: Diagnosis not present

## 2020-07-07 DIAGNOSIS — R32 Unspecified urinary incontinence: Secondary | ICD-10-CM | POA: Diagnosis not present

## 2020-07-07 DIAGNOSIS — Z947 Corneal transplant status: Secondary | ICD-10-CM | POA: Diagnosis not present

## 2020-07-07 DIAGNOSIS — I509 Heart failure, unspecified: Secondary | ICD-10-CM | POA: Diagnosis not present

## 2020-07-07 DIAGNOSIS — S22080D Wedge compression fracture of T11-T12 vertebra, subsequent encounter for fracture with routine healing: Secondary | ICD-10-CM | POA: Diagnosis not present

## 2020-07-07 DIAGNOSIS — S0181XD Laceration without foreign body of other part of head, subsequent encounter: Secondary | ICD-10-CM | POA: Diagnosis not present

## 2020-07-07 DIAGNOSIS — G309 Alzheimer's disease, unspecified: Secondary | ICD-10-CM | POA: Diagnosis not present

## 2020-07-07 DIAGNOSIS — D631 Anemia in chronic kidney disease: Secondary | ICD-10-CM | POA: Diagnosis not present

## 2020-07-07 DIAGNOSIS — G250 Essential tremor: Secondary | ICD-10-CM | POA: Diagnosis not present

## 2020-07-08 DIAGNOSIS — R296 Repeated falls: Secondary | ICD-10-CM | POA: Diagnosis not present

## 2020-07-08 DIAGNOSIS — N183 Chronic kidney disease, stage 3 unspecified: Secondary | ICD-10-CM | POA: Diagnosis not present

## 2020-07-08 DIAGNOSIS — I509 Heart failure, unspecified: Secondary | ICD-10-CM | POA: Diagnosis not present

## 2020-07-08 DIAGNOSIS — I130 Hypertensive heart and chronic kidney disease with heart failure and stage 1 through stage 4 chronic kidney disease, or unspecified chronic kidney disease: Secondary | ICD-10-CM | POA: Diagnosis not present

## 2020-07-08 DIAGNOSIS — F0280 Dementia in other diseases classified elsewhere without behavioral disturbance: Secondary | ICD-10-CM | POA: Diagnosis not present

## 2020-07-08 DIAGNOSIS — G309 Alzheimer's disease, unspecified: Secondary | ICD-10-CM | POA: Diagnosis not present

## 2020-07-11 DIAGNOSIS — N183 Chronic kidney disease, stage 3 unspecified: Secondary | ICD-10-CM | POA: Diagnosis not present

## 2020-07-11 DIAGNOSIS — R296 Repeated falls: Secondary | ICD-10-CM | POA: Diagnosis not present

## 2020-07-11 DIAGNOSIS — I509 Heart failure, unspecified: Secondary | ICD-10-CM | POA: Diagnosis not present

## 2020-07-11 DIAGNOSIS — G309 Alzheimer's disease, unspecified: Secondary | ICD-10-CM | POA: Diagnosis not present

## 2020-07-11 DIAGNOSIS — I130 Hypertensive heart and chronic kidney disease with heart failure and stage 1 through stage 4 chronic kidney disease, or unspecified chronic kidney disease: Secondary | ICD-10-CM | POA: Diagnosis not present

## 2020-07-11 DIAGNOSIS — F0280 Dementia in other diseases classified elsewhere without behavioral disturbance: Secondary | ICD-10-CM | POA: Diagnosis not present

## 2020-07-12 ENCOUNTER — Encounter: Payer: Self-pay | Admitting: Internal Medicine

## 2020-07-12 ENCOUNTER — Non-Acute Institutional Stay (SKILLED_NURSING_FACILITY): Payer: Medicare Other | Admitting: Internal Medicine

## 2020-07-12 DIAGNOSIS — F339 Major depressive disorder, recurrent, unspecified: Secondary | ICD-10-CM | POA: Diagnosis not present

## 2020-07-12 DIAGNOSIS — G309 Alzheimer's disease, unspecified: Secondary | ICD-10-CM | POA: Diagnosis not present

## 2020-07-12 DIAGNOSIS — R627 Adult failure to thrive: Secondary | ICD-10-CM

## 2020-07-12 DIAGNOSIS — R269 Unspecified abnormalities of gait and mobility: Secondary | ICD-10-CM | POA: Diagnosis not present

## 2020-07-12 DIAGNOSIS — F0150 Vascular dementia without behavioral disturbance: Secondary | ICD-10-CM | POA: Diagnosis not present

## 2020-07-12 DIAGNOSIS — G250 Essential tremor: Secondary | ICD-10-CM | POA: Diagnosis not present

## 2020-07-12 DIAGNOSIS — I482 Chronic atrial fibrillation, unspecified: Secondary | ICD-10-CM

## 2020-07-12 DIAGNOSIS — F0280 Dementia in other diseases classified elsewhere without behavioral disturbance: Secondary | ICD-10-CM

## 2020-07-12 DIAGNOSIS — N183 Chronic kidney disease, stage 3 unspecified: Secondary | ICD-10-CM | POA: Diagnosis not present

## 2020-07-12 DIAGNOSIS — I130 Hypertensive heart and chronic kidney disease with heart failure and stage 1 through stage 4 chronic kidney disease, or unspecified chronic kidney disease: Secondary | ICD-10-CM | POA: Diagnosis not present

## 2020-07-12 DIAGNOSIS — R296 Repeated falls: Secondary | ICD-10-CM | POA: Diagnosis not present

## 2020-07-12 DIAGNOSIS — I509 Heart failure, unspecified: Secondary | ICD-10-CM | POA: Diagnosis not present

## 2020-07-12 NOTE — Progress Notes (Signed)
Location:   [facility] Room Number: 31 Place of Service:  SNF [phone]) Provider:  Einar Crow MD  Mahlon Gammon, MD  Patient Care Team: Mahlon Gammon, MD as PCP - General (Internal Medicine) Nahser, Deloris Ping, MD as PCP - Cardiology (Cardiology) Mast, Man X, NP as Nurse Practitioner (Internal Medicine)  Extended Emergency Contact Information Primary Emergency Contact: Morgan,Tom Address: [address]          [place], [place] [address] [place] Home Phone: [phone] Relation: Friend Secondary Emergency Contact: Fletcher,Christa  [place] Home Phone: [phone] Relation: Niece  Code Status:  DNR Goals of care: Advanced Directive information Advanced Directives 07/12/2020  Does Patient Have a Medical Advance Directive? Yes  Type of Advance Directive Out of facility DNR (pink MOST or yellow form);Healthcare Power of Attorney  Does patient want to make changes to medical advance directive? No - Patient declined  Copy of Healthcare Power of Attorney in Chart? Yes - validated most recent copy scanned in chart (See row information)  Would patient like information on creating a medical advance directive? -  Pre-existing out of facility DNR order (yellow form or pink MOST form) Yellow form placed in chart (order not valid for inpatient use)     Chief Complaint  Patient presents with  . Medical Management of Chronic Issues    HPI:  Pt is a 86 y.o. female seen today for medical management of chronic diseases.    Patient has h/o Atrial Fibrillation,LE edema, Hypertension, Dysphagia,Essential Tremor, Anemia, Dementia,and depression, FallsWeight loss Patient is a long-term resident  She is enrolled in hospice Weight has stabilized. No Recent falls.  No New Nursing issues Cannot give much history due to Dementia Wheelchair dependnet. And Has aphsia Past Medical History:  Diagnosis Date  . Anticoagulant  long-term use   . Atrial fibrillation (HCC)   . CHF (congestive heart failure) (HCC)   . Chronic anticoagulation   . Hypertension   . Raynaud phenomenon   . Tremor, essential    Past Surgical History:  Procedure Laterality Date  . CHOLECYSTECTOMY    . TONSILLECTOMY      Allergies  Allergen Reactions  . Fosamax [Alendronate]   . Other Nausea And Vomiting    Green pepper    Allergies as of 07/12/2020      Reactions   Fosamax [alendronate]    Other Nausea And Vomiting   Green pepper      Medication List       Accurate as of July 12, 2020  9:37 AM. If you have any questions, ask your nurse or doctor.        acetaminophen 500 MG tablet Commonly known as: TYLENOL Take 500 mg by mouth every 8 (eight) hours as needed for mild pain or moderate pain.   acetaminophen 500 MG tablet Commonly known as: TYLENOL Take 1,000 mg by mouth 2 (two) times daily. Not to exceed 3,00 mg daily.   Caltrate 600+D Plus Minerals 600-800 MG-UNIT Chew Chew 1 tablet by mouth 2 (two) times a day.   LORazepam 0.5 MG tablet Commonly known as: ATIVAN Take 1 tablet (0.5 mg total) by mouth daily as needed for anxiety. For 14 days. Hold for sedation   magnesium hydroxide 400 MG/5ML suspension Commonly known as: MILK OF MAGNESIA Take by mouth every other day.   NON FORMULARY Regular, Nectar Thick, Mech Soft Special Instructions: Regular diet with Nectar thick liquids by straw. 2  handled cup and divided plate with built-up utensils for meals.   sennosides-docusate sodium 8.6-50 MG tablet Commonly known as: SENOKOT-S Take 2 tablets by mouth at bedtime.       Review of Systems  Unable to perform ROS: Dementia    Immunization History  Administered Date(s) Administered  . Influenza Whole 05/09/2018  . Influenza, High Dose Seasonal PF 05/09/2019  . Influenza-Unspecified 05/28/2017, 05/19/2020  . Moderna SARS-COVID-2 Vaccination 08/09/2019, 09/06/2019  . Pneumococcal Conjugate-13  07/13/2017  . Pneumococcal Polysaccharide-23 04/07/2002  . Tdap 07/13/2017, 10/08/2017   Pertinent  Health Maintenance Due  Topic Date Due  . INFLUENZA VACCINE  Completed  . DEXA SCAN  Completed  . PNA vac Low Risk Adult  Completed   Fall Risk  03/10/2019 10/05/2017  Falls in the past year? (No Data) No  Comment Emmi Telephone Survey: data to providers prior to load -  Number falls in past yr: (No Data) -  Comment Emmi Telephone Survey Actual Response =  -   Functional Status Survey:    Vitals:   07/12/20 0933  BP: (!) 144/76  Pulse: 88  Resp: 20  Temp: (!) 96.9 F (36.1 C)  SpO2: 92%  Weight: 100 lb 11.2 oz (45.7 kg)  Height: 5\' 4"  (1.626 m)   Body mass index is 17.29 kg/m. Physical Exam Vitals reviewed.  Constitutional:      Comments: Very frail  HENT:     Head: Normocephalic.     Nose: Nose normal.     Mouth/Throat:     Mouth: Mucous membranes are moist.     Pharynx: Oropharynx is clear.  Eyes:     Pupils: Pupils are equal, round, and reactive to light.  Cardiovascular:     Rate and Rhythm: Normal rate. Rhythm irregular.     Pulses: Normal pulses.     Heart sounds: Normal heart sounds.  Pulmonary:     Effort: Pulmonary effort is normal.     Breath sounds: Normal breath sounds.  Abdominal:     General: Abdomen is flat. Bowel sounds are normal.     Palpations: Abdomen is soft.  Musculoskeletal:        General: No swelling.     Cervical back: Neck supple.  Skin:    General: Skin is warm.  Neurological:     General: No focal deficit present.     Mental Status: She is alert.  Psychiatric:        Mood and Affect: Mood normal.        Thought Content: Thought content normal.     Labs reviewed: Recent Labs    10/28/19 0000 01/29/20 0000  NA 144 145  K 4.9 4.3  CL 105 103  CO2 33* 34*  BUN 29* 41*  CREATININE 1.1 1.2*  CALCIUM 9.9 9.7   Recent Labs    10/28/19 0000 01/29/20 0000  AST 15 12*  ALT 6* 7  ALKPHOS 43 44  ALBUMIN 3.5 3.6    Recent Labs    10/28/19 0000 01/29/20 0000  WBC 8.1 11.9  NEUTROABS  --  8,342  HGB 11.4* 12.1  HCT 34* 36  PLT 186 144*   Lab Results  Component Value Date   TSH 0.76 05/21/2019   No results found for: HGBA1C No results found for: CHOL, HDL, LDLCALC, LDLDIRECT, TRIG, CHOLHDL  Significant Diagnostic Results in last 30 days:  No results found.  Assessment/Plan Mixed Alzheimer's and vascular dementia (HCC) Is on Ativan PRN Supportive therapy Gait  abnormality Continues to stay Fall risk Loose control of BP Depression, recurrent (HCC) Continue on Ativan PRN Tremor, essential Off Propanol  Doing fine Adult failure to thrive Weight is stable Enrolled in Hospice Chronic a-fib (HCC) Off Eliquis die to falls    Family/ staff Communication:   Labs/tests ordered:

## 2020-07-15 DIAGNOSIS — N183 Chronic kidney disease, stage 3 unspecified: Secondary | ICD-10-CM | POA: Diagnosis not present

## 2020-07-15 DIAGNOSIS — I130 Hypertensive heart and chronic kidney disease with heart failure and stage 1 through stage 4 chronic kidney disease, or unspecified chronic kidney disease: Secondary | ICD-10-CM | POA: Diagnosis not present

## 2020-07-15 DIAGNOSIS — F0280 Dementia in other diseases classified elsewhere without behavioral disturbance: Secondary | ICD-10-CM | POA: Diagnosis not present

## 2020-07-15 DIAGNOSIS — G309 Alzheimer's disease, unspecified: Secondary | ICD-10-CM | POA: Diagnosis not present

## 2020-07-15 DIAGNOSIS — I509 Heart failure, unspecified: Secondary | ICD-10-CM | POA: Diagnosis not present

## 2020-07-15 DIAGNOSIS — R296 Repeated falls: Secondary | ICD-10-CM | POA: Diagnosis not present

## 2020-07-16 DIAGNOSIS — F0280 Dementia in other diseases classified elsewhere without behavioral disturbance: Secondary | ICD-10-CM | POA: Diagnosis not present

## 2020-07-16 DIAGNOSIS — G309 Alzheimer's disease, unspecified: Secondary | ICD-10-CM | POA: Diagnosis not present

## 2020-07-16 DIAGNOSIS — N183 Chronic kidney disease, stage 3 unspecified: Secondary | ICD-10-CM | POA: Diagnosis not present

## 2020-07-16 DIAGNOSIS — R296 Repeated falls: Secondary | ICD-10-CM | POA: Diagnosis not present

## 2020-07-16 DIAGNOSIS — I509 Heart failure, unspecified: Secondary | ICD-10-CM | POA: Diagnosis not present

## 2020-07-16 DIAGNOSIS — I130 Hypertensive heart and chronic kidney disease with heart failure and stage 1 through stage 4 chronic kidney disease, or unspecified chronic kidney disease: Secondary | ICD-10-CM | POA: Diagnosis not present

## 2020-07-18 DIAGNOSIS — G309 Alzheimer's disease, unspecified: Secondary | ICD-10-CM | POA: Diagnosis not present

## 2020-07-18 DIAGNOSIS — I130 Hypertensive heart and chronic kidney disease with heart failure and stage 1 through stage 4 chronic kidney disease, or unspecified chronic kidney disease: Secondary | ICD-10-CM | POA: Diagnosis not present

## 2020-07-18 DIAGNOSIS — F0280 Dementia in other diseases classified elsewhere without behavioral disturbance: Secondary | ICD-10-CM | POA: Diagnosis not present

## 2020-07-18 DIAGNOSIS — N183 Chronic kidney disease, stage 3 unspecified: Secondary | ICD-10-CM | POA: Diagnosis not present

## 2020-07-18 DIAGNOSIS — I509 Heart failure, unspecified: Secondary | ICD-10-CM | POA: Diagnosis not present

## 2020-07-18 DIAGNOSIS — R296 Repeated falls: Secondary | ICD-10-CM | POA: Diagnosis not present

## 2020-07-19 DIAGNOSIS — I509 Heart failure, unspecified: Secondary | ICD-10-CM | POA: Diagnosis not present

## 2020-07-19 DIAGNOSIS — N183 Chronic kidney disease, stage 3 unspecified: Secondary | ICD-10-CM | POA: Diagnosis not present

## 2020-07-19 DIAGNOSIS — I130 Hypertensive heart and chronic kidney disease with heart failure and stage 1 through stage 4 chronic kidney disease, or unspecified chronic kidney disease: Secondary | ICD-10-CM | POA: Diagnosis not present

## 2020-07-19 DIAGNOSIS — R296 Repeated falls: Secondary | ICD-10-CM | POA: Diagnosis not present

## 2020-07-19 DIAGNOSIS — F0280 Dementia in other diseases classified elsewhere without behavioral disturbance: Secondary | ICD-10-CM | POA: Diagnosis not present

## 2020-07-19 DIAGNOSIS — G309 Alzheimer's disease, unspecified: Secondary | ICD-10-CM | POA: Diagnosis not present

## 2020-07-20 DIAGNOSIS — I130 Hypertensive heart and chronic kidney disease with heart failure and stage 1 through stage 4 chronic kidney disease, or unspecified chronic kidney disease: Secondary | ICD-10-CM | POA: Diagnosis not present

## 2020-07-20 DIAGNOSIS — G309 Alzheimer's disease, unspecified: Secondary | ICD-10-CM | POA: Diagnosis not present

## 2020-07-20 DIAGNOSIS — R296 Repeated falls: Secondary | ICD-10-CM | POA: Diagnosis not present

## 2020-07-20 DIAGNOSIS — N183 Chronic kidney disease, stage 3 unspecified: Secondary | ICD-10-CM | POA: Diagnosis not present

## 2020-07-20 DIAGNOSIS — I509 Heart failure, unspecified: Secondary | ICD-10-CM | POA: Diagnosis not present

## 2020-07-20 DIAGNOSIS — F0280 Dementia in other diseases classified elsewhere without behavioral disturbance: Secondary | ICD-10-CM | POA: Diagnosis not present

## 2020-07-22 DIAGNOSIS — N183 Chronic kidney disease, stage 3 unspecified: Secondary | ICD-10-CM | POA: Diagnosis not present

## 2020-07-22 DIAGNOSIS — F0280 Dementia in other diseases classified elsewhere without behavioral disturbance: Secondary | ICD-10-CM | POA: Diagnosis not present

## 2020-07-22 DIAGNOSIS — I509 Heart failure, unspecified: Secondary | ICD-10-CM | POA: Diagnosis not present

## 2020-07-22 DIAGNOSIS — G309 Alzheimer's disease, unspecified: Secondary | ICD-10-CM | POA: Diagnosis not present

## 2020-07-22 DIAGNOSIS — I130 Hypertensive heart and chronic kidney disease with heart failure and stage 1 through stage 4 chronic kidney disease, or unspecified chronic kidney disease: Secondary | ICD-10-CM | POA: Diagnosis not present

## 2020-07-22 DIAGNOSIS — R296 Repeated falls: Secondary | ICD-10-CM | POA: Diagnosis not present

## 2020-07-25 DIAGNOSIS — N183 Chronic kidney disease, stage 3 unspecified: Secondary | ICD-10-CM | POA: Diagnosis not present

## 2020-07-25 DIAGNOSIS — I130 Hypertensive heart and chronic kidney disease with heart failure and stage 1 through stage 4 chronic kidney disease, or unspecified chronic kidney disease: Secondary | ICD-10-CM | POA: Diagnosis not present

## 2020-07-25 DIAGNOSIS — F0280 Dementia in other diseases classified elsewhere without behavioral disturbance: Secondary | ICD-10-CM | POA: Diagnosis not present

## 2020-07-25 DIAGNOSIS — I509 Heart failure, unspecified: Secondary | ICD-10-CM | POA: Diagnosis not present

## 2020-07-25 DIAGNOSIS — R296 Repeated falls: Secondary | ICD-10-CM | POA: Diagnosis not present

## 2020-07-25 DIAGNOSIS — G309 Alzheimer's disease, unspecified: Secondary | ICD-10-CM | POA: Diagnosis not present

## 2020-07-26 DIAGNOSIS — R296 Repeated falls: Secondary | ICD-10-CM | POA: Diagnosis not present

## 2020-07-26 DIAGNOSIS — G309 Alzheimer's disease, unspecified: Secondary | ICD-10-CM | POA: Diagnosis not present

## 2020-07-26 DIAGNOSIS — F0280 Dementia in other diseases classified elsewhere without behavioral disturbance: Secondary | ICD-10-CM | POA: Diagnosis not present

## 2020-07-26 DIAGNOSIS — I509 Heart failure, unspecified: Secondary | ICD-10-CM | POA: Diagnosis not present

## 2020-07-26 DIAGNOSIS — I130 Hypertensive heart and chronic kidney disease with heart failure and stage 1 through stage 4 chronic kidney disease, or unspecified chronic kidney disease: Secondary | ICD-10-CM | POA: Diagnosis not present

## 2020-07-26 DIAGNOSIS — N183 Chronic kidney disease, stage 3 unspecified: Secondary | ICD-10-CM | POA: Diagnosis not present

## 2020-07-29 DIAGNOSIS — R296 Repeated falls: Secondary | ICD-10-CM | POA: Diagnosis not present

## 2020-07-29 DIAGNOSIS — F0280 Dementia in other diseases classified elsewhere without behavioral disturbance: Secondary | ICD-10-CM | POA: Diagnosis not present

## 2020-07-29 DIAGNOSIS — G309 Alzheimer's disease, unspecified: Secondary | ICD-10-CM | POA: Diagnosis not present

## 2020-07-29 DIAGNOSIS — I509 Heart failure, unspecified: Secondary | ICD-10-CM | POA: Diagnosis not present

## 2020-07-29 DIAGNOSIS — I130 Hypertensive heart and chronic kidney disease with heart failure and stage 1 through stage 4 chronic kidney disease, or unspecified chronic kidney disease: Secondary | ICD-10-CM | POA: Diagnosis not present

## 2020-07-29 DIAGNOSIS — N183 Chronic kidney disease, stage 3 unspecified: Secondary | ICD-10-CM | POA: Diagnosis not present

## 2020-08-01 DIAGNOSIS — I509 Heart failure, unspecified: Secondary | ICD-10-CM | POA: Diagnosis not present

## 2020-08-01 DIAGNOSIS — I130 Hypertensive heart and chronic kidney disease with heart failure and stage 1 through stage 4 chronic kidney disease, or unspecified chronic kidney disease: Secondary | ICD-10-CM | POA: Diagnosis not present

## 2020-08-01 DIAGNOSIS — R296 Repeated falls: Secondary | ICD-10-CM | POA: Diagnosis not present

## 2020-08-01 DIAGNOSIS — F0280 Dementia in other diseases classified elsewhere without behavioral disturbance: Secondary | ICD-10-CM | POA: Diagnosis not present

## 2020-08-01 DIAGNOSIS — G309 Alzheimer's disease, unspecified: Secondary | ICD-10-CM | POA: Diagnosis not present

## 2020-08-01 DIAGNOSIS — N183 Chronic kidney disease, stage 3 unspecified: Secondary | ICD-10-CM | POA: Diagnosis not present

## 2020-08-05 DIAGNOSIS — N183 Chronic kidney disease, stage 3 unspecified: Secondary | ICD-10-CM | POA: Diagnosis not present

## 2020-08-05 DIAGNOSIS — I509 Heart failure, unspecified: Secondary | ICD-10-CM | POA: Diagnosis not present

## 2020-08-05 DIAGNOSIS — F0280 Dementia in other diseases classified elsewhere without behavioral disturbance: Secondary | ICD-10-CM | POA: Diagnosis not present

## 2020-08-05 DIAGNOSIS — R296 Repeated falls: Secondary | ICD-10-CM | POA: Diagnosis not present

## 2020-08-05 DIAGNOSIS — I130 Hypertensive heart and chronic kidney disease with heart failure and stage 1 through stage 4 chronic kidney disease, or unspecified chronic kidney disease: Secondary | ICD-10-CM | POA: Diagnosis not present

## 2020-08-05 DIAGNOSIS — G309 Alzheimer's disease, unspecified: Secondary | ICD-10-CM | POA: Diagnosis not present

## 2020-08-06 DIAGNOSIS — I509 Heart failure, unspecified: Secondary | ICD-10-CM | POA: Diagnosis not present

## 2020-08-06 DIAGNOSIS — F0280 Dementia in other diseases classified elsewhere without behavioral disturbance: Secondary | ICD-10-CM | POA: Diagnosis not present

## 2020-08-06 DIAGNOSIS — G309 Alzheimer's disease, unspecified: Secondary | ICD-10-CM | POA: Diagnosis not present

## 2020-08-06 DIAGNOSIS — N183 Chronic kidney disease, stage 3 unspecified: Secondary | ICD-10-CM | POA: Diagnosis not present

## 2020-08-06 DIAGNOSIS — I130 Hypertensive heart and chronic kidney disease with heart failure and stage 1 through stage 4 chronic kidney disease, or unspecified chronic kidney disease: Secondary | ICD-10-CM | POA: Diagnosis not present

## 2020-08-06 DIAGNOSIS — R296 Repeated falls: Secondary | ICD-10-CM | POA: Diagnosis not present

## 2020-08-07 DIAGNOSIS — F418 Other specified anxiety disorders: Secondary | ICD-10-CM | POA: Diagnosis not present

## 2020-08-07 DIAGNOSIS — H01003 Unspecified blepharitis right eye, unspecified eyelid: Secondary | ICD-10-CM | POA: Diagnosis not present

## 2020-08-07 DIAGNOSIS — Z993 Dependence on wheelchair: Secondary | ICD-10-CM | POA: Diagnosis not present

## 2020-08-07 DIAGNOSIS — R159 Full incontinence of feces: Secondary | ICD-10-CM | POA: Diagnosis not present

## 2020-08-07 DIAGNOSIS — Z87440 Personal history of urinary (tract) infections: Secondary | ICD-10-CM | POA: Diagnosis not present

## 2020-08-07 DIAGNOSIS — I4891 Unspecified atrial fibrillation: Secondary | ICD-10-CM | POA: Diagnosis not present

## 2020-08-07 DIAGNOSIS — I7300 Raynaud's syndrome without gangrene: Secondary | ICD-10-CM | POA: Diagnosis not present

## 2020-08-07 DIAGNOSIS — S22080D Wedge compression fracture of T11-T12 vertebra, subsequent encounter for fracture with routine healing: Secondary | ICD-10-CM | POA: Diagnosis not present

## 2020-08-07 DIAGNOSIS — Z741 Need for assistance with personal care: Secondary | ICD-10-CM | POA: Diagnosis not present

## 2020-08-07 DIAGNOSIS — I130 Hypertensive heart and chronic kidney disease with heart failure and stage 1 through stage 4 chronic kidney disease, or unspecified chronic kidney disease: Secondary | ICD-10-CM | POA: Diagnosis not present

## 2020-08-07 DIAGNOSIS — I509 Heart failure, unspecified: Secondary | ICD-10-CM | POA: Diagnosis not present

## 2020-08-07 DIAGNOSIS — R296 Repeated falls: Secondary | ICD-10-CM | POA: Diagnosis not present

## 2020-08-07 DIAGNOSIS — G250 Essential tremor: Secondary | ICD-10-CM | POA: Diagnosis not present

## 2020-08-07 DIAGNOSIS — E46 Unspecified protein-calorie malnutrition: Secondary | ICD-10-CM | POA: Diagnosis not present

## 2020-08-07 DIAGNOSIS — Z947 Corneal transplant status: Secondary | ICD-10-CM | POA: Diagnosis not present

## 2020-08-07 DIAGNOSIS — D631 Anemia in chronic kidney disease: Secondary | ICD-10-CM | POA: Diagnosis not present

## 2020-08-07 DIAGNOSIS — N183 Chronic kidney disease, stage 3 unspecified: Secondary | ICD-10-CM | POA: Diagnosis not present

## 2020-08-07 DIAGNOSIS — F0280 Dementia in other diseases classified elsewhere without behavioral disturbance: Secondary | ICD-10-CM | POA: Diagnosis not present

## 2020-08-07 DIAGNOSIS — G309 Alzheimer's disease, unspecified: Secondary | ICD-10-CM | POA: Diagnosis not present

## 2020-08-07 DIAGNOSIS — R32 Unspecified urinary incontinence: Secondary | ICD-10-CM | POA: Diagnosis not present

## 2020-08-07 DIAGNOSIS — Z681 Body mass index (BMI) 19 or less, adult: Secondary | ICD-10-CM | POA: Diagnosis not present

## 2020-08-08 DIAGNOSIS — F0280 Dementia in other diseases classified elsewhere without behavioral disturbance: Secondary | ICD-10-CM | POA: Diagnosis not present

## 2020-08-08 DIAGNOSIS — R296 Repeated falls: Secondary | ICD-10-CM | POA: Diagnosis not present

## 2020-08-08 DIAGNOSIS — I509 Heart failure, unspecified: Secondary | ICD-10-CM | POA: Diagnosis not present

## 2020-08-08 DIAGNOSIS — G309 Alzheimer's disease, unspecified: Secondary | ICD-10-CM | POA: Diagnosis not present

## 2020-08-08 DIAGNOSIS — I130 Hypertensive heart and chronic kidney disease with heart failure and stage 1 through stage 4 chronic kidney disease, or unspecified chronic kidney disease: Secondary | ICD-10-CM | POA: Diagnosis not present

## 2020-08-08 DIAGNOSIS — N183 Chronic kidney disease, stage 3 unspecified: Secondary | ICD-10-CM | POA: Diagnosis not present

## 2020-08-10 DIAGNOSIS — G309 Alzheimer's disease, unspecified: Secondary | ICD-10-CM | POA: Diagnosis not present

## 2020-08-10 DIAGNOSIS — N183 Chronic kidney disease, stage 3 unspecified: Secondary | ICD-10-CM | POA: Diagnosis not present

## 2020-08-10 DIAGNOSIS — R296 Repeated falls: Secondary | ICD-10-CM | POA: Diagnosis not present

## 2020-08-10 DIAGNOSIS — F0280 Dementia in other diseases classified elsewhere without behavioral disturbance: Secondary | ICD-10-CM | POA: Diagnosis not present

## 2020-08-10 DIAGNOSIS — I509 Heart failure, unspecified: Secondary | ICD-10-CM | POA: Diagnosis not present

## 2020-08-10 DIAGNOSIS — I130 Hypertensive heart and chronic kidney disease with heart failure and stage 1 through stage 4 chronic kidney disease, or unspecified chronic kidney disease: Secondary | ICD-10-CM | POA: Diagnosis not present

## 2020-08-11 ENCOUNTER — Encounter: Payer: Self-pay | Admitting: Nurse Practitioner

## 2020-08-11 ENCOUNTER — Non-Acute Institutional Stay (SKILLED_NURSING_FACILITY): Payer: Medicare Other | Admitting: Nurse Practitioner

## 2020-08-11 DIAGNOSIS — R296 Repeated falls: Secondary | ICD-10-CM | POA: Diagnosis not present

## 2020-08-11 DIAGNOSIS — F339 Major depressive disorder, recurrent, unspecified: Secondary | ICD-10-CM | POA: Diagnosis not present

## 2020-08-11 DIAGNOSIS — G250 Essential tremor: Secondary | ICD-10-CM | POA: Diagnosis not present

## 2020-08-11 DIAGNOSIS — I509 Heart failure, unspecified: Secondary | ICD-10-CM | POA: Diagnosis not present

## 2020-08-11 DIAGNOSIS — K5901 Slow transit constipation: Secondary | ICD-10-CM

## 2020-08-11 DIAGNOSIS — N183 Chronic kidney disease, stage 3 unspecified: Secondary | ICD-10-CM | POA: Diagnosis not present

## 2020-08-11 DIAGNOSIS — I130 Hypertensive heart and chronic kidney disease with heart failure and stage 1 through stage 4 chronic kidney disease, or unspecified chronic kidney disease: Secondary | ICD-10-CM | POA: Diagnosis not present

## 2020-08-11 DIAGNOSIS — R269 Unspecified abnormalities of gait and mobility: Secondary | ICD-10-CM | POA: Diagnosis not present

## 2020-08-11 DIAGNOSIS — M545 Low back pain, unspecified: Secondary | ICD-10-CM

## 2020-08-11 DIAGNOSIS — G309 Alzheimer's disease, unspecified: Secondary | ICD-10-CM | POA: Diagnosis not present

## 2020-08-11 DIAGNOSIS — F0280 Dementia in other diseases classified elsewhere without behavioral disturbance: Secondary | ICD-10-CM | POA: Diagnosis not present

## 2020-08-11 NOTE — Progress Notes (Signed)
Location:   [facility] Room Number: 31 Place of Service:  SNF (31) Provider:  Zymire Turnbo, Arlean Hopping, NP  Mahlon Gammon, MD  Patient Care Team: Mahlon Gammon, MD as PCP - General (Internal Medicine) Nahser, Deloris Ping, MD as PCP - Cardiology (Cardiology) Esperansa Sarabia X, NP as Nurse Practitioner (Internal Medicine)  Extended Emergency Contact Information Primary Emergency Contact: Morgan,Tom Address: [address]          [place], [place] [address] [place] of [place] Home Phone: [phone] Relation: Friend Secondary Emergency Contact: Brandy Hale [place] of [place] Home Phone: [phone] Relation: Niece  Code Status:  DNR Goals of care: Advanced Directive information Advanced Directives 08/11/2020  Does Patient Have a Medical Advance Directive? Yes  Type of Advance Directive Healthcare Power of Attorney  Does patient want to make changes to medical advance directive? No - Patient declined  Copy of Healthcare Power of Attorney in Chart? Yes - validated most recent copy scanned in chart (See row information)  Would patient like information on creating a medical advance directive? -  Pre-existing out of facility DNR order (yellow form or pink MOST form) Yellow form placed in chart (order not valid for inpatient use)     Chief Complaint  Patient presents with  . Medical Management of Chronic Issues    Routine follow up visit.    HPI:  Pt is a 87 y.o. female seen today for medical management of chronic diseases.    Advanced dementia, no safety awareness Worsened gait, frailty, wobbly gait, frequent falls Essential tremor,dependent of ADLs, offPropranolol Anxiety/depression, able to redirected with activities, prn Lorazepam.  Chronic lower back pain, takes Tylenol 1000mg  bid Constipation, takes Senokot SII qhs.    Past Medical History:  Diagnosis Date  . Anticoagulant  long-term use   . Atrial fibrillation (HCC)   . CHF (congestive heart failure) (HCC)   . Chronic anticoagulation   . Hypertension   . Raynaud phenomenon   . Tremor, essential    Past Surgical History:  Procedure Laterality Date  . CHOLECYSTECTOMY    . TONSILLECTOMY      Allergies  Allergen Reactions  . Fosamax [Alendronate]   . Other Nausea And Vomiting    Green pepper    Allergies as of 08/11/2020      Reactions   Fosamax [alendronate]    Other Nausea And Vomiting   Green pepper      Medication List       Accurate as of August 11, 2020 11:59 PM. If you have any questions, ask your nurse or doctor.        STOP taking these medications   LORazepam 0.5 MG tablet Commonly known as: ATIVAN Stopped by: Orry Sigl X Aneliese Beaudry, NP     TAKE these medications   acetaminophen 500 MG tablet Commonly known as: TYLENOL Take 500 mg by mouth every 8 (eight) hours as needed for mild pain or moderate pain.   acetaminophen 500 MG tablet Commonly known as: TYLENOL Take 1,000 mg by mouth 2 (two) times daily. Not to exceed 3,00 mg daily.   Caltrate 600+D Plus Minerals 600-800 MG-UNIT Chew Chew 1 tablet by mouth 2 (two) times a day.   magnesium hydroxide 400 MG/5ML suspension Commonly known as: MILK OF MAGNESIA Take by mouth every other day.   NON FORMULARY Regular, Nectar Thick, Mech Soft Special Instructions: Regular diet with Nectar thick liquids by straw. 2 handled cup and divided plate with built-up utensils  for meals.   sennosides-docusate sodium 8.6-50 MG tablet Commonly known as: SENOKOT-S Take 2 tablets by mouth at bedtime.       Review of Systems  Constitutional: Negative for fatigue, fever and unexpected weight change.       #5Ibs weight loss in the past month.   HENT: Positive for hearing loss. Negative for congestion and voice change.   Eyes: Negative for visual disturbance.  Respiratory: Negative for shortness of breath.   Cardiovascular: Negative for leg swelling.   Gastrointestinal: Negative for abdominal pain and constipation.  Genitourinary: Negative for difficulty urinating, dysuria and urgency.  Musculoskeletal: Positive for arthralgias, back pain and gait problem.  Skin: Negative for color change.  Neurological: Positive for tremors. Negative for speech difficulty and headaches.       Dementia  Psychiatric/Behavioral: Positive for agitation, behavioral problems, confusion and sleep disturbance. The patient is nervous/anxious.     Immunization History  Administered Date(s) Administered  . Influenza Whole 05/09/2018  . Influenza, High Dose Seasonal PF 05/09/2019  . Influenza-Unspecified 05/28/2017, 05/19/2020  . Moderna Sars-Covid-2 Vaccination 08/09/2019, 09/06/2019, 06/15/2020  . Pneumococcal Conjugate-13 07/13/2017  . Pneumococcal Polysaccharide-23 04/07/2002  . Tdap 07/13/2017, 10/08/2017   Pertinent  Health Maintenance Due  Topic Date Due  . INFLUENZA VACCINE  Completed  . DEXA SCAN  Completed  . PNA vac Low Risk Adult  Completed   Fall Risk  03/10/2019 10/05/2017  Falls in the past year? (No Data) No  Comment Emmi Telephone Survey: data to providers prior to load -  Number falls in past yr: (No Data) -  Comment Emmi Telephone Survey Actual Response =  -   Functional Status Survey:    Vitals:   08/11/20 1013  BP: 130/64  Pulse: 72  Resp: 20  Temp: (!) 97.5 F (36.4 C)  SpO2: 93%  Weight: 100 lb 11.2 oz (45.7 kg)  Height: 5\' 4"  (1.626 m)   Body mass index is 17.29 kg/m. Physical Exam Vitals and nursing note reviewed.  Constitutional:      Appearance: Normal appearance.     Comments: frail  HENT:     Head: Normocephalic and atraumatic.     Mouth/Throat:     Mouth: Mucous membranes are moist.  Eyes:     Extraocular Movements: Extraocular movements intact.     Conjunctiva/sclera: Conjunctivae normal.     Pupils: Pupils are equal, round, and reactive to light.  Cardiovascular:     Rate and Rhythm: Normal rate.  Rhythm irregular.     Heart sounds: No murmur heard.   Pulmonary:     Breath sounds: No rales.  Abdominal:     General: Bowel sounds are normal.     Palpations: Abdomen is soft.     Tenderness: There is no abdominal tenderness.  Musculoskeletal:     Cervical back: Normal range of motion and neck supple.     Right lower leg: No edema.     Left lower leg: No edema.  Skin:    General: Skin is warm and dry.  Neurological:     General: No focal deficit present.     Mental Status: She is alert. Mental status is at baseline.     Coordination: Coordination abnormal.     Gait: Gait abnormal.     Comments: Tremor in fingers are worsened. Oriented to self.   Psychiatric:     Comments: Confused.      Labs reviewed: Recent Labs    10/28/19 0000 01/29/20 0000  NA 144 145  K 4.9 4.3  CL 105 103  CO2 33* 34*  BUN 29* 41*  CREATININE 1.1 1.2*  CALCIUM 9.9 9.7   Recent Labs    10/28/19 0000 01/29/20 0000  AST 15 12*  ALT 6* 7  ALKPHOS 43 44  ALBUMIN 3.5 3.6   Recent Labs    10/28/19 0000 01/29/20 0000  WBC 8.1 11.9  NEUTROABS  --  8,342  HGB 11.4* 12.1  HCT 34* 36  PLT 186 144*   Lab Results  Component Value Date   TSH 0.76 05/21/2019   No results found for: HGBA1C No results found for: CHOL, HDL, LDLCALC, LDLDIRECT, TRIG, CHOLHDL  Significant Diagnostic Results in last 30 days:  No results found.  Assessment/Plan Gait abnormality Worsened gait, frailty, wobbly gait, frequent falls  Tremor, essential Essential tremor,dependent of ADLs, offPropranolol   Depression, recurrent (HCC) Anxiety/depression, able to redirected with activities, prn Lorazepam.    Lower back pain Chronic lower back pain, takes Tylenol 1000mg  bid   Slow transit constipation Constipation, takes Senokot SII qhs.    Family/ staff Communication: plan of care reviewed with the patient and charge nurse.   Labs/tests ordered:  none  Time spend 25 minutes.

## 2020-08-11 NOTE — Assessment & Plan Note (Signed)
Constipation, takes Senokot SII qhs.

## 2020-08-11 NOTE — Assessment & Plan Note (Signed)
Anxiety/depression, able to redirected with activities, prn Lorazepam. 

## 2020-08-11 NOTE — Assessment & Plan Note (Signed)
Worsened gait, frailty, wobbly gait, frequent falls 

## 2020-08-11 NOTE — Assessment & Plan Note (Signed)
Chronic lower back pain, takes Tylenol 1000mg  bid

## 2020-08-11 NOTE — Assessment & Plan Note (Signed)
Essential tremor,dependent of ADLs, offPropranolol  

## 2020-08-12 ENCOUNTER — Encounter: Payer: Self-pay | Admitting: Nurse Practitioner

## 2020-08-12 DIAGNOSIS — G309 Alzheimer's disease, unspecified: Secondary | ICD-10-CM | POA: Diagnosis not present

## 2020-08-12 DIAGNOSIS — N183 Chronic kidney disease, stage 3 unspecified: Secondary | ICD-10-CM | POA: Diagnosis not present

## 2020-08-12 DIAGNOSIS — I509 Heart failure, unspecified: Secondary | ICD-10-CM | POA: Diagnosis not present

## 2020-08-12 DIAGNOSIS — R296 Repeated falls: Secondary | ICD-10-CM | POA: Diagnosis not present

## 2020-08-12 DIAGNOSIS — F0280 Dementia in other diseases classified elsewhere without behavioral disturbance: Secondary | ICD-10-CM | POA: Diagnosis not present

## 2020-08-12 DIAGNOSIS — I130 Hypertensive heart and chronic kidney disease with heart failure and stage 1 through stage 4 chronic kidney disease, or unspecified chronic kidney disease: Secondary | ICD-10-CM | POA: Diagnosis not present

## 2020-08-15 DIAGNOSIS — R296 Repeated falls: Secondary | ICD-10-CM | POA: Diagnosis not present

## 2020-08-15 DIAGNOSIS — I130 Hypertensive heart and chronic kidney disease with heart failure and stage 1 through stage 4 chronic kidney disease, or unspecified chronic kidney disease: Secondary | ICD-10-CM | POA: Diagnosis not present

## 2020-08-15 DIAGNOSIS — F0280 Dementia in other diseases classified elsewhere without behavioral disturbance: Secondary | ICD-10-CM | POA: Diagnosis not present

## 2020-08-15 DIAGNOSIS — G309 Alzheimer's disease, unspecified: Secondary | ICD-10-CM | POA: Diagnosis not present

## 2020-08-15 DIAGNOSIS — I509 Heart failure, unspecified: Secondary | ICD-10-CM | POA: Diagnosis not present

## 2020-08-15 DIAGNOSIS — N183 Chronic kidney disease, stage 3 unspecified: Secondary | ICD-10-CM | POA: Diagnosis not present

## 2020-08-19 DIAGNOSIS — N183 Chronic kidney disease, stage 3 unspecified: Secondary | ICD-10-CM | POA: Diagnosis not present

## 2020-08-19 DIAGNOSIS — R296 Repeated falls: Secondary | ICD-10-CM | POA: Diagnosis not present

## 2020-08-19 DIAGNOSIS — I509 Heart failure, unspecified: Secondary | ICD-10-CM | POA: Diagnosis not present

## 2020-08-19 DIAGNOSIS — G309 Alzheimer's disease, unspecified: Secondary | ICD-10-CM | POA: Diagnosis not present

## 2020-08-19 DIAGNOSIS — F0280 Dementia in other diseases classified elsewhere without behavioral disturbance: Secondary | ICD-10-CM | POA: Diagnosis not present

## 2020-08-19 DIAGNOSIS — I130 Hypertensive heart and chronic kidney disease with heart failure and stage 1 through stage 4 chronic kidney disease, or unspecified chronic kidney disease: Secondary | ICD-10-CM | POA: Diagnosis not present

## 2020-08-23 ENCOUNTER — Other Ambulatory Visit: Payer: Self-pay | Admitting: *Deleted

## 2020-08-23 MED ORDER — LORAZEPAM 0.5 MG PO TABS: 0.5000 mg | ORAL_TABLET | Freq: Every day | ORAL | 0 refills | Status: DC | PRN

## 2020-08-23 NOTE — Telephone Encounter (Signed)
Received refill Request from [facility] Pended Rx and sent to Dr. Chales Abrahams for approval.    OV Note Dated 08/11/2020: Depression, recurrent (HCC) Anxiety/depression, able to redirected with activities, prn Lorazepam.

## 2020-08-25 DIAGNOSIS — I130 Hypertensive heart and chronic kidney disease with heart failure and stage 1 through stage 4 chronic kidney disease, or unspecified chronic kidney disease: Secondary | ICD-10-CM | POA: Diagnosis not present

## 2020-08-25 DIAGNOSIS — G309 Alzheimer's disease, unspecified: Secondary | ICD-10-CM | POA: Diagnosis not present

## 2020-08-25 DIAGNOSIS — F0280 Dementia in other diseases classified elsewhere without behavioral disturbance: Secondary | ICD-10-CM | POA: Diagnosis not present

## 2020-08-25 DIAGNOSIS — R296 Repeated falls: Secondary | ICD-10-CM | POA: Diagnosis not present

## 2020-08-25 DIAGNOSIS — N183 Chronic kidney disease, stage 3 unspecified: Secondary | ICD-10-CM | POA: Diagnosis not present

## 2020-08-25 DIAGNOSIS — I509 Heart failure, unspecified: Secondary | ICD-10-CM | POA: Diagnosis not present

## 2020-08-26 DIAGNOSIS — R296 Repeated falls: Secondary | ICD-10-CM | POA: Diagnosis not present

## 2020-08-26 DIAGNOSIS — N183 Chronic kidney disease, stage 3 unspecified: Secondary | ICD-10-CM | POA: Diagnosis not present

## 2020-08-26 DIAGNOSIS — I509 Heart failure, unspecified: Secondary | ICD-10-CM | POA: Diagnosis not present

## 2020-08-26 DIAGNOSIS — F0280 Dementia in other diseases classified elsewhere without behavioral disturbance: Secondary | ICD-10-CM | POA: Diagnosis not present

## 2020-08-26 DIAGNOSIS — I130 Hypertensive heart and chronic kidney disease with heart failure and stage 1 through stage 4 chronic kidney disease, or unspecified chronic kidney disease: Secondary | ICD-10-CM | POA: Diagnosis not present

## 2020-08-26 DIAGNOSIS — G309 Alzheimer's disease, unspecified: Secondary | ICD-10-CM | POA: Diagnosis not present

## 2020-08-29 DIAGNOSIS — G309 Alzheimer's disease, unspecified: Secondary | ICD-10-CM | POA: Diagnosis not present

## 2020-08-29 DIAGNOSIS — F0280 Dementia in other diseases classified elsewhere without behavioral disturbance: Secondary | ICD-10-CM | POA: Diagnosis not present

## 2020-08-29 DIAGNOSIS — I130 Hypertensive heart and chronic kidney disease with heart failure and stage 1 through stage 4 chronic kidney disease, or unspecified chronic kidney disease: Secondary | ICD-10-CM | POA: Diagnosis not present

## 2020-08-29 DIAGNOSIS — I509 Heart failure, unspecified: Secondary | ICD-10-CM | POA: Diagnosis not present

## 2020-08-29 DIAGNOSIS — R296 Repeated falls: Secondary | ICD-10-CM | POA: Diagnosis not present

## 2020-08-29 DIAGNOSIS — N183 Chronic kidney disease, stage 3 unspecified: Secondary | ICD-10-CM | POA: Diagnosis not present

## 2020-09-01 DIAGNOSIS — I509 Heart failure, unspecified: Secondary | ICD-10-CM | POA: Diagnosis not present

## 2020-09-01 DIAGNOSIS — R296 Repeated falls: Secondary | ICD-10-CM | POA: Diagnosis not present

## 2020-09-01 DIAGNOSIS — F0280 Dementia in other diseases classified elsewhere without behavioral disturbance: Secondary | ICD-10-CM | POA: Diagnosis not present

## 2020-09-01 DIAGNOSIS — I130 Hypertensive heart and chronic kidney disease with heart failure and stage 1 through stage 4 chronic kidney disease, or unspecified chronic kidney disease: Secondary | ICD-10-CM | POA: Diagnosis not present

## 2020-09-01 DIAGNOSIS — N183 Chronic kidney disease, stage 3 unspecified: Secondary | ICD-10-CM | POA: Diagnosis not present

## 2020-09-01 DIAGNOSIS — G309 Alzheimer's disease, unspecified: Secondary | ICD-10-CM | POA: Diagnosis not present

## 2020-09-02 DIAGNOSIS — N183 Chronic kidney disease, stage 3 unspecified: Secondary | ICD-10-CM | POA: Diagnosis not present

## 2020-09-02 DIAGNOSIS — F0280 Dementia in other diseases classified elsewhere without behavioral disturbance: Secondary | ICD-10-CM | POA: Diagnosis not present

## 2020-09-02 DIAGNOSIS — G309 Alzheimer's disease, unspecified: Secondary | ICD-10-CM | POA: Diagnosis not present

## 2020-09-02 DIAGNOSIS — R296 Repeated falls: Secondary | ICD-10-CM | POA: Diagnosis not present

## 2020-09-02 DIAGNOSIS — I130 Hypertensive heart and chronic kidney disease with heart failure and stage 1 through stage 4 chronic kidney disease, or unspecified chronic kidney disease: Secondary | ICD-10-CM | POA: Diagnosis not present

## 2020-09-02 DIAGNOSIS — I509 Heart failure, unspecified: Secondary | ICD-10-CM | POA: Diagnosis not present

## 2020-09-03 DIAGNOSIS — I130 Hypertensive heart and chronic kidney disease with heart failure and stage 1 through stage 4 chronic kidney disease, or unspecified chronic kidney disease: Secondary | ICD-10-CM | POA: Diagnosis not present

## 2020-09-03 DIAGNOSIS — I509 Heart failure, unspecified: Secondary | ICD-10-CM | POA: Diagnosis not present

## 2020-09-03 DIAGNOSIS — N183 Chronic kidney disease, stage 3 unspecified: Secondary | ICD-10-CM | POA: Diagnosis not present

## 2020-09-03 DIAGNOSIS — G309 Alzheimer's disease, unspecified: Secondary | ICD-10-CM | POA: Diagnosis not present

## 2020-09-03 DIAGNOSIS — R296 Repeated falls: Secondary | ICD-10-CM | POA: Diagnosis not present

## 2020-09-03 DIAGNOSIS — F0280 Dementia in other diseases classified elsewhere without behavioral disturbance: Secondary | ICD-10-CM | POA: Diagnosis not present

## 2020-09-05 DIAGNOSIS — F0280 Dementia in other diseases classified elsewhere without behavioral disturbance: Secondary | ICD-10-CM | POA: Diagnosis not present

## 2020-09-05 DIAGNOSIS — I509 Heart failure, unspecified: Secondary | ICD-10-CM | POA: Diagnosis not present

## 2020-09-05 DIAGNOSIS — G309 Alzheimer's disease, unspecified: Secondary | ICD-10-CM | POA: Diagnosis not present

## 2020-09-05 DIAGNOSIS — I130 Hypertensive heart and chronic kidney disease with heart failure and stage 1 through stage 4 chronic kidney disease, or unspecified chronic kidney disease: Secondary | ICD-10-CM | POA: Diagnosis not present

## 2020-09-05 DIAGNOSIS — R296 Repeated falls: Secondary | ICD-10-CM | POA: Diagnosis not present

## 2020-09-05 DIAGNOSIS — N183 Chronic kidney disease, stage 3 unspecified: Secondary | ICD-10-CM | POA: Diagnosis not present

## 2020-09-07 DIAGNOSIS — N183 Chronic kidney disease, stage 3 unspecified: Secondary | ICD-10-CM | POA: Diagnosis not present

## 2020-09-07 DIAGNOSIS — Z681 Body mass index (BMI) 19 or less, adult: Secondary | ICD-10-CM | POA: Diagnosis not present

## 2020-09-07 DIAGNOSIS — I4891 Unspecified atrial fibrillation: Secondary | ICD-10-CM | POA: Diagnosis not present

## 2020-09-07 DIAGNOSIS — Z87440 Personal history of urinary (tract) infections: Secondary | ICD-10-CM | POA: Diagnosis not present

## 2020-09-07 DIAGNOSIS — G309 Alzheimer's disease, unspecified: Secondary | ICD-10-CM | POA: Diagnosis not present

## 2020-09-07 DIAGNOSIS — E46 Unspecified protein-calorie malnutrition: Secondary | ICD-10-CM | POA: Diagnosis not present

## 2020-09-07 DIAGNOSIS — F0280 Dementia in other diseases classified elsewhere without behavioral disturbance: Secondary | ICD-10-CM | POA: Diagnosis not present

## 2020-09-07 DIAGNOSIS — Z741 Need for assistance with personal care: Secondary | ICD-10-CM | POA: Diagnosis not present

## 2020-09-07 DIAGNOSIS — R32 Unspecified urinary incontinence: Secondary | ICD-10-CM | POA: Diagnosis not present

## 2020-09-07 DIAGNOSIS — I130 Hypertensive heart and chronic kidney disease with heart failure and stage 1 through stage 4 chronic kidney disease, or unspecified chronic kidney disease: Secondary | ICD-10-CM | POA: Diagnosis not present

## 2020-09-07 DIAGNOSIS — H01003 Unspecified blepharitis right eye, unspecified eyelid: Secondary | ICD-10-CM | POA: Diagnosis not present

## 2020-09-07 DIAGNOSIS — S22080D Wedge compression fracture of T11-T12 vertebra, subsequent encounter for fracture with routine healing: Secondary | ICD-10-CM | POA: Diagnosis not present

## 2020-09-07 DIAGNOSIS — Z947 Corneal transplant status: Secondary | ICD-10-CM | POA: Diagnosis not present

## 2020-09-07 DIAGNOSIS — D631 Anemia in chronic kidney disease: Secondary | ICD-10-CM | POA: Diagnosis not present

## 2020-09-07 DIAGNOSIS — I509 Heart failure, unspecified: Secondary | ICD-10-CM | POA: Diagnosis not present

## 2020-09-07 DIAGNOSIS — R296 Repeated falls: Secondary | ICD-10-CM | POA: Diagnosis not present

## 2020-09-07 DIAGNOSIS — I7300 Raynaud's syndrome without gangrene: Secondary | ICD-10-CM | POA: Diagnosis not present

## 2020-09-07 DIAGNOSIS — Z993 Dependence on wheelchair: Secondary | ICD-10-CM | POA: Diagnosis not present

## 2020-09-07 DIAGNOSIS — G250 Essential tremor: Secondary | ICD-10-CM | POA: Diagnosis not present

## 2020-09-07 DIAGNOSIS — R159 Full incontinence of feces: Secondary | ICD-10-CM | POA: Diagnosis not present

## 2020-09-07 DIAGNOSIS — F418 Other specified anxiety disorders: Secondary | ICD-10-CM | POA: Diagnosis not present

## 2020-09-08 DIAGNOSIS — I509 Heart failure, unspecified: Secondary | ICD-10-CM | POA: Diagnosis not present

## 2020-09-08 DIAGNOSIS — N183 Chronic kidney disease, stage 3 unspecified: Secondary | ICD-10-CM | POA: Diagnosis not present

## 2020-09-08 DIAGNOSIS — R296 Repeated falls: Secondary | ICD-10-CM | POA: Diagnosis not present

## 2020-09-08 DIAGNOSIS — F0280 Dementia in other diseases classified elsewhere without behavioral disturbance: Secondary | ICD-10-CM | POA: Diagnosis not present

## 2020-09-08 DIAGNOSIS — G309 Alzheimer's disease, unspecified: Secondary | ICD-10-CM | POA: Diagnosis not present

## 2020-09-08 DIAGNOSIS — I130 Hypertensive heart and chronic kidney disease with heart failure and stage 1 through stage 4 chronic kidney disease, or unspecified chronic kidney disease: Secondary | ICD-10-CM | POA: Diagnosis not present

## 2020-09-09 ENCOUNTER — Encounter: Payer: Self-pay | Admitting: Nurse Practitioner

## 2020-09-09 ENCOUNTER — Non-Acute Institutional Stay (SKILLED_NURSING_FACILITY): Payer: Medicare Other | Admitting: Nurse Practitioner

## 2020-09-09 DIAGNOSIS — G250 Essential tremor: Secondary | ICD-10-CM

## 2020-09-09 DIAGNOSIS — F0280 Dementia in other diseases classified elsewhere without behavioral disturbance: Secondary | ICD-10-CM

## 2020-09-09 DIAGNOSIS — I130 Hypertensive heart and chronic kidney disease with heart failure and stage 1 through stage 4 chronic kidney disease, or unspecified chronic kidney disease: Secondary | ICD-10-CM | POA: Diagnosis not present

## 2020-09-09 DIAGNOSIS — F339 Major depressive disorder, recurrent, unspecified: Secondary | ICD-10-CM

## 2020-09-09 DIAGNOSIS — R296 Repeated falls: Secondary | ICD-10-CM | POA: Diagnosis not present

## 2020-09-09 DIAGNOSIS — I509 Heart failure, unspecified: Secondary | ICD-10-CM | POA: Diagnosis not present

## 2020-09-09 DIAGNOSIS — R269 Unspecified abnormalities of gait and mobility: Secondary | ICD-10-CM

## 2020-09-09 DIAGNOSIS — M545 Low back pain, unspecified: Secondary | ICD-10-CM

## 2020-09-09 DIAGNOSIS — N183 Chronic kidney disease, stage 3 unspecified: Secondary | ICD-10-CM | POA: Diagnosis not present

## 2020-09-09 DIAGNOSIS — G309 Alzheimer's disease, unspecified: Secondary | ICD-10-CM | POA: Diagnosis not present

## 2020-09-09 DIAGNOSIS — K5901 Slow transit constipation: Secondary | ICD-10-CM

## 2020-09-09 DIAGNOSIS — F0150 Vascular dementia without behavioral disturbance: Secondary | ICD-10-CM | POA: Diagnosis not present

## 2020-09-09 NOTE — Assessment & Plan Note (Signed)
Worsened gait, frailty, wobbly gait, frequent falls

## 2020-09-09 NOTE — Assessment & Plan Note (Signed)
Chronic lower back pain, takes Tylenol 1000mg  bid/500mg  q8hrs prn.

## 2020-09-09 NOTE — Progress Notes (Signed)
Location:   [facility] Room Number: 31 Place of Service:  SNF (31) Provider:  Sydell Axon, Arna Snipe NP  Mahlon Gammon, MD  Patient Care Team: Mahlon Gammon, MD as PCP - General (Internal Medicine) Nahser, Deloris Ping, MD as PCP - Cardiology (Cardiology) Aariana Shankland X, NP as Nurse Practitioner (Internal Medicine)  Extended Emergency Contact Information Primary Emergency Contact: Morgan,Tom Address: [address]          [place], [place] [address] [place] of [place] Home Phone: [phone] Relation: Friend Secondary Emergency Contact: Brandy Hale [place] Home Phone: [phone] Relation: Niece  Code Status:  DNR Hospice Goals of care: Advanced Directive information Advanced Directives 08/11/2020  Does Patient Have a Medical Advance Directive? Yes  Type of Advance Directive Healthcare Power of Attorney  Does patient want to make changes to medical advance directive? No - Patient declined  Copy of Healthcare Power of Attorney in Chart? Yes - validated most recent copy scanned in chart (See row information)  Would patient like information on creating a medical advance directive? -  Pre-existing out of facility DNR order (yellow form or pink MOST form) Yellow form placed in chart (order not valid for inpatient use)     Chief Complaint  Patient presents with  . Medical Management of Chronic Issues    HPI:  Pt is a 87 y.o. female seen today for medical management of chronic diseases.      Advanced dementia, no safety awareness Worsened gait, frailty, wobbly gait, frequent falls Essential tremor,dependent of ADLs, offPropranolol Anxiety/depression, able to redirected with activities, prn Lorazepam. Chronic lower back pain, takes Tylenol 1000mg  bid/500mg  q8hrs prn.  Constipation, takes Senokot SII qhs, MOM qod.    Past Medical History:  Diagnosis Date  .  Anticoagulant long-term use   . Atrial fibrillation (HCC)   . CHF (congestive heart failure) (HCC)   . Chronic anticoagulation   . Hypertension   . Raynaud phenomenon   . Tremor, essential    Past Surgical History:  Procedure Laterality Date  . CHOLECYSTECTOMY    . TONSILLECTOMY      Allergies  Allergen Reactions  . Fosamax [Alendronate]   . Other Nausea And Vomiting    Green pepper    Allergies as of 09/09/2020      Reactions   Fosamax [alendronate]    Other Nausea And Vomiting   Green pepper      Medication List       Accurate as of September 09, 2020  2:38 PM. If you have any questions, ask your nurse or doctor.        acetaminophen 500 MG tablet Commonly known as: TYLENOL Take 500 mg by mouth every 8 (eight) hours as needed for mild pain or moderate pain.   acetaminophen 500 MG tablet Commonly known as: TYLENOL Take 1,000 mg by mouth 2 (two) times daily. Not to exceed 3,00 mg daily.   Caltrate 600+D Plus Minerals 600-800 MG-UNIT Chew Chew 1 tablet by mouth 2 (two) times a day.   LORazepam 0.5 MG tablet Commonly known as: ATIVAN Take 1 tablet (0.5 mg total) by mouth daily as needed for anxiety.   magnesium hydroxide 400 MG/5ML suspension Commonly known as: MILK OF MAGNESIA Take by mouth every other day.   NON FORMULARY Regular, Nectar Thick, Mech Soft Special Instructions: Regular diet with Nectar thick liquids by straw. 2 handled cup and divided plate with built-up utensils for meals.   sennosides-docusate  sodium 8.6-50 MG tablet Commonly known as: SENOKOT-S Take 2 tablets by mouth at bedtime.       Review of Systems  Constitutional: Negative for fatigue, fever and unexpected weight change.       #5Ibs weight loss in the past month.   HENT: Positive for hearing loss. Negative for congestion and voice change.   Eyes: Negative for visual disturbance.  Respiratory: Negative for shortness of breath.   Cardiovascular: Negative for leg swelling.   Gastrointestinal: Negative for abdominal pain and constipation.  Genitourinary: Negative for difficulty urinating, dysuria and urgency.  Musculoskeletal: Positive for arthralgias, back pain and gait problem.  Skin: Negative for color change.  Neurological: Positive for tremors. Negative for speech difficulty and headaches.       Dementia  Psychiatric/Behavioral: Positive for agitation, behavioral problems, confusion and sleep disturbance. The patient is nervous/anxious.     Immunization History  Administered Date(s) Administered  . Influenza Whole 05/09/2018  . Influenza, High Dose Seasonal PF 05/09/2019  . Influenza-Unspecified 05/28/2017, 05/19/2020  . Moderna Sars-Covid-2 Vaccination 08/09/2019, 09/06/2019, 06/15/2020  . Pneumococcal Conjugate-13 07/13/2017  . Pneumococcal Polysaccharide-23 04/07/2002  . Tdap 07/13/2017, 10/08/2017   Pertinent  Health Maintenance Due  Topic Date Due  . INFLUENZA VACCINE  Completed  . DEXA SCAN  Completed  . PNA vac Low Risk Adult  Completed   Fall Risk  03/10/2019 10/05/2017  Falls in the past year? (No Data) No  Comment Emmi Telephone Survey: data to providers prior to load -  Number falls in past yr: (No Data) -  Comment Emmi Telephone Survey Actual Response =  -   Functional Status Survey:    Vitals:   09/09/20 1006  BP: (!) 148/78  Pulse: 80  Resp: 18  Temp: 98.4 F (36.9 C)  SpO2: 98%  Weight: 101 lb 12.8 oz (46.2 kg)  Height: 5\' 4"  (1.626 m)   Body mass index is 17.47 kg/m. Physical Exam Vitals and nursing note reviewed.  Constitutional:      Appearance: Normal appearance.     Comments: frail  HENT:     Head: Normocephalic and atraumatic.     Mouth/Throat:     Mouth: Mucous membranes are moist.  Eyes:     Extraocular Movements: Extraocular movements intact.     Conjunctiva/sclera: Conjunctivae normal.     Pupils: Pupils are equal, round, and reactive to light.  Cardiovascular:     Rate and Rhythm: Normal rate.  Rhythm irregular.     Heart sounds: No murmur heard.   Pulmonary:     Breath sounds: No rales.  Abdominal:     General: Bowel sounds are normal.     Palpations: Abdomen is soft.     Tenderness: There is no abdominal tenderness.  Musculoskeletal:     Cervical back: Normal range of motion and neck supple.     Right lower leg: No edema.     Left lower leg: No edema.  Skin:    General: Skin is warm and dry.  Neurological:     General: No focal deficit present.     Mental Status: She is alert. Mental status is at baseline.     Coordination: Coordination abnormal.     Gait: Gait abnormal.     Comments: Tremor in fingers are worsened. Oriented to self.   Psychiatric:     Comments: Confused.      Labs reviewed: Recent Labs    10/28/19 0000 01/29/20 0000  NA 144 145  K  4.9 4.3  CL 105 103  CO2 33* 34*  BUN 29* 41*  CREATININE 1.1 1.2*  CALCIUM 9.9 9.7   Recent Labs    10/28/19 0000 01/29/20 0000  AST 15 12*  ALT 6* 7  ALKPHOS 43 44  ALBUMIN 3.5 3.6   Recent Labs    10/28/19 0000 01/29/20 0000  WBC 8.1 11.9  NEUTROABS  --  8,342  HGB 11.4* 12.1  HCT 34* 36  PLT 186 144*   Lab Results  Component Value Date   TSH 0.76 05/21/2019   No results found for: HGBA1C No results found for: CHOL, HDL, LDLCALC, LDLDIRECT, TRIG, CHOLHDL  Significant Diagnostic Results in last 30 days:  No results found.  Assessment/Plan There are no diagnoses linked to this encounter.   Family/ staff Communication:   Labs/tests ordered:

## 2020-09-09 NOTE — Assessment & Plan Note (Signed)
Anxiety/depression, able to redirected with activities, prn Lorazepam.

## 2020-09-09 NOTE — Assessment & Plan Note (Signed)
Constipation, takes Senokot SII qhs, MOM qod.

## 2020-09-09 NOTE — Assessment & Plan Note (Signed)
Essential tremor,dependent of ADLs, offPropranolol

## 2020-09-09 NOTE — Assessment & Plan Note (Signed)
Advanced dementia, no safety awareness

## 2020-09-12 DIAGNOSIS — F0280 Dementia in other diseases classified elsewhere without behavioral disturbance: Secondary | ICD-10-CM | POA: Diagnosis not present

## 2020-09-12 DIAGNOSIS — G309 Alzheimer's disease, unspecified: Secondary | ICD-10-CM | POA: Diagnosis not present

## 2020-09-12 DIAGNOSIS — I130 Hypertensive heart and chronic kidney disease with heart failure and stage 1 through stage 4 chronic kidney disease, or unspecified chronic kidney disease: Secondary | ICD-10-CM | POA: Diagnosis not present

## 2020-09-12 DIAGNOSIS — N183 Chronic kidney disease, stage 3 unspecified: Secondary | ICD-10-CM | POA: Diagnosis not present

## 2020-09-12 DIAGNOSIS — I509 Heart failure, unspecified: Secondary | ICD-10-CM | POA: Diagnosis not present

## 2020-09-12 DIAGNOSIS — R296 Repeated falls: Secondary | ICD-10-CM | POA: Diagnosis not present

## 2020-09-14 DIAGNOSIS — G309 Alzheimer's disease, unspecified: Secondary | ICD-10-CM | POA: Diagnosis not present

## 2020-09-14 DIAGNOSIS — I130 Hypertensive heart and chronic kidney disease with heart failure and stage 1 through stage 4 chronic kidney disease, or unspecified chronic kidney disease: Secondary | ICD-10-CM | POA: Diagnosis not present

## 2020-09-14 DIAGNOSIS — F0280 Dementia in other diseases classified elsewhere without behavioral disturbance: Secondary | ICD-10-CM | POA: Diagnosis not present

## 2020-09-14 DIAGNOSIS — R296 Repeated falls: Secondary | ICD-10-CM | POA: Diagnosis not present

## 2020-09-14 DIAGNOSIS — N183 Chronic kidney disease, stage 3 unspecified: Secondary | ICD-10-CM | POA: Diagnosis not present

## 2020-09-14 DIAGNOSIS — I509 Heart failure, unspecified: Secondary | ICD-10-CM | POA: Diagnosis not present

## 2020-09-15 DIAGNOSIS — F0280 Dementia in other diseases classified elsewhere without behavioral disturbance: Secondary | ICD-10-CM | POA: Diagnosis not present

## 2020-09-15 DIAGNOSIS — I130 Hypertensive heart and chronic kidney disease with heart failure and stage 1 through stage 4 chronic kidney disease, or unspecified chronic kidney disease: Secondary | ICD-10-CM | POA: Diagnosis not present

## 2020-09-15 DIAGNOSIS — I509 Heart failure, unspecified: Secondary | ICD-10-CM | POA: Diagnosis not present

## 2020-09-15 DIAGNOSIS — G309 Alzheimer's disease, unspecified: Secondary | ICD-10-CM | POA: Diagnosis not present

## 2020-09-15 DIAGNOSIS — R296 Repeated falls: Secondary | ICD-10-CM | POA: Diagnosis not present

## 2020-09-15 DIAGNOSIS — N183 Chronic kidney disease, stage 3 unspecified: Secondary | ICD-10-CM | POA: Diagnosis not present

## 2020-09-16 DIAGNOSIS — I130 Hypertensive heart and chronic kidney disease with heart failure and stage 1 through stage 4 chronic kidney disease, or unspecified chronic kidney disease: Secondary | ICD-10-CM | POA: Diagnosis not present

## 2020-09-16 DIAGNOSIS — G309 Alzheimer's disease, unspecified: Secondary | ICD-10-CM | POA: Diagnosis not present

## 2020-09-16 DIAGNOSIS — F0280 Dementia in other diseases classified elsewhere without behavioral disturbance: Secondary | ICD-10-CM | POA: Diagnosis not present

## 2020-09-16 DIAGNOSIS — R296 Repeated falls: Secondary | ICD-10-CM | POA: Diagnosis not present

## 2020-09-16 DIAGNOSIS — I509 Heart failure, unspecified: Secondary | ICD-10-CM | POA: Diagnosis not present

## 2020-09-16 DIAGNOSIS — N183 Chronic kidney disease, stage 3 unspecified: Secondary | ICD-10-CM | POA: Diagnosis not present

## 2020-09-19 DIAGNOSIS — I130 Hypertensive heart and chronic kidney disease with heart failure and stage 1 through stage 4 chronic kidney disease, or unspecified chronic kidney disease: Secondary | ICD-10-CM | POA: Diagnosis not present

## 2020-09-19 DIAGNOSIS — G309 Alzheimer's disease, unspecified: Secondary | ICD-10-CM | POA: Diagnosis not present

## 2020-09-19 DIAGNOSIS — I509 Heart failure, unspecified: Secondary | ICD-10-CM | POA: Diagnosis not present

## 2020-09-19 DIAGNOSIS — R296 Repeated falls: Secondary | ICD-10-CM | POA: Diagnosis not present

## 2020-09-19 DIAGNOSIS — N183 Chronic kidney disease, stage 3 unspecified: Secondary | ICD-10-CM | POA: Diagnosis not present

## 2020-09-19 DIAGNOSIS — F0280 Dementia in other diseases classified elsewhere without behavioral disturbance: Secondary | ICD-10-CM | POA: Diagnosis not present

## 2020-09-21 DIAGNOSIS — R296 Repeated falls: Secondary | ICD-10-CM | POA: Diagnosis not present

## 2020-09-21 DIAGNOSIS — F0280 Dementia in other diseases classified elsewhere without behavioral disturbance: Secondary | ICD-10-CM | POA: Diagnosis not present

## 2020-09-21 DIAGNOSIS — G309 Alzheimer's disease, unspecified: Secondary | ICD-10-CM | POA: Diagnosis not present

## 2020-09-21 DIAGNOSIS — N183 Chronic kidney disease, stage 3 unspecified: Secondary | ICD-10-CM | POA: Diagnosis not present

## 2020-09-21 DIAGNOSIS — I130 Hypertensive heart and chronic kidney disease with heart failure and stage 1 through stage 4 chronic kidney disease, or unspecified chronic kidney disease: Secondary | ICD-10-CM | POA: Diagnosis not present

## 2020-09-21 DIAGNOSIS — I509 Heart failure, unspecified: Secondary | ICD-10-CM | POA: Diagnosis not present

## 2020-09-22 DIAGNOSIS — R296 Repeated falls: Secondary | ICD-10-CM | POA: Diagnosis not present

## 2020-09-22 DIAGNOSIS — I130 Hypertensive heart and chronic kidney disease with heart failure and stage 1 through stage 4 chronic kidney disease, or unspecified chronic kidney disease: Secondary | ICD-10-CM | POA: Diagnosis not present

## 2020-09-22 DIAGNOSIS — G309 Alzheimer's disease, unspecified: Secondary | ICD-10-CM | POA: Diagnosis not present

## 2020-09-22 DIAGNOSIS — F0280 Dementia in other diseases classified elsewhere without behavioral disturbance: Secondary | ICD-10-CM | POA: Diagnosis not present

## 2020-09-22 DIAGNOSIS — I509 Heart failure, unspecified: Secondary | ICD-10-CM | POA: Diagnosis not present

## 2020-09-22 DIAGNOSIS — N183 Chronic kidney disease, stage 3 unspecified: Secondary | ICD-10-CM | POA: Diagnosis not present

## 2020-09-23 DIAGNOSIS — G309 Alzheimer's disease, unspecified: Secondary | ICD-10-CM | POA: Diagnosis not present

## 2020-09-23 DIAGNOSIS — R296 Repeated falls: Secondary | ICD-10-CM | POA: Diagnosis not present

## 2020-09-23 DIAGNOSIS — N183 Chronic kidney disease, stage 3 unspecified: Secondary | ICD-10-CM | POA: Diagnosis not present

## 2020-09-23 DIAGNOSIS — I130 Hypertensive heart and chronic kidney disease with heart failure and stage 1 through stage 4 chronic kidney disease, or unspecified chronic kidney disease: Secondary | ICD-10-CM | POA: Diagnosis not present

## 2020-09-23 DIAGNOSIS — I509 Heart failure, unspecified: Secondary | ICD-10-CM | POA: Diagnosis not present

## 2020-09-23 DIAGNOSIS — F0280 Dementia in other diseases classified elsewhere without behavioral disturbance: Secondary | ICD-10-CM | POA: Diagnosis not present

## 2020-09-26 DIAGNOSIS — F0280 Dementia in other diseases classified elsewhere without behavioral disturbance: Secondary | ICD-10-CM | POA: Diagnosis not present

## 2020-09-26 DIAGNOSIS — N183 Chronic kidney disease, stage 3 unspecified: Secondary | ICD-10-CM | POA: Diagnosis not present

## 2020-09-26 DIAGNOSIS — G309 Alzheimer's disease, unspecified: Secondary | ICD-10-CM | POA: Diagnosis not present

## 2020-09-26 DIAGNOSIS — I509 Heart failure, unspecified: Secondary | ICD-10-CM | POA: Diagnosis not present

## 2020-09-26 DIAGNOSIS — I130 Hypertensive heart and chronic kidney disease with heart failure and stage 1 through stage 4 chronic kidney disease, or unspecified chronic kidney disease: Secondary | ICD-10-CM | POA: Diagnosis not present

## 2020-09-26 DIAGNOSIS — R296 Repeated falls: Secondary | ICD-10-CM | POA: Diagnosis not present

## 2020-09-30 DIAGNOSIS — N183 Chronic kidney disease, stage 3 unspecified: Secondary | ICD-10-CM | POA: Diagnosis not present

## 2020-09-30 DIAGNOSIS — I130 Hypertensive heart and chronic kidney disease with heart failure and stage 1 through stage 4 chronic kidney disease, or unspecified chronic kidney disease: Secondary | ICD-10-CM | POA: Diagnosis not present

## 2020-09-30 DIAGNOSIS — R296 Repeated falls: Secondary | ICD-10-CM | POA: Diagnosis not present

## 2020-09-30 DIAGNOSIS — G309 Alzheimer's disease, unspecified: Secondary | ICD-10-CM | POA: Diagnosis not present

## 2020-09-30 DIAGNOSIS — I509 Heart failure, unspecified: Secondary | ICD-10-CM | POA: Diagnosis not present

## 2020-09-30 DIAGNOSIS — F0280 Dementia in other diseases classified elsewhere without behavioral disturbance: Secondary | ICD-10-CM | POA: Diagnosis not present

## 2020-10-03 DIAGNOSIS — I509 Heart failure, unspecified: Secondary | ICD-10-CM | POA: Diagnosis not present

## 2020-10-03 DIAGNOSIS — G309 Alzheimer's disease, unspecified: Secondary | ICD-10-CM | POA: Diagnosis not present

## 2020-10-03 DIAGNOSIS — F0280 Dementia in other diseases classified elsewhere without behavioral disturbance: Secondary | ICD-10-CM | POA: Diagnosis not present

## 2020-10-03 DIAGNOSIS — N183 Chronic kidney disease, stage 3 unspecified: Secondary | ICD-10-CM | POA: Diagnosis not present

## 2020-10-03 DIAGNOSIS — I130 Hypertensive heart and chronic kidney disease with heart failure and stage 1 through stage 4 chronic kidney disease, or unspecified chronic kidney disease: Secondary | ICD-10-CM | POA: Diagnosis not present

## 2020-10-03 DIAGNOSIS — R296 Repeated falls: Secondary | ICD-10-CM | POA: Diagnosis not present

## 2020-10-05 DIAGNOSIS — F418 Other specified anxiety disorders: Secondary | ICD-10-CM | POA: Diagnosis not present

## 2020-10-05 DIAGNOSIS — Z87440 Personal history of urinary (tract) infections: Secondary | ICD-10-CM | POA: Diagnosis not present

## 2020-10-05 DIAGNOSIS — I7300 Raynaud's syndrome without gangrene: Secondary | ICD-10-CM | POA: Diagnosis not present

## 2020-10-05 DIAGNOSIS — K5909 Other constipation: Secondary | ICD-10-CM | POA: Diagnosis not present

## 2020-10-05 DIAGNOSIS — I4891 Unspecified atrial fibrillation: Secondary | ICD-10-CM | POA: Diagnosis not present

## 2020-10-05 DIAGNOSIS — F0280 Dementia in other diseases classified elsewhere without behavioral disturbance: Secondary | ICD-10-CM | POA: Diagnosis not present

## 2020-10-05 DIAGNOSIS — E46 Unspecified protein-calorie malnutrition: Secondary | ICD-10-CM | POA: Diagnosis not present

## 2020-10-05 DIAGNOSIS — Z993 Dependence on wheelchair: Secondary | ICD-10-CM | POA: Diagnosis not present

## 2020-10-05 DIAGNOSIS — D631 Anemia in chronic kidney disease: Secondary | ICD-10-CM | POA: Diagnosis not present

## 2020-10-05 DIAGNOSIS — H01003 Unspecified blepharitis right eye, unspecified eyelid: Secondary | ICD-10-CM | POA: Diagnosis not present

## 2020-10-05 DIAGNOSIS — Z741 Need for assistance with personal care: Secondary | ICD-10-CM | POA: Diagnosis not present

## 2020-10-05 DIAGNOSIS — G250 Essential tremor: Secondary | ICD-10-CM | POA: Diagnosis not present

## 2020-10-05 DIAGNOSIS — R32 Unspecified urinary incontinence: Secondary | ICD-10-CM | POA: Diagnosis not present

## 2020-10-05 DIAGNOSIS — Z947 Corneal transplant status: Secondary | ICD-10-CM | POA: Diagnosis not present

## 2020-10-05 DIAGNOSIS — R296 Repeated falls: Secondary | ICD-10-CM | POA: Diagnosis not present

## 2020-10-05 DIAGNOSIS — S22080D Wedge compression fracture of T11-T12 vertebra, subsequent encounter for fracture with routine healing: Secondary | ICD-10-CM | POA: Diagnosis not present

## 2020-10-05 DIAGNOSIS — N183 Chronic kidney disease, stage 3 unspecified: Secondary | ICD-10-CM | POA: Diagnosis not present

## 2020-10-05 DIAGNOSIS — Z681 Body mass index (BMI) 19 or less, adult: Secondary | ICD-10-CM | POA: Diagnosis not present

## 2020-10-05 DIAGNOSIS — R159 Full incontinence of feces: Secondary | ICD-10-CM | POA: Diagnosis not present

## 2020-10-05 DIAGNOSIS — I509 Heart failure, unspecified: Secondary | ICD-10-CM | POA: Diagnosis not present

## 2020-10-05 DIAGNOSIS — I130 Hypertensive heart and chronic kidney disease with heart failure and stage 1 through stage 4 chronic kidney disease, or unspecified chronic kidney disease: Secondary | ICD-10-CM | POA: Diagnosis not present

## 2020-10-05 DIAGNOSIS — G309 Alzheimer's disease, unspecified: Secondary | ICD-10-CM | POA: Diagnosis not present

## 2020-10-07 DIAGNOSIS — G309 Alzheimer's disease, unspecified: Secondary | ICD-10-CM | POA: Diagnosis not present

## 2020-10-07 DIAGNOSIS — I509 Heart failure, unspecified: Secondary | ICD-10-CM | POA: Diagnosis not present

## 2020-10-07 DIAGNOSIS — I130 Hypertensive heart and chronic kidney disease with heart failure and stage 1 through stage 4 chronic kidney disease, or unspecified chronic kidney disease: Secondary | ICD-10-CM | POA: Diagnosis not present

## 2020-10-07 DIAGNOSIS — R296 Repeated falls: Secondary | ICD-10-CM | POA: Diagnosis not present

## 2020-10-07 DIAGNOSIS — N183 Chronic kidney disease, stage 3 unspecified: Secondary | ICD-10-CM | POA: Diagnosis not present

## 2020-10-07 DIAGNOSIS — F0280 Dementia in other diseases classified elsewhere without behavioral disturbance: Secondary | ICD-10-CM | POA: Diagnosis not present

## 2020-10-10 DIAGNOSIS — N183 Chronic kidney disease, stage 3 unspecified: Secondary | ICD-10-CM | POA: Diagnosis not present

## 2020-10-10 DIAGNOSIS — R296 Repeated falls: Secondary | ICD-10-CM | POA: Diagnosis not present

## 2020-10-10 DIAGNOSIS — I509 Heart failure, unspecified: Secondary | ICD-10-CM | POA: Diagnosis not present

## 2020-10-10 DIAGNOSIS — I130 Hypertensive heart and chronic kidney disease with heart failure and stage 1 through stage 4 chronic kidney disease, or unspecified chronic kidney disease: Secondary | ICD-10-CM | POA: Diagnosis not present

## 2020-10-10 DIAGNOSIS — G309 Alzheimer's disease, unspecified: Secondary | ICD-10-CM | POA: Diagnosis not present

## 2020-10-10 DIAGNOSIS — F0280 Dementia in other diseases classified elsewhere without behavioral disturbance: Secondary | ICD-10-CM | POA: Diagnosis not present

## 2020-10-14 DIAGNOSIS — R296 Repeated falls: Secondary | ICD-10-CM | POA: Diagnosis not present

## 2020-10-14 DIAGNOSIS — I130 Hypertensive heart and chronic kidney disease with heart failure and stage 1 through stage 4 chronic kidney disease, or unspecified chronic kidney disease: Secondary | ICD-10-CM | POA: Diagnosis not present

## 2020-10-14 DIAGNOSIS — N183 Chronic kidney disease, stage 3 unspecified: Secondary | ICD-10-CM | POA: Diagnosis not present

## 2020-10-14 DIAGNOSIS — G309 Alzheimer's disease, unspecified: Secondary | ICD-10-CM | POA: Diagnosis not present

## 2020-10-14 DIAGNOSIS — I509 Heart failure, unspecified: Secondary | ICD-10-CM | POA: Diagnosis not present

## 2020-10-14 DIAGNOSIS — F0280 Dementia in other diseases classified elsewhere without behavioral disturbance: Secondary | ICD-10-CM | POA: Diagnosis not present

## 2020-10-17 DIAGNOSIS — N183 Chronic kidney disease, stage 3 unspecified: Secondary | ICD-10-CM | POA: Diagnosis not present

## 2020-10-17 DIAGNOSIS — F0280 Dementia in other diseases classified elsewhere without behavioral disturbance: Secondary | ICD-10-CM | POA: Diagnosis not present

## 2020-10-17 DIAGNOSIS — R296 Repeated falls: Secondary | ICD-10-CM | POA: Diagnosis not present

## 2020-10-17 DIAGNOSIS — G309 Alzheimer's disease, unspecified: Secondary | ICD-10-CM | POA: Diagnosis not present

## 2020-10-17 DIAGNOSIS — I509 Heart failure, unspecified: Secondary | ICD-10-CM | POA: Diagnosis not present

## 2020-10-17 DIAGNOSIS — I130 Hypertensive heart and chronic kidney disease with heart failure and stage 1 through stage 4 chronic kidney disease, or unspecified chronic kidney disease: Secondary | ICD-10-CM | POA: Diagnosis not present

## 2020-10-18 DIAGNOSIS — N183 Chronic kidney disease, stage 3 unspecified: Secondary | ICD-10-CM | POA: Diagnosis not present

## 2020-10-18 DIAGNOSIS — F0280 Dementia in other diseases classified elsewhere without behavioral disturbance: Secondary | ICD-10-CM | POA: Diagnosis not present

## 2020-10-18 DIAGNOSIS — G309 Alzheimer's disease, unspecified: Secondary | ICD-10-CM | POA: Diagnosis not present

## 2020-10-18 DIAGNOSIS — I509 Heart failure, unspecified: Secondary | ICD-10-CM | POA: Diagnosis not present

## 2020-10-18 DIAGNOSIS — I130 Hypertensive heart and chronic kidney disease with heart failure and stage 1 through stage 4 chronic kidney disease, or unspecified chronic kidney disease: Secondary | ICD-10-CM | POA: Diagnosis not present

## 2020-10-18 DIAGNOSIS — R296 Repeated falls: Secondary | ICD-10-CM | POA: Diagnosis not present

## 2020-10-19 ENCOUNTER — Non-Acute Institutional Stay (SKILLED_NURSING_FACILITY): Payer: Medicare Other | Admitting: Internal Medicine

## 2020-10-19 ENCOUNTER — Encounter: Payer: Self-pay | Admitting: Internal Medicine

## 2020-10-19 DIAGNOSIS — F0150 Vascular dementia without behavioral disturbance: Secondary | ICD-10-CM

## 2020-10-19 DIAGNOSIS — R627 Adult failure to thrive: Secondary | ICD-10-CM

## 2020-10-19 DIAGNOSIS — R269 Unspecified abnormalities of gait and mobility: Secondary | ICD-10-CM

## 2020-10-19 DIAGNOSIS — F339 Major depressive disorder, recurrent, unspecified: Secondary | ICD-10-CM

## 2020-10-19 DIAGNOSIS — G309 Alzheimer's disease, unspecified: Secondary | ICD-10-CM | POA: Diagnosis not present

## 2020-10-19 DIAGNOSIS — F0280 Dementia in other diseases classified elsewhere without behavioral disturbance: Secondary | ICD-10-CM

## 2020-10-19 DIAGNOSIS — M545 Low back pain, unspecified: Secondary | ICD-10-CM | POA: Diagnosis not present

## 2020-10-19 DIAGNOSIS — G250 Essential tremor: Secondary | ICD-10-CM

## 2020-10-19 DIAGNOSIS — I482 Chronic atrial fibrillation, unspecified: Secondary | ICD-10-CM

## 2020-10-19 NOTE — Progress Notes (Signed)
Location:  [facility] Room Number: 31 Place of Service:  SNF [phone])  Provider:   Code Status: DNR Johny Shock Goals of Care:  Advanced Directives 08/11/2020  Does Patient Have a Medical Advance Directive? Yes  Type of Advance Directive Healthcare Power of Attorney  Does patient want to make changes to medical advance directive? No - Patient declined  Copy of Healthcare Power of Attorney in Chart? Yes - validated most recent copy scanned in chart (See row information)  Would patient like information on creating a medical advance directive? -  Pre-existing out of facility DNR order (yellow form or pink MOST form) Yellow form placed in chart (order not valid for inpatient use)     Chief Complaint  Patient presents with  . Medical Management of Chronic Issues    HPI: Patient is a 87 y.o. female seen today for medical management of chronic diseases.    Patient has h/o Atrial Fibrillation,LE edema, Hypertension, Dysphagia,Essential Tremor, Anemia, Dementia,and depression, FallsWeight loss Patient is a long-term resident  She is enrolled in hospice Her Weight stable No New issues Wheelchair dependent Has Aphasia   Past Medical History:  Diagnosis Date  . Anticoagulant long-term use   . Atrial fibrillation (HCC)   . CHF (congestive heart failure) (HCC)   . Chronic anticoagulation   . Hypertension   . Raynaud phenomenon   . Tremor, essential     Past Surgical History:  Procedure Laterality Date  . CHOLECYSTECTOMY    . TONSILLECTOMY      Allergies  Allergen Reactions  . Fosamax [Alendronate]   . Other Nausea And Vomiting    Green pepper    Outpatient Encounter Medications as of 10/19/2020  Medication Sig  . acetaminophen (TYLENOL) 500 MG tablet Take 500 mg by mouth every 8 (eight) hours as needed for mild pain or moderate pain.   Marland Kitchen acetaminophen (TYLENOL) 500 MG tablet Take 1,000 mg by mouth 2 (two) times daily. Not to exceed 3,00 mg daily.   . Calcium Carbonate-Vit D-Min (CALTRATE 600+D PLUS MINERALS) 600-800 MG-UNIT CHEW Chew 1 tablet by mouth 2 (two) times a day.  . magnesium hydroxide (MILK OF MAGNESIA) 400 MG/5ML suspension Take by mouth every other day.  . NON FORMULARY Regular, Nectar Thick, Mech Soft Special Instructions: Regular diet with Nectar thick liquids by straw. 2 handled cup and divided plate with built-up utensils for meals.  . sennosides-docusate sodium (SENOKOT-S) 8.6-50 MG tablet Take 2 tablets by mouth at bedtime.   . [DISCONTINUED] LORazepam (ATIVAN) 0.5 MG tablet Take 1 tablet (0.5 mg total) by mouth daily as needed for anxiety.   No facility-administered encounter medications on file as of 10/19/2020.    Review of Systems:  Review of Systems  Unable to perform ROS: Dementia    Health Maintenance  Topic Date Due  . TETANUS/TDAP  10/09/2027  . INFLUENZA VACCINE  Completed  . DEXA SCAN  Completed  . COVID-19 Vaccine  Completed  . PNA vac Low Risk Adult  Completed  . HPV VACCINES  Aged Out    Physical Exam: Vitals:   10/19/20 1546  BP: 118/60  Pulse: 88  Resp: 18  Temp: (!) 97.5 F (36.4 C)  SpO2: 94%  Weight: 101 lb 3.2 oz (45.9 kg)  Height: 5\' 4"  (1.626 m)   Body mass index is 17.37 kg/m. Physical Exam Vitals reviewed.  Constitutional:      Appearance: Normal appearance.     Comments: Very frail  HENT:  Head: Normocephalic.     Nose: Nose normal.     Mouth/Throat:     Mouth: Mucous membranes are moist.     Pharynx: Oropharynx is clear.  Eyes:     Pupils: Pupils are equal, round, and reactive to light.  Cardiovascular:     Rate and Rhythm: Normal rate. Rhythm irregular.     Pulses: Normal pulses.  Pulmonary:     Effort: Pulmonary effort is normal.     Breath sounds: Normal breath sounds.  Abdominal:     General: Abdomen is flat. Bowel sounds are normal.     Palpations: Abdomen is soft.  Musculoskeletal:        General: No swelling.     Cervical back: Neck supple.   Skin:    General: Skin is warm.  Neurological:     General: No focal deficit present.     Mental Status: She is alert.  Psychiatric:        Mood and Affect: Mood normal.        Thought Content: Thought content normal.     Labs reviewed: Basic Metabolic Panel: Recent Labs    10/28/19 0000 01/29/20 0000  NA 144 145  K 4.9 4.3  CL 105 103  CO2 33* 34*  BUN 29* 41*  CREATININE 1.1 1.2*  CALCIUM 9.9 9.7   Liver Function Tests: Recent Labs    10/28/19 0000 01/29/20 0000  AST 15 12*  ALT 6* 7  ALKPHOS 43 44  ALBUMIN 3.5 3.6   No results for input(s): LIPASE, AMYLASE in the last 8760 hours. No results for input(s): AMMONIA in the last 8760 hours. CBC: Recent Labs    10/28/19 0000 01/29/20 0000  WBC 8.1 11.9  NEUTROABS  --  8,342  HGB 11.4* 12.1  HCT 34* 36  PLT 186 144*   Lipid Panel: No results for input(s): CHOL, HDL, LDLCALC, TRIG, CHOLHDL, LDLDIRECT in the last 8760 hours. No results found for: HGBA1C  Procedures since last visit: No results found.  Assessment/Plan 1. Bilateral low back pain without sciatica, unspecified chronicity Stable on Tylenol  2. Depression, recurrent (HCC) Weight is stable  3. Tremor, essential Supportive care  4. Gait abnormality Continues to be high risk for falls  5. Mixed Alzheimer's and vascular dementia (HCC) Supportive care  6. Adult failure to thrive Enrolled in hospice  7. Chronic a-fib (HCC) Off Eliquis due to falls   Labs/tests ordered:  * No order type specified * Next appt:  Visit date not found

## 2020-10-21 DIAGNOSIS — N183 Chronic kidney disease, stage 3 unspecified: Secondary | ICD-10-CM | POA: Diagnosis not present

## 2020-10-21 DIAGNOSIS — I130 Hypertensive heart and chronic kidney disease with heart failure and stage 1 through stage 4 chronic kidney disease, or unspecified chronic kidney disease: Secondary | ICD-10-CM | POA: Diagnosis not present

## 2020-10-21 DIAGNOSIS — F0280 Dementia in other diseases classified elsewhere without behavioral disturbance: Secondary | ICD-10-CM | POA: Diagnosis not present

## 2020-10-21 DIAGNOSIS — I509 Heart failure, unspecified: Secondary | ICD-10-CM | POA: Diagnosis not present

## 2020-10-21 DIAGNOSIS — R296 Repeated falls: Secondary | ICD-10-CM | POA: Diagnosis not present

## 2020-10-21 DIAGNOSIS — G309 Alzheimer's disease, unspecified: Secondary | ICD-10-CM | POA: Diagnosis not present

## 2020-10-23 DIAGNOSIS — R296 Repeated falls: Secondary | ICD-10-CM | POA: Diagnosis not present

## 2020-10-23 DIAGNOSIS — I509 Heart failure, unspecified: Secondary | ICD-10-CM | POA: Diagnosis not present

## 2020-10-23 DIAGNOSIS — F0280 Dementia in other diseases classified elsewhere without behavioral disturbance: Secondary | ICD-10-CM | POA: Diagnosis not present

## 2020-10-23 DIAGNOSIS — G309 Alzheimer's disease, unspecified: Secondary | ICD-10-CM | POA: Diagnosis not present

## 2020-10-23 DIAGNOSIS — I130 Hypertensive heart and chronic kidney disease with heart failure and stage 1 through stage 4 chronic kidney disease, or unspecified chronic kidney disease: Secondary | ICD-10-CM | POA: Diagnosis not present

## 2020-10-23 DIAGNOSIS — N183 Chronic kidney disease, stage 3 unspecified: Secondary | ICD-10-CM | POA: Diagnosis not present

## 2020-10-24 DIAGNOSIS — I130 Hypertensive heart and chronic kidney disease with heart failure and stage 1 through stage 4 chronic kidney disease, or unspecified chronic kidney disease: Secondary | ICD-10-CM | POA: Diagnosis not present

## 2020-10-24 DIAGNOSIS — G309 Alzheimer's disease, unspecified: Secondary | ICD-10-CM | POA: Diagnosis not present

## 2020-10-24 DIAGNOSIS — I509 Heart failure, unspecified: Secondary | ICD-10-CM | POA: Diagnosis not present

## 2020-10-24 DIAGNOSIS — F0280 Dementia in other diseases classified elsewhere without behavioral disturbance: Secondary | ICD-10-CM | POA: Diagnosis not present

## 2020-10-24 DIAGNOSIS — R296 Repeated falls: Secondary | ICD-10-CM | POA: Diagnosis not present

## 2020-10-24 DIAGNOSIS — N183 Chronic kidney disease, stage 3 unspecified: Secondary | ICD-10-CM | POA: Diagnosis not present

## 2020-10-26 DIAGNOSIS — N183 Chronic kidney disease, stage 3 unspecified: Secondary | ICD-10-CM | POA: Diagnosis not present

## 2020-10-26 DIAGNOSIS — I509 Heart failure, unspecified: Secondary | ICD-10-CM | POA: Diagnosis not present

## 2020-10-26 DIAGNOSIS — F0280 Dementia in other diseases classified elsewhere without behavioral disturbance: Secondary | ICD-10-CM | POA: Diagnosis not present

## 2020-10-26 DIAGNOSIS — G309 Alzheimer's disease, unspecified: Secondary | ICD-10-CM | POA: Diagnosis not present

## 2020-10-26 DIAGNOSIS — R296 Repeated falls: Secondary | ICD-10-CM | POA: Diagnosis not present

## 2020-10-26 DIAGNOSIS — I130 Hypertensive heart and chronic kidney disease with heart failure and stage 1 through stage 4 chronic kidney disease, or unspecified chronic kidney disease: Secondary | ICD-10-CM | POA: Diagnosis not present

## 2020-10-27 DIAGNOSIS — R296 Repeated falls: Secondary | ICD-10-CM | POA: Diagnosis not present

## 2020-10-27 DIAGNOSIS — I130 Hypertensive heart and chronic kidney disease with heart failure and stage 1 through stage 4 chronic kidney disease, or unspecified chronic kidney disease: Secondary | ICD-10-CM | POA: Diagnosis not present

## 2020-10-27 DIAGNOSIS — I509 Heart failure, unspecified: Secondary | ICD-10-CM | POA: Diagnosis not present

## 2020-10-27 DIAGNOSIS — G309 Alzheimer's disease, unspecified: Secondary | ICD-10-CM | POA: Diagnosis not present

## 2020-10-27 DIAGNOSIS — F0280 Dementia in other diseases classified elsewhere without behavioral disturbance: Secondary | ICD-10-CM | POA: Diagnosis not present

## 2020-10-27 DIAGNOSIS — N183 Chronic kidney disease, stage 3 unspecified: Secondary | ICD-10-CM | POA: Diagnosis not present

## 2020-10-28 DIAGNOSIS — N183 Chronic kidney disease, stage 3 unspecified: Secondary | ICD-10-CM | POA: Diagnosis not present

## 2020-10-28 DIAGNOSIS — G309 Alzheimer's disease, unspecified: Secondary | ICD-10-CM | POA: Diagnosis not present

## 2020-10-28 DIAGNOSIS — F0280 Dementia in other diseases classified elsewhere without behavioral disturbance: Secondary | ICD-10-CM | POA: Diagnosis not present

## 2020-10-28 DIAGNOSIS — I509 Heart failure, unspecified: Secondary | ICD-10-CM | POA: Diagnosis not present

## 2020-10-28 DIAGNOSIS — I130 Hypertensive heart and chronic kidney disease with heart failure and stage 1 through stage 4 chronic kidney disease, or unspecified chronic kidney disease: Secondary | ICD-10-CM | POA: Diagnosis not present

## 2020-10-28 DIAGNOSIS — R296 Repeated falls: Secondary | ICD-10-CM | POA: Diagnosis not present

## 2020-10-28 LAB — COMPREHENSIVE METABOLIC PANEL
Albumin: 3.4 — AB (ref 3.5–5.0)
Calcium: 8.7 (ref 8.7–10.7)
Globulin: 2.4

## 2020-10-28 LAB — CBC AND DIFFERENTIAL
HCT: 35 — AB (ref 36–46)
Hemoglobin: 11.6 — AB (ref 12.0–16.0)
Platelets: 233 (ref 150–399)
WBC: 5.4

## 2020-10-28 LAB — HEPATIC FUNCTION PANEL
ALT: 16 (ref 7–35)
AST: 18 (ref 13–35)
Alkaline Phosphatase: 63 (ref 25–125)
Bilirubin, Total: 0.3

## 2020-10-28 LAB — BASIC METABOLIC PANEL
BUN: 17 (ref 4–21)
CO2: 27 — AB (ref 13–22)
Chloride: 101 (ref 99–108)
Creatinine: 0.7 (ref 0.5–1.1)
Glucose: 89
Potassium: 4.5 (ref 3.4–5.3)
Sodium: 135 — AB (ref 137–147)

## 2020-10-28 LAB — CBC: RBC: 3.64 — AB (ref 3.87–5.11)

## 2020-10-28 LAB — TSH: TSH: 2.65 (ref 0.41–5.90)

## 2020-11-02 DIAGNOSIS — G309 Alzheimer's disease, unspecified: Secondary | ICD-10-CM | POA: Diagnosis not present

## 2020-11-02 DIAGNOSIS — I130 Hypertensive heart and chronic kidney disease with heart failure and stage 1 through stage 4 chronic kidney disease, or unspecified chronic kidney disease: Secondary | ICD-10-CM | POA: Diagnosis not present

## 2020-11-02 DIAGNOSIS — I509 Heart failure, unspecified: Secondary | ICD-10-CM | POA: Diagnosis not present

## 2020-11-02 DIAGNOSIS — R296 Repeated falls: Secondary | ICD-10-CM | POA: Diagnosis not present

## 2020-11-02 DIAGNOSIS — N183 Chronic kidney disease, stage 3 unspecified: Secondary | ICD-10-CM | POA: Diagnosis not present

## 2020-11-02 DIAGNOSIS — F0280 Dementia in other diseases classified elsewhere without behavioral disturbance: Secondary | ICD-10-CM | POA: Diagnosis not present

## 2020-11-04 DIAGNOSIS — I130 Hypertensive heart and chronic kidney disease with heart failure and stage 1 through stage 4 chronic kidney disease, or unspecified chronic kidney disease: Secondary | ICD-10-CM | POA: Diagnosis not present

## 2020-11-04 DIAGNOSIS — R296 Repeated falls: Secondary | ICD-10-CM | POA: Diagnosis not present

## 2020-11-04 DIAGNOSIS — I509 Heart failure, unspecified: Secondary | ICD-10-CM | POA: Diagnosis not present

## 2020-11-04 DIAGNOSIS — N183 Chronic kidney disease, stage 3 unspecified: Secondary | ICD-10-CM | POA: Diagnosis not present

## 2020-11-04 DIAGNOSIS — G309 Alzheimer's disease, unspecified: Secondary | ICD-10-CM | POA: Diagnosis not present

## 2020-11-04 DIAGNOSIS — F0280 Dementia in other diseases classified elsewhere without behavioral disturbance: Secondary | ICD-10-CM | POA: Diagnosis not present

## 2020-11-05 DIAGNOSIS — Z993 Dependence on wheelchair: Secondary | ICD-10-CM | POA: Diagnosis not present

## 2020-11-05 DIAGNOSIS — S22080D Wedge compression fracture of T11-T12 vertebra, subsequent encounter for fracture with routine healing: Secondary | ICD-10-CM | POA: Diagnosis not present

## 2020-11-05 DIAGNOSIS — G309 Alzheimer's disease, unspecified: Secondary | ICD-10-CM | POA: Diagnosis not present

## 2020-11-05 DIAGNOSIS — D631 Anemia in chronic kidney disease: Secondary | ICD-10-CM | POA: Diagnosis not present

## 2020-11-05 DIAGNOSIS — F0280 Dementia in other diseases classified elsewhere without behavioral disturbance: Secondary | ICD-10-CM | POA: Diagnosis not present

## 2020-11-05 DIAGNOSIS — Z947 Corneal transplant status: Secondary | ICD-10-CM | POA: Diagnosis not present

## 2020-11-05 DIAGNOSIS — N183 Chronic kidney disease, stage 3 unspecified: Secondary | ICD-10-CM | POA: Diagnosis not present

## 2020-11-05 DIAGNOSIS — I130 Hypertensive heart and chronic kidney disease with heart failure and stage 1 through stage 4 chronic kidney disease, or unspecified chronic kidney disease: Secondary | ICD-10-CM | POA: Diagnosis not present

## 2020-11-05 DIAGNOSIS — I4891 Unspecified atrial fibrillation: Secondary | ICD-10-CM | POA: Diagnosis not present

## 2020-11-05 DIAGNOSIS — K5909 Other constipation: Secondary | ICD-10-CM | POA: Diagnosis not present

## 2020-11-05 DIAGNOSIS — R32 Unspecified urinary incontinence: Secondary | ICD-10-CM | POA: Diagnosis not present

## 2020-11-05 DIAGNOSIS — Z741 Need for assistance with personal care: Secondary | ICD-10-CM | POA: Diagnosis not present

## 2020-11-05 DIAGNOSIS — H01003 Unspecified blepharitis right eye, unspecified eyelid: Secondary | ICD-10-CM | POA: Diagnosis not present

## 2020-11-05 DIAGNOSIS — Z681 Body mass index (BMI) 19 or less, adult: Secondary | ICD-10-CM | POA: Diagnosis not present

## 2020-11-05 DIAGNOSIS — I7300 Raynaud's syndrome without gangrene: Secondary | ICD-10-CM | POA: Diagnosis not present

## 2020-11-05 DIAGNOSIS — R296 Repeated falls: Secondary | ICD-10-CM | POA: Diagnosis not present

## 2020-11-05 DIAGNOSIS — G250 Essential tremor: Secondary | ICD-10-CM | POA: Diagnosis not present

## 2020-11-05 DIAGNOSIS — E46 Unspecified protein-calorie malnutrition: Secondary | ICD-10-CM | POA: Diagnosis not present

## 2020-11-05 DIAGNOSIS — I509 Heart failure, unspecified: Secondary | ICD-10-CM | POA: Diagnosis not present

## 2020-11-05 DIAGNOSIS — F418 Other specified anxiety disorders: Secondary | ICD-10-CM | POA: Diagnosis not present

## 2020-11-05 DIAGNOSIS — R159 Full incontinence of feces: Secondary | ICD-10-CM | POA: Diagnosis not present

## 2020-11-05 DIAGNOSIS — Z87440 Personal history of urinary (tract) infections: Secondary | ICD-10-CM | POA: Diagnosis not present

## 2020-11-09 DIAGNOSIS — G309 Alzheimer's disease, unspecified: Secondary | ICD-10-CM | POA: Diagnosis not present

## 2020-11-09 DIAGNOSIS — N183 Chronic kidney disease, stage 3 unspecified: Secondary | ICD-10-CM | POA: Diagnosis not present

## 2020-11-09 DIAGNOSIS — F0280 Dementia in other diseases classified elsewhere without behavioral disturbance: Secondary | ICD-10-CM | POA: Diagnosis not present

## 2020-11-09 DIAGNOSIS — R296 Repeated falls: Secondary | ICD-10-CM | POA: Diagnosis not present

## 2020-11-09 DIAGNOSIS — I509 Heart failure, unspecified: Secondary | ICD-10-CM | POA: Diagnosis not present

## 2020-11-09 DIAGNOSIS — I130 Hypertensive heart and chronic kidney disease with heart failure and stage 1 through stage 4 chronic kidney disease, or unspecified chronic kidney disease: Secondary | ICD-10-CM | POA: Diagnosis not present

## 2020-11-11 DIAGNOSIS — R296 Repeated falls: Secondary | ICD-10-CM | POA: Diagnosis not present

## 2020-11-11 DIAGNOSIS — I130 Hypertensive heart and chronic kidney disease with heart failure and stage 1 through stage 4 chronic kidney disease, or unspecified chronic kidney disease: Secondary | ICD-10-CM | POA: Diagnosis not present

## 2020-11-11 DIAGNOSIS — N183 Chronic kidney disease, stage 3 unspecified: Secondary | ICD-10-CM | POA: Diagnosis not present

## 2020-11-11 DIAGNOSIS — F0280 Dementia in other diseases classified elsewhere without behavioral disturbance: Secondary | ICD-10-CM | POA: Diagnosis not present

## 2020-11-11 DIAGNOSIS — I509 Heart failure, unspecified: Secondary | ICD-10-CM | POA: Diagnosis not present

## 2020-11-11 DIAGNOSIS — G309 Alzheimer's disease, unspecified: Secondary | ICD-10-CM | POA: Diagnosis not present

## 2020-11-15 DIAGNOSIS — R296 Repeated falls: Secondary | ICD-10-CM | POA: Diagnosis not present

## 2020-11-15 DIAGNOSIS — I509 Heart failure, unspecified: Secondary | ICD-10-CM | POA: Diagnosis not present

## 2020-11-15 DIAGNOSIS — F0280 Dementia in other diseases classified elsewhere without behavioral disturbance: Secondary | ICD-10-CM | POA: Diagnosis not present

## 2020-11-15 DIAGNOSIS — G309 Alzheimer's disease, unspecified: Secondary | ICD-10-CM | POA: Diagnosis not present

## 2020-11-15 DIAGNOSIS — N183 Chronic kidney disease, stage 3 unspecified: Secondary | ICD-10-CM | POA: Diagnosis not present

## 2020-11-15 DIAGNOSIS — I130 Hypertensive heart and chronic kidney disease with heart failure and stage 1 through stage 4 chronic kidney disease, or unspecified chronic kidney disease: Secondary | ICD-10-CM | POA: Diagnosis not present

## 2020-11-16 DIAGNOSIS — I509 Heart failure, unspecified: Secondary | ICD-10-CM | POA: Diagnosis not present

## 2020-11-16 DIAGNOSIS — I130 Hypertensive heart and chronic kidney disease with heart failure and stage 1 through stage 4 chronic kidney disease, or unspecified chronic kidney disease: Secondary | ICD-10-CM | POA: Diagnosis not present

## 2020-11-16 DIAGNOSIS — N183 Chronic kidney disease, stage 3 unspecified: Secondary | ICD-10-CM | POA: Diagnosis not present

## 2020-11-16 DIAGNOSIS — R296 Repeated falls: Secondary | ICD-10-CM | POA: Diagnosis not present

## 2020-11-16 DIAGNOSIS — G309 Alzheimer's disease, unspecified: Secondary | ICD-10-CM | POA: Diagnosis not present

## 2020-11-16 DIAGNOSIS — F0280 Dementia in other diseases classified elsewhere without behavioral disturbance: Secondary | ICD-10-CM | POA: Diagnosis not present

## 2020-11-17 DIAGNOSIS — R296 Repeated falls: Secondary | ICD-10-CM | POA: Diagnosis not present

## 2020-11-17 DIAGNOSIS — N183 Chronic kidney disease, stage 3 unspecified: Secondary | ICD-10-CM | POA: Diagnosis not present

## 2020-11-17 DIAGNOSIS — F0280 Dementia in other diseases classified elsewhere without behavioral disturbance: Secondary | ICD-10-CM | POA: Diagnosis not present

## 2020-11-17 DIAGNOSIS — I509 Heart failure, unspecified: Secondary | ICD-10-CM | POA: Diagnosis not present

## 2020-11-17 DIAGNOSIS — G309 Alzheimer's disease, unspecified: Secondary | ICD-10-CM | POA: Diagnosis not present

## 2020-11-17 DIAGNOSIS — I130 Hypertensive heart and chronic kidney disease with heart failure and stage 1 through stage 4 chronic kidney disease, or unspecified chronic kidney disease: Secondary | ICD-10-CM | POA: Diagnosis not present

## 2020-11-18 DIAGNOSIS — I509 Heart failure, unspecified: Secondary | ICD-10-CM | POA: Diagnosis not present

## 2020-11-18 DIAGNOSIS — F0280 Dementia in other diseases classified elsewhere without behavioral disturbance: Secondary | ICD-10-CM | POA: Diagnosis not present

## 2020-11-18 DIAGNOSIS — G309 Alzheimer's disease, unspecified: Secondary | ICD-10-CM | POA: Diagnosis not present

## 2020-11-18 DIAGNOSIS — R296 Repeated falls: Secondary | ICD-10-CM | POA: Diagnosis not present

## 2020-11-18 DIAGNOSIS — I130 Hypertensive heart and chronic kidney disease with heart failure and stage 1 through stage 4 chronic kidney disease, or unspecified chronic kidney disease: Secondary | ICD-10-CM | POA: Diagnosis not present

## 2020-11-18 DIAGNOSIS — N183 Chronic kidney disease, stage 3 unspecified: Secondary | ICD-10-CM | POA: Diagnosis not present

## 2020-11-23 DIAGNOSIS — R296 Repeated falls: Secondary | ICD-10-CM | POA: Diagnosis not present

## 2020-11-23 DIAGNOSIS — G309 Alzheimer's disease, unspecified: Secondary | ICD-10-CM | POA: Diagnosis not present

## 2020-11-23 DIAGNOSIS — I509 Heart failure, unspecified: Secondary | ICD-10-CM | POA: Diagnosis not present

## 2020-11-23 DIAGNOSIS — I130 Hypertensive heart and chronic kidney disease with heart failure and stage 1 through stage 4 chronic kidney disease, or unspecified chronic kidney disease: Secondary | ICD-10-CM | POA: Diagnosis not present

## 2020-11-23 DIAGNOSIS — N183 Chronic kidney disease, stage 3 unspecified: Secondary | ICD-10-CM | POA: Diagnosis not present

## 2020-11-23 DIAGNOSIS — F0280 Dementia in other diseases classified elsewhere without behavioral disturbance: Secondary | ICD-10-CM | POA: Diagnosis not present

## 2020-11-24 ENCOUNTER — Non-Acute Institutional Stay (SKILLED_NURSING_FACILITY): Payer: Medicare Other | Admitting: Nurse Practitioner

## 2020-11-24 ENCOUNTER — Encounter: Payer: Self-pay | Admitting: Nurse Practitioner

## 2020-11-24 DIAGNOSIS — F0150 Vascular dementia without behavioral disturbance: Secondary | ICD-10-CM | POA: Diagnosis not present

## 2020-11-24 DIAGNOSIS — G309 Alzheimer's disease, unspecified: Secondary | ICD-10-CM | POA: Diagnosis not present

## 2020-11-24 DIAGNOSIS — R269 Unspecified abnormalities of gait and mobility: Secondary | ICD-10-CM

## 2020-11-24 DIAGNOSIS — R627 Adult failure to thrive: Secondary | ICD-10-CM

## 2020-11-24 DIAGNOSIS — F0280 Dementia in other diseases classified elsewhere without behavioral disturbance: Secondary | ICD-10-CM | POA: Diagnosis not present

## 2020-11-24 DIAGNOSIS — G250 Essential tremor: Secondary | ICD-10-CM

## 2020-11-24 DIAGNOSIS — F339 Major depressive disorder, recurrent, unspecified: Secondary | ICD-10-CM | POA: Diagnosis not present

## 2020-11-24 DIAGNOSIS — M545 Low back pain, unspecified: Secondary | ICD-10-CM

## 2020-11-24 DIAGNOSIS — K5901 Slow transit constipation: Secondary | ICD-10-CM | POA: Diagnosis not present

## 2020-11-24 NOTE — Assessment & Plan Note (Signed)
Advanced stage, no safety awareness, neal total care.

## 2020-11-24 NOTE — Progress Notes (Signed)
Location:   [facility] Room Number: 31 Place of Service:  SNF (31) Provider:  Derinda Bartus X, NP  Mahlon Gammon, MD  Patient Care Team: Mahlon Gammon, MD as PCP - General (Internal Medicine) Nahser, Deloris Ping, MD as PCP - Cardiology (Cardiology) Iyad Deroo X, NP as Nurse Practitioner (Internal Medicine)  Extended Emergency Contact Information Primary Emergency Contact: Morgan,Tom Address: [address]          [place], [place] [address] [place] Home Phone: [phone] Relation: Friend Secondary Emergency Contact: Brandy Hale [place] of [place] Home Phone: [phone] Relation: Niece  Code Status:  DNR Goals of care: Advanced Directive information Advanced Directives 11/24/2020  Does Patient Have a Medical Advance Directive? Yes  Type of Estate agent of [place];Out of facility DNR (pink MOST or yellow form)  Does patient want to make changes to medical advance directive? No - Patient declined  Copy of Healthcare Power of Attorney in Chart? Yes - validated most recent copy scanned in chart (See row information)  Would patient like information on creating a medical advance directive? -  Pre-existing out of facility DNR order (yellow form or pink MOST form) Yellow form placed in chart (order not valid for inpatient use)     Chief Complaint  Patient presents with  . Medical Management of Chronic Issues    Routine follow up visit.    HPI:  Pt is a 87 y.o. female seen today for medical management of chronic diseases.                 Advanced dementia, no safety awareness  FTT, supportive care, under Hospice service.  Worsened gait, frailty, wobbly gait, frequent falls Essential tremor,dependent of ADLs, offPropranolol Anxiety/depression, able to redirected with activities, Chronic lower back pain, takes Tylenol 1000mg  bid/500mg  q8hrs prn.   Constipation, takes Senokot SII qhs, MOM qod.   Past Medical History:  Diagnosis Date  . Anticoagulant long-term use   . Atrial fibrillation (HCC)   . CHF (congestive heart failure) (HCC)   . Chronic anticoagulation   . Hypertension   . Raynaud phenomenon   . Tremor, essential    Past Surgical History:  Procedure Laterality Date  . CHOLECYSTECTOMY    . TONSILLECTOMY      Allergies  Allergen Reactions  . Fosamax [Alendronate]   . Other Nausea And Vomiting    Green pepper    Allergies as of 11/24/2020      Reactions   Fosamax [alendronate]    Other Nausea And Vomiting   Green pepper      Medication List       Accurate as of November 24, 2020 11:59 PM. If you have any questions, ask your nurse or doctor.        STOP taking these medications   Caltrate 600+D Plus Minerals 600-800 MG-UNIT Chew Stopped by: Bedelia Pong X Zakia Sainato, NP     TAKE these medications   acetaminophen 500 MG tablet Commonly known as: TYLENOL Take 500 mg by mouth every 8 (eight) hours as needed for mild pain or moderate pain.   acetaminophen 500 MG tablet Commonly known as: TYLENOL Take 1,000 mg by mouth 2 (two) times daily. Not to exceed 3,00 mg daily.   magnesium hydroxide 400 MG/5ML suspension Commonly known as: MILK OF MAGNESIA Take by mouth every other day.   NON FORMULARY Regular, Nectar Thick, Mech Soft Special Instructions: Regular diet with Nectar thick liquids  by straw. 2 handled cup and divided plate with built-up utensils for meals.   RESOURCE PO Take by mouth. Special Instructions: RESUCE RESOURCE 2.0 TO 90 ML BID D/T REFUSALS AND POOR INTAKE Twice A Day   sennosides-docusate sodium 8.6-50 MG tablet Commonly known as: SENOKOT-S Take 2 tablets by mouth at bedtime.       Review of Systems  Constitutional: Negative for activity change, fever and unexpected weight change.  HENT: Positive for hearing loss. Negative for congestion and voice change.   Eyes: Negative for  visual disturbance.  Respiratory: Negative for shortness of breath.   Cardiovascular: Negative for leg swelling.  Gastrointestinal: Negative for abdominal pain and constipation.  Genitourinary: Negative for dysuria and urgency.  Musculoskeletal: Positive for arthralgias, back pain and gait problem.  Skin: Negative for color change.  Neurological: Positive for tremors. Negative for speech difficulty and light-headedness.       Dementia  Psychiatric/Behavioral: Positive for confusion and sleep disturbance. The patient is nervous/anxious.     Immunization History  Administered Date(s) Administered  . Influenza Whole 05/09/2018  . Influenza, High Dose Seasonal PF 05/09/2019  . Influenza-Unspecified 05/28/2017, 05/19/2020  . Moderna Sars-Covid-2 Vaccination 08/09/2019, 09/06/2019, 06/15/2020  . Pneumococcal Conjugate-13 07/13/2017  . Pneumococcal Polysaccharide-23 04/07/2002  . Tdap 07/13/2017, 10/08/2017   Pertinent  Health Maintenance Due  Topic Date Due  . INFLUENZA VACCINE  03/07/2021  . DEXA SCAN  Completed  . PNA vac Low Risk Adult  Completed   Fall Risk  03/10/2019 10/05/2017  Falls in the past year? (No Data) No  Comment Emmi Telephone Survey: data to providers prior to load -  Number falls in past yr: (No Data) -  Comment Emmi Telephone Survey Actual Response =  -   Functional Status Survey:    Vitals:   11/24/20 1036  BP: (!) 141/82  Pulse: (!) 101  Temp: (!) 97.5 F (36.4 C)  SpO2: 94%  Weight: 102 lb 11.2 oz (46.6 kg)  Height: 5\' 4"  (1.626 m)   Body mass index is 17.63 kg/m. Physical Exam Vitals and nursing note reviewed.  Constitutional:      Appearance: Normal appearance.     Comments: frail  HENT:     Head: Normocephalic and atraumatic.     Mouth/Throat:     Mouth: Mucous membranes are moist.  Eyes:     Extraocular Movements: Extraocular movements intact.     Conjunctiva/sclera: Conjunctivae normal.     Pupils: Pupils are equal, round, and reactive  to light.  Cardiovascular:     Rate and Rhythm: Normal rate. Rhythm irregular.     Heart sounds: No murmur heard.   Pulmonary:     Breath sounds: No rales.  Abdominal:     General: Bowel sounds are normal.     Palpations: Abdomen is soft.     Tenderness: There is no abdominal tenderness.  Musculoskeletal:     Cervical back: Normal range of motion and neck supple.     Right lower leg: No edema.     Left lower leg: No edema.  Skin:    General: Skin is warm and dry.  Neurological:     General: No focal deficit present.     Mental Status: She is alert. Mental status is at baseline.     Coordination: Coordination abnormal.     Gait: Gait abnormal.     Comments: Tremor in fingers are worsened. Oriented to self.   Psychiatric:     Comments: Confused.  Labs reviewed: Recent Labs    01/29/20 0000  NA 145  K 4.3  CL 103  CO2 34*  BUN 41*  CREATININE 1.2*  CALCIUM 9.7   Recent Labs    01/29/20 0000  AST 12*  ALT 7  ALKPHOS 44  ALBUMIN 3.6   Recent Labs    01/29/20 0000  WBC 11.9  NEUTROABS 8,342  HGB 12.1  HCT 36  PLT 144*   Lab Results  Component Value Date   TSH 0.76 05/21/2019   No results found for: HGBA1C No results found for: CHOL, HDL, LDLCALC, LDLDIRECT, TRIG, CHOLHDL  Significant Diagnostic Results in last 30 days:  No results found.  Assessment/Plan Adult failure to thrive Continue supportive care, Hospice service, her right has been stable.   Mixed Alzheimer's and vascular dementia (HCC) Advanced stage, no safety awareness, neal total care.   Gait abnormality Worsened gait, frailty, wobbly gait, frequent falls  Tremor, essential Essential tremor,dependent of ADLs, offPropranolol   Depression, recurrent (HCC) Anxiety/depression, able to redirected with activities   Lower back pain Controlled chronic lower back pain, takes Tylenol 1000mg  bid/500mg  q8hrs prn.    Slow transit constipation Stable, continue  Senokot SII qhs, MOM qod.       Family/ staff Communication: plan of care reviewed with the patient and charge nurse.   Labs/tests ordered:  none  Time spend 25 minutes.

## 2020-11-24 NOTE — Assessment & Plan Note (Signed)
Continue supportive care, Hospice service, her right has been stable.

## 2020-11-24 NOTE — Assessment & Plan Note (Signed)
Stable, continue Senokot SII qhs, MOM qod.

## 2020-11-24 NOTE — Assessment & Plan Note (Addendum)
Controlled chronic lower back pain, takes Tylenol 1000mg  bid/500mg  q8hrs prn.

## 2020-11-24 NOTE — Assessment & Plan Note (Addendum)
Anxiety/depression, able to redirected with activities

## 2020-11-24 NOTE — Assessment & Plan Note (Signed)
Essential tremor,dependent of ADLs, offPropranolol

## 2020-11-24 NOTE — Assessment & Plan Note (Signed)
Worsened gait, frailty, wobbly gait, frequent falls

## 2020-11-25 DIAGNOSIS — N183 Chronic kidney disease, stage 3 unspecified: Secondary | ICD-10-CM | POA: Diagnosis not present

## 2020-11-25 DIAGNOSIS — I509 Heart failure, unspecified: Secondary | ICD-10-CM | POA: Diagnosis not present

## 2020-11-25 DIAGNOSIS — I130 Hypertensive heart and chronic kidney disease with heart failure and stage 1 through stage 4 chronic kidney disease, or unspecified chronic kidney disease: Secondary | ICD-10-CM | POA: Diagnosis not present

## 2020-11-25 DIAGNOSIS — R296 Repeated falls: Secondary | ICD-10-CM | POA: Diagnosis not present

## 2020-11-25 DIAGNOSIS — F0280 Dementia in other diseases classified elsewhere without behavioral disturbance: Secondary | ICD-10-CM | POA: Diagnosis not present

## 2020-11-25 DIAGNOSIS — G309 Alzheimer's disease, unspecified: Secondary | ICD-10-CM | POA: Diagnosis not present

## 2020-11-26 ENCOUNTER — Encounter: Payer: Self-pay | Admitting: Nurse Practitioner

## 2020-11-29 DIAGNOSIS — G309 Alzheimer's disease, unspecified: Secondary | ICD-10-CM | POA: Diagnosis not present

## 2020-11-29 DIAGNOSIS — I130 Hypertensive heart and chronic kidney disease with heart failure and stage 1 through stage 4 chronic kidney disease, or unspecified chronic kidney disease: Secondary | ICD-10-CM | POA: Diagnosis not present

## 2020-11-29 DIAGNOSIS — I509 Heart failure, unspecified: Secondary | ICD-10-CM | POA: Diagnosis not present

## 2020-11-29 DIAGNOSIS — N183 Chronic kidney disease, stage 3 unspecified: Secondary | ICD-10-CM | POA: Diagnosis not present

## 2020-11-29 DIAGNOSIS — R296 Repeated falls: Secondary | ICD-10-CM | POA: Diagnosis not present

## 2020-11-29 DIAGNOSIS — F0280 Dementia in other diseases classified elsewhere without behavioral disturbance: Secondary | ICD-10-CM | POA: Diagnosis not present

## 2020-11-30 DIAGNOSIS — G309 Alzheimer's disease, unspecified: Secondary | ICD-10-CM | POA: Diagnosis not present

## 2020-11-30 DIAGNOSIS — R296 Repeated falls: Secondary | ICD-10-CM | POA: Diagnosis not present

## 2020-11-30 DIAGNOSIS — I509 Heart failure, unspecified: Secondary | ICD-10-CM | POA: Diagnosis not present

## 2020-11-30 DIAGNOSIS — N183 Chronic kidney disease, stage 3 unspecified: Secondary | ICD-10-CM | POA: Diagnosis not present

## 2020-11-30 DIAGNOSIS — F0280 Dementia in other diseases classified elsewhere without behavioral disturbance: Secondary | ICD-10-CM | POA: Diagnosis not present

## 2020-11-30 DIAGNOSIS — I130 Hypertensive heart and chronic kidney disease with heart failure and stage 1 through stage 4 chronic kidney disease, or unspecified chronic kidney disease: Secondary | ICD-10-CM | POA: Diagnosis not present

## 2020-12-02 DIAGNOSIS — I130 Hypertensive heart and chronic kidney disease with heart failure and stage 1 through stage 4 chronic kidney disease, or unspecified chronic kidney disease: Secondary | ICD-10-CM | POA: Diagnosis not present

## 2020-12-02 DIAGNOSIS — R296 Repeated falls: Secondary | ICD-10-CM | POA: Diagnosis not present

## 2020-12-02 DIAGNOSIS — F0280 Dementia in other diseases classified elsewhere without behavioral disturbance: Secondary | ICD-10-CM | POA: Diagnosis not present

## 2020-12-02 DIAGNOSIS — N183 Chronic kidney disease, stage 3 unspecified: Secondary | ICD-10-CM | POA: Diagnosis not present

## 2020-12-02 DIAGNOSIS — I509 Heart failure, unspecified: Secondary | ICD-10-CM | POA: Diagnosis not present

## 2020-12-02 DIAGNOSIS — G309 Alzheimer's disease, unspecified: Secondary | ICD-10-CM | POA: Diagnosis not present

## 2020-12-05 DIAGNOSIS — G309 Alzheimer's disease, unspecified: Secondary | ICD-10-CM | POA: Diagnosis not present

## 2020-12-05 DIAGNOSIS — N183 Chronic kidney disease, stage 3 unspecified: Secondary | ICD-10-CM | POA: Diagnosis not present

## 2020-12-05 DIAGNOSIS — G250 Essential tremor: Secondary | ICD-10-CM | POA: Diagnosis not present

## 2020-12-05 DIAGNOSIS — R296 Repeated falls: Secondary | ICD-10-CM | POA: Diagnosis not present

## 2020-12-05 DIAGNOSIS — I509 Heart failure, unspecified: Secondary | ICD-10-CM | POA: Diagnosis not present

## 2020-12-05 DIAGNOSIS — Z87440 Personal history of urinary (tract) infections: Secondary | ICD-10-CM | POA: Diagnosis not present

## 2020-12-05 DIAGNOSIS — Z947 Corneal transplant status: Secondary | ICD-10-CM | POA: Diagnosis not present

## 2020-12-05 DIAGNOSIS — E46 Unspecified protein-calorie malnutrition: Secondary | ICD-10-CM | POA: Diagnosis not present

## 2020-12-05 DIAGNOSIS — D631 Anemia in chronic kidney disease: Secondary | ICD-10-CM | POA: Diagnosis not present

## 2020-12-05 DIAGNOSIS — R32 Unspecified urinary incontinence: Secondary | ICD-10-CM | POA: Diagnosis not present

## 2020-12-05 DIAGNOSIS — Z741 Need for assistance with personal care: Secondary | ICD-10-CM | POA: Diagnosis not present

## 2020-12-05 DIAGNOSIS — K5909 Other constipation: Secondary | ICD-10-CM | POA: Diagnosis not present

## 2020-12-05 DIAGNOSIS — Z681 Body mass index (BMI) 19 or less, adult: Secondary | ICD-10-CM | POA: Diagnosis not present

## 2020-12-05 DIAGNOSIS — I130 Hypertensive heart and chronic kidney disease with heart failure and stage 1 through stage 4 chronic kidney disease, or unspecified chronic kidney disease: Secondary | ICD-10-CM | POA: Diagnosis not present

## 2020-12-05 DIAGNOSIS — Z993 Dependence on wheelchair: Secondary | ICD-10-CM | POA: Diagnosis not present

## 2020-12-05 DIAGNOSIS — S22080D Wedge compression fracture of T11-T12 vertebra, subsequent encounter for fracture with routine healing: Secondary | ICD-10-CM | POA: Diagnosis not present

## 2020-12-05 DIAGNOSIS — R159 Full incontinence of feces: Secondary | ICD-10-CM | POA: Diagnosis not present

## 2020-12-05 DIAGNOSIS — H01003 Unspecified blepharitis right eye, unspecified eyelid: Secondary | ICD-10-CM | POA: Diagnosis not present

## 2020-12-05 DIAGNOSIS — I7300 Raynaud's syndrome without gangrene: Secondary | ICD-10-CM | POA: Diagnosis not present

## 2020-12-05 DIAGNOSIS — F0280 Dementia in other diseases classified elsewhere without behavioral disturbance: Secondary | ICD-10-CM | POA: Diagnosis not present

## 2020-12-05 DIAGNOSIS — I4891 Unspecified atrial fibrillation: Secondary | ICD-10-CM | POA: Diagnosis not present

## 2020-12-05 DIAGNOSIS — F418 Other specified anxiety disorders: Secondary | ICD-10-CM | POA: Diagnosis not present

## 2020-12-06 DIAGNOSIS — R296 Repeated falls: Secondary | ICD-10-CM | POA: Diagnosis not present

## 2020-12-06 DIAGNOSIS — G309 Alzheimer's disease, unspecified: Secondary | ICD-10-CM | POA: Diagnosis not present

## 2020-12-06 DIAGNOSIS — I509 Heart failure, unspecified: Secondary | ICD-10-CM | POA: Diagnosis not present

## 2020-12-06 DIAGNOSIS — I130 Hypertensive heart and chronic kidney disease with heart failure and stage 1 through stage 4 chronic kidney disease, or unspecified chronic kidney disease: Secondary | ICD-10-CM | POA: Diagnosis not present

## 2020-12-06 DIAGNOSIS — N183 Chronic kidney disease, stage 3 unspecified: Secondary | ICD-10-CM | POA: Diagnosis not present

## 2020-12-06 DIAGNOSIS — F0280 Dementia in other diseases classified elsewhere without behavioral disturbance: Secondary | ICD-10-CM | POA: Diagnosis not present

## 2020-12-07 DIAGNOSIS — I509 Heart failure, unspecified: Secondary | ICD-10-CM | POA: Diagnosis not present

## 2020-12-07 DIAGNOSIS — F0280 Dementia in other diseases classified elsewhere without behavioral disturbance: Secondary | ICD-10-CM | POA: Diagnosis not present

## 2020-12-07 DIAGNOSIS — G309 Alzheimer's disease, unspecified: Secondary | ICD-10-CM | POA: Diagnosis not present

## 2020-12-07 DIAGNOSIS — R296 Repeated falls: Secondary | ICD-10-CM | POA: Diagnosis not present

## 2020-12-07 DIAGNOSIS — N183 Chronic kidney disease, stage 3 unspecified: Secondary | ICD-10-CM | POA: Diagnosis not present

## 2020-12-07 DIAGNOSIS — I130 Hypertensive heart and chronic kidney disease with heart failure and stage 1 through stage 4 chronic kidney disease, or unspecified chronic kidney disease: Secondary | ICD-10-CM | POA: Diagnosis not present

## 2020-12-09 DIAGNOSIS — N183 Chronic kidney disease, stage 3 unspecified: Secondary | ICD-10-CM | POA: Diagnosis not present

## 2020-12-09 DIAGNOSIS — F0280 Dementia in other diseases classified elsewhere without behavioral disturbance: Secondary | ICD-10-CM | POA: Diagnosis not present

## 2020-12-09 DIAGNOSIS — G309 Alzheimer's disease, unspecified: Secondary | ICD-10-CM | POA: Diagnosis not present

## 2020-12-09 DIAGNOSIS — R296 Repeated falls: Secondary | ICD-10-CM | POA: Diagnosis not present

## 2020-12-09 DIAGNOSIS — I130 Hypertensive heart and chronic kidney disease with heart failure and stage 1 through stage 4 chronic kidney disease, or unspecified chronic kidney disease: Secondary | ICD-10-CM | POA: Diagnosis not present

## 2020-12-09 DIAGNOSIS — I509 Heart failure, unspecified: Secondary | ICD-10-CM | POA: Diagnosis not present

## 2020-12-10 DIAGNOSIS — I509 Heart failure, unspecified: Secondary | ICD-10-CM | POA: Diagnosis not present

## 2020-12-10 DIAGNOSIS — F0280 Dementia in other diseases classified elsewhere without behavioral disturbance: Secondary | ICD-10-CM | POA: Diagnosis not present

## 2020-12-10 DIAGNOSIS — G309 Alzheimer's disease, unspecified: Secondary | ICD-10-CM | POA: Diagnosis not present

## 2020-12-10 DIAGNOSIS — R296 Repeated falls: Secondary | ICD-10-CM | POA: Diagnosis not present

## 2020-12-10 DIAGNOSIS — N183 Chronic kidney disease, stage 3 unspecified: Secondary | ICD-10-CM | POA: Diagnosis not present

## 2020-12-10 DIAGNOSIS — I130 Hypertensive heart and chronic kidney disease with heart failure and stage 1 through stage 4 chronic kidney disease, or unspecified chronic kidney disease: Secondary | ICD-10-CM | POA: Diagnosis not present

## 2020-12-13 DIAGNOSIS — N183 Chronic kidney disease, stage 3 unspecified: Secondary | ICD-10-CM | POA: Diagnosis not present

## 2020-12-13 DIAGNOSIS — F0280 Dementia in other diseases classified elsewhere without behavioral disturbance: Secondary | ICD-10-CM | POA: Diagnosis not present

## 2020-12-13 DIAGNOSIS — I509 Heart failure, unspecified: Secondary | ICD-10-CM | POA: Diagnosis not present

## 2020-12-13 DIAGNOSIS — I130 Hypertensive heart and chronic kidney disease with heart failure and stage 1 through stage 4 chronic kidney disease, or unspecified chronic kidney disease: Secondary | ICD-10-CM | POA: Diagnosis not present

## 2020-12-13 DIAGNOSIS — R296 Repeated falls: Secondary | ICD-10-CM | POA: Diagnosis not present

## 2020-12-13 DIAGNOSIS — G309 Alzheimer's disease, unspecified: Secondary | ICD-10-CM | POA: Diagnosis not present

## 2020-12-14 DIAGNOSIS — R296 Repeated falls: Secondary | ICD-10-CM | POA: Diagnosis not present

## 2020-12-14 DIAGNOSIS — G309 Alzheimer's disease, unspecified: Secondary | ICD-10-CM | POA: Diagnosis not present

## 2020-12-14 DIAGNOSIS — I130 Hypertensive heart and chronic kidney disease with heart failure and stage 1 through stage 4 chronic kidney disease, or unspecified chronic kidney disease: Secondary | ICD-10-CM | POA: Diagnosis not present

## 2020-12-14 DIAGNOSIS — I509 Heart failure, unspecified: Secondary | ICD-10-CM | POA: Diagnosis not present

## 2020-12-14 DIAGNOSIS — F0280 Dementia in other diseases classified elsewhere without behavioral disturbance: Secondary | ICD-10-CM | POA: Diagnosis not present

## 2020-12-14 DIAGNOSIS — N183 Chronic kidney disease, stage 3 unspecified: Secondary | ICD-10-CM | POA: Diagnosis not present

## 2020-12-15 ENCOUNTER — Encounter: Payer: Self-pay | Admitting: Nurse Practitioner

## 2020-12-15 ENCOUNTER — Non-Acute Institutional Stay (SKILLED_NURSING_FACILITY): Payer: Medicare Other | Admitting: Nurse Practitioner

## 2020-12-15 DIAGNOSIS — F0150 Vascular dementia without behavioral disturbance: Secondary | ICD-10-CM | POA: Diagnosis not present

## 2020-12-15 DIAGNOSIS — G309 Alzheimer's disease, unspecified: Secondary | ICD-10-CM

## 2020-12-15 DIAGNOSIS — F339 Major depressive disorder, recurrent, unspecified: Secondary | ICD-10-CM

## 2020-12-15 DIAGNOSIS — K5901 Slow transit constipation: Secondary | ICD-10-CM

## 2020-12-15 DIAGNOSIS — G250 Essential tremor: Secondary | ICD-10-CM | POA: Diagnosis not present

## 2020-12-15 DIAGNOSIS — R627 Adult failure to thrive: Secondary | ICD-10-CM

## 2020-12-15 DIAGNOSIS — M545 Low back pain, unspecified: Secondary | ICD-10-CM | POA: Diagnosis not present

## 2020-12-15 DIAGNOSIS — R269 Unspecified abnormalities of gait and mobility: Secondary | ICD-10-CM | POA: Diagnosis not present

## 2020-12-15 DIAGNOSIS — F0280 Dementia in other diseases classified elsewhere without behavioral disturbance: Secondary | ICD-10-CM | POA: Diagnosis not present

## 2020-12-15 NOTE — Assessment & Plan Note (Signed)
dependent of ADLs, off  Propranolol 

## 2020-12-15 NOTE — Assessment & Plan Note (Signed)
able to redirected with activities,

## 2020-12-15 NOTE — Assessment & Plan Note (Signed)
Chronic lower back pain, takes Tylenol 1000mg bid/500mg q8hrs prn.   

## 2020-12-15 NOTE — Assessment & Plan Note (Signed)
supportive care, under Hospice service.  

## 2020-12-15 NOTE — Assessment & Plan Note (Signed)
Constipation, takes Senokot SII qhs, MOM qod.

## 2020-12-15 NOTE — Progress Notes (Signed)
Location:   [facility] Room Number: [phone] Place of Service:  SNF (31) Provider:  Lautaro Koral Johnney Ou, NP    Patient Care Team: Mahlon Gammon, MD as PCP - General (Internal Medicine) Nahser, Deloris Ping, MD as PCP - Cardiology (Cardiology) Maxima Skelton X, NP as Nurse Practitioner (Internal Medicine)  Extended Emergency Contact Information Primary Emergency Contact: Morgan,Tom Address: [address]          [place], [place] [address] [place] Home Phone: [phone] Relation: Friend Secondary Emergency Contact: Brandy Hale [place] Home Phone: [phone] Relation: Niece  Code Status:  DNR Goals of care: Advanced Directive information Advanced Directives 12/15/2020  Does Patient Have a Medical Advance Directive? Yes  Type of Estate agent of [place];Out of facility DNR (pink MOST or yellow form)  Does patient want to make changes to medical advance directive? No - Patient declined  Copy of Healthcare Power of Attorney in Chart? Yes - validated most recent copy scanned in chart (See row information)  Would patient like information on creating a medical advance directive? -  Pre-existing out of facility DNR order (yellow form or pink MOST form) Yellow form placed in chart (order not valid for inpatient use)     Chief Complaint  Patient presents with  . Medical Management of Chronic Issues    Routine follow up.    HPI:  Pt is a 87 y.o. female seen today for medical management of chronic diseases.    Advanced dementia, no safety awareness             FTT, supportive care, under Hospice service.  Worsened gait, frailty, wobbly gait, frequent falls, w/c for mobility.  Essential tremor,dependent of ADLs, offPropranolol Anxiety/depression, able to redirected with activities, Chronic lower back pain, takes Tylenol 1000mg  bid/500mg  q8hrs  prn. Constipation, takes Senokot SII qhs, MOM qod.  Past Medical History:  Diagnosis Date  . Anticoagulant long-term use   . Atrial fibrillation (HCC)   . CHF (congestive heart failure) (HCC)   . Chronic anticoagulation   . Hypertension   . Raynaud phenomenon   . Tremor, essential    Past Surgical History:  Procedure Laterality Date  . CHOLECYSTECTOMY    . TONSILLECTOMY      Allergies  Allergen Reactions  . Fosamax [Alendronate]   . Other Nausea And Vomiting    Green pepper    Allergies as of 12/15/2020      Reactions   Fosamax [alendronate]    Other Nausea And Vomiting   Green pepper      Medication List       Accurate as of Dec 15, 2020  3:07 PM. If you have any questions, ask your nurse or doctor.        STOP taking these medications   NON FORMULARY Stopped by: Lucah Petta X Dakiyah Heinke, NP   RESOURCE PO Stopped by: Marylee Belzer X Jerrion Tabbert, NP     TAKE these medications   acetaminophen 500 MG tablet Commonly known as: TYLENOL Take 500 mg by mouth every 8 (eight) hours as needed for mild pain or moderate pain.   acetaminophen 500 MG tablet Commonly known as: TYLENOL Take 1,000 mg by mouth 2 (two) times daily. Not to exceed 3,00 mg daily.   magnesium hydroxide 400 MG/5ML suspension Commonly known as: MILK OF MAGNESIA Take by mouth every other day.   sennosides-docusate sodium 8.6-50 MG tablet Commonly known as: SENOKOT-S Take 2 tablets by  mouth at bedtime.       Review of Systems  Constitutional: Negative for activity change, appetite change, fever and unexpected weight change.  HENT: Positive for hearing loss. Negative for congestion and voice change.   Eyes: Negative for visual disturbance.  Respiratory: Negative for shortness of breath.   Cardiovascular: Negative for leg swelling.  Gastrointestinal: Negative for abdominal pain and constipation.  Genitourinary: Negative for dysuria and urgency.  Musculoskeletal: Positive for arthralgias, back pain and  gait problem.  Skin: Negative for color change.  Neurological: Positive for tremors. Negative for speech difficulty and headaches.       Dementia  Psychiatric/Behavioral: Positive for confusion. Negative for sleep disturbance. The patient is not nervous/anxious.     Immunization History  Administered Date(s) Administered  . Influenza Whole 05/09/2018  . Influenza, High Dose Seasonal PF 05/09/2019  . Influenza-Unspecified 05/28/2017, 05/19/2020  . Moderna Sars-Covid-2 Vaccination 08/09/2019, 09/06/2019, 06/15/2020  . Pneumococcal Conjugate-13 07/13/2017  . Pneumococcal Polysaccharide-23 04/07/2002  . Tdap 07/13/2017, 10/08/2017   Pertinent  Health Maintenance Due  Topic Date Due  . INFLUENZA VACCINE  03/07/2021  . DEXA SCAN  Completed  . PNA vac Low Risk Adult  Completed   Fall Risk  03/10/2019 10/05/2017  Falls in the past year? (No Data) No  Comment Emmi Telephone Survey: data to providers prior to load -  Number falls in past yr: (No Data) -  Comment Emmi Telephone Survey Actual Response =  -   Functional Status Survey:    Vitals:   12/15/20 1428  BP: 130/72  Pulse: 72  Resp: 18  Temp: (!) 97.4 F (36.3 C)  SpO2: 94%  Weight: 101 lb 11.2 oz (46.1 kg)  Height: 5\' 4"  (1.626 m)   Body mass index is 17.46 kg/m. Physical Exam Vitals and nursing note reviewed.  Constitutional:      Appearance: Normal appearance.     Comments: frail  HENT:     Head: Normocephalic and atraumatic.     Mouth/Throat:     Mouth: Mucous membranes are moist.  Eyes:     Extraocular Movements: Extraocular movements intact.     Conjunctiva/sclera: Conjunctivae normal.     Pupils: Pupils are equal, round, and reactive to light.  Cardiovascular:     Rate and Rhythm: Normal rate. Rhythm irregular.     Heart sounds: No murmur heard.   Pulmonary:     Breath sounds: No rales.  Abdominal:     General: Bowel sounds are normal.     Palpations: Abdomen is soft.     Tenderness: There is no  abdominal tenderness.  Musculoskeletal:     Cervical back: Normal range of motion and neck supple.     Right lower leg: No edema.     Left lower leg: No edema.  Skin:    General: Skin is warm and dry.  Neurological:     General: No focal deficit present.     Mental Status: She is alert. Mental status is at baseline.     Coordination: Coordination abnormal.     Gait: Gait abnormal.     Comments: Tremor in fingers are worsened. Oriented to self.   Psychiatric:     Comments: Confused, but smiled when she talked to.      Labs reviewed: Recent Labs    01/29/20 0000  NA 145  K 4.3  CL 103  CO2 34*  BUN 41*  CREATININE 1.2*  CALCIUM 9.7   Recent Labs  01/29/20 0000  AST 12*  ALT 7  ALKPHOS 44  ALBUMIN 3.6   Recent Labs    01/29/20 0000  WBC 11.9  NEUTROABS 8,342  HGB 12.1  HCT 36  PLT 144*   Lab Results  Component Value Date   TSH 0.76 05/21/2019   No results found for: HGBA1C No results found for: CHOL, HDL, LDLCALC, LDLDIRECT, TRIG, CHOLHDL  Significant Diagnostic Results in last 30 days:  No results found.  Assessment/Plan There are no diagnoses linked to this encounter.   Family/ staff Communication:   Labs/tests ordered:

## 2020-12-15 NOTE — Assessment & Plan Note (Signed)
Advanced dementia, no safety awareness  

## 2020-12-15 NOTE — Assessment & Plan Note (Signed)
Worsened gait, frailty, wobbly gait, frequent falls, w/c for mobility.

## 2020-12-20 ENCOUNTER — Encounter: Payer: Self-pay | Admitting: Nurse Practitioner

## 2020-12-20 ENCOUNTER — Non-Acute Institutional Stay (SKILLED_NURSING_FACILITY): Payer: Medicare Other | Admitting: Nurse Practitioner

## 2020-12-20 DIAGNOSIS — M545 Low back pain, unspecified: Secondary | ICD-10-CM

## 2020-12-20 DIAGNOSIS — K5901 Slow transit constipation: Secondary | ICD-10-CM

## 2020-12-20 DIAGNOSIS — F339 Major depressive disorder, recurrent, unspecified: Secondary | ICD-10-CM

## 2020-12-20 DIAGNOSIS — R269 Unspecified abnormalities of gait and mobility: Secondary | ICD-10-CM

## 2020-12-20 DIAGNOSIS — G250 Essential tremor: Secondary | ICD-10-CM | POA: Diagnosis not present

## 2020-12-20 DIAGNOSIS — G309 Alzheimer's disease, unspecified: Secondary | ICD-10-CM

## 2020-12-20 DIAGNOSIS — H0100B Unspecified blepharitis left eye, upper and lower eyelids: Secondary | ICD-10-CM | POA: Diagnosis not present

## 2020-12-20 DIAGNOSIS — I509 Heart failure, unspecified: Secondary | ICD-10-CM | POA: Diagnosis not present

## 2020-12-20 DIAGNOSIS — H0100A Unspecified blepharitis right eye, upper and lower eyelids: Secondary | ICD-10-CM

## 2020-12-20 DIAGNOSIS — I130 Hypertensive heart and chronic kidney disease with heart failure and stage 1 through stage 4 chronic kidney disease, or unspecified chronic kidney disease: Secondary | ICD-10-CM | POA: Diagnosis not present

## 2020-12-20 DIAGNOSIS — R627 Adult failure to thrive: Secondary | ICD-10-CM | POA: Diagnosis not present

## 2020-12-20 DIAGNOSIS — F0280 Dementia in other diseases classified elsewhere without behavioral disturbance: Secondary | ICD-10-CM | POA: Diagnosis not present

## 2020-12-20 DIAGNOSIS — N183 Chronic kidney disease, stage 3 unspecified: Secondary | ICD-10-CM | POA: Diagnosis not present

## 2020-12-20 DIAGNOSIS — F0150 Vascular dementia without behavioral disturbance: Secondary | ICD-10-CM | POA: Diagnosis not present

## 2020-12-20 DIAGNOSIS — R296 Repeated falls: Secondary | ICD-10-CM | POA: Diagnosis not present

## 2020-12-20 NOTE — Assessment & Plan Note (Signed)
frailty, wobbly gait, frequent falls, w/c for mobility.

## 2020-12-20 NOTE — Assessment & Plan Note (Signed)
able to be redirected with activities, 

## 2020-12-20 NOTE — Progress Notes (Signed)
Location:   S[facility] Room Number: N031 Place of Service:  SNF (31) Provider: Arna Snipe Karmella Bouvier NP  Mahlon Gammon, MD  Patient Care Team: Mahlon Gammon, MD as PCP - General (Internal Medicine) Nahser, Deloris Ping, MD as PCP - Cardiology (Cardiology) Mikaylee Arseneau X, NP as Nurse Practitioner (Internal Medicine)  Extended Emergency Contact Information Primary Emergency Contact: Morgan,Tom Address: [address]          [place], [place] [address] [place] Home Phone: [phone] Relation: Friend Secondary Emergency Contact: Brandy Hale [place] of [place] Home Phone: [phone] Relation: Niece  Code Status: DNR Goals of care: Advanced Directive information Advanced Directives 12/20/2020  Does Patient Have a Medical Advance Directive? -  Type of Estate agent of [place];Out of facility DNR (pink MOST or yellow form)  Does patient want to make changes to medical advance directive? -  Copy of Healthcare Power of Attorney in Chart? Yes - validated most recent copy scanned in chart (See row information)  Would patient like information on creating a medical advance directive? -  Pre-existing out of facility DNR order (yellow form or pink MOST form) Yellow form placed in chart (order not valid for inpatient use);Pink MOST form placed in chart (order not valid for inpatient use)     Chief Complaint  Patient presents with  . Acute Visit    Patient complains of crusted eyelashes.     HPI:  Pt is a 87 y.o. female seen today for an acute visit for crusted eyelashes, not injected R+L eyes, denied change of vision, pain or itching in eyes.   Advanced dementia, no safety awareness FTT, supportive care, under Hospice service. Worsened gait, frailty, wobbly gait, frequent falls, w/c for mobility.  Essential tremor,dependent of ADLs, offPropranolol Anxiety/depression, able to be  redirected with activities, Chronic lower back pain, takes Tylenol 1000mg  bid/500mg  q8hrs prn. Constipation, takes Senokot SII qhs, MOM qod.   Past Medical History:  Diagnosis Date  . Anticoagulant long-term use   . Atrial fibrillation (HCC)   . CHF (congestive heart failure) (HCC)   . Chronic anticoagulation   . Hypertension   . Raynaud phenomenon   . Tremor, essential    Past Surgical History:  Procedure Laterality Date  . CHOLECYSTECTOMY    . TONSILLECTOMY      Allergies  Allergen Reactions  . Fosamax [Alendronate]   . Other Nausea And Vomiting    Green pepper    Allergies as of 12/20/2020      Reactions   Fosamax [alendronate]    Other Nausea And Vomiting   Green pepper      Medication List       Accurate as of Dec 20, 2020  4:30 PM. If you have any questions, ask your nurse or doctor.        acetaminophen 500 MG tablet Commonly known as: TYLENOL Take 500 mg by mouth every 8 (eight) hours as needed for mild pain or moderate pain.   acetaminophen 500 MG tablet Commonly known as: TYLENOL Take 1,000 mg by mouth 2 (two) times daily. Not to exceed 3,00 mg daily.   erythromycin ophthalmic ointment Place 1 application into both eyes at bedtime.   magnesium hydroxide 400 MG/5ML suspension Commonly known as: MILK OF MAGNESIA Take by mouth every other day.   sennosides-docusate sodium 8.6-50 MG tablet Commonly known as: SENOKOT-S Take 2 tablets by mouth at bedtime.       Review of  Systems  Constitutional: Negative for appetite change, fatigue and fever.  HENT: Positive for hearing loss. Negative for congestion and voice change.   Eyes: Positive for discharge. Negative for photophobia, pain, redness, itching and visual disturbance.       Crusted eyelashes.   Respiratory: Negative for shortness of breath.   Cardiovascular: Negative for leg swelling.  Gastrointestinal: Negative for abdominal pain and constipation.   Genitourinary: Negative for dysuria and urgency.  Musculoskeletal: Positive for arthralgias, back pain and gait problem.  Skin: Negative for color change.  Neurological: Positive for tremors. Negative for speech difficulty.       Dementia  Psychiatric/Behavioral: Positive for confusion. Negative for sleep disturbance. The patient is not nervous/anxious.     Immunization History  Administered Date(s) Administered  . Influenza Whole 05/09/2018  . Influenza, High Dose Seasonal PF 05/09/2019  . Influenza-Unspecified 05/28/2017, 05/19/2020  . Moderna Sars-Covid-2 Vaccination 08/09/2019, 09/06/2019, 06/15/2020  . Pneumococcal Conjugate-13 07/13/2017  . Pneumococcal Polysaccharide-23 04/07/2002  . Tdap 07/13/2017, 10/08/2017   Pertinent  Health Maintenance Due  Topic Date Due  . INFLUENZA VACCINE  03/07/2021  . DEXA SCAN  Completed  . PNA vac Low Risk Adult  Completed   Fall Risk  03/10/2019 10/05/2017  Falls in the past year? (No Data) No  Comment Emmi Telephone Survey: data to providers prior to load -  Number falls in past yr: (No Data) -  Comment Emmi Telephone Survey Actual Response =  -   Functional Status Survey:    Vitals:   12/20/20 1521  BP: 130/72  Pulse: 72  Resp: 18  Temp: 97.8 F (36.6 C)  SpO2: 96%  Weight: 101 lb 11.2 oz (46.1 kg)  Height: 5\' 4"  (1.626 m)   Body mass index is 17.46 kg/m. Physical Exam Vitals and nursing note reviewed.  Constitutional:      Appearance: Normal appearance.     Comments: frail  HENT:     Head: Normocephalic and atraumatic.     Mouth/Throat:     Mouth: Mucous membranes are moist.  Eyes:     General:        Right eye: Discharge present.        Left eye: Discharge present.    Extraocular Movements: Extraocular movements intact.     Conjunctiva/sclera: Conjunctivae normal.     Pupils: Pupils are equal, round, and reactive to light.     Comments: Crusted eyelashes  Cardiovascular:     Rate and Rhythm: Normal rate. Rhythm  irregular.     Heart sounds: No murmur heard.   Pulmonary:     Breath sounds: No rales.  Abdominal:     Palpations: Abdomen is soft.     Tenderness: There is no abdominal tenderness.  Musculoskeletal:     Cervical back: Normal range of motion and neck supple.     Right lower leg: No edema.     Left lower leg: No edema.  Skin:    General: Skin is warm and dry.  Neurological:     General: No focal deficit present.     Mental Status: She is alert. Mental status is at baseline.     Coordination: Coordination abnormal.     Gait: Gait abnormal.     Comments: Tremor in fingers are worsened. Oriented to self.   Psychiatric:     Comments: Confused, but smiled when she talked to.      Labs reviewed: Recent Labs    01/29/20 0000  NA 145  K  4.3  CL 103  CO2 34*  BUN 41*  CREATININE 1.2*  CALCIUM 9.7   Recent Labs    01/29/20 0000  AST 12*  ALT 7  ALKPHOS 44  ALBUMIN 3.6   Recent Labs    01/29/20 0000  WBC 11.9  NEUTROABS 8,342  HGB 12.1  HCT 36  PLT 144*   Lab Results  Component Value Date   TSH 0.76 05/21/2019   No results found for: HGBA1C No results found for: CHOL, HDL, LDLCALC, LDLDIRECT, TRIG, CHOLHDL  Significant Diagnostic Results in last 30 days:  No results found.  Assessment/Plan: Blepharitis of both eyes Apply 0.5% Erythromycin ophthalmic oint 1cm ribbon to the R+L subconjunctival sac nightly x 6 weeks.   Slow transit constipation Stable,  takes Senokot SII qhs, MOM qod.   Lower back pain Chronic lower back pain, takes Tylenol 1000mg  bid/500mg  q8hrs prn.   Depression, recurrent (HCC) able to be redirected with activities,   Tremor, essential Mild, dependent of ADLs, offPropranolol   Gait abnormality frailty, wobbly gait, frequent falls, w/c for mobility.    Adult failure to thrive supportive care, under Hospice service.   Mixed Alzheimer's and vascular dementia (HCC) no safety awareness    Family/ staff  Communication: plan of care reviewed with the patient and charge nurse   Labs/tests ordered:  None  Time spend 25 minutes.

## 2020-12-20 NOTE — Assessment & Plan Note (Signed)
Chronic lower back pain, takes Tylenol 1000mg bid/500mg q8hrs prn.   

## 2020-12-20 NOTE — Assessment & Plan Note (Signed)
no safety awareness

## 2020-12-20 NOTE — Assessment & Plan Note (Signed)
Apply 0.5% Erythromycin ophthalmic oint 1cm ribbon to the R+L subconjunctival sac nightly x 6 weeks.

## 2020-12-20 NOTE — Assessment & Plan Note (Signed)
Stable, takes Senokot S II qhs, MOM qod.  

## 2020-12-20 NOTE — Assessment & Plan Note (Signed)
Mild, dependent of ADLs, offPropranolol

## 2020-12-20 NOTE — Assessment & Plan Note (Signed)
supportive care, under Hospice service.  

## 2020-12-21 DIAGNOSIS — R296 Repeated falls: Secondary | ICD-10-CM | POA: Diagnosis not present

## 2020-12-21 DIAGNOSIS — N183 Chronic kidney disease, stage 3 unspecified: Secondary | ICD-10-CM | POA: Diagnosis not present

## 2020-12-21 DIAGNOSIS — I509 Heart failure, unspecified: Secondary | ICD-10-CM | POA: Diagnosis not present

## 2020-12-21 DIAGNOSIS — I130 Hypertensive heart and chronic kidney disease with heart failure and stage 1 through stage 4 chronic kidney disease, or unspecified chronic kidney disease: Secondary | ICD-10-CM | POA: Diagnosis not present

## 2020-12-21 DIAGNOSIS — F0280 Dementia in other diseases classified elsewhere without behavioral disturbance: Secondary | ICD-10-CM | POA: Diagnosis not present

## 2020-12-21 DIAGNOSIS — G309 Alzheimer's disease, unspecified: Secondary | ICD-10-CM | POA: Diagnosis not present

## 2020-12-23 DIAGNOSIS — I509 Heart failure, unspecified: Secondary | ICD-10-CM | POA: Diagnosis not present

## 2020-12-23 DIAGNOSIS — N183 Chronic kidney disease, stage 3 unspecified: Secondary | ICD-10-CM | POA: Diagnosis not present

## 2020-12-23 DIAGNOSIS — F0280 Dementia in other diseases classified elsewhere without behavioral disturbance: Secondary | ICD-10-CM | POA: Diagnosis not present

## 2020-12-23 DIAGNOSIS — G309 Alzheimer's disease, unspecified: Secondary | ICD-10-CM | POA: Diagnosis not present

## 2020-12-23 DIAGNOSIS — I130 Hypertensive heart and chronic kidney disease with heart failure and stage 1 through stage 4 chronic kidney disease, or unspecified chronic kidney disease: Secondary | ICD-10-CM | POA: Diagnosis not present

## 2020-12-23 DIAGNOSIS — R296 Repeated falls: Secondary | ICD-10-CM | POA: Diagnosis not present

## 2020-12-28 DIAGNOSIS — G309 Alzheimer's disease, unspecified: Secondary | ICD-10-CM | POA: Diagnosis not present

## 2020-12-28 DIAGNOSIS — I130 Hypertensive heart and chronic kidney disease with heart failure and stage 1 through stage 4 chronic kidney disease, or unspecified chronic kidney disease: Secondary | ICD-10-CM | POA: Diagnosis not present

## 2020-12-28 DIAGNOSIS — R296 Repeated falls: Secondary | ICD-10-CM | POA: Diagnosis not present

## 2020-12-28 DIAGNOSIS — I509 Heart failure, unspecified: Secondary | ICD-10-CM | POA: Diagnosis not present

## 2020-12-28 DIAGNOSIS — F0280 Dementia in other diseases classified elsewhere without behavioral disturbance: Secondary | ICD-10-CM | POA: Diagnosis not present

## 2020-12-28 DIAGNOSIS — N183 Chronic kidney disease, stage 3 unspecified: Secondary | ICD-10-CM | POA: Diagnosis not present

## 2020-12-30 DIAGNOSIS — N183 Chronic kidney disease, stage 3 unspecified: Secondary | ICD-10-CM | POA: Diagnosis not present

## 2020-12-30 DIAGNOSIS — G309 Alzheimer's disease, unspecified: Secondary | ICD-10-CM | POA: Diagnosis not present

## 2020-12-30 DIAGNOSIS — F0280 Dementia in other diseases classified elsewhere without behavioral disturbance: Secondary | ICD-10-CM | POA: Diagnosis not present

## 2020-12-30 DIAGNOSIS — I509 Heart failure, unspecified: Secondary | ICD-10-CM | POA: Diagnosis not present

## 2020-12-30 DIAGNOSIS — I130 Hypertensive heart and chronic kidney disease with heart failure and stage 1 through stage 4 chronic kidney disease, or unspecified chronic kidney disease: Secondary | ICD-10-CM | POA: Diagnosis not present

## 2020-12-30 DIAGNOSIS — R296 Repeated falls: Secondary | ICD-10-CM | POA: Diagnosis not present

## 2021-01-04 DIAGNOSIS — G309 Alzheimer's disease, unspecified: Secondary | ICD-10-CM | POA: Diagnosis not present

## 2021-01-04 DIAGNOSIS — I130 Hypertensive heart and chronic kidney disease with heart failure and stage 1 through stage 4 chronic kidney disease, or unspecified chronic kidney disease: Secondary | ICD-10-CM | POA: Diagnosis not present

## 2021-01-04 DIAGNOSIS — R296 Repeated falls: Secondary | ICD-10-CM | POA: Diagnosis not present

## 2021-01-04 DIAGNOSIS — F0280 Dementia in other diseases classified elsewhere without behavioral disturbance: Secondary | ICD-10-CM | POA: Diagnosis not present

## 2021-01-04 DIAGNOSIS — I509 Heart failure, unspecified: Secondary | ICD-10-CM | POA: Diagnosis not present

## 2021-01-04 DIAGNOSIS — Z23 Encounter for immunization: Secondary | ICD-10-CM | POA: Diagnosis not present

## 2021-01-04 DIAGNOSIS — N183 Chronic kidney disease, stage 3 unspecified: Secondary | ICD-10-CM | POA: Diagnosis not present

## 2021-01-05 DIAGNOSIS — D631 Anemia in chronic kidney disease: Secondary | ICD-10-CM | POA: Diagnosis not present

## 2021-01-05 DIAGNOSIS — S22080D Wedge compression fracture of T11-T12 vertebra, subsequent encounter for fracture with routine healing: Secondary | ICD-10-CM | POA: Diagnosis not present

## 2021-01-05 DIAGNOSIS — I4891 Unspecified atrial fibrillation: Secondary | ICD-10-CM | POA: Diagnosis not present

## 2021-01-05 DIAGNOSIS — F418 Other specified anxiety disorders: Secondary | ICD-10-CM | POA: Diagnosis not present

## 2021-01-05 DIAGNOSIS — K5909 Other constipation: Secondary | ICD-10-CM | POA: Diagnosis not present

## 2021-01-05 DIAGNOSIS — R32 Unspecified urinary incontinence: Secondary | ICD-10-CM | POA: Diagnosis not present

## 2021-01-05 DIAGNOSIS — N183 Chronic kidney disease, stage 3 unspecified: Secondary | ICD-10-CM | POA: Diagnosis not present

## 2021-01-05 DIAGNOSIS — Z741 Need for assistance with personal care: Secondary | ICD-10-CM | POA: Diagnosis not present

## 2021-01-05 DIAGNOSIS — Z947 Corneal transplant status: Secondary | ICD-10-CM | POA: Diagnosis not present

## 2021-01-05 DIAGNOSIS — R296 Repeated falls: Secondary | ICD-10-CM | POA: Diagnosis not present

## 2021-01-05 DIAGNOSIS — Z993 Dependence on wheelchair: Secondary | ICD-10-CM | POA: Diagnosis not present

## 2021-01-05 DIAGNOSIS — I509 Heart failure, unspecified: Secondary | ICD-10-CM | POA: Diagnosis not present

## 2021-01-05 DIAGNOSIS — Z681 Body mass index (BMI) 19 or less, adult: Secondary | ICD-10-CM | POA: Diagnosis not present

## 2021-01-05 DIAGNOSIS — G250 Essential tremor: Secondary | ICD-10-CM | POA: Diagnosis not present

## 2021-01-05 DIAGNOSIS — E46 Unspecified protein-calorie malnutrition: Secondary | ICD-10-CM | POA: Diagnosis not present

## 2021-01-05 DIAGNOSIS — H01003 Unspecified blepharitis right eye, unspecified eyelid: Secondary | ICD-10-CM | POA: Diagnosis not present

## 2021-01-05 DIAGNOSIS — F0280 Dementia in other diseases classified elsewhere without behavioral disturbance: Secondary | ICD-10-CM | POA: Diagnosis not present

## 2021-01-05 DIAGNOSIS — I7300 Raynaud's syndrome without gangrene: Secondary | ICD-10-CM | POA: Diagnosis not present

## 2021-01-05 DIAGNOSIS — G309 Alzheimer's disease, unspecified: Secondary | ICD-10-CM | POA: Diagnosis not present

## 2021-01-05 DIAGNOSIS — I130 Hypertensive heart and chronic kidney disease with heart failure and stage 1 through stage 4 chronic kidney disease, or unspecified chronic kidney disease: Secondary | ICD-10-CM | POA: Diagnosis not present

## 2021-01-05 DIAGNOSIS — R159 Full incontinence of feces: Secondary | ICD-10-CM | POA: Diagnosis not present

## 2021-01-05 DIAGNOSIS — Z87440 Personal history of urinary (tract) infections: Secondary | ICD-10-CM | POA: Diagnosis not present

## 2021-01-06 ENCOUNTER — Encounter: Payer: Self-pay | Admitting: Internal Medicine

## 2021-01-06 ENCOUNTER — Non-Acute Institutional Stay (SKILLED_NURSING_FACILITY): Admitting: Internal Medicine

## 2021-01-06 DIAGNOSIS — R269 Unspecified abnormalities of gait and mobility: Secondary | ICD-10-CM | POA: Diagnosis not present

## 2021-01-06 DIAGNOSIS — F0150 Vascular dementia without behavioral disturbance: Secondary | ICD-10-CM

## 2021-01-06 DIAGNOSIS — G309 Alzheimer's disease, unspecified: Secondary | ICD-10-CM | POA: Diagnosis not present

## 2021-01-06 DIAGNOSIS — M545 Low back pain, unspecified: Secondary | ICD-10-CM | POA: Diagnosis not present

## 2021-01-06 DIAGNOSIS — G250 Essential tremor: Secondary | ICD-10-CM

## 2021-01-06 DIAGNOSIS — I130 Hypertensive heart and chronic kidney disease with heart failure and stage 1 through stage 4 chronic kidney disease, or unspecified chronic kidney disease: Secondary | ICD-10-CM | POA: Diagnosis not present

## 2021-01-06 DIAGNOSIS — I482 Chronic atrial fibrillation, unspecified: Secondary | ICD-10-CM

## 2021-01-06 DIAGNOSIS — N183 Chronic kidney disease, stage 3 unspecified: Secondary | ICD-10-CM | POA: Diagnosis not present

## 2021-01-06 DIAGNOSIS — R627 Adult failure to thrive: Secondary | ICD-10-CM | POA: Diagnosis not present

## 2021-01-06 DIAGNOSIS — H0100B Unspecified blepharitis left eye, upper and lower eyelids: Secondary | ICD-10-CM

## 2021-01-06 DIAGNOSIS — K5901 Slow transit constipation: Secondary | ICD-10-CM

## 2021-01-06 DIAGNOSIS — H0100A Unspecified blepharitis right eye, upper and lower eyelids: Secondary | ICD-10-CM

## 2021-01-06 DIAGNOSIS — I509 Heart failure, unspecified: Secondary | ICD-10-CM | POA: Diagnosis not present

## 2021-01-06 DIAGNOSIS — F0280 Dementia in other diseases classified elsewhere without behavioral disturbance: Secondary | ICD-10-CM

## 2021-01-06 DIAGNOSIS — R296 Repeated falls: Secondary | ICD-10-CM | POA: Diagnosis not present

## 2021-01-06 NOTE — Progress Notes (Signed)
Location:   [facility] Room Number: 31 Place of Service:  SNF [phone]) Provider:  Einar Crow MD  Mahlon Gammon, MD  Patient Care Team: Mahlon Gammon, MD as PCP - General (Internal Medicine) Nahser, Deloris Ping, MD as PCP - Cardiology (Cardiology) Mast, Man X, NP as Nurse Practitioner (Internal Medicine)  Extended Emergency Contact Information Primary Emergency Contact: Morgan,Tom Address: [address]          [place], [place] [address] [place] of [place] Home Phone: [phone] Relation: Friend Secondary Emergency Contact: Brandy Hale [place] of [place] Home Phone: [phone] Relation: Niece  Code Status:  DNR Hospice Goals of care: Advanced Directive information Advanced Directives 01/06/2021  Does Patient Have a Medical Advance Directive? Yes  Type of Estate agent of [place];Out of facility DNR (pink MOST or yellow form)  Does patient want to make changes to medical advance directive? No - Patient declined  Copy of Healthcare Power of Attorney in Chart? Yes - validated most recent copy scanned in chart (See row information)  Would patient like information on creating a medical advance directive? -  Pre-existing out of facility DNR order (yellow form or pink MOST form) Yellow form placed in chart (order not valid for inpatient use)     Chief Complaint  Patient presents with  . Medical Management of Chronic Issues  . Health Maintenance    Shingrix    HPI:  Pt is a 87 y.o. female seen today for medical management of chronic diseases.    Patient has h/o Atrial Fibrillation,LE edema, Hypertension, Dysphagia,Essential Tremor, Anemia, Dementia,and depression, FallsWeight loss Patient is a long-term resident  Has lost some weight  Not tolerated Remeron Stays High risk for falls No New nursing issues Enrolled in hospice  Past Medical History:  Diagnosis Date  . Anticoagulant long-term use   .  Atrial fibrillation (HCC)   . CHF (congestive heart failure) (HCC)   . Chronic anticoagulation   . Hypertension   . Raynaud phenomenon   . Tremor, essential    Past Surgical History:  Procedure Laterality Date  . CHOLECYSTECTOMY    . TONSILLECTOMY      Allergies  Allergen Reactions  . Fosamax [Alendronate]   . Other Nausea And Vomiting    Green pepper    Allergies as of 01/06/2021      Reactions   Fosamax [alendronate]    Other Nausea And Vomiting   Green pepper      Medication List       Accurate as of January 06, 2021  3:11 PM. If you have any questions, ask your nurse or doctor.        acetaminophen 500 MG tablet Commonly known as: TYLENOL Take 500 mg by mouth every 8 (eight) hours as needed for mild pain or moderate pain.   acetaminophen 500 MG tablet Commonly known as: TYLENOL Take 1,000 mg by mouth 2 (two) times daily. Not to exceed 3,00 mg daily.   erythromycin ophthalmic ointment Place 1 application into both eyes at bedtime.   magnesium hydroxide 400 MG/5ML suspension Commonly known as: MILK OF MAGNESIA Take by mouth every other day.   morphine 5 MG/ML injection Inject into the vein. 100 mg/5 mL (20 mg/mL); amt: 5 mg = 0.25 mL; oral Special Instructions: PRN pain/dyspnea/restlessness Every 4 Hours - PRN   sennosides-docusate sodium 8.6-50 MG tablet Commonly known as: SENOKOT-S Take 2 tablets by mouth at bedtime.  Review of Systems  Unable to perform ROS: Dementia    Immunization History  Administered Date(s) Administered  . Influenza Whole 05/09/2018  . Influenza, High Dose Seasonal PF 05/09/2019  . Influenza-Unspecified 05/28/2017, 05/19/2020  . Moderna Sars-Covid-2 Vaccination 08/09/2019, 09/06/2019, 06/15/2020  . Pneumococcal Conjugate-13 07/13/2017  . Pneumococcal Polysaccharide-23 04/07/2002  . Tdap 07/13/2017, 10/08/2017   Pertinent  Health Maintenance Due  Topic Date Due  . INFLUENZA VACCINE  03/07/2021  . DEXA SCAN   Completed  . PNA vac Low Risk Adult  Completed   Fall Risk  03/10/2019 10/05/2017  Falls in the past year? (No Data) No  Comment Emmi Telephone Survey: data to providers prior to load -  Number falls in past yr: (No Data) -  Comment Emmi Telephone Survey Actual Response =  -   Functional Status Survey:    Vitals:   01/06/21 1458  BP: (!) 155/92  Pulse: 99  Temp: 97.7 F (36.5 C)  SpO2: 95%  Weight: 96 lb 3.2 oz (43.6 kg)  Height: 5\' 4"  (1.626 m)   Body mass index is 16.51 kg/m. Physical Exam Vitals reviewed.  Constitutional:      Comments: Very Frail  HENT:     Head: Normocephalic.     Nose: Nose normal.     Mouth/Throat:     Mouth: Mucous membranes are moist.     Pharynx: Oropharynx is clear.  Eyes:     Pupils: Pupils are equal, round, and reactive to light.     Comments: Blepharitis better  Cardiovascular:     Rate and Rhythm: Normal rate. Rhythm irregular.     Pulses: Normal pulses.  Pulmonary:     Effort: Pulmonary effort is normal.     Breath sounds: Normal breath sounds.  Abdominal:     General: Abdomen is flat. Bowel sounds are normal.     Palpations: Abdomen is soft.  Musculoskeletal:        General: No swelling.     Cervical back: Neck supple.  Skin:    General: Skin is warm and dry.  Neurological:     General: No focal deficit present.     Mental Status: She is alert.     Comments: Tremors Present  Psychiatric:        Mood and Affect: Mood normal.        Thought Content: Thought content normal.     Labs reviewed: Recent Labs    01/29/20 0000  NA 145  K 4.3  CL 103  CO2 34*  BUN 41*  CREATININE 1.2*  CALCIUM 9.7   Recent Labs    01/29/20 0000  AST 12*  ALT 7  ALKPHOS 44  ALBUMIN 3.6   Recent Labs    01/29/20 0000  WBC 11.9  NEUTROABS 8,342  HGB 12.1  HCT 36  PLT 144*   Lab Results  Component Value Date   TSH 0.76 05/21/2019   No results found for: HGBA1C No results found for: CHOL, HDL, LDLCALC, LDLDIRECT, TRIG,  CHOLHDL  Significant Diagnostic Results in last 30 days:  No results found.  Assessment/Plan Mixed Alzheimer's and vascular dementia (HCC) Enrolled in Hospice Able to feed herself but dependent for her ADLS  Adult failure to thrive Has not tolerated  Remeron Bilateral low back pain  Tylenol PRN Gait abnormality Continues to stay Fall risk Chronic a-fib (HCC) Not on any Coagulation due to Falls Tremor, essential Was taken off Propanolol due to Low BP  Slow transit constipation On  MOM Blepharitis of upper and lower eyelids of both eyes, unspecified type Continue Erythtomycin    Family/ staff Communication:   Labs/tests ordered:

## 2021-01-07 DIAGNOSIS — N183 Chronic kidney disease, stage 3 unspecified: Secondary | ICD-10-CM | POA: Diagnosis not present

## 2021-01-07 DIAGNOSIS — G309 Alzheimer's disease, unspecified: Secondary | ICD-10-CM | POA: Diagnosis not present

## 2021-01-07 DIAGNOSIS — I130 Hypertensive heart and chronic kidney disease with heart failure and stage 1 through stage 4 chronic kidney disease, or unspecified chronic kidney disease: Secondary | ICD-10-CM | POA: Diagnosis not present

## 2021-01-07 DIAGNOSIS — R296 Repeated falls: Secondary | ICD-10-CM | POA: Diagnosis not present

## 2021-01-07 DIAGNOSIS — I509 Heart failure, unspecified: Secondary | ICD-10-CM | POA: Diagnosis not present

## 2021-01-07 DIAGNOSIS — F0280 Dementia in other diseases classified elsewhere without behavioral disturbance: Secondary | ICD-10-CM | POA: Diagnosis not present

## 2021-01-11 DIAGNOSIS — I509 Heart failure, unspecified: Secondary | ICD-10-CM | POA: Diagnosis not present

## 2021-01-11 DIAGNOSIS — N183 Chronic kidney disease, stage 3 unspecified: Secondary | ICD-10-CM | POA: Diagnosis not present

## 2021-01-11 DIAGNOSIS — F0280 Dementia in other diseases classified elsewhere without behavioral disturbance: Secondary | ICD-10-CM | POA: Diagnosis not present

## 2021-01-11 DIAGNOSIS — I130 Hypertensive heart and chronic kidney disease with heart failure and stage 1 through stage 4 chronic kidney disease, or unspecified chronic kidney disease: Secondary | ICD-10-CM | POA: Diagnosis not present

## 2021-01-11 DIAGNOSIS — R296 Repeated falls: Secondary | ICD-10-CM | POA: Diagnosis not present

## 2021-01-11 DIAGNOSIS — G309 Alzheimer's disease, unspecified: Secondary | ICD-10-CM | POA: Diagnosis not present

## 2021-01-12 DIAGNOSIS — R296 Repeated falls: Secondary | ICD-10-CM | POA: Diagnosis not present

## 2021-01-12 DIAGNOSIS — I509 Heart failure, unspecified: Secondary | ICD-10-CM | POA: Diagnosis not present

## 2021-01-12 DIAGNOSIS — I130 Hypertensive heart and chronic kidney disease with heart failure and stage 1 through stage 4 chronic kidney disease, or unspecified chronic kidney disease: Secondary | ICD-10-CM | POA: Diagnosis not present

## 2021-01-12 DIAGNOSIS — N183 Chronic kidney disease, stage 3 unspecified: Secondary | ICD-10-CM | POA: Diagnosis not present

## 2021-01-12 DIAGNOSIS — G309 Alzheimer's disease, unspecified: Secondary | ICD-10-CM | POA: Diagnosis not present

## 2021-01-12 DIAGNOSIS — F0280 Dementia in other diseases classified elsewhere without behavioral disturbance: Secondary | ICD-10-CM | POA: Diagnosis not present

## 2021-01-13 DIAGNOSIS — G309 Alzheimer's disease, unspecified: Secondary | ICD-10-CM | POA: Diagnosis not present

## 2021-01-13 DIAGNOSIS — R296 Repeated falls: Secondary | ICD-10-CM | POA: Diagnosis not present

## 2021-01-13 DIAGNOSIS — N183 Chronic kidney disease, stage 3 unspecified: Secondary | ICD-10-CM | POA: Diagnosis not present

## 2021-01-13 DIAGNOSIS — F0280 Dementia in other diseases classified elsewhere without behavioral disturbance: Secondary | ICD-10-CM | POA: Diagnosis not present

## 2021-01-13 DIAGNOSIS — I509 Heart failure, unspecified: Secondary | ICD-10-CM | POA: Diagnosis not present

## 2021-01-13 DIAGNOSIS — I130 Hypertensive heart and chronic kidney disease with heart failure and stage 1 through stage 4 chronic kidney disease, or unspecified chronic kidney disease: Secondary | ICD-10-CM | POA: Diagnosis not present

## 2021-01-20 DIAGNOSIS — I509 Heart failure, unspecified: Secondary | ICD-10-CM | POA: Diagnosis not present

## 2021-01-20 DIAGNOSIS — F0280 Dementia in other diseases classified elsewhere without behavioral disturbance: Secondary | ICD-10-CM | POA: Diagnosis not present

## 2021-01-20 DIAGNOSIS — I130 Hypertensive heart and chronic kidney disease with heart failure and stage 1 through stage 4 chronic kidney disease, or unspecified chronic kidney disease: Secondary | ICD-10-CM | POA: Diagnosis not present

## 2021-01-20 DIAGNOSIS — G309 Alzheimer's disease, unspecified: Secondary | ICD-10-CM | POA: Diagnosis not present

## 2021-01-20 DIAGNOSIS — R296 Repeated falls: Secondary | ICD-10-CM | POA: Diagnosis not present

## 2021-01-20 DIAGNOSIS — N183 Chronic kidney disease, stage 3 unspecified: Secondary | ICD-10-CM | POA: Diagnosis not present

## 2021-01-21 DIAGNOSIS — I509 Heart failure, unspecified: Secondary | ICD-10-CM | POA: Diagnosis not present

## 2021-01-21 DIAGNOSIS — G309 Alzheimer's disease, unspecified: Secondary | ICD-10-CM | POA: Diagnosis not present

## 2021-01-21 DIAGNOSIS — F0280 Dementia in other diseases classified elsewhere without behavioral disturbance: Secondary | ICD-10-CM | POA: Diagnosis not present

## 2021-01-21 DIAGNOSIS — N183 Chronic kidney disease, stage 3 unspecified: Secondary | ICD-10-CM | POA: Diagnosis not present

## 2021-01-21 DIAGNOSIS — I130 Hypertensive heart and chronic kidney disease with heart failure and stage 1 through stage 4 chronic kidney disease, or unspecified chronic kidney disease: Secondary | ICD-10-CM | POA: Diagnosis not present

## 2021-01-21 DIAGNOSIS — R296 Repeated falls: Secondary | ICD-10-CM | POA: Diagnosis not present

## 2021-01-25 DIAGNOSIS — I130 Hypertensive heart and chronic kidney disease with heart failure and stage 1 through stage 4 chronic kidney disease, or unspecified chronic kidney disease: Secondary | ICD-10-CM | POA: Diagnosis not present

## 2021-01-25 DIAGNOSIS — R296 Repeated falls: Secondary | ICD-10-CM | POA: Diagnosis not present

## 2021-01-25 DIAGNOSIS — N183 Chronic kidney disease, stage 3 unspecified: Secondary | ICD-10-CM | POA: Diagnosis not present

## 2021-01-25 DIAGNOSIS — G309 Alzheimer's disease, unspecified: Secondary | ICD-10-CM | POA: Diagnosis not present

## 2021-01-25 DIAGNOSIS — I509 Heart failure, unspecified: Secondary | ICD-10-CM | POA: Diagnosis not present

## 2021-01-25 DIAGNOSIS — F0280 Dementia in other diseases classified elsewhere without behavioral disturbance: Secondary | ICD-10-CM | POA: Diagnosis not present

## 2021-01-27 DIAGNOSIS — R296 Repeated falls: Secondary | ICD-10-CM | POA: Diagnosis not present

## 2021-01-27 DIAGNOSIS — N183 Chronic kidney disease, stage 3 unspecified: Secondary | ICD-10-CM | POA: Diagnosis not present

## 2021-01-27 DIAGNOSIS — G309 Alzheimer's disease, unspecified: Secondary | ICD-10-CM | POA: Diagnosis not present

## 2021-01-27 DIAGNOSIS — F0280 Dementia in other diseases classified elsewhere without behavioral disturbance: Secondary | ICD-10-CM | POA: Diagnosis not present

## 2021-01-27 DIAGNOSIS — I509 Heart failure, unspecified: Secondary | ICD-10-CM | POA: Diagnosis not present

## 2021-01-27 DIAGNOSIS — I130 Hypertensive heart and chronic kidney disease with heart failure and stage 1 through stage 4 chronic kidney disease, or unspecified chronic kidney disease: Secondary | ICD-10-CM | POA: Diagnosis not present

## 2021-02-01 DIAGNOSIS — F0280 Dementia in other diseases classified elsewhere without behavioral disturbance: Secondary | ICD-10-CM | POA: Diagnosis not present

## 2021-02-01 DIAGNOSIS — G309 Alzheimer's disease, unspecified: Secondary | ICD-10-CM | POA: Diagnosis not present

## 2021-02-01 DIAGNOSIS — I130 Hypertensive heart and chronic kidney disease with heart failure and stage 1 through stage 4 chronic kidney disease, or unspecified chronic kidney disease: Secondary | ICD-10-CM | POA: Diagnosis not present

## 2021-02-01 DIAGNOSIS — R296 Repeated falls: Secondary | ICD-10-CM | POA: Diagnosis not present

## 2021-02-01 DIAGNOSIS — N183 Chronic kidney disease, stage 3 unspecified: Secondary | ICD-10-CM | POA: Diagnosis not present

## 2021-02-01 DIAGNOSIS — I509 Heart failure, unspecified: Secondary | ICD-10-CM | POA: Diagnosis not present

## 2021-02-03 DIAGNOSIS — F0280 Dementia in other diseases classified elsewhere without behavioral disturbance: Secondary | ICD-10-CM | POA: Diagnosis not present

## 2021-02-03 DIAGNOSIS — G309 Alzheimer's disease, unspecified: Secondary | ICD-10-CM | POA: Diagnosis not present

## 2021-02-03 DIAGNOSIS — I509 Heart failure, unspecified: Secondary | ICD-10-CM | POA: Diagnosis not present

## 2021-02-03 DIAGNOSIS — I130 Hypertensive heart and chronic kidney disease with heart failure and stage 1 through stage 4 chronic kidney disease, or unspecified chronic kidney disease: Secondary | ICD-10-CM | POA: Diagnosis not present

## 2021-02-03 DIAGNOSIS — N183 Chronic kidney disease, stage 3 unspecified: Secondary | ICD-10-CM | POA: Diagnosis not present

## 2021-02-03 DIAGNOSIS — R296 Repeated falls: Secondary | ICD-10-CM | POA: Diagnosis not present

## 2021-02-04 ENCOUNTER — Encounter: Payer: Self-pay | Admitting: Nurse Practitioner

## 2021-02-04 ENCOUNTER — Non-Acute Institutional Stay (SKILLED_NURSING_FACILITY): Payer: Medicare Other | Admitting: Nurse Practitioner

## 2021-02-04 DIAGNOSIS — F0280 Dementia in other diseases classified elsewhere without behavioral disturbance: Secondary | ICD-10-CM

## 2021-02-04 DIAGNOSIS — H0100A Unspecified blepharitis right eye, upper and lower eyelids: Secondary | ICD-10-CM | POA: Diagnosis not present

## 2021-02-04 DIAGNOSIS — I7300 Raynaud's syndrome without gangrene: Secondary | ICD-10-CM | POA: Diagnosis not present

## 2021-02-04 DIAGNOSIS — I482 Chronic atrial fibrillation, unspecified: Secondary | ICD-10-CM | POA: Diagnosis not present

## 2021-02-04 DIAGNOSIS — G250 Essential tremor: Secondary | ICD-10-CM

## 2021-02-04 DIAGNOSIS — E46 Unspecified protein-calorie malnutrition: Secondary | ICD-10-CM | POA: Diagnosis not present

## 2021-02-04 DIAGNOSIS — F0150 Vascular dementia without behavioral disturbance: Secondary | ICD-10-CM | POA: Diagnosis not present

## 2021-02-04 DIAGNOSIS — F339 Major depressive disorder, recurrent, unspecified: Secondary | ICD-10-CM | POA: Diagnosis not present

## 2021-02-04 DIAGNOSIS — H0100B Unspecified blepharitis left eye, upper and lower eyelids: Secondary | ICD-10-CM | POA: Diagnosis not present

## 2021-02-04 DIAGNOSIS — K5901 Slow transit constipation: Secondary | ICD-10-CM | POA: Diagnosis not present

## 2021-02-04 DIAGNOSIS — R296 Repeated falls: Secondary | ICD-10-CM | POA: Diagnosis not present

## 2021-02-04 DIAGNOSIS — Z87440 Personal history of urinary (tract) infections: Secondary | ICD-10-CM | POA: Diagnosis not present

## 2021-02-04 DIAGNOSIS — M545 Low back pain, unspecified: Secondary | ICD-10-CM | POA: Diagnosis not present

## 2021-02-04 DIAGNOSIS — R32 Unspecified urinary incontinence: Secondary | ICD-10-CM | POA: Diagnosis not present

## 2021-02-04 DIAGNOSIS — G309 Alzheimer's disease, unspecified: Secondary | ICD-10-CM | POA: Diagnosis not present

## 2021-02-04 DIAGNOSIS — R269 Unspecified abnormalities of gait and mobility: Secondary | ICD-10-CM | POA: Diagnosis not present

## 2021-02-04 DIAGNOSIS — Z993 Dependence on wheelchair: Secondary | ICD-10-CM | POA: Diagnosis not present

## 2021-02-04 DIAGNOSIS — R159 Full incontinence of feces: Secondary | ICD-10-CM | POA: Diagnosis not present

## 2021-02-04 DIAGNOSIS — D631 Anemia in chronic kidney disease: Secondary | ICD-10-CM | POA: Diagnosis not present

## 2021-02-04 DIAGNOSIS — S22080D Wedge compression fracture of T11-T12 vertebra, subsequent encounter for fracture with routine healing: Secondary | ICD-10-CM | POA: Diagnosis not present

## 2021-02-04 DIAGNOSIS — N183 Chronic kidney disease, stage 3 unspecified: Secondary | ICD-10-CM | POA: Diagnosis not present

## 2021-02-04 DIAGNOSIS — I4891 Unspecified atrial fibrillation: Secondary | ICD-10-CM | POA: Diagnosis not present

## 2021-02-04 DIAGNOSIS — F418 Other specified anxiety disorders: Secondary | ICD-10-CM | POA: Diagnosis not present

## 2021-02-04 DIAGNOSIS — Z681 Body mass index (BMI) 19 or less, adult: Secondary | ICD-10-CM | POA: Diagnosis not present

## 2021-02-04 DIAGNOSIS — I509 Heart failure, unspecified: Secondary | ICD-10-CM | POA: Diagnosis not present

## 2021-02-04 DIAGNOSIS — K5909 Other constipation: Secondary | ICD-10-CM | POA: Diagnosis not present

## 2021-02-04 DIAGNOSIS — Z947 Corneal transplant status: Secondary | ICD-10-CM | POA: Diagnosis not present

## 2021-02-04 DIAGNOSIS — Z741 Need for assistance with personal care: Secondary | ICD-10-CM | POA: Diagnosis not present

## 2021-02-04 DIAGNOSIS — H01003 Unspecified blepharitis right eye, unspecified eyelid: Secondary | ICD-10-CM | POA: Diagnosis not present

## 2021-02-04 DIAGNOSIS — R627 Adult failure to thrive: Secondary | ICD-10-CM | POA: Diagnosis not present

## 2021-02-04 DIAGNOSIS — I130 Hypertensive heart and chronic kidney disease with heart failure and stage 1 through stage 4 chronic kidney disease, or unspecified chronic kidney disease: Secondary | ICD-10-CM | POA: Diagnosis not present

## 2021-02-04 NOTE — Assessment & Plan Note (Signed)
able to be redirected with activities,

## 2021-02-04 NOTE — Assessment & Plan Note (Signed)
Chronic lower back pain, takes Tylenol 1000mg  bid/500mg  q8hrs prn.

## 2021-02-04 NOTE — Assessment & Plan Note (Signed)
Afib, heart rate is in control, not on rate control agent or anticoagulation.

## 2021-02-04 NOTE — Assessment & Plan Note (Signed)
dependent of ADLs, off  Propranolol

## 2021-02-04 NOTE — Progress Notes (Signed)
Location:   SNF [facility] Room Number: N031 Place of Service:  SNF (31) Provider: Arna Snipe Christino Mcglinchey NP  Mahlon Gammon, MD  Patient Care Team: Mahlon Gammon, MD as PCP - General (Internal Medicine) Nahser, Deloris Ping, MD as PCP - Cardiology (Cardiology) Shaqueena Mauceri X, NP as Nurse Practitioner (Internal Medicine)  Extended Emergency Contact Information Primary Emergency Contact: Morgan,Tom Address: [address]          [place], [place] [address] [place] of [place] Home Phone: [phone] Relation: Friend Secondary Emergency Contact: Brandy Hale [place] of [place] Home Phone: [phone] Relation: Niece  Code Status:  DNR Goals of care: Advanced Directive information Advanced Directives 02/04/2021  Does Patient Have a Medical Advance Directive? Yes  Type of Estate agent of [place];Out of facility DNR (pink MOST or yellow form)  Does patient want to make changes to medical advance directive? No - Patient declined  Copy of Healthcare Power of Attorney in Chart? Yes - validated most recent copy scanned in chart (See row information)  Would patient like information on creating a medical advance directive? -  Pre-existing out of facility DNR order (yellow form or pink MOST form) Yellow form placed in chart (order not valid for inpatient use);Pink MOST form placed in chart (order not valid for inpatient use)     Chief Complaint  Patient presents with   Medical Management of Chronic Issues    Routine follow up   Health Maintenance    Discuss need for shingles vaccine.      HPI:  Pt is a 87 y.o. female seen today for medical management of chronic diseases.    Advanced dementia, no safety awareness             FTT, supportive care, under Hospice service.              Worsened gait, frailty, wobbly gait, frequent falls, w/c for mobility.              Essential tremor, dependent of ADLs, off  Propranolol              Anxiety/depression, able to be redirected with activities,             Chronic lower back pain, takes Tylenol 1000mg  bid/500mg  q8hrs prn.              Constipation, takes Senokot S II qhs, MOM qod.    Blepharitis bil, chronic. On Erythromycin oint.   Afib, heart rate is in control, not on rate control agent or anticoagulation.    Past Medical History:  Diagnosis Date   Anticoagulant long-term use    Atrial fibrillation (HCC)    CHF (congestive heart failure) (HCC)    Chronic anticoagulation    Hypertension    Raynaud phenomenon    Tremor, essential    Past Surgical History:  Procedure Laterality Date   CHOLECYSTECTOMY     TONSILLECTOMY      Allergies  Allergen Reactions   Fosamax [Alendronate]    Other Nausea And Vomiting    Green pepper    Allergies as of 02/04/2021       Reactions   Fosamax [alendronate]    Other Nausea And Vomiting   Green pepper        Medication List        Accurate as of February 04, 2021 11:59 PM. If you have any questions, ask your nurse or doctor.  STOP taking these medications    erythromycin ophthalmic ointment Stopped by: Nesbit Michon X Dawnetta Copenhaver, NP       TAKE these medications    acetaminophen 500 MG tablet Commonly known as: TYLENOL Take 500 mg by mouth every 8 (eight) hours as needed for mild pain or moderate pain.   acetaminophen 500 MG tablet Commonly known as: TYLENOL Take 1,000 mg by mouth 2 (two) times daily. Not to exceed 3,00 mg daily.   magnesium hydroxide 400 MG/5ML suspension Commonly known as: MILK OF MAGNESIA Take by mouth every other day.   morphine 5 MG/ML injection Inject into the vein. 100 mg/5 mL (20 mg/mL); amt: 5 mg = 0.25 mL; oral Special Instructions: PRN pain/dyspnea/restlessness Every 4 Hours - PRN   sennosides-docusate sodium 8.6-50 MG tablet Commonly known as: SENOKOT-S Take 2 tablets by mouth at bedtime.        Review of Systems  Constitutional:  Negative for fatigue, fever and  unexpected weight change.  HENT:  Positive for hearing loss. Negative for congestion and voice change.   Eyes:  Negative for visual disturbance.       Crusted eyelashes is better.   Respiratory:  Negative for shortness of breath.   Cardiovascular:  Negative for leg swelling.  Gastrointestinal:  Negative for abdominal pain and constipation.  Genitourinary:  Negative for dysuria and urgency.  Musculoskeletal:  Positive for arthralgias, back pain and gait problem.  Skin:  Negative for color change.  Neurological:  Positive for tremors. Negative for speech difficulty.       Dementia  Psychiatric/Behavioral:  Positive for confusion. Negative for sleep disturbance. The patient is not nervous/anxious.    Immunization History  Administered Date(s) Administered   Influenza Whole 05/09/2018   Influenza, High Dose Seasonal PF 05/09/2019   Influenza-Unspecified 05/28/2017, 05/19/2020   Moderna Sars-Covid-2 Vaccination 08/09/2019, 09/06/2019, 06/15/2020   Pneumococcal Conjugate-13 07/13/2017   Pneumococcal Polysaccharide-23 04/07/2002   Tdap 07/13/2017, 10/08/2017   Pertinent  Health Maintenance Due  Topic Date Due   INFLUENZA VACCINE  03/07/2021   DEXA SCAN  Completed   PNA vac Low Risk Adult  Completed   Fall Risk  03/10/2019 10/05/2017  Falls in the past year? (No Data) No  Comment Emmi Telephone Survey: data to providers prior to load -  Number falls in past yr: (No Data) -  Comment Emmi Telephone Survey Actual Response =  -   Functional Status Survey:    Vitals:   02/04/21 1535  BP: 134/70  Pulse: 73  Resp: 18  Temp: 97.9 F (36.6 C)  SpO2: 96%  Weight: 100 lb 8 oz (45.6 kg)  Height: 5\' 4"  (1.626 m)   Body mass index is 17.25 kg/m. Physical Exam Vitals and nursing note reviewed.  Constitutional:      Appearance: Normal appearance.     Comments: frail  HENT:     Head: Normocephalic and atraumatic.     Nose: Nose normal.     Mouth/Throat:     Mouth: Mucous membranes are  moist.  Eyes:     Extraocular Movements: Extraocular movements intact.     Conjunctiva/sclera: Conjunctivae normal.     Pupils: Pupils are equal, round, and reactive to light.     Comments: Crusted eyelashes-better  Cardiovascular:     Rate and Rhythm: Normal rate. Rhythm irregular.     Heart sounds: No murmur heard. Pulmonary:     Breath sounds: No rales.  Abdominal:     Palpations: Abdomen is soft.  Tenderness: There is no abdominal tenderness.  Musculoskeletal:     Cervical back: Normal range of motion and neck supple.     Right lower leg: No edema.     Left lower leg: No edema.  Skin:    General: Skin is warm and dry.  Neurological:     General: No focal deficit present.     Mental Status: She is alert. Mental status is at baseline.     Coordination: Coordination abnormal.     Gait: Gait abnormal.     Comments: Tremor in fingers are worsened. Oriented to self.   Psychiatric:     Comments: Confused, but smiled when she talked to.     Labs reviewed: No results for input(s): NA, K, CL, CO2, GLUCOSE, BUN, CREATININE, CALCIUM, MG, PHOS in the last 8760 hours. No results for input(s): AST, ALT, ALKPHOS, BILITOT, PROT, ALBUMIN in the last 8760 hours. No results for input(s): WBC, NEUTROABS, HGB, HCT, MCV, PLT in the last 8760 hours. Lab Results  Component Value Date   TSH 0.76 05/21/2019   No results found for: HGBA1C No results found for: CHOL, HDL, LDLCALC, LDLDIRECT, TRIG, CHOLHDL  Significant Diagnostic Results in last 30 days:  No results found.  Assessment/Plan  Chronic a-fib (HCC) Afib, heart rate is in control, not on rate control agent or anticoagulation.  Blepharitis of both eyes Blepharitis bil, chronic. On Erythromycin oint.   Slow transit constipation takes Senokot S II qhs, MOM qod.   Lower back pain Chronic lower back pain, takes Tylenol 1000mg  bid/500mg  q8hrs prn.   Depression, recurrent (HCC)  able to be redirected with  activities,  Tremor, essential dependent of ADLs, off  Propranolol  Adult failure to thrive Comfort measures, under Hospice service.   Gait abnormality Worsened gait, frailty, wobbly gait, frequent falls, w/c for mobility  Mixed Alzheimer's and vascular dementia (HCC) Advanced dementia, no safety awareness   Family/ staff Communication: plan of care reviewed with the patient and charge nurse.   Labs/tests ordered:  none  Time spend 25 minutes.

## 2021-02-04 NOTE — Assessment & Plan Note (Signed)
Advanced dementia, no safety awareness

## 2021-02-04 NOTE — Assessment & Plan Note (Signed)
takes Senokot S II qhs, MOM qod.  

## 2021-02-04 NOTE — Assessment & Plan Note (Signed)
Worsened gait, frailty, wobbly gait, frequent falls, w/c for mobility.  

## 2021-02-04 NOTE — Assessment & Plan Note (Signed)
Blepharitis bil, chronic. On Erythromycin oint.

## 2021-02-04 NOTE — Assessment & Plan Note (Signed)
Comfort measures, under Hospice service.

## 2021-02-08 DIAGNOSIS — N183 Chronic kidney disease, stage 3 unspecified: Secondary | ICD-10-CM | POA: Diagnosis not present

## 2021-02-08 DIAGNOSIS — I509 Heart failure, unspecified: Secondary | ICD-10-CM | POA: Diagnosis not present

## 2021-02-08 DIAGNOSIS — F0280 Dementia in other diseases classified elsewhere without behavioral disturbance: Secondary | ICD-10-CM | POA: Diagnosis not present

## 2021-02-08 DIAGNOSIS — R296 Repeated falls: Secondary | ICD-10-CM | POA: Diagnosis not present

## 2021-02-08 DIAGNOSIS — G309 Alzheimer's disease, unspecified: Secondary | ICD-10-CM | POA: Diagnosis not present

## 2021-02-08 DIAGNOSIS — I130 Hypertensive heart and chronic kidney disease with heart failure and stage 1 through stage 4 chronic kidney disease, or unspecified chronic kidney disease: Secondary | ICD-10-CM | POA: Diagnosis not present

## 2021-02-09 ENCOUNTER — Encounter: Payer: Self-pay | Admitting: Nurse Practitioner

## 2021-02-10 DIAGNOSIS — F0280 Dementia in other diseases classified elsewhere without behavioral disturbance: Secondary | ICD-10-CM | POA: Diagnosis not present

## 2021-02-10 DIAGNOSIS — G309 Alzheimer's disease, unspecified: Secondary | ICD-10-CM | POA: Diagnosis not present

## 2021-02-10 DIAGNOSIS — N183 Chronic kidney disease, stage 3 unspecified: Secondary | ICD-10-CM | POA: Diagnosis not present

## 2021-02-10 DIAGNOSIS — I509 Heart failure, unspecified: Secondary | ICD-10-CM | POA: Diagnosis not present

## 2021-02-10 DIAGNOSIS — R296 Repeated falls: Secondary | ICD-10-CM | POA: Diagnosis not present

## 2021-02-10 DIAGNOSIS — I130 Hypertensive heart and chronic kidney disease with heart failure and stage 1 through stage 4 chronic kidney disease, or unspecified chronic kidney disease: Secondary | ICD-10-CM | POA: Diagnosis not present

## 2021-02-11 DIAGNOSIS — N183 Chronic kidney disease, stage 3 unspecified: Secondary | ICD-10-CM | POA: Diagnosis not present

## 2021-02-11 DIAGNOSIS — I130 Hypertensive heart and chronic kidney disease with heart failure and stage 1 through stage 4 chronic kidney disease, or unspecified chronic kidney disease: Secondary | ICD-10-CM | POA: Diagnosis not present

## 2021-02-11 DIAGNOSIS — F0280 Dementia in other diseases classified elsewhere without behavioral disturbance: Secondary | ICD-10-CM | POA: Diagnosis not present

## 2021-02-11 DIAGNOSIS — G309 Alzheimer's disease, unspecified: Secondary | ICD-10-CM | POA: Diagnosis not present

## 2021-02-11 DIAGNOSIS — I509 Heart failure, unspecified: Secondary | ICD-10-CM | POA: Diagnosis not present

## 2021-02-11 DIAGNOSIS — R296 Repeated falls: Secondary | ICD-10-CM | POA: Diagnosis not present

## 2021-02-15 DIAGNOSIS — R296 Repeated falls: Secondary | ICD-10-CM | POA: Diagnosis not present

## 2021-02-15 DIAGNOSIS — F0280 Dementia in other diseases classified elsewhere without behavioral disturbance: Secondary | ICD-10-CM | POA: Diagnosis not present

## 2021-02-15 DIAGNOSIS — G309 Alzheimer's disease, unspecified: Secondary | ICD-10-CM | POA: Diagnosis not present

## 2021-02-15 DIAGNOSIS — I130 Hypertensive heart and chronic kidney disease with heart failure and stage 1 through stage 4 chronic kidney disease, or unspecified chronic kidney disease: Secondary | ICD-10-CM | POA: Diagnosis not present

## 2021-02-15 DIAGNOSIS — N183 Chronic kidney disease, stage 3 unspecified: Secondary | ICD-10-CM | POA: Diagnosis not present

## 2021-02-15 DIAGNOSIS — I509 Heart failure, unspecified: Secondary | ICD-10-CM | POA: Diagnosis not present

## 2021-02-17 DIAGNOSIS — F0280 Dementia in other diseases classified elsewhere without behavioral disturbance: Secondary | ICD-10-CM | POA: Diagnosis not present

## 2021-02-17 DIAGNOSIS — R296 Repeated falls: Secondary | ICD-10-CM | POA: Diagnosis not present

## 2021-02-17 DIAGNOSIS — N183 Chronic kidney disease, stage 3 unspecified: Secondary | ICD-10-CM | POA: Diagnosis not present

## 2021-02-17 DIAGNOSIS — I509 Heart failure, unspecified: Secondary | ICD-10-CM | POA: Diagnosis not present

## 2021-02-17 DIAGNOSIS — G309 Alzheimer's disease, unspecified: Secondary | ICD-10-CM | POA: Diagnosis not present

## 2021-02-17 DIAGNOSIS — I130 Hypertensive heart and chronic kidney disease with heart failure and stage 1 through stage 4 chronic kidney disease, or unspecified chronic kidney disease: Secondary | ICD-10-CM | POA: Diagnosis not present

## 2021-02-21 DIAGNOSIS — N183 Chronic kidney disease, stage 3 unspecified: Secondary | ICD-10-CM | POA: Diagnosis not present

## 2021-02-21 DIAGNOSIS — G309 Alzheimer's disease, unspecified: Secondary | ICD-10-CM | POA: Diagnosis not present

## 2021-02-21 DIAGNOSIS — I509 Heart failure, unspecified: Secondary | ICD-10-CM | POA: Diagnosis not present

## 2021-02-21 DIAGNOSIS — R296 Repeated falls: Secondary | ICD-10-CM | POA: Diagnosis not present

## 2021-02-21 DIAGNOSIS — F0280 Dementia in other diseases classified elsewhere without behavioral disturbance: Secondary | ICD-10-CM | POA: Diagnosis not present

## 2021-02-21 DIAGNOSIS — I130 Hypertensive heart and chronic kidney disease with heart failure and stage 1 through stage 4 chronic kidney disease, or unspecified chronic kidney disease: Secondary | ICD-10-CM | POA: Diagnosis not present

## 2021-02-22 DIAGNOSIS — M79671 Pain in right foot: Secondary | ICD-10-CM | POA: Diagnosis not present

## 2021-02-22 DIAGNOSIS — M79672 Pain in left foot: Secondary | ICD-10-CM | POA: Diagnosis not present

## 2021-02-22 DIAGNOSIS — I509 Heart failure, unspecified: Secondary | ICD-10-CM | POA: Diagnosis not present

## 2021-02-22 DIAGNOSIS — F0280 Dementia in other diseases classified elsewhere without behavioral disturbance: Secondary | ICD-10-CM | POA: Diagnosis not present

## 2021-02-22 DIAGNOSIS — G309 Alzheimer's disease, unspecified: Secondary | ICD-10-CM | POA: Diagnosis not present

## 2021-02-22 DIAGNOSIS — N183 Chronic kidney disease, stage 3 unspecified: Secondary | ICD-10-CM | POA: Diagnosis not present

## 2021-02-22 DIAGNOSIS — B351 Tinea unguium: Secondary | ICD-10-CM | POA: Diagnosis not present

## 2021-02-22 DIAGNOSIS — R296 Repeated falls: Secondary | ICD-10-CM | POA: Diagnosis not present

## 2021-02-22 DIAGNOSIS — I130 Hypertensive heart and chronic kidney disease with heart failure and stage 1 through stage 4 chronic kidney disease, or unspecified chronic kidney disease: Secondary | ICD-10-CM | POA: Diagnosis not present

## 2021-02-24 DIAGNOSIS — I509 Heart failure, unspecified: Secondary | ICD-10-CM | POA: Diagnosis not present

## 2021-02-24 DIAGNOSIS — N183 Chronic kidney disease, stage 3 unspecified: Secondary | ICD-10-CM | POA: Diagnosis not present

## 2021-02-24 DIAGNOSIS — R296 Repeated falls: Secondary | ICD-10-CM | POA: Diagnosis not present

## 2021-02-24 DIAGNOSIS — F0280 Dementia in other diseases classified elsewhere without behavioral disturbance: Secondary | ICD-10-CM | POA: Diagnosis not present

## 2021-02-24 DIAGNOSIS — I130 Hypertensive heart and chronic kidney disease with heart failure and stage 1 through stage 4 chronic kidney disease, or unspecified chronic kidney disease: Secondary | ICD-10-CM | POA: Diagnosis not present

## 2021-02-24 DIAGNOSIS — G309 Alzheimer's disease, unspecified: Secondary | ICD-10-CM | POA: Diagnosis not present

## 2021-03-01 DIAGNOSIS — I509 Heart failure, unspecified: Secondary | ICD-10-CM | POA: Diagnosis not present

## 2021-03-01 DIAGNOSIS — R296 Repeated falls: Secondary | ICD-10-CM | POA: Diagnosis not present

## 2021-03-01 DIAGNOSIS — I130 Hypertensive heart and chronic kidney disease with heart failure and stage 1 through stage 4 chronic kidney disease, or unspecified chronic kidney disease: Secondary | ICD-10-CM | POA: Diagnosis not present

## 2021-03-01 DIAGNOSIS — F0280 Dementia in other diseases classified elsewhere without behavioral disturbance: Secondary | ICD-10-CM | POA: Diagnosis not present

## 2021-03-01 DIAGNOSIS — G309 Alzheimer's disease, unspecified: Secondary | ICD-10-CM | POA: Diagnosis not present

## 2021-03-01 DIAGNOSIS — N183 Chronic kidney disease, stage 3 unspecified: Secondary | ICD-10-CM | POA: Diagnosis not present

## 2021-03-03 DIAGNOSIS — G309 Alzheimer's disease, unspecified: Secondary | ICD-10-CM | POA: Diagnosis not present

## 2021-03-03 DIAGNOSIS — F0280 Dementia in other diseases classified elsewhere without behavioral disturbance: Secondary | ICD-10-CM | POA: Diagnosis not present

## 2021-03-03 DIAGNOSIS — N183 Chronic kidney disease, stage 3 unspecified: Secondary | ICD-10-CM | POA: Diagnosis not present

## 2021-03-03 DIAGNOSIS — I130 Hypertensive heart and chronic kidney disease with heart failure and stage 1 through stage 4 chronic kidney disease, or unspecified chronic kidney disease: Secondary | ICD-10-CM | POA: Diagnosis not present

## 2021-03-03 DIAGNOSIS — R296 Repeated falls: Secondary | ICD-10-CM | POA: Diagnosis not present

## 2021-03-03 DIAGNOSIS — I509 Heart failure, unspecified: Secondary | ICD-10-CM | POA: Diagnosis not present

## 2021-03-07 DIAGNOSIS — G250 Essential tremor: Secondary | ICD-10-CM | POA: Diagnosis not present

## 2021-03-07 DIAGNOSIS — H01003 Unspecified blepharitis right eye, unspecified eyelid: Secondary | ICD-10-CM | POA: Diagnosis not present

## 2021-03-07 DIAGNOSIS — G309 Alzheimer's disease, unspecified: Secondary | ICD-10-CM | POA: Diagnosis not present

## 2021-03-07 DIAGNOSIS — R296 Repeated falls: Secondary | ICD-10-CM | POA: Diagnosis not present

## 2021-03-07 DIAGNOSIS — S22080D Wedge compression fracture of T11-T12 vertebra, subsequent encounter for fracture with routine healing: Secondary | ICD-10-CM | POA: Diagnosis not present

## 2021-03-07 DIAGNOSIS — I7300 Raynaud's syndrome without gangrene: Secondary | ICD-10-CM | POA: Diagnosis not present

## 2021-03-07 DIAGNOSIS — R32 Unspecified urinary incontinence: Secondary | ICD-10-CM | POA: Diagnosis not present

## 2021-03-07 DIAGNOSIS — F0280 Dementia in other diseases classified elsewhere without behavioral disturbance: Secondary | ICD-10-CM | POA: Diagnosis not present

## 2021-03-07 DIAGNOSIS — D631 Anemia in chronic kidney disease: Secondary | ICD-10-CM | POA: Diagnosis not present

## 2021-03-07 DIAGNOSIS — R159 Full incontinence of feces: Secondary | ICD-10-CM | POA: Diagnosis not present

## 2021-03-07 DIAGNOSIS — I130 Hypertensive heart and chronic kidney disease with heart failure and stage 1 through stage 4 chronic kidney disease, or unspecified chronic kidney disease: Secondary | ICD-10-CM | POA: Diagnosis not present

## 2021-03-07 DIAGNOSIS — Z947 Corneal transplant status: Secondary | ICD-10-CM | POA: Diagnosis not present

## 2021-03-07 DIAGNOSIS — E46 Unspecified protein-calorie malnutrition: Secondary | ICD-10-CM | POA: Diagnosis not present

## 2021-03-07 DIAGNOSIS — Z681 Body mass index (BMI) 19 or less, adult: Secondary | ICD-10-CM | POA: Diagnosis not present

## 2021-03-07 DIAGNOSIS — Z741 Need for assistance with personal care: Secondary | ICD-10-CM | POA: Diagnosis not present

## 2021-03-07 DIAGNOSIS — Z87440 Personal history of urinary (tract) infections: Secondary | ICD-10-CM | POA: Diagnosis not present

## 2021-03-07 DIAGNOSIS — K5909 Other constipation: Secondary | ICD-10-CM | POA: Diagnosis not present

## 2021-03-07 DIAGNOSIS — N183 Chronic kidney disease, stage 3 unspecified: Secondary | ICD-10-CM | POA: Diagnosis not present

## 2021-03-07 DIAGNOSIS — I4891 Unspecified atrial fibrillation: Secondary | ICD-10-CM | POA: Diagnosis not present

## 2021-03-07 DIAGNOSIS — Z993 Dependence on wheelchair: Secondary | ICD-10-CM | POA: Diagnosis not present

## 2021-03-07 DIAGNOSIS — F418 Other specified anxiety disorders: Secondary | ICD-10-CM | POA: Diagnosis not present

## 2021-03-07 DIAGNOSIS — I509 Heart failure, unspecified: Secondary | ICD-10-CM | POA: Diagnosis not present

## 2021-03-08 DIAGNOSIS — R296 Repeated falls: Secondary | ICD-10-CM | POA: Diagnosis not present

## 2021-03-08 DIAGNOSIS — G309 Alzheimer's disease, unspecified: Secondary | ICD-10-CM | POA: Diagnosis not present

## 2021-03-08 DIAGNOSIS — I509 Heart failure, unspecified: Secondary | ICD-10-CM | POA: Diagnosis not present

## 2021-03-08 DIAGNOSIS — I130 Hypertensive heart and chronic kidney disease with heart failure and stage 1 through stage 4 chronic kidney disease, or unspecified chronic kidney disease: Secondary | ICD-10-CM | POA: Diagnosis not present

## 2021-03-08 DIAGNOSIS — N183 Chronic kidney disease, stage 3 unspecified: Secondary | ICD-10-CM | POA: Diagnosis not present

## 2021-03-08 DIAGNOSIS — F0280 Dementia in other diseases classified elsewhere without behavioral disturbance: Secondary | ICD-10-CM | POA: Diagnosis not present

## 2021-03-09 ENCOUNTER — Encounter: Payer: Self-pay | Admitting: Nurse Practitioner

## 2021-03-09 ENCOUNTER — Non-Acute Institutional Stay (SKILLED_NURSING_FACILITY): Payer: Medicare Other | Admitting: Nurse Practitioner

## 2021-03-09 DIAGNOSIS — M545 Low back pain, unspecified: Secondary | ICD-10-CM

## 2021-03-09 DIAGNOSIS — F0280 Dementia in other diseases classified elsewhere without behavioral disturbance: Secondary | ICD-10-CM

## 2021-03-09 DIAGNOSIS — G250 Essential tremor: Secondary | ICD-10-CM | POA: Diagnosis not present

## 2021-03-09 DIAGNOSIS — N183 Chronic kidney disease, stage 3 unspecified: Secondary | ICD-10-CM | POA: Diagnosis not present

## 2021-03-09 DIAGNOSIS — I482 Chronic atrial fibrillation, unspecified: Secondary | ICD-10-CM

## 2021-03-09 DIAGNOSIS — F0150 Vascular dementia without behavioral disturbance: Secondary | ICD-10-CM

## 2021-03-09 DIAGNOSIS — R627 Adult failure to thrive: Secondary | ICD-10-CM

## 2021-03-09 DIAGNOSIS — F339 Major depressive disorder, recurrent, unspecified: Secondary | ICD-10-CM | POA: Diagnosis not present

## 2021-03-09 DIAGNOSIS — H0100A Unspecified blepharitis right eye, upper and lower eyelids: Secondary | ICD-10-CM

## 2021-03-09 DIAGNOSIS — R296 Repeated falls: Secondary | ICD-10-CM | POA: Diagnosis not present

## 2021-03-09 DIAGNOSIS — I130 Hypertensive heart and chronic kidney disease with heart failure and stage 1 through stage 4 chronic kidney disease, or unspecified chronic kidney disease: Secondary | ICD-10-CM | POA: Diagnosis not present

## 2021-03-09 DIAGNOSIS — H0100B Unspecified blepharitis left eye, upper and lower eyelids: Secondary | ICD-10-CM

## 2021-03-09 DIAGNOSIS — K5901 Slow transit constipation: Secondary | ICD-10-CM

## 2021-03-09 DIAGNOSIS — G309 Alzheimer's disease, unspecified: Secondary | ICD-10-CM

## 2021-03-09 DIAGNOSIS — I509 Heart failure, unspecified: Secondary | ICD-10-CM | POA: Diagnosis not present

## 2021-03-09 NOTE — Progress Notes (Signed)
Location:   [facility] Room Number: [phone] Place of Service:  SNF (31) Provider:  Britaney Espaillat Johnney Ou, NP  Mahlon Gammon, MD  Patient Care Team: Mahlon Gammon, MD as PCP - General (Internal Medicine) Nahser, Deloris Ping, MD as PCP - Cardiology (Cardiology) Kruze Atchley X, NP as Nurse Practitioner (Internal Medicine)  Extended Emergency Contact Information Primary Emergency Contact: Morgan,Tom Address: [address]          [place], [place] [address] [place] Home Phone: [phone] Relation: Friend Secondary Emergency Contact: Brandy Hale [place] of [place] Home Phone: [phone] Relation: Niece  Code Status:  DNR Goals of care: Advanced Directive information Advanced Directives 03/09/2021  Does Patient Have a Medical Advance Directive? Yes  Type of Estate agent of [place];Out of facility DNR (pink MOST or yellow form)  Does patient want to make changes to medical advance directive? No - Patient declined  Copy of Healthcare Power of Attorney in Chart? Yes - validated most recent copy scanned in chart (See row information)  Would patient like information on creating a medical advance directive? -  Pre-existing out of facility DNR order (yellow form or pink MOST form) Yellow form placed in chart (order not valid for inpatient use);Pink MOST form placed in chart (order not valid for inpatient use)     Chief Complaint  Patient presents with   Medical Management of Chronic Issues    Routine follow up    Health Maintenance    Discuss need for shingles vaccine    HPI:  Pt is a 87 y.o. female seen today for medical management of chronic diseases.      Advanced dementia, no safety awareness, total care of ADLs             FTT, supportive care, under Hospice service.              Essential tremor, dependent of ADLs, off  Propranolol             Anxiety/depression, able to be redirected with activities,              Chronic lower back pain, takes Tylenol 1000mg  bid/500mg  q8hrs prn, prn Morphine             Constipation, takes Senokot S II qhs, MOM qod.               Blepharitis bil, chronic.             Afib, heart rate is in control, not on rate control agent or anticoagulation.             Past Medical History:  Diagnosis Date   Anticoagulant long-term use    Atrial fibrillation (HCC)    CHF (congestive heart failure) (HCC)    Chronic anticoagulation    Hypertension    Raynaud phenomenon    Tremor, essential    Past Surgical History:  Procedure Laterality Date   CHOLECYSTECTOMY     TONSILLECTOMY      Allergies  Allergen Reactions   Fosamax [Alendronate]    Other Nausea And Vomiting    Green pepper    Allergies as of 03/09/2021       Reactions   Fosamax [alendronate]    Other Nausea And Vomiting   Green pepper        Medication List        Accurate as of March 09, 2021  2:41 PM.  If you have any questions, ask your nurse or doctor.          acetaminophen 500 MG tablet Commonly known as: TYLENOL Take 500 mg by mouth every 8 (eight) hours as needed for mild pain or moderate pain.   acetaminophen 500 MG tablet Commonly known as: TYLENOL Take 1,000 mg by mouth 2 (two) times daily. Not to exceed 3,00 mg daily.   magnesium hydroxide 400 MG/5ML suspension Commonly known as: MILK OF MAGNESIA Take by mouth every other day.   morphine 5 MG/ML injection Inject into the vein. 100 mg/5 mL (20 mg/mL); amt: 5 mg = 0.25 mL; oral Special Instructions: PRN pain/dyspnea/restlessness Every 4 Hours - PRN   nystatin powder Generic drug: nystatin Apply 1 application topically 2 (two) times daily.   sennosides-docusate sodium 8.6-50 MG tablet Commonly known as: SENOKOT-S Take 2 tablets by mouth at bedtime.        Review of Systems  Constitutional:  Negative for activity change, appetite change, fever and unexpected weight change.  HENT:  Positive for hearing loss.  Negative for congestion and voice change.   Eyes:  Negative for visual disturbance.       Crusted eyelashes is better.   Respiratory:  Negative for shortness of breath.   Cardiovascular:  Negative for leg swelling.  Gastrointestinal:  Negative for abdominal pain and constipation.  Genitourinary:  Negative for dysuria and urgency.  Musculoskeletal:  Positive for arthralgias, back pain and gait problem.  Skin:  Negative for color change.  Neurological:  Positive for tremors. Negative for speech difficulty.       Dementia  Psychiatric/Behavioral:  Positive for confusion. Negative for sleep disturbance. The patient is not nervous/anxious.    Immunization History  Administered Date(s) Administered   Influenza Whole 05/09/2018   Influenza, High Dose Seasonal PF 05/09/2019   Influenza-Unspecified 05/28/2017, 05/19/2020   Moderna Sars-Covid-2 Vaccination 08/09/2019, 09/06/2019, 06/15/2020   Pneumococcal Conjugate-13 07/13/2017   Pneumococcal Polysaccharide-23 04/07/2002   Tdap 07/13/2017, 10/08/2017   Pertinent  Health Maintenance Due  Topic Date Due   INFLUENZA VACCINE  03/07/2021   DEXA SCAN  Completed   PNA vac Low Risk Adult  Completed   Fall Risk  03/10/2019 10/05/2017  Falls in the past year? (No Data) No  Comment Emmi Telephone Survey: data to providers prior to load -  Number falls in past yr: (No Data) -  Comment Emmi Telephone Survey Actual Response =  -   Functional Status Survey:    Vitals:   03/09/21 0827  BP: (!) 158/82  Pulse: 92  Resp: 16  Temp: 97.7 F (36.5 C)  SpO2: 97%  Weight: 102 lb 4.8 oz (46.4 kg)  Height: 5\' 4"  (1.626 m)   Body mass index is 17.56 kg/m. Physical Exam Vitals and nursing note reviewed.  Constitutional:      Appearance: Normal appearance.     Comments: frail  HENT:     Head: Normocephalic and atraumatic.     Nose: Nose normal.     Mouth/Throat:     Mouth: Mucous membranes are moist.  Eyes:     Extraocular Movements: Extraocular  movements intact.     Conjunctiva/sclera: Conjunctivae normal.     Pupils: Pupils are equal, round, and reactive to light.     Comments: Crusted eyelashes-better  Cardiovascular:     Rate and Rhythm: Normal rate. Rhythm irregular.     Heart sounds: No murmur heard. Pulmonary:     Breath sounds: No rales.  Abdominal:     Palpations: Abdomen is soft.     Tenderness: There is no abdominal tenderness.  Musculoskeletal:     Cervical back: Normal range of motion and neck supple.     Right lower leg: No edema.     Left lower leg: No edema.  Skin:    General: Skin is warm and dry.  Neurological:     General: No focal deficit present.     Mental Status: She is alert. Mental status is at baseline.     Coordination: Coordination abnormal.     Gait: Gait abnormal.     Comments: Tremor in fingers are worsened. Oriented to self.   Psychiatric:     Comments: Confused, but smiled when she talked to.     Labs reviewed: No results for input(s): NA, K, CL, CO2, GLUCOSE, BUN, CREATININE, CALCIUM, MG, PHOS in the last 8760 hours. No results for input(s): AST, ALT, ALKPHOS, BILITOT, PROT, ALBUMIN in the last 8760 hours. No results for input(s): WBC, NEUTROABS, HGB, HCT, MCV, PLT in the last 8760 hours. Lab Results  Component Value Date   TSH 0.76 05/21/2019   No results found for: HGBA1C No results found for: CHOL, HDL, LDLCALC, LDLDIRECT, TRIG, CHOLHDL  Significant Diagnostic Results in last 30 days:  No results found.  Assessment/Plan Mixed Alzheimer's and vascular dementia (HCC) Advanced dementia, no safety awareness, total care of ADLs  Adult failure to thrive supportive care, under Hospice service, plan of care is comfort measures  Tremor, essential dependent of ADLs, off  Propranolol  Depression, recurrent (HCC) able to be redirected with activities,  Lower back pain Chronic lower back pain, takes Tylenol 1000mg  bid/500mg  q8hrs prn, prn Morphine  Slow transit  constipation takes Senokot S II qhs, MOM qod.   Blepharitis of both eyes Assist the patient with eyelid/lashes scrubbing/cleansing daily.    Chronic a-fib (HCC) heart rate is in control, not on rate control agent or anticoagulation.      Family/ staff Communication: plan of care reviewed with the patient and charge nurse.   Labs/tests ordered:  none  Time spend 25 minutes.

## 2021-03-09 NOTE — Assessment & Plan Note (Signed)
Assist the patient with eyelid/lashes scrubbing/cleansing daily.

## 2021-03-09 NOTE — Assessment & Plan Note (Signed)
Advanced dementia, no safety awareness, total care of ADLs

## 2021-03-09 NOTE — Assessment & Plan Note (Signed)
takes Senokot S II qhs, MOM qod.

## 2021-03-09 NOTE — Assessment & Plan Note (Signed)
Chronic lower back pain, takes Tylenol 1000mg  bid/500mg  q8hrs prn, prn Morphine

## 2021-03-09 NOTE — Assessment & Plan Note (Signed)
able to be redirected with activities,

## 2021-03-09 NOTE — Assessment & Plan Note (Signed)
heart rate is in control, not on rate control agent or anticoagulation. 

## 2021-03-09 NOTE — Assessment & Plan Note (Signed)
dependent of ADLs, off  Propranolol

## 2021-03-09 NOTE — Assessment & Plan Note (Signed)
supportive care, under Hospice service, plan of care is comfort measures

## 2021-03-10 DIAGNOSIS — R296 Repeated falls: Secondary | ICD-10-CM | POA: Diagnosis not present

## 2021-03-10 DIAGNOSIS — F0280 Dementia in other diseases classified elsewhere without behavioral disturbance: Secondary | ICD-10-CM | POA: Diagnosis not present

## 2021-03-10 DIAGNOSIS — I509 Heart failure, unspecified: Secondary | ICD-10-CM | POA: Diagnosis not present

## 2021-03-10 DIAGNOSIS — I130 Hypertensive heart and chronic kidney disease with heart failure and stage 1 through stage 4 chronic kidney disease, or unspecified chronic kidney disease: Secondary | ICD-10-CM | POA: Diagnosis not present

## 2021-03-10 DIAGNOSIS — N183 Chronic kidney disease, stage 3 unspecified: Secondary | ICD-10-CM | POA: Diagnosis not present

## 2021-03-10 DIAGNOSIS — G309 Alzheimer's disease, unspecified: Secondary | ICD-10-CM | POA: Diagnosis not present

## 2021-03-15 DIAGNOSIS — I509 Heart failure, unspecified: Secondary | ICD-10-CM | POA: Diagnosis not present

## 2021-03-15 DIAGNOSIS — G309 Alzheimer's disease, unspecified: Secondary | ICD-10-CM | POA: Diagnosis not present

## 2021-03-15 DIAGNOSIS — I130 Hypertensive heart and chronic kidney disease with heart failure and stage 1 through stage 4 chronic kidney disease, or unspecified chronic kidney disease: Secondary | ICD-10-CM | POA: Diagnosis not present

## 2021-03-15 DIAGNOSIS — N183 Chronic kidney disease, stage 3 unspecified: Secondary | ICD-10-CM | POA: Diagnosis not present

## 2021-03-15 DIAGNOSIS — F0280 Dementia in other diseases classified elsewhere without behavioral disturbance: Secondary | ICD-10-CM | POA: Diagnosis not present

## 2021-03-15 DIAGNOSIS — R296 Repeated falls: Secondary | ICD-10-CM | POA: Diagnosis not present

## 2021-03-16 DIAGNOSIS — F0280 Dementia in other diseases classified elsewhere without behavioral disturbance: Secondary | ICD-10-CM | POA: Diagnosis not present

## 2021-03-16 DIAGNOSIS — I130 Hypertensive heart and chronic kidney disease with heart failure and stage 1 through stage 4 chronic kidney disease, or unspecified chronic kidney disease: Secondary | ICD-10-CM | POA: Diagnosis not present

## 2021-03-16 DIAGNOSIS — I509 Heart failure, unspecified: Secondary | ICD-10-CM | POA: Diagnosis not present

## 2021-03-16 DIAGNOSIS — R296 Repeated falls: Secondary | ICD-10-CM | POA: Diagnosis not present

## 2021-03-16 DIAGNOSIS — G309 Alzheimer's disease, unspecified: Secondary | ICD-10-CM | POA: Diagnosis not present

## 2021-03-16 DIAGNOSIS — N183 Chronic kidney disease, stage 3 unspecified: Secondary | ICD-10-CM | POA: Diagnosis not present

## 2021-03-17 DIAGNOSIS — R296 Repeated falls: Secondary | ICD-10-CM | POA: Diagnosis not present

## 2021-03-17 DIAGNOSIS — N183 Chronic kidney disease, stage 3 unspecified: Secondary | ICD-10-CM | POA: Diagnosis not present

## 2021-03-17 DIAGNOSIS — I130 Hypertensive heart and chronic kidney disease with heart failure and stage 1 through stage 4 chronic kidney disease, or unspecified chronic kidney disease: Secondary | ICD-10-CM | POA: Diagnosis not present

## 2021-03-17 DIAGNOSIS — G309 Alzheimer's disease, unspecified: Secondary | ICD-10-CM | POA: Diagnosis not present

## 2021-03-17 DIAGNOSIS — F0280 Dementia in other diseases classified elsewhere without behavioral disturbance: Secondary | ICD-10-CM | POA: Diagnosis not present

## 2021-03-17 DIAGNOSIS — I509 Heart failure, unspecified: Secondary | ICD-10-CM | POA: Diagnosis not present

## 2021-03-22 DIAGNOSIS — G309 Alzheimer's disease, unspecified: Secondary | ICD-10-CM | POA: Diagnosis not present

## 2021-03-22 DIAGNOSIS — N183 Chronic kidney disease, stage 3 unspecified: Secondary | ICD-10-CM | POA: Diagnosis not present

## 2021-03-22 DIAGNOSIS — R296 Repeated falls: Secondary | ICD-10-CM | POA: Diagnosis not present

## 2021-03-22 DIAGNOSIS — F0280 Dementia in other diseases classified elsewhere without behavioral disturbance: Secondary | ICD-10-CM | POA: Diagnosis not present

## 2021-03-22 DIAGNOSIS — I509 Heart failure, unspecified: Secondary | ICD-10-CM | POA: Diagnosis not present

## 2021-03-22 DIAGNOSIS — I130 Hypertensive heart and chronic kidney disease with heart failure and stage 1 through stage 4 chronic kidney disease, or unspecified chronic kidney disease: Secondary | ICD-10-CM | POA: Diagnosis not present

## 2021-03-23 DIAGNOSIS — I130 Hypertensive heart and chronic kidney disease with heart failure and stage 1 through stage 4 chronic kidney disease, or unspecified chronic kidney disease: Secondary | ICD-10-CM | POA: Diagnosis not present

## 2021-03-23 DIAGNOSIS — N183 Chronic kidney disease, stage 3 unspecified: Secondary | ICD-10-CM | POA: Diagnosis not present

## 2021-03-23 DIAGNOSIS — F0280 Dementia in other diseases classified elsewhere without behavioral disturbance: Secondary | ICD-10-CM | POA: Diagnosis not present

## 2021-03-23 DIAGNOSIS — G309 Alzheimer's disease, unspecified: Secondary | ICD-10-CM | POA: Diagnosis not present

## 2021-03-23 DIAGNOSIS — R296 Repeated falls: Secondary | ICD-10-CM | POA: Diagnosis not present

## 2021-03-23 DIAGNOSIS — I509 Heart failure, unspecified: Secondary | ICD-10-CM | POA: Diagnosis not present

## 2021-03-24 DIAGNOSIS — G309 Alzheimer's disease, unspecified: Secondary | ICD-10-CM | POA: Diagnosis not present

## 2021-03-24 DIAGNOSIS — I509 Heart failure, unspecified: Secondary | ICD-10-CM | POA: Diagnosis not present

## 2021-03-24 DIAGNOSIS — I130 Hypertensive heart and chronic kidney disease with heart failure and stage 1 through stage 4 chronic kidney disease, or unspecified chronic kidney disease: Secondary | ICD-10-CM | POA: Diagnosis not present

## 2021-03-24 DIAGNOSIS — F0280 Dementia in other diseases classified elsewhere without behavioral disturbance: Secondary | ICD-10-CM | POA: Diagnosis not present

## 2021-03-24 DIAGNOSIS — R296 Repeated falls: Secondary | ICD-10-CM | POA: Diagnosis not present

## 2021-03-24 DIAGNOSIS — N183 Chronic kidney disease, stage 3 unspecified: Secondary | ICD-10-CM | POA: Diagnosis not present

## 2021-03-29 DIAGNOSIS — R296 Repeated falls: Secondary | ICD-10-CM | POA: Diagnosis not present

## 2021-03-29 DIAGNOSIS — N183 Chronic kidney disease, stage 3 unspecified: Secondary | ICD-10-CM | POA: Diagnosis not present

## 2021-03-29 DIAGNOSIS — I509 Heart failure, unspecified: Secondary | ICD-10-CM | POA: Diagnosis not present

## 2021-03-29 DIAGNOSIS — F0280 Dementia in other diseases classified elsewhere without behavioral disturbance: Secondary | ICD-10-CM | POA: Diagnosis not present

## 2021-03-29 DIAGNOSIS — I130 Hypertensive heart and chronic kidney disease with heart failure and stage 1 through stage 4 chronic kidney disease, or unspecified chronic kidney disease: Secondary | ICD-10-CM | POA: Diagnosis not present

## 2021-03-29 DIAGNOSIS — G309 Alzheimer's disease, unspecified: Secondary | ICD-10-CM | POA: Diagnosis not present

## 2021-03-31 DIAGNOSIS — R296 Repeated falls: Secondary | ICD-10-CM | POA: Diagnosis not present

## 2021-03-31 DIAGNOSIS — N183 Chronic kidney disease, stage 3 unspecified: Secondary | ICD-10-CM | POA: Diagnosis not present

## 2021-03-31 DIAGNOSIS — I130 Hypertensive heart and chronic kidney disease with heart failure and stage 1 through stage 4 chronic kidney disease, or unspecified chronic kidney disease: Secondary | ICD-10-CM | POA: Diagnosis not present

## 2021-03-31 DIAGNOSIS — F0280 Dementia in other diseases classified elsewhere without behavioral disturbance: Secondary | ICD-10-CM | POA: Diagnosis not present

## 2021-03-31 DIAGNOSIS — I509 Heart failure, unspecified: Secondary | ICD-10-CM | POA: Diagnosis not present

## 2021-03-31 DIAGNOSIS — G309 Alzheimer's disease, unspecified: Secondary | ICD-10-CM | POA: Diagnosis not present

## 2021-04-05 DIAGNOSIS — F0280 Dementia in other diseases classified elsewhere without behavioral disturbance: Secondary | ICD-10-CM | POA: Diagnosis not present

## 2021-04-05 DIAGNOSIS — G309 Alzheimer's disease, unspecified: Secondary | ICD-10-CM | POA: Diagnosis not present

## 2021-04-05 DIAGNOSIS — N183 Chronic kidney disease, stage 3 unspecified: Secondary | ICD-10-CM | POA: Diagnosis not present

## 2021-04-05 DIAGNOSIS — I130 Hypertensive heart and chronic kidney disease with heart failure and stage 1 through stage 4 chronic kidney disease, or unspecified chronic kidney disease: Secondary | ICD-10-CM | POA: Diagnosis not present

## 2021-04-05 DIAGNOSIS — I509 Heart failure, unspecified: Secondary | ICD-10-CM | POA: Diagnosis not present

## 2021-04-05 DIAGNOSIS — R296 Repeated falls: Secondary | ICD-10-CM | POA: Diagnosis not present

## 2021-04-06 DIAGNOSIS — F0280 Dementia in other diseases classified elsewhere without behavioral disturbance: Secondary | ICD-10-CM | POA: Diagnosis not present

## 2021-04-06 DIAGNOSIS — R296 Repeated falls: Secondary | ICD-10-CM | POA: Diagnosis not present

## 2021-04-06 DIAGNOSIS — I509 Heart failure, unspecified: Secondary | ICD-10-CM | POA: Diagnosis not present

## 2021-04-06 DIAGNOSIS — G309 Alzheimer's disease, unspecified: Secondary | ICD-10-CM | POA: Diagnosis not present

## 2021-04-06 DIAGNOSIS — N183 Chronic kidney disease, stage 3 unspecified: Secondary | ICD-10-CM | POA: Diagnosis not present

## 2021-04-06 DIAGNOSIS — I130 Hypertensive heart and chronic kidney disease with heart failure and stage 1 through stage 4 chronic kidney disease, or unspecified chronic kidney disease: Secondary | ICD-10-CM | POA: Diagnosis not present

## 2021-04-07 DIAGNOSIS — R32 Unspecified urinary incontinence: Secondary | ICD-10-CM | POA: Diagnosis not present

## 2021-04-07 DIAGNOSIS — F0280 Dementia in other diseases classified elsewhere without behavioral disturbance: Secondary | ICD-10-CM | POA: Diagnosis not present

## 2021-04-07 DIAGNOSIS — S22080D Wedge compression fracture of T11-T12 vertebra, subsequent encounter for fracture with routine healing: Secondary | ICD-10-CM | POA: Diagnosis not present

## 2021-04-07 DIAGNOSIS — I509 Heart failure, unspecified: Secondary | ICD-10-CM | POA: Diagnosis not present

## 2021-04-07 DIAGNOSIS — Z87440 Personal history of urinary (tract) infections: Secondary | ICD-10-CM | POA: Diagnosis not present

## 2021-04-07 DIAGNOSIS — D631 Anemia in chronic kidney disease: Secondary | ICD-10-CM | POA: Diagnosis not present

## 2021-04-07 DIAGNOSIS — I4891 Unspecified atrial fibrillation: Secondary | ICD-10-CM | POA: Diagnosis not present

## 2021-04-07 DIAGNOSIS — K5909 Other constipation: Secondary | ICD-10-CM | POA: Diagnosis not present

## 2021-04-07 DIAGNOSIS — R296 Repeated falls: Secondary | ICD-10-CM | POA: Diagnosis not present

## 2021-04-07 DIAGNOSIS — H01003 Unspecified blepharitis right eye, unspecified eyelid: Secondary | ICD-10-CM | POA: Diagnosis not present

## 2021-04-07 DIAGNOSIS — Z993 Dependence on wheelchair: Secondary | ICD-10-CM | POA: Diagnosis not present

## 2021-04-07 DIAGNOSIS — I130 Hypertensive heart and chronic kidney disease with heart failure and stage 1 through stage 4 chronic kidney disease, or unspecified chronic kidney disease: Secondary | ICD-10-CM | POA: Diagnosis not present

## 2021-04-07 DIAGNOSIS — I7300 Raynaud's syndrome without gangrene: Secondary | ICD-10-CM | POA: Diagnosis not present

## 2021-04-07 DIAGNOSIS — Z741 Need for assistance with personal care: Secondary | ICD-10-CM | POA: Diagnosis not present

## 2021-04-07 DIAGNOSIS — G250 Essential tremor: Secondary | ICD-10-CM | POA: Diagnosis not present

## 2021-04-07 DIAGNOSIS — N183 Chronic kidney disease, stage 3 unspecified: Secondary | ICD-10-CM | POA: Diagnosis not present

## 2021-04-07 DIAGNOSIS — G309 Alzheimer's disease, unspecified: Secondary | ICD-10-CM | POA: Diagnosis not present

## 2021-04-07 DIAGNOSIS — R451 Restlessness and agitation: Secondary | ICD-10-CM | POA: Diagnosis not present

## 2021-04-07 DIAGNOSIS — F418 Other specified anxiety disorders: Secondary | ICD-10-CM | POA: Diagnosis not present

## 2021-04-07 DIAGNOSIS — Z947 Corneal transplant status: Secondary | ICD-10-CM | POA: Diagnosis not present

## 2021-04-07 DIAGNOSIS — E46 Unspecified protein-calorie malnutrition: Secondary | ICD-10-CM | POA: Diagnosis not present

## 2021-04-07 DIAGNOSIS — R159 Full incontinence of feces: Secondary | ICD-10-CM | POA: Diagnosis not present

## 2021-04-07 DIAGNOSIS — Z681 Body mass index (BMI) 19 or less, adult: Secondary | ICD-10-CM | POA: Diagnosis not present

## 2021-04-11 DIAGNOSIS — I130 Hypertensive heart and chronic kidney disease with heart failure and stage 1 through stage 4 chronic kidney disease, or unspecified chronic kidney disease: Secondary | ICD-10-CM | POA: Diagnosis not present

## 2021-04-11 DIAGNOSIS — G309 Alzheimer's disease, unspecified: Secondary | ICD-10-CM | POA: Diagnosis not present

## 2021-04-11 DIAGNOSIS — I509 Heart failure, unspecified: Secondary | ICD-10-CM | POA: Diagnosis not present

## 2021-04-11 DIAGNOSIS — N183 Chronic kidney disease, stage 3 unspecified: Secondary | ICD-10-CM | POA: Diagnosis not present

## 2021-04-11 DIAGNOSIS — R296 Repeated falls: Secondary | ICD-10-CM | POA: Diagnosis not present

## 2021-04-11 DIAGNOSIS — F0280 Dementia in other diseases classified elsewhere without behavioral disturbance: Secondary | ICD-10-CM | POA: Diagnosis not present

## 2021-04-12 DIAGNOSIS — R296 Repeated falls: Secondary | ICD-10-CM | POA: Diagnosis not present

## 2021-04-12 DIAGNOSIS — I509 Heart failure, unspecified: Secondary | ICD-10-CM | POA: Diagnosis not present

## 2021-04-12 DIAGNOSIS — I130 Hypertensive heart and chronic kidney disease with heart failure and stage 1 through stage 4 chronic kidney disease, or unspecified chronic kidney disease: Secondary | ICD-10-CM | POA: Diagnosis not present

## 2021-04-12 DIAGNOSIS — G309 Alzheimer's disease, unspecified: Secondary | ICD-10-CM | POA: Diagnosis not present

## 2021-04-12 DIAGNOSIS — F0280 Dementia in other diseases classified elsewhere without behavioral disturbance: Secondary | ICD-10-CM | POA: Diagnosis not present

## 2021-04-12 DIAGNOSIS — N183 Chronic kidney disease, stage 3 unspecified: Secondary | ICD-10-CM | POA: Diagnosis not present

## 2021-04-14 DIAGNOSIS — I130 Hypertensive heart and chronic kidney disease with heart failure and stage 1 through stage 4 chronic kidney disease, or unspecified chronic kidney disease: Secondary | ICD-10-CM | POA: Diagnosis not present

## 2021-04-14 DIAGNOSIS — R296 Repeated falls: Secondary | ICD-10-CM | POA: Diagnosis not present

## 2021-04-14 DIAGNOSIS — I509 Heart failure, unspecified: Secondary | ICD-10-CM | POA: Diagnosis not present

## 2021-04-14 DIAGNOSIS — G309 Alzheimer's disease, unspecified: Secondary | ICD-10-CM | POA: Diagnosis not present

## 2021-04-14 DIAGNOSIS — N183 Chronic kidney disease, stage 3 unspecified: Secondary | ICD-10-CM | POA: Diagnosis not present

## 2021-04-14 DIAGNOSIS — F0280 Dementia in other diseases classified elsewhere without behavioral disturbance: Secondary | ICD-10-CM | POA: Diagnosis not present

## 2021-04-15 DIAGNOSIS — F0280 Dementia in other diseases classified elsewhere without behavioral disturbance: Secondary | ICD-10-CM | POA: Diagnosis not present

## 2021-04-15 DIAGNOSIS — R296 Repeated falls: Secondary | ICD-10-CM | POA: Diagnosis not present

## 2021-04-15 DIAGNOSIS — G309 Alzheimer's disease, unspecified: Secondary | ICD-10-CM | POA: Diagnosis not present

## 2021-04-15 DIAGNOSIS — I130 Hypertensive heart and chronic kidney disease with heart failure and stage 1 through stage 4 chronic kidney disease, or unspecified chronic kidney disease: Secondary | ICD-10-CM | POA: Diagnosis not present

## 2021-04-15 DIAGNOSIS — I509 Heart failure, unspecified: Secondary | ICD-10-CM | POA: Diagnosis not present

## 2021-04-15 DIAGNOSIS — N183 Chronic kidney disease, stage 3 unspecified: Secondary | ICD-10-CM | POA: Diagnosis not present

## 2021-04-18 DIAGNOSIS — F0280 Dementia in other diseases classified elsewhere without behavioral disturbance: Secondary | ICD-10-CM | POA: Diagnosis not present

## 2021-04-18 DIAGNOSIS — R296 Repeated falls: Secondary | ICD-10-CM | POA: Diagnosis not present

## 2021-04-18 DIAGNOSIS — I130 Hypertensive heart and chronic kidney disease with heart failure and stage 1 through stage 4 chronic kidney disease, or unspecified chronic kidney disease: Secondary | ICD-10-CM | POA: Diagnosis not present

## 2021-04-18 DIAGNOSIS — I509 Heart failure, unspecified: Secondary | ICD-10-CM | POA: Diagnosis not present

## 2021-04-18 DIAGNOSIS — G309 Alzheimer's disease, unspecified: Secondary | ICD-10-CM | POA: Diagnosis not present

## 2021-04-18 DIAGNOSIS — N183 Chronic kidney disease, stage 3 unspecified: Secondary | ICD-10-CM | POA: Diagnosis not present

## 2021-04-19 ENCOUNTER — Encounter: Payer: Self-pay | Admitting: Internal Medicine

## 2021-04-19 ENCOUNTER — Non-Acute Institutional Stay (SKILLED_NURSING_FACILITY): Admitting: Internal Medicine

## 2021-04-19 DIAGNOSIS — G309 Alzheimer's disease, unspecified: Secondary | ICD-10-CM | POA: Diagnosis not present

## 2021-04-19 DIAGNOSIS — F339 Major depressive disorder, recurrent, unspecified: Secondary | ICD-10-CM | POA: Diagnosis not present

## 2021-04-19 DIAGNOSIS — N183 Chronic kidney disease, stage 3 unspecified: Secondary | ICD-10-CM | POA: Diagnosis not present

## 2021-04-19 DIAGNOSIS — R627 Adult failure to thrive: Secondary | ICD-10-CM | POA: Diagnosis not present

## 2021-04-19 DIAGNOSIS — I130 Hypertensive heart and chronic kidney disease with heart failure and stage 1 through stage 4 chronic kidney disease, or unspecified chronic kidney disease: Secondary | ICD-10-CM | POA: Diagnosis not present

## 2021-04-19 DIAGNOSIS — R296 Repeated falls: Secondary | ICD-10-CM | POA: Diagnosis not present

## 2021-04-19 DIAGNOSIS — G250 Essential tremor: Secondary | ICD-10-CM | POA: Diagnosis not present

## 2021-04-19 DIAGNOSIS — F0280 Dementia in other diseases classified elsewhere without behavioral disturbance: Secondary | ICD-10-CM | POA: Diagnosis not present

## 2021-04-19 DIAGNOSIS — I509 Heart failure, unspecified: Secondary | ICD-10-CM | POA: Diagnosis not present

## 2021-04-19 DIAGNOSIS — F0150 Vascular dementia without behavioral disturbance: Secondary | ICD-10-CM

## 2021-04-19 DIAGNOSIS — R269 Unspecified abnormalities of gait and mobility: Secondary | ICD-10-CM

## 2021-04-19 NOTE — Progress Notes (Signed)
Location:   [facility] Room Number: 31 Place of Service:  SNF [phone]) Provider:  Einar Crow MD  Mahlon Gammon, MD  Patient Care Team: Mahlon Gammon, MD as PCP - General (Internal Medicine) Nahser, Deloris Ping, MD as PCP - Cardiology (Cardiology) Mast, Man X, NP as Nurse Practitioner (Internal Medicine)  Extended Emergency Contact Information Primary Emergency Contact: Morgan,Tom Address: [address]          [place], [place] [address] [place] of [place] Home Phone: [phone] Relation: Friend Secondary Emergency Contact: Brandy Hale [place] of [place] Home Phone: [phone] Relation: Niece  Code Status:  DNR Hospice Goals of care: Advanced Directive information Advanced Directives 04/19/2021  Does Patient Have a Medical Advance Directive? Yes  Type of Estate agent of [place];Out of facility DNR (pink MOST or yellow form)  Does patient want to make changes to medical advance directive? No - Patient declined  Copy of Healthcare Power of Attorney in Chart? Yes - validated most recent copy scanned in chart (See row information)  Would patient like information on creating a medical advance directive? -  Pre-existing out of facility DNR order (yellow form or pink MOST form) Yellow form placed in chart (order not valid for inpatient use);Pink MOST form placed in chart (order not valid for inpatient use)     Chief Complaint  Patient presents with   Medical Management of Chronic Issues   Quality Metric Gaps    Shingrix, flu shot    HPI:  Pt is a 87 y.o. female seen today for medical management of chronic diseases.    Patient has h/o Atrial Fibrillation , LE edema, Hypertension, Dysphagia,Essential Tremor, Anemia, Dementia,and depression, Falls Weight loss Patient is a long-term resident  Continues to be stable Weight is stable No recent falls Has aphasia. Now cannot walk and stay in Geri chair Needs  helps with Feeding Enrolled in Hospice Past Medical History:  Diagnosis Date   Anticoagulant long-term use    Atrial fibrillation (HCC)    CHF (congestive heart failure) (HCC)    Chronic anticoagulation    Hypertension    Raynaud phenomenon    Tremor, essential    Past Surgical History:  Procedure Laterality Date   CHOLECYSTECTOMY     TONSILLECTOMY      Allergies  Allergen Reactions   Fosamax [Alendronate]    Other Nausea And Vomiting    Green pepper    Allergies as of 04/19/2021       Reactions   Fosamax [alendronate]    Other Nausea And Vomiting   Green pepper        Medication List        Accurate as of April 19, 2021 10:59 AM. If you have any questions, ask your nurse or doctor.          acetaminophen 500 MG tablet Commonly known as: TYLENOL Take 500 mg by mouth every 8 (eight) hours as needed for mild pain or moderate pain.   acetaminophen 500 MG tablet Commonly known as: TYLENOL Take 1,000 mg by mouth 2 (two) times daily. Not to exceed 3,00 mg daily.   magnesium hydroxide 400 MG/5ML suspension Commonly known as: MILK OF MAGNESIA Take by mouth every other day.   morphine 5 MG/ML injection Inject into the vein. 100 mg/5 mL (20 mg/mL); amt: 5 mg = 0.25 mL; oral Special Instructions: PRN pain/dyspnea/restlessness Every 4 Hours - PRN   nystatin powder Commonly known as:  MYCOSTATIN/NYSTOP Apply 1 application topically 2 (two) times daily.   sennosides-docusate sodium 8.6-50 MG tablet Commonly known as: SENOKOT-S Take 2 tablets by mouth at bedtime.        Review of Systems  Unable to perform ROS: Dementia   Immunization History  Administered Date(s) Administered   Influenza Whole 05/09/2018   Influenza, High Dose Seasonal PF 05/09/2019   Influenza-Unspecified 05/28/2017, 05/19/2020   Moderna SARS-COV2 Booster Vaccination 01/04/2021   Moderna Sars-Covid-2 Vaccination 08/09/2019, 09/06/2019, 06/15/2020   Pneumococcal Conjugate-13  07/13/2017   Pneumococcal Polysaccharide-23 04/07/2002   Tdap 07/13/2017, 10/08/2017   Pertinent  Health Maintenance Due  Topic Date Due   INFLUENZA VACCINE  03/07/2021   DEXA SCAN  Completed   PNA vac Low Risk Adult  Completed   Fall Risk  03/10/2019 10/05/2017  Falls in the past year? (No Data) No  Comment Emmi Telephone Survey: data to providers prior to load -  Number falls in past yr: (No Data) -  Comment Emmi Telephone Survey Actual Response =  -   Functional Status Survey:    Vitals:   04/19/21 1056  BP: (!) 149/89  Pulse: (!) 103  Resp: 16  Temp: 97.7 F (36.5 C)  SpO2: 93%  Weight: 103 lb 6.4 oz (46.9 kg)  Height: 5\' 4"  (1.626 m)   Body mass index is 17.75 kg/m. Physical Exam Vitals reviewed.  Constitutional:      Appearance: Normal appearance.  HENT:     Head: Normocephalic.     Mouth/Throat:     Mouth: Mucous membranes are moist.     Pharynx: Oropharynx is clear.  Eyes:     Pupils: Pupils are equal, round, and reactive to light.  Cardiovascular:     Rate and Rhythm: Normal rate. Rhythm irregular.     Pulses: Normal pulses.     Heart sounds: Normal heart sounds.  Pulmonary:     Effort: Pulmonary effort is normal.     Breath sounds: Normal breath sounds.  Abdominal:     General: Abdomen is flat. Bowel sounds are normal.  Musculoskeletal:        General: No swelling.     Cervical back: Neck supple.  Skin:    General: Skin is warm.  Neurological:     General: No focal deficit present.     Mental Status: She is alert.     Comments: Tremor present  Psychiatric:        Mood and Affect: Mood normal.        Thought Content: Thought content normal.    Labs reviewed: No results for input(s): NA, K, CL, CO2, GLUCOSE, BUN, CREATININE, CALCIUM, MG, PHOS in the last 8760 hours. No results for input(s): AST, ALT, ALKPHOS, BILITOT, PROT, ALBUMIN in the last 8760 hours. No results for input(s): WBC, NEUTROABS, HGB, HCT, MCV, PLT in the last 8760 hours. Lab  Results  Component Value Date   TSH 0.76 05/21/2019   No results found for: HGBA1C No results found for: CHOL, HDL, LDLCALC, LDLDIRECT, TRIG, CHOLHDL  Significant Diagnostic Results in last 30 days:  No results found.  Assessment/Plan Mixed Alzheimer's and vascular dementia (HCC) Enrolled in hospice Adult failure to thrive Has not tolerated Remeron Weight stable now Tremor, essential Off Propanolol due to Low BP Gait abnormality and High risk of falls No Wheelchair Dependent Chronic a-fib (HCC) Not on any Coagulation due to Falls     Family/ staff Communication:   Labs/tests ordered:

## 2021-04-20 DIAGNOSIS — G309 Alzheimer's disease, unspecified: Secondary | ICD-10-CM | POA: Diagnosis not present

## 2021-04-20 DIAGNOSIS — N183 Chronic kidney disease, stage 3 unspecified: Secondary | ICD-10-CM | POA: Diagnosis not present

## 2021-04-20 DIAGNOSIS — F0280 Dementia in other diseases classified elsewhere without behavioral disturbance: Secondary | ICD-10-CM | POA: Diagnosis not present

## 2021-04-20 DIAGNOSIS — R296 Repeated falls: Secondary | ICD-10-CM | POA: Diagnosis not present

## 2021-04-20 DIAGNOSIS — I509 Heart failure, unspecified: Secondary | ICD-10-CM | POA: Diagnosis not present

## 2021-04-20 DIAGNOSIS — I130 Hypertensive heart and chronic kidney disease with heart failure and stage 1 through stage 4 chronic kidney disease, or unspecified chronic kidney disease: Secondary | ICD-10-CM | POA: Diagnosis not present

## 2021-04-21 DIAGNOSIS — I130 Hypertensive heart and chronic kidney disease with heart failure and stage 1 through stage 4 chronic kidney disease, or unspecified chronic kidney disease: Secondary | ICD-10-CM | POA: Diagnosis not present

## 2021-04-21 DIAGNOSIS — G309 Alzheimer's disease, unspecified: Secondary | ICD-10-CM | POA: Diagnosis not present

## 2021-04-21 DIAGNOSIS — I509 Heart failure, unspecified: Secondary | ICD-10-CM | POA: Diagnosis not present

## 2021-04-21 DIAGNOSIS — N183 Chronic kidney disease, stage 3 unspecified: Secondary | ICD-10-CM | POA: Diagnosis not present

## 2021-04-21 DIAGNOSIS — F0280 Dementia in other diseases classified elsewhere without behavioral disturbance: Secondary | ICD-10-CM | POA: Diagnosis not present

## 2021-04-21 DIAGNOSIS — R296 Repeated falls: Secondary | ICD-10-CM | POA: Diagnosis not present

## 2021-04-26 DIAGNOSIS — N183 Chronic kidney disease, stage 3 unspecified: Secondary | ICD-10-CM | POA: Diagnosis not present

## 2021-04-26 DIAGNOSIS — G309 Alzheimer's disease, unspecified: Secondary | ICD-10-CM | POA: Diagnosis not present

## 2021-04-26 DIAGNOSIS — R296 Repeated falls: Secondary | ICD-10-CM | POA: Diagnosis not present

## 2021-04-26 DIAGNOSIS — F0280 Dementia in other diseases classified elsewhere without behavioral disturbance: Secondary | ICD-10-CM | POA: Diagnosis not present

## 2021-04-26 DIAGNOSIS — I509 Heart failure, unspecified: Secondary | ICD-10-CM | POA: Diagnosis not present

## 2021-04-26 DIAGNOSIS — Z23 Encounter for immunization: Secondary | ICD-10-CM | POA: Diagnosis not present

## 2021-04-26 DIAGNOSIS — I130 Hypertensive heart and chronic kidney disease with heart failure and stage 1 through stage 4 chronic kidney disease, or unspecified chronic kidney disease: Secondary | ICD-10-CM | POA: Diagnosis not present

## 2021-04-28 DIAGNOSIS — N183 Chronic kidney disease, stage 3 unspecified: Secondary | ICD-10-CM | POA: Diagnosis not present

## 2021-04-28 DIAGNOSIS — F0280 Dementia in other diseases classified elsewhere without behavioral disturbance: Secondary | ICD-10-CM | POA: Diagnosis not present

## 2021-04-28 DIAGNOSIS — G309 Alzheimer's disease, unspecified: Secondary | ICD-10-CM | POA: Diagnosis not present

## 2021-04-28 DIAGNOSIS — I130 Hypertensive heart and chronic kidney disease with heart failure and stage 1 through stage 4 chronic kidney disease, or unspecified chronic kidney disease: Secondary | ICD-10-CM | POA: Diagnosis not present

## 2021-04-28 DIAGNOSIS — I509 Heart failure, unspecified: Secondary | ICD-10-CM | POA: Diagnosis not present

## 2021-04-28 DIAGNOSIS — R296 Repeated falls: Secondary | ICD-10-CM | POA: Diagnosis not present

## 2021-05-03 DIAGNOSIS — F0280 Dementia in other diseases classified elsewhere without behavioral disturbance: Secondary | ICD-10-CM | POA: Diagnosis not present

## 2021-05-03 DIAGNOSIS — N183 Chronic kidney disease, stage 3 unspecified: Secondary | ICD-10-CM | POA: Diagnosis not present

## 2021-05-03 DIAGNOSIS — I130 Hypertensive heart and chronic kidney disease with heart failure and stage 1 through stage 4 chronic kidney disease, or unspecified chronic kidney disease: Secondary | ICD-10-CM | POA: Diagnosis not present

## 2021-05-03 DIAGNOSIS — G309 Alzheimer's disease, unspecified: Secondary | ICD-10-CM | POA: Diagnosis not present

## 2021-05-03 DIAGNOSIS — R296 Repeated falls: Secondary | ICD-10-CM | POA: Diagnosis not present

## 2021-05-03 DIAGNOSIS — I509 Heart failure, unspecified: Secondary | ICD-10-CM | POA: Diagnosis not present

## 2021-05-07 DIAGNOSIS — F0283 Dementia in other diseases classified elsewhere, unspecified severity, with mood disturbance: Secondary | ICD-10-CM | POA: Diagnosis not present

## 2021-05-07 DIAGNOSIS — G309 Alzheimer's disease, unspecified: Secondary | ICD-10-CM | POA: Diagnosis not present

## 2021-05-07 DIAGNOSIS — Z993 Dependence on wheelchair: Secondary | ICD-10-CM | POA: Diagnosis not present

## 2021-05-07 DIAGNOSIS — R159 Full incontinence of feces: Secondary | ICD-10-CM | POA: Diagnosis not present

## 2021-05-07 DIAGNOSIS — I509 Heart failure, unspecified: Secondary | ICD-10-CM | POA: Diagnosis not present

## 2021-05-07 DIAGNOSIS — E46 Unspecified protein-calorie malnutrition: Secondary | ICD-10-CM | POA: Diagnosis not present

## 2021-05-07 DIAGNOSIS — I4891 Unspecified atrial fibrillation: Secondary | ICD-10-CM | POA: Diagnosis not present

## 2021-05-07 DIAGNOSIS — R32 Unspecified urinary incontinence: Secondary | ICD-10-CM | POA: Diagnosis not present

## 2021-05-07 DIAGNOSIS — F0284 Dementia in other diseases classified elsewhere, unspecified severity, with anxiety: Secondary | ICD-10-CM | POA: Diagnosis not present

## 2021-05-07 DIAGNOSIS — K5909 Other constipation: Secondary | ICD-10-CM | POA: Diagnosis not present

## 2021-05-07 DIAGNOSIS — G250 Essential tremor: Secondary | ICD-10-CM | POA: Diagnosis not present

## 2021-05-07 DIAGNOSIS — Z947 Corneal transplant status: Secondary | ICD-10-CM | POA: Diagnosis not present

## 2021-05-07 DIAGNOSIS — F02811 Dementia in other diseases classified elsewhere, unspecified severity, with agitation: Secondary | ICD-10-CM | POA: Diagnosis not present

## 2021-05-07 DIAGNOSIS — Z681 Body mass index (BMI) 19 or less, adult: Secondary | ICD-10-CM | POA: Diagnosis not present

## 2021-05-07 DIAGNOSIS — R296 Repeated falls: Secondary | ICD-10-CM | POA: Diagnosis not present

## 2021-05-07 DIAGNOSIS — H01003 Unspecified blepharitis right eye, unspecified eyelid: Secondary | ICD-10-CM | POA: Diagnosis not present

## 2021-05-07 DIAGNOSIS — S22080D Wedge compression fracture of T11-T12 vertebra, subsequent encounter for fracture with routine healing: Secondary | ICD-10-CM | POA: Diagnosis not present

## 2021-05-07 DIAGNOSIS — D631 Anemia in chronic kidney disease: Secondary | ICD-10-CM | POA: Diagnosis not present

## 2021-05-07 DIAGNOSIS — Z87440 Personal history of urinary (tract) infections: Secondary | ICD-10-CM | POA: Diagnosis not present

## 2021-05-07 DIAGNOSIS — N183 Chronic kidney disease, stage 3 unspecified: Secondary | ICD-10-CM | POA: Diagnosis not present

## 2021-05-07 DIAGNOSIS — I130 Hypertensive heart and chronic kidney disease with heart failure and stage 1 through stage 4 chronic kidney disease, or unspecified chronic kidney disease: Secondary | ICD-10-CM | POA: Diagnosis not present

## 2021-05-07 DIAGNOSIS — I7300 Raynaud's syndrome without gangrene: Secondary | ICD-10-CM | POA: Diagnosis not present

## 2021-05-07 DIAGNOSIS — Z741 Need for assistance with personal care: Secondary | ICD-10-CM | POA: Diagnosis not present

## 2021-05-09 DIAGNOSIS — N183 Chronic kidney disease, stage 3 unspecified: Secondary | ICD-10-CM | POA: Diagnosis not present

## 2021-05-09 DIAGNOSIS — F0283 Dementia in other diseases classified elsewhere, unspecified severity, with mood disturbance: Secondary | ICD-10-CM | POA: Diagnosis not present

## 2021-05-09 DIAGNOSIS — I509 Heart failure, unspecified: Secondary | ICD-10-CM | POA: Diagnosis not present

## 2021-05-09 DIAGNOSIS — I130 Hypertensive heart and chronic kidney disease with heart failure and stage 1 through stage 4 chronic kidney disease, or unspecified chronic kidney disease: Secondary | ICD-10-CM | POA: Diagnosis not present

## 2021-05-09 DIAGNOSIS — G309 Alzheimer's disease, unspecified: Secondary | ICD-10-CM | POA: Diagnosis not present

## 2021-05-09 DIAGNOSIS — R296 Repeated falls: Secondary | ICD-10-CM | POA: Diagnosis not present

## 2021-05-10 DIAGNOSIS — G309 Alzheimer's disease, unspecified: Secondary | ICD-10-CM | POA: Diagnosis not present

## 2021-05-10 DIAGNOSIS — N183 Chronic kidney disease, stage 3 unspecified: Secondary | ICD-10-CM | POA: Diagnosis not present

## 2021-05-10 DIAGNOSIS — I130 Hypertensive heart and chronic kidney disease with heart failure and stage 1 through stage 4 chronic kidney disease, or unspecified chronic kidney disease: Secondary | ICD-10-CM | POA: Diagnosis not present

## 2021-05-10 DIAGNOSIS — I509 Heart failure, unspecified: Secondary | ICD-10-CM | POA: Diagnosis not present

## 2021-05-10 DIAGNOSIS — R296 Repeated falls: Secondary | ICD-10-CM | POA: Diagnosis not present

## 2021-05-10 DIAGNOSIS — F0283 Dementia in other diseases classified elsewhere, unspecified severity, with mood disturbance: Secondary | ICD-10-CM | POA: Diagnosis not present

## 2021-05-12 DIAGNOSIS — G309 Alzheimer's disease, unspecified: Secondary | ICD-10-CM | POA: Diagnosis not present

## 2021-05-12 DIAGNOSIS — I509 Heart failure, unspecified: Secondary | ICD-10-CM | POA: Diagnosis not present

## 2021-05-12 DIAGNOSIS — N183 Chronic kidney disease, stage 3 unspecified: Secondary | ICD-10-CM | POA: Diagnosis not present

## 2021-05-12 DIAGNOSIS — R296 Repeated falls: Secondary | ICD-10-CM | POA: Diagnosis not present

## 2021-05-12 DIAGNOSIS — F0283 Dementia in other diseases classified elsewhere, unspecified severity, with mood disturbance: Secondary | ICD-10-CM | POA: Diagnosis not present

## 2021-05-12 DIAGNOSIS — I130 Hypertensive heart and chronic kidney disease with heart failure and stage 1 through stage 4 chronic kidney disease, or unspecified chronic kidney disease: Secondary | ICD-10-CM | POA: Diagnosis not present

## 2021-05-13 ENCOUNTER — Encounter: Payer: Self-pay | Admitting: Nurse Practitioner

## 2021-05-13 ENCOUNTER — Non-Acute Institutional Stay (SKILLED_NURSING_FACILITY): Payer: Medicare Other | Admitting: Nurse Practitioner

## 2021-05-13 DIAGNOSIS — F0280 Dementia in other diseases classified elsewhere without behavioral disturbance: Secondary | ICD-10-CM

## 2021-05-13 DIAGNOSIS — H0100B Unspecified blepharitis left eye, upper and lower eyelids: Secondary | ICD-10-CM

## 2021-05-13 DIAGNOSIS — H0100A Unspecified blepharitis right eye, upper and lower eyelids: Secondary | ICD-10-CM | POA: Diagnosis not present

## 2021-05-13 DIAGNOSIS — M545 Low back pain, unspecified: Secondary | ICD-10-CM | POA: Diagnosis not present

## 2021-05-13 DIAGNOSIS — F339 Major depressive disorder, recurrent, unspecified: Secondary | ICD-10-CM | POA: Diagnosis not present

## 2021-05-13 DIAGNOSIS — G309 Alzheimer's disease, unspecified: Secondary | ICD-10-CM | POA: Diagnosis not present

## 2021-05-13 DIAGNOSIS — R627 Adult failure to thrive: Secondary | ICD-10-CM | POA: Diagnosis not present

## 2021-05-13 DIAGNOSIS — I482 Chronic atrial fibrillation, unspecified: Secondary | ICD-10-CM | POA: Diagnosis not present

## 2021-05-13 DIAGNOSIS — G250 Essential tremor: Secondary | ICD-10-CM

## 2021-05-13 DIAGNOSIS — F0150 Vascular dementia without behavioral disturbance: Secondary | ICD-10-CM | POA: Diagnosis not present

## 2021-05-13 DIAGNOSIS — K5901 Slow transit constipation: Secondary | ICD-10-CM | POA: Diagnosis not present

## 2021-05-13 NOTE — Progress Notes (Signed)
Location:   [facility] Room Number: 1-A Place of Service:  SNF (31) Provider:  Ethyn Schetter, NP    Patient Care Team: Mahlon Gammon, MD as PCP - General (Internal Medicine) Nahser, Deloris Ping, MD as PCP - Cardiology (Cardiology) Brihanna Devenport X, NP as Nurse Practitioner (Internal Medicine)  Extended Emergency Contact Information Primary Emergency Contact: Morgan,Tom Address: [address]          [place], [place] [address] [place] of [place] Home Phone: [phone] Relation: Friend Secondary Emergency Contact: Brandy Hale [place] of [place] Home Phone: [phone] Relation: Niece  Code Status:  DNR Goals of care: Advanced Directive information Advanced Directives 05/13/2021  Does Patient Have a Medical Advance Directive? Yes  Type of Estate agent of [place];Out of facility DNR (pink MOST or yellow form)  Does patient want to make changes to medical advance directive? No - Patient declined  Copy of Healthcare Power of Attorney in Chart? Yes - validated most recent copy scanned in chart (See row information)  Would patient like information on creating a medical advance directive? -  Pre-existing out of facility DNR order (yellow form or pink MOST form) -     Chief Complaint  Patient presents with   Medical Management of Chronic Issues    Routine Visit.   Immunizations    Discuss the need for Shingrix vaccine, and Influenza vaccine.    HPI:  Pt is a 87 y.o. female seen today for medical management of chronic diseases.     Advanced dementia, no safety awareness, total care of ADLs             FTT, supportive care, under Hospice service.              Essential tremor, dependent of ADLs, off  Propranolol             Anxiety/depression, able to be redirected with activities,             Chronic lower back pain, takes Tylenol, Morphine             Constipation, takes Senokot S II qhs, MOM qod.                Blepharitis bil, chronic.             Afib, heart rate is in control, not on rate control agent or anticoagulation.  Past Medical History:  Diagnosis Date   Anticoagulant long-term use    Atrial fibrillation (HCC)    CHF (congestive heart failure) (HCC)    Chronic anticoagulation    Hypertension    Raynaud phenomenon    Tremor, essential    Past Surgical History:  Procedure Laterality Date   CHOLECYSTECTOMY     TONSILLECTOMY      Allergies  Allergen Reactions   Fosamax [Alendronate]    Other Nausea And Vomiting    Green pepper    Allergies as of 05/13/2021       Reactions   Fosamax [alendronate]    Other Nausea And Vomiting   Green pepper        Medication List        Accurate as of May 13, 2021 11:59 PM. If you have any questions, ask your nurse or doctor.          acetaminophen 500 MG tablet Commonly known as: TYLENOL Take 500 mg by mouth every 8 (eight) hours as needed for mild pain or  moderate pain.   acetaminophen 500 MG tablet Commonly known as: TYLENOL Take 1,000 mg by mouth 2 (two) times daily. Not to exceed 3,00 mg daily.   magnesium hydroxide 400 MG/5ML suspension Commonly known as: MILK OF MAGNESIA Take by mouth every other day.   morphine 5 MG/ML injection Inject into the vein. 100 mg/5 mL (20 mg/mL); amt: 5 mg = 0.25 mL; oral Special Instructions: PRN pain/dyspnea/restlessness Every 4 Hours - PRN   nystatin powder Commonly known as: MYCOSTATIN/NYSTOP Apply 1 application topically 2 (two) times daily.   sennosides-docusate sodium 8.6-50 MG tablet Commonly known as: SENOKOT-S Take 2 tablets by mouth at bedtime.        Review of Systems  Unable to perform ROS: Dementia   Immunization History  Administered Date(s) Administered   Influenza Whole 05/09/2018   Influenza, High Dose Seasonal PF 05/09/2019   Influenza-Unspecified 05/28/2017, 05/19/2020   Moderna SARS-COV2 Booster Vaccination 01/04/2021   Moderna Sars-Covid-2  Vaccination 08/09/2019, 09/06/2019, 06/15/2020   Pneumococcal Conjugate-13 07/13/2017   Pneumococcal Polysaccharide-23 04/07/2002   Tdap 07/13/2017, 10/08/2017   Pertinent  Health Maintenance Due  Topic Date Due   INFLUENZA VACCINE  03/07/2021   DEXA SCAN  Completed   Fall Risk  03/10/2019 10/05/2017  Falls in the past year? (No Data) No  Comment Emmi Telephone Survey: data to providers prior to load -  Number falls in past yr: (No Data) -  Comment Emmi Telephone Survey Actual Response =  -   Functional Status Survey:    Vitals:   05/13/21 1316  BP: 140/80  Pulse: 60  Resp: 16  Temp: 97.6 F (36.4 C)  SpO2: 94%  Weight: 99 lb 3.2 oz (45 kg)  Height: 5\' 4"  (1.626 m)   Body mass index is 17.03 kg/m. Physical Exam Vitals and nursing note reviewed.  Constitutional:      Appearance: Normal appearance.     Comments: frail  HENT:     Head: Normocephalic and atraumatic.     Nose: Nose normal.     Mouth/Throat:     Mouth: Mucous membranes are moist.  Eyes:     Extraocular Movements: Extraocular movements intact.     Conjunctiva/sclera: Conjunctivae normal.     Pupils: Pupils are equal, round, and reactive to light.  Cardiovascular:     Rate and Rhythm: Normal rate. Rhythm irregular.     Heart sounds: No murmur heard. Pulmonary:     Breath sounds: No rales.  Abdominal:     Palpations: Abdomen is soft.     Tenderness: There is no abdominal tenderness.  Musculoskeletal:     Cervical back: Normal range of motion and neck supple.     Right lower leg: No edema.     Left lower leg: No edema.  Skin:    General: Skin is warm and dry.  Neurological:     General: No focal deficit present.     Mental Status: She is alert. Mental status is at baseline.     Coordination: Coordination abnormal.     Gait: Gait abnormal.     Comments: Tremor in fingers are worsened. Oriented to self.   Psychiatric:     Comments: Confused, but smiled when she talked to.     Labs reviewed: No  results for input(s): NA, K, CL, CO2, GLUCOSE, BUN, CREATININE, CALCIUM, MG, PHOS in the last 8760 hours. No results for input(s): AST, ALT, ALKPHOS, BILITOT, PROT, ALBUMIN in the last 8760 hours. No results for input(s): WBC,  NEUTROABS, HGB, HCT, MCV, PLT in the last 8760 hours. Lab Results  Component Value Date   TSH 0.76 05/21/2019   No results found for: HGBA1C No results found for: CHOL, HDL, LDLCALC, LDLDIRECT, TRIG, CHOLHDL  Significant Diagnostic Results in last 30 days:  No results found.  Assessment/Plan Lower back pain Managing pain,  takes Tylenol, Morphine  Slow transit constipation Stable,  takes Senokot S II qhs, MOM qod.   Blepharitis of both eyes Chronic, assist the patient cleansing eyelashes/lids daily.   Chronic a-fib (HCC) heart rate is in control, not on rate control agent or anticoagulation.  Depression, recurrent (HCC) able to be redirected with activities,  Tremor, essential dependent of ADLs, off  Propranolol  Adult failure to thrive supportive care, under Hospice service.   Mixed Alzheimer's and vascular dementia (HCC) no safety awareness, total care of ADLs    Family/ staff Communication: plan of care reviewed with the patient and charge nurse.   Labs/tests ordered:  none  Time spend 25 minutes.

## 2021-05-16 ENCOUNTER — Encounter: Payer: Self-pay | Admitting: Nurse Practitioner

## 2021-05-16 DIAGNOSIS — R296 Repeated falls: Secondary | ICD-10-CM | POA: Diagnosis not present

## 2021-05-16 DIAGNOSIS — I509 Heart failure, unspecified: Secondary | ICD-10-CM | POA: Diagnosis not present

## 2021-05-16 DIAGNOSIS — I130 Hypertensive heart and chronic kidney disease with heart failure and stage 1 through stage 4 chronic kidney disease, or unspecified chronic kidney disease: Secondary | ICD-10-CM | POA: Diagnosis not present

## 2021-05-16 DIAGNOSIS — G309 Alzheimer's disease, unspecified: Secondary | ICD-10-CM | POA: Diagnosis not present

## 2021-05-16 DIAGNOSIS — F0283 Dementia in other diseases classified elsewhere, unspecified severity, with mood disturbance: Secondary | ICD-10-CM | POA: Diagnosis not present

## 2021-05-16 DIAGNOSIS — N183 Chronic kidney disease, stage 3 unspecified: Secondary | ICD-10-CM | POA: Diagnosis not present

## 2021-05-16 NOTE — Assessment & Plan Note (Signed)
dependent of ADLs, off  Propranolol

## 2021-05-16 NOTE — Assessment & Plan Note (Signed)
Managing pain,  takes Tylenol, Morphine

## 2021-05-16 NOTE — Assessment & Plan Note (Signed)
supportive care, under Hospice service.

## 2021-05-16 NOTE — Assessment & Plan Note (Signed)
Stable, takes Senokot S II qhs, MOM qod.  

## 2021-05-16 NOTE — Assessment & Plan Note (Signed)
no safety awareness, total care of ADLs

## 2021-05-16 NOTE — Assessment & Plan Note (Signed)
able to be redirected with activities,

## 2021-05-16 NOTE — Assessment & Plan Note (Signed)
Chronic, assist the patient cleansing eyelashes/lids daily.

## 2021-05-16 NOTE — Assessment & Plan Note (Signed)
heart rate is in control, not on rate control agent or anticoagulation.

## 2021-05-17 DIAGNOSIS — R296 Repeated falls: Secondary | ICD-10-CM | POA: Diagnosis not present

## 2021-05-17 DIAGNOSIS — I509 Heart failure, unspecified: Secondary | ICD-10-CM | POA: Diagnosis not present

## 2021-05-17 DIAGNOSIS — G309 Alzheimer's disease, unspecified: Secondary | ICD-10-CM | POA: Diagnosis not present

## 2021-05-17 DIAGNOSIS — F0283 Dementia in other diseases classified elsewhere, unspecified severity, with mood disturbance: Secondary | ICD-10-CM | POA: Diagnosis not present

## 2021-05-17 DIAGNOSIS — N183 Chronic kidney disease, stage 3 unspecified: Secondary | ICD-10-CM | POA: Diagnosis not present

## 2021-05-17 DIAGNOSIS — I130 Hypertensive heart and chronic kidney disease with heart failure and stage 1 through stage 4 chronic kidney disease, or unspecified chronic kidney disease: Secondary | ICD-10-CM | POA: Diagnosis not present

## 2021-05-18 DIAGNOSIS — I509 Heart failure, unspecified: Secondary | ICD-10-CM | POA: Diagnosis not present

## 2021-05-18 DIAGNOSIS — N183 Chronic kidney disease, stage 3 unspecified: Secondary | ICD-10-CM | POA: Diagnosis not present

## 2021-05-18 DIAGNOSIS — F0283 Dementia in other diseases classified elsewhere, unspecified severity, with mood disturbance: Secondary | ICD-10-CM | POA: Diagnosis not present

## 2021-05-18 DIAGNOSIS — G309 Alzheimer's disease, unspecified: Secondary | ICD-10-CM | POA: Diagnosis not present

## 2021-05-18 DIAGNOSIS — I130 Hypertensive heart and chronic kidney disease with heart failure and stage 1 through stage 4 chronic kidney disease, or unspecified chronic kidney disease: Secondary | ICD-10-CM | POA: Diagnosis not present

## 2021-05-18 DIAGNOSIS — R296 Repeated falls: Secondary | ICD-10-CM | POA: Diagnosis not present

## 2021-05-19 DIAGNOSIS — F0283 Dementia in other diseases classified elsewhere, unspecified severity, with mood disturbance: Secondary | ICD-10-CM | POA: Diagnosis not present

## 2021-05-19 DIAGNOSIS — I130 Hypertensive heart and chronic kidney disease with heart failure and stage 1 through stage 4 chronic kidney disease, or unspecified chronic kidney disease: Secondary | ICD-10-CM | POA: Diagnosis not present

## 2021-05-19 DIAGNOSIS — I509 Heart failure, unspecified: Secondary | ICD-10-CM | POA: Diagnosis not present

## 2021-05-19 DIAGNOSIS — N183 Chronic kidney disease, stage 3 unspecified: Secondary | ICD-10-CM | POA: Diagnosis not present

## 2021-05-19 DIAGNOSIS — R296 Repeated falls: Secondary | ICD-10-CM | POA: Diagnosis not present

## 2021-05-19 DIAGNOSIS — G309 Alzheimer's disease, unspecified: Secondary | ICD-10-CM | POA: Diagnosis not present

## 2021-05-23 DIAGNOSIS — G309 Alzheimer's disease, unspecified: Secondary | ICD-10-CM | POA: Diagnosis not present

## 2021-05-23 DIAGNOSIS — I130 Hypertensive heart and chronic kidney disease with heart failure and stage 1 through stage 4 chronic kidney disease, or unspecified chronic kidney disease: Secondary | ICD-10-CM | POA: Diagnosis not present

## 2021-05-23 DIAGNOSIS — F0283 Dementia in other diseases classified elsewhere, unspecified severity, with mood disturbance: Secondary | ICD-10-CM | POA: Diagnosis not present

## 2021-05-23 DIAGNOSIS — R296 Repeated falls: Secondary | ICD-10-CM | POA: Diagnosis not present

## 2021-05-23 DIAGNOSIS — N183 Chronic kidney disease, stage 3 unspecified: Secondary | ICD-10-CM | POA: Diagnosis not present

## 2021-05-23 DIAGNOSIS — I509 Heart failure, unspecified: Secondary | ICD-10-CM | POA: Diagnosis not present

## 2021-05-24 DIAGNOSIS — I509 Heart failure, unspecified: Secondary | ICD-10-CM | POA: Diagnosis not present

## 2021-05-24 DIAGNOSIS — I130 Hypertensive heart and chronic kidney disease with heart failure and stage 1 through stage 4 chronic kidney disease, or unspecified chronic kidney disease: Secondary | ICD-10-CM | POA: Diagnosis not present

## 2021-05-24 DIAGNOSIS — N183 Chronic kidney disease, stage 3 unspecified: Secondary | ICD-10-CM | POA: Diagnosis not present

## 2021-05-24 DIAGNOSIS — F0283 Dementia in other diseases classified elsewhere, unspecified severity, with mood disturbance: Secondary | ICD-10-CM | POA: Diagnosis not present

## 2021-05-24 DIAGNOSIS — G309 Alzheimer's disease, unspecified: Secondary | ICD-10-CM | POA: Diagnosis not present

## 2021-05-24 DIAGNOSIS — R296 Repeated falls: Secondary | ICD-10-CM | POA: Diagnosis not present

## 2021-05-25 DIAGNOSIS — I509 Heart failure, unspecified: Secondary | ICD-10-CM | POA: Diagnosis not present

## 2021-05-25 DIAGNOSIS — G309 Alzheimer's disease, unspecified: Secondary | ICD-10-CM | POA: Diagnosis not present

## 2021-05-25 DIAGNOSIS — F0283 Dementia in other diseases classified elsewhere, unspecified severity, with mood disturbance: Secondary | ICD-10-CM | POA: Diagnosis not present

## 2021-05-25 DIAGNOSIS — R296 Repeated falls: Secondary | ICD-10-CM | POA: Diagnosis not present

## 2021-05-25 DIAGNOSIS — I130 Hypertensive heart and chronic kidney disease with heart failure and stage 1 through stage 4 chronic kidney disease, or unspecified chronic kidney disease: Secondary | ICD-10-CM | POA: Diagnosis not present

## 2021-05-25 DIAGNOSIS — N183 Chronic kidney disease, stage 3 unspecified: Secondary | ICD-10-CM | POA: Diagnosis not present

## 2021-05-26 DIAGNOSIS — I130 Hypertensive heart and chronic kidney disease with heart failure and stage 1 through stage 4 chronic kidney disease, or unspecified chronic kidney disease: Secondary | ICD-10-CM | POA: Diagnosis not present

## 2021-05-26 DIAGNOSIS — I509 Heart failure, unspecified: Secondary | ICD-10-CM | POA: Diagnosis not present

## 2021-05-26 DIAGNOSIS — F0283 Dementia in other diseases classified elsewhere, unspecified severity, with mood disturbance: Secondary | ICD-10-CM | POA: Diagnosis not present

## 2021-05-26 DIAGNOSIS — G309 Alzheimer's disease, unspecified: Secondary | ICD-10-CM | POA: Diagnosis not present

## 2021-05-26 DIAGNOSIS — R296 Repeated falls: Secondary | ICD-10-CM | POA: Diagnosis not present

## 2021-05-26 DIAGNOSIS — N183 Chronic kidney disease, stage 3 unspecified: Secondary | ICD-10-CM | POA: Diagnosis not present

## 2021-05-30 DIAGNOSIS — R296 Repeated falls: Secondary | ICD-10-CM | POA: Diagnosis not present

## 2021-05-30 DIAGNOSIS — N183 Chronic kidney disease, stage 3 unspecified: Secondary | ICD-10-CM | POA: Diagnosis not present

## 2021-05-30 DIAGNOSIS — G309 Alzheimer's disease, unspecified: Secondary | ICD-10-CM | POA: Diagnosis not present

## 2021-05-30 DIAGNOSIS — I509 Heart failure, unspecified: Secondary | ICD-10-CM | POA: Diagnosis not present

## 2021-05-30 DIAGNOSIS — I130 Hypertensive heart and chronic kidney disease with heart failure and stage 1 through stage 4 chronic kidney disease, or unspecified chronic kidney disease: Secondary | ICD-10-CM | POA: Diagnosis not present

## 2021-05-30 DIAGNOSIS — F0283 Dementia in other diseases classified elsewhere, unspecified severity, with mood disturbance: Secondary | ICD-10-CM | POA: Diagnosis not present

## 2021-05-31 DIAGNOSIS — I509 Heart failure, unspecified: Secondary | ICD-10-CM | POA: Diagnosis not present

## 2021-05-31 DIAGNOSIS — I130 Hypertensive heart and chronic kidney disease with heart failure and stage 1 through stage 4 chronic kidney disease, or unspecified chronic kidney disease: Secondary | ICD-10-CM | POA: Diagnosis not present

## 2021-05-31 DIAGNOSIS — R296 Repeated falls: Secondary | ICD-10-CM | POA: Diagnosis not present

## 2021-05-31 DIAGNOSIS — N183 Chronic kidney disease, stage 3 unspecified: Secondary | ICD-10-CM | POA: Diagnosis not present

## 2021-05-31 DIAGNOSIS — F0283 Dementia in other diseases classified elsewhere, unspecified severity, with mood disturbance: Secondary | ICD-10-CM | POA: Diagnosis not present

## 2021-05-31 DIAGNOSIS — G309 Alzheimer's disease, unspecified: Secondary | ICD-10-CM | POA: Diagnosis not present

## 2021-06-02 DIAGNOSIS — R296 Repeated falls: Secondary | ICD-10-CM | POA: Diagnosis not present

## 2021-06-02 DIAGNOSIS — G309 Alzheimer's disease, unspecified: Secondary | ICD-10-CM | POA: Diagnosis not present

## 2021-06-02 DIAGNOSIS — N183 Chronic kidney disease, stage 3 unspecified: Secondary | ICD-10-CM | POA: Diagnosis not present

## 2021-06-02 DIAGNOSIS — I509 Heart failure, unspecified: Secondary | ICD-10-CM | POA: Diagnosis not present

## 2021-06-02 DIAGNOSIS — F0283 Dementia in other diseases classified elsewhere, unspecified severity, with mood disturbance: Secondary | ICD-10-CM | POA: Diagnosis not present

## 2021-06-02 DIAGNOSIS — I130 Hypertensive heart and chronic kidney disease with heart failure and stage 1 through stage 4 chronic kidney disease, or unspecified chronic kidney disease: Secondary | ICD-10-CM | POA: Diagnosis not present

## 2021-06-06 DIAGNOSIS — R296 Repeated falls: Secondary | ICD-10-CM | POA: Diagnosis not present

## 2021-06-06 DIAGNOSIS — G309 Alzheimer's disease, unspecified: Secondary | ICD-10-CM | POA: Diagnosis not present

## 2021-06-06 DIAGNOSIS — N183 Chronic kidney disease, stage 3 unspecified: Secondary | ICD-10-CM | POA: Diagnosis not present

## 2021-06-06 DIAGNOSIS — I130 Hypertensive heart and chronic kidney disease with heart failure and stage 1 through stage 4 chronic kidney disease, or unspecified chronic kidney disease: Secondary | ICD-10-CM | POA: Diagnosis not present

## 2021-06-06 DIAGNOSIS — I509 Heart failure, unspecified: Secondary | ICD-10-CM | POA: Diagnosis not present

## 2021-06-06 DIAGNOSIS — F0283 Dementia in other diseases classified elsewhere, unspecified severity, with mood disturbance: Secondary | ICD-10-CM | POA: Diagnosis not present

## 2021-06-07 DIAGNOSIS — I4891 Unspecified atrial fibrillation: Secondary | ICD-10-CM | POA: Diagnosis not present

## 2021-06-07 DIAGNOSIS — M24561 Contracture, right knee: Secondary | ICD-10-CM | POA: Diagnosis not present

## 2021-06-07 DIAGNOSIS — F0284 Dementia in other diseases classified elsewhere, unspecified severity, with anxiety: Secondary | ICD-10-CM | POA: Diagnosis not present

## 2021-06-07 DIAGNOSIS — G309 Alzheimer's disease, unspecified: Secondary | ICD-10-CM | POA: Diagnosis not present

## 2021-06-07 DIAGNOSIS — Z947 Corneal transplant status: Secondary | ICD-10-CM | POA: Diagnosis not present

## 2021-06-07 DIAGNOSIS — Z993 Dependence on wheelchair: Secondary | ICD-10-CM | POA: Diagnosis not present

## 2021-06-07 DIAGNOSIS — Z741 Need for assistance with personal care: Secondary | ICD-10-CM | POA: Diagnosis not present

## 2021-06-07 DIAGNOSIS — F02811 Dementia in other diseases classified elsewhere, unspecified severity, with agitation: Secondary | ICD-10-CM | POA: Diagnosis not present

## 2021-06-07 DIAGNOSIS — Z681 Body mass index (BMI) 19 or less, adult: Secondary | ICD-10-CM | POA: Diagnosis not present

## 2021-06-07 DIAGNOSIS — F0283 Dementia in other diseases classified elsewhere, unspecified severity, with mood disturbance: Secondary | ICD-10-CM | POA: Diagnosis not present

## 2021-06-07 DIAGNOSIS — R159 Full incontinence of feces: Secondary | ICD-10-CM | POA: Diagnosis not present

## 2021-06-07 DIAGNOSIS — H01003 Unspecified blepharitis right eye, unspecified eyelid: Secondary | ICD-10-CM | POA: Diagnosis not present

## 2021-06-07 DIAGNOSIS — R32 Unspecified urinary incontinence: Secondary | ICD-10-CM | POA: Diagnosis not present

## 2021-06-07 DIAGNOSIS — S22080D Wedge compression fracture of T11-T12 vertebra, subsequent encounter for fracture with routine healing: Secondary | ICD-10-CM | POA: Diagnosis not present

## 2021-06-07 DIAGNOSIS — M24562 Contracture, left knee: Secondary | ICD-10-CM | POA: Diagnosis not present

## 2021-06-07 DIAGNOSIS — E46 Unspecified protein-calorie malnutrition: Secondary | ICD-10-CM | POA: Diagnosis not present

## 2021-06-07 DIAGNOSIS — D631 Anemia in chronic kidney disease: Secondary | ICD-10-CM | POA: Diagnosis not present

## 2021-06-07 DIAGNOSIS — Z87440 Personal history of urinary (tract) infections: Secondary | ICD-10-CM | POA: Diagnosis not present

## 2021-06-07 DIAGNOSIS — G250 Essential tremor: Secondary | ICD-10-CM | POA: Diagnosis not present

## 2021-06-07 DIAGNOSIS — I7300 Raynaud's syndrome without gangrene: Secondary | ICD-10-CM | POA: Diagnosis not present

## 2021-06-07 DIAGNOSIS — K5909 Other constipation: Secondary | ICD-10-CM | POA: Diagnosis not present

## 2021-06-07 DIAGNOSIS — I130 Hypertensive heart and chronic kidney disease with heart failure and stage 1 through stage 4 chronic kidney disease, or unspecified chronic kidney disease: Secondary | ICD-10-CM | POA: Diagnosis not present

## 2021-06-07 DIAGNOSIS — N183 Chronic kidney disease, stage 3 unspecified: Secondary | ICD-10-CM | POA: Diagnosis not present

## 2021-06-07 DIAGNOSIS — R296 Repeated falls: Secondary | ICD-10-CM | POA: Diagnosis not present

## 2021-06-07 DIAGNOSIS — I509 Heart failure, unspecified: Secondary | ICD-10-CM | POA: Diagnosis not present

## 2021-06-09 ENCOUNTER — Non-Acute Institutional Stay (SKILLED_NURSING_FACILITY): Payer: Medicare Other | Admitting: Nurse Practitioner

## 2021-06-09 ENCOUNTER — Encounter: Payer: Self-pay | Admitting: Nurse Practitioner

## 2021-06-09 DIAGNOSIS — G309 Alzheimer's disease, unspecified: Secondary | ICD-10-CM

## 2021-06-09 DIAGNOSIS — M545 Low back pain, unspecified: Secondary | ICD-10-CM | POA: Diagnosis not present

## 2021-06-09 DIAGNOSIS — F0150 Vascular dementia without behavioral disturbance: Secondary | ICD-10-CM | POA: Diagnosis not present

## 2021-06-09 DIAGNOSIS — G250 Essential tremor: Secondary | ICD-10-CM | POA: Diagnosis not present

## 2021-06-09 DIAGNOSIS — I509 Heart failure, unspecified: Secondary | ICD-10-CM | POA: Diagnosis not present

## 2021-06-09 DIAGNOSIS — F339 Major depressive disorder, recurrent, unspecified: Secondary | ICD-10-CM

## 2021-06-09 DIAGNOSIS — I482 Chronic atrial fibrillation, unspecified: Secondary | ICD-10-CM

## 2021-06-09 DIAGNOSIS — R627 Adult failure to thrive: Secondary | ICD-10-CM

## 2021-06-09 DIAGNOSIS — F0280 Dementia in other diseases classified elsewhere without behavioral disturbance: Secondary | ICD-10-CM | POA: Diagnosis not present

## 2021-06-09 DIAGNOSIS — R296 Repeated falls: Secondary | ICD-10-CM | POA: Diagnosis not present

## 2021-06-09 DIAGNOSIS — K5901 Slow transit constipation: Secondary | ICD-10-CM | POA: Diagnosis not present

## 2021-06-09 DIAGNOSIS — F0283 Dementia in other diseases classified elsewhere, unspecified severity, with mood disturbance: Secondary | ICD-10-CM | POA: Diagnosis not present

## 2021-06-09 DIAGNOSIS — H0100B Unspecified blepharitis left eye, upper and lower eyelids: Secondary | ICD-10-CM | POA: Diagnosis not present

## 2021-06-09 DIAGNOSIS — I130 Hypertensive heart and chronic kidney disease with heart failure and stage 1 through stage 4 chronic kidney disease, or unspecified chronic kidney disease: Secondary | ICD-10-CM | POA: Diagnosis not present

## 2021-06-09 DIAGNOSIS — H0100A Unspecified blepharitis right eye, upper and lower eyelids: Secondary | ICD-10-CM

## 2021-06-09 DIAGNOSIS — N183 Chronic kidney disease, stage 3 unspecified: Secondary | ICD-10-CM | POA: Diagnosis not present

## 2021-06-09 NOTE — Assessment & Plan Note (Signed)
Chronic lower back pain, takes Tylenol, Morphine

## 2021-06-09 NOTE — Assessment & Plan Note (Signed)
able to be redirected with activities,

## 2021-06-09 NOTE — Assessment & Plan Note (Signed)
heart rate is in control, not on rate control agent or anticoagulation.

## 2021-06-09 NOTE — Assessment & Plan Note (Signed)
supportive care, under Hospice service.

## 2021-06-09 NOTE — Progress Notes (Signed)
Location:   [facility] Room Number: 1 Place of Service:  SNF [phone]) Provider:  Einar Crow MD  Mahlon Gammon, MD  Patient Care Team: Mahlon Gammon, MD as PCP - General (Internal Medicine) Nahser, Deloris Ping, MD as PCP - Cardiology (Cardiology) Amarianna Abplanalp X, NP as Nurse Practitioner (Internal Medicine)  Extended Emergency Contact Information Primary Emergency Contact: Morgan,Tom Address: [address]          [place], [place] [address] [place] of [place] Home Phone: [phone] Relation: Friend Secondary Emergency Contact: Brandy Hale [place] of [place] Home Phone: [phone] Relation: Niece  Code Status:  DNR Hospice Goals of care: Advanced Directive information Advanced Directives 06/09/2021  Does Patient Have a Medical Advance Directive? Yes  Type of Estate agent of [place];Out of facility DNR (pink MOST or yellow form)  Does patient want to make changes to medical advance directive? No - Patient declined  Copy of Healthcare Power of Attorney in Chart? Yes - validated most recent copy scanned in chart (See row information)  Would patient like information on creating a medical advance directive? -  Pre-existing out of facility DNR order (yellow form or pink MOST form) Yellow form placed in chart (order not valid for inpatient use);Pink MOST form placed in chart (order not valid for inpatient use)     Chief Complaint  Patient presents with   Medical Management of Chronic Issues   Quality Metric Gaps    Shingrix     HPI:  Pt is a 87 y.o. female seen today for medical management of chronic diseases.      Advanced dementia, no safety awareness, total care of ADLs             FTT, supportive care, under Hospice service. weight loss gradually              Essential tremor, dependent of ADLs, off  Propranolol             Anxiety/depression, able to be redirected with activities,             Chronic  lower back pain, takes Tylenol, Morphine             Constipation, takes Senokot S II qhs, MOM qod.               Blepharitis bil, chronic.             Afib, heart rate is in control, not on rate control agent or anticoagulation. Past Medical History:  Diagnosis Date   Anticoagulant long-term use    Atrial fibrillation (HCC)    CHF (congestive heart failure) (HCC)    Chronic anticoagulation    Hypertension    Raynaud phenomenon    Tremor, essential    Past Surgical History:  Procedure Laterality Date   CHOLECYSTECTOMY     TONSILLECTOMY      Allergies  Allergen Reactions   Fosamax [Alendronate]    Other Nausea And Vomiting    Green pepper    Allergies as of 06/09/2021       Reactions   Fosamax [alendronate]    Other Nausea And Vomiting   Green pepper        Medication List        Accurate as of June 09, 2021 11:59 PM. If you have any questions, ask your nurse or doctor.          STOP taking these medications  nystatin powder Commonly known as: MYCOSTATIN/NYSTOP Stopped by: Mazella Deen X Batul Diego, NP       TAKE these medications    acetaminophen 500 MG tablet Commonly known as: TYLENOL Take 500 mg by mouth every 8 (eight) hours as needed for mild pain or moderate pain.   acetaminophen 500 MG tablet Commonly known as: TYLENOL Take 1,000 mg by mouth 2 (two) times daily. Not to exceed 3,00 mg daily.   magnesium hydroxide 400 MG/5ML suspension Commonly known as: MILK OF MAGNESIA Take by mouth every other day.   morphine 5 MG/ML injection Inject into the vein. 100 mg/5 mL (20 mg/mL); amt: 5 mg = 0.25 mL; oral Special Instructions: PRN pain/dyspnea/restlessness Every 4 Hours - PRN   sennosides-docusate sodium 8.6-50 MG tablet Commonly known as: SENOKOT-S Take 2 tablets by mouth at bedtime.        Review of Systems  Unable to perform ROS: Dementia   Immunization History  Administered Date(s) Administered   Influenza Whole 05/09/2018   Influenza,  High Dose Seasonal PF 05/09/2019   Influenza-Unspecified 05/28/2017, 05/19/2020, 05/25/2021   Moderna SARS-COV2 Booster Vaccination 01/04/2021   Moderna Sars-Covid-2 Vaccination 08/09/2019, 09/06/2019, 06/15/2020   PFIZER(Purple Top)SARS-COV-2 Vaccination 04/26/2021   Pneumococcal Conjugate-13 07/13/2017   Pneumococcal Polysaccharide-23 04/07/2002   Tdap 07/13/2017, 10/08/2017   Pertinent  Health Maintenance Due  Topic Date Due   INFLUENZA VACCINE  Completed   DEXA SCAN  Completed   Fall Risk 10/05/2017 04/20/2018 11/25/2018 03/10/2019  Falls in the past year? No - - (No Data)  Number of falls in past year - - - Emmi Telephone Survey Actual Response =   Patient Fall Risk Level - High fall risk High fall risk -   Functional Status Survey:    Vitals:   06/09/21 1011  BP: 122/70  Pulse: 70  Temp: (!) 97.4 F (36.3 C)  SpO2: 94%  Weight: 97 lb (44 kg)  Height: 5\' 4"  (1.626 m)   Body mass index is 16.65 kg/m. Physical Exam Vitals and nursing note reviewed.  Constitutional:      Appearance: Normal appearance.     Comments: Frail, weight loss #2Ibs in the past month.   HENT:     Head: Normocephalic and atraumatic.     Nose: Nose normal.     Mouth/Throat:     Mouth: Mucous membranes are moist.  Eyes:     Extraocular Movements: Extraocular movements intact.     Conjunctiva/sclera: Conjunctivae normal.     Pupils: Pupils are equal, round, and reactive to light.  Cardiovascular:     Rate and Rhythm: Normal rate. Rhythm irregular.     Heart sounds: No murmur heard. Pulmonary:     Breath sounds: No rales.  Abdominal:     Palpations: Abdomen is soft.     Tenderness: There is no abdominal tenderness.  Musculoskeletal:     Cervical back: Normal range of motion and neck supple.     Right lower leg: No edema.     Left lower leg: No edema.  Skin:    General: Skin is warm and dry.  Neurological:     General: No focal deficit present.     Mental Status: She is alert. Mental  status is at baseline.     Coordination: Coordination abnormal.     Gait: Gait abnormal.     Comments: Tremor in fingers are worsened. Oriented to self. No longer ambulatory.   Psychiatric:     Comments: Confused, but smiled when she  talked to.     Labs reviewed: No results for input(s): NA, K, CL, CO2, GLUCOSE, BUN, CREATININE, CALCIUM, MG, PHOS in the last 8760 hours. No results for input(s): AST, ALT, ALKPHOS, BILITOT, PROT, ALBUMIN in the last 8760 hours. No results for input(s): WBC, NEUTROABS, HGB, HCT, MCV, PLT in the last 8760 hours. Lab Results  Component Value Date   TSH 0.76 05/21/2019   No results found for: HGBA1C No results found for: CHOL, HDL, LDLCALC, LDLDIRECT, TRIG, CHOLHDL  Significant Diagnostic Results in last 30 days:  No results found.  Assessment/Plan Chronic a-fib (HCC) heart rate is in control, not on rate control agent or anticoagulation.  Blepharitis of both eyes Chronic, assist the patient with cleansing eyelid/eyelashes.   Slow transit constipation Stable, takes Senokot S II qhs, MOM qod.   Lower back pain Chronic lower back pain, takes Tylenol, Morphine  Depression, recurrent (HCC) able to be redirected with activities,  Tremor, essential dependent of ADLs, off  Propranolol  Adult failure to thrive supportive care, under Hospice service.   Mixed Alzheimer's and vascular dementia (HCC) no safety awareness, total care of ADLs   Family/ staff Communication: plan of care reviewed with the patient and charge nurse.   Labs/tests ordered:  none  Time spend 25 minutes.

## 2021-06-09 NOTE — Assessment & Plan Note (Signed)
Stable, takes Senokot S II qhs, MOM qod.

## 2021-06-09 NOTE — Assessment & Plan Note (Signed)
dependent of ADLs, off  Propranolol

## 2021-06-09 NOTE — Assessment & Plan Note (Signed)
Chronic, assist the patient with cleansing eyelid/eyelashes.

## 2021-06-09 NOTE — Assessment & Plan Note (Signed)
no safety awareness, total care of ADLs

## 2021-06-10 ENCOUNTER — Encounter: Payer: Self-pay | Admitting: Nurse Practitioner

## 2021-06-13 DIAGNOSIS — I509 Heart failure, unspecified: Secondary | ICD-10-CM | POA: Diagnosis not present

## 2021-06-13 DIAGNOSIS — I130 Hypertensive heart and chronic kidney disease with heart failure and stage 1 through stage 4 chronic kidney disease, or unspecified chronic kidney disease: Secondary | ICD-10-CM | POA: Diagnosis not present

## 2021-06-13 DIAGNOSIS — N183 Chronic kidney disease, stage 3 unspecified: Secondary | ICD-10-CM | POA: Diagnosis not present

## 2021-06-13 DIAGNOSIS — G309 Alzheimer's disease, unspecified: Secondary | ICD-10-CM | POA: Diagnosis not present

## 2021-06-13 DIAGNOSIS — F0283 Dementia in other diseases classified elsewhere, unspecified severity, with mood disturbance: Secondary | ICD-10-CM | POA: Diagnosis not present

## 2021-06-13 DIAGNOSIS — R296 Repeated falls: Secondary | ICD-10-CM | POA: Diagnosis not present

## 2021-06-14 DIAGNOSIS — G309 Alzheimer's disease, unspecified: Secondary | ICD-10-CM | POA: Diagnosis not present

## 2021-06-14 DIAGNOSIS — R296 Repeated falls: Secondary | ICD-10-CM | POA: Diagnosis not present

## 2021-06-14 DIAGNOSIS — F0283 Dementia in other diseases classified elsewhere, unspecified severity, with mood disturbance: Secondary | ICD-10-CM | POA: Diagnosis not present

## 2021-06-14 DIAGNOSIS — I130 Hypertensive heart and chronic kidney disease with heart failure and stage 1 through stage 4 chronic kidney disease, or unspecified chronic kidney disease: Secondary | ICD-10-CM | POA: Diagnosis not present

## 2021-06-14 DIAGNOSIS — N183 Chronic kidney disease, stage 3 unspecified: Secondary | ICD-10-CM | POA: Diagnosis not present

## 2021-06-14 DIAGNOSIS — I509 Heart failure, unspecified: Secondary | ICD-10-CM | POA: Diagnosis not present

## 2021-06-16 DIAGNOSIS — R296 Repeated falls: Secondary | ICD-10-CM | POA: Diagnosis not present

## 2021-06-16 DIAGNOSIS — I130 Hypertensive heart and chronic kidney disease with heart failure and stage 1 through stage 4 chronic kidney disease, or unspecified chronic kidney disease: Secondary | ICD-10-CM | POA: Diagnosis not present

## 2021-06-16 DIAGNOSIS — N183 Chronic kidney disease, stage 3 unspecified: Secondary | ICD-10-CM | POA: Diagnosis not present

## 2021-06-16 DIAGNOSIS — G309 Alzheimer's disease, unspecified: Secondary | ICD-10-CM | POA: Diagnosis not present

## 2021-06-16 DIAGNOSIS — F0283 Dementia in other diseases classified elsewhere, unspecified severity, with mood disturbance: Secondary | ICD-10-CM | POA: Diagnosis not present

## 2021-06-16 DIAGNOSIS — I509 Heart failure, unspecified: Secondary | ICD-10-CM | POA: Diagnosis not present

## 2021-06-21 DIAGNOSIS — G309 Alzheimer's disease, unspecified: Secondary | ICD-10-CM | POA: Diagnosis not present

## 2021-06-21 DIAGNOSIS — R296 Repeated falls: Secondary | ICD-10-CM | POA: Diagnosis not present

## 2021-06-21 DIAGNOSIS — I130 Hypertensive heart and chronic kidney disease with heart failure and stage 1 through stage 4 chronic kidney disease, or unspecified chronic kidney disease: Secondary | ICD-10-CM | POA: Diagnosis not present

## 2021-06-21 DIAGNOSIS — I509 Heart failure, unspecified: Secondary | ICD-10-CM | POA: Diagnosis not present

## 2021-06-21 DIAGNOSIS — F0283 Dementia in other diseases classified elsewhere, unspecified severity, with mood disturbance: Secondary | ICD-10-CM | POA: Diagnosis not present

## 2021-06-21 DIAGNOSIS — N183 Chronic kidney disease, stage 3 unspecified: Secondary | ICD-10-CM | POA: Diagnosis not present

## 2021-06-22 DIAGNOSIS — N183 Chronic kidney disease, stage 3 unspecified: Secondary | ICD-10-CM | POA: Diagnosis not present

## 2021-06-22 DIAGNOSIS — G309 Alzheimer's disease, unspecified: Secondary | ICD-10-CM | POA: Diagnosis not present

## 2021-06-22 DIAGNOSIS — I509 Heart failure, unspecified: Secondary | ICD-10-CM | POA: Diagnosis not present

## 2021-06-22 DIAGNOSIS — F0283 Dementia in other diseases classified elsewhere, unspecified severity, with mood disturbance: Secondary | ICD-10-CM | POA: Diagnosis not present

## 2021-06-22 DIAGNOSIS — I130 Hypertensive heart and chronic kidney disease with heart failure and stage 1 through stage 4 chronic kidney disease, or unspecified chronic kidney disease: Secondary | ICD-10-CM | POA: Diagnosis not present

## 2021-06-22 DIAGNOSIS — R296 Repeated falls: Secondary | ICD-10-CM | POA: Diagnosis not present

## 2021-06-23 DIAGNOSIS — I509 Heart failure, unspecified: Secondary | ICD-10-CM | POA: Diagnosis not present

## 2021-06-23 DIAGNOSIS — G309 Alzheimer's disease, unspecified: Secondary | ICD-10-CM | POA: Diagnosis not present

## 2021-06-23 DIAGNOSIS — R296 Repeated falls: Secondary | ICD-10-CM | POA: Diagnosis not present

## 2021-06-23 DIAGNOSIS — I130 Hypertensive heart and chronic kidney disease with heart failure and stage 1 through stage 4 chronic kidney disease, or unspecified chronic kidney disease: Secondary | ICD-10-CM | POA: Diagnosis not present

## 2021-06-23 DIAGNOSIS — F0283 Dementia in other diseases classified elsewhere, unspecified severity, with mood disturbance: Secondary | ICD-10-CM | POA: Diagnosis not present

## 2021-06-23 DIAGNOSIS — N183 Chronic kidney disease, stage 3 unspecified: Secondary | ICD-10-CM | POA: Diagnosis not present

## 2021-06-28 DIAGNOSIS — F0283 Dementia in other diseases classified elsewhere, unspecified severity, with mood disturbance: Secondary | ICD-10-CM | POA: Diagnosis not present

## 2021-06-28 DIAGNOSIS — N183 Chronic kidney disease, stage 3 unspecified: Secondary | ICD-10-CM | POA: Diagnosis not present

## 2021-06-28 DIAGNOSIS — I509 Heart failure, unspecified: Secondary | ICD-10-CM | POA: Diagnosis not present

## 2021-06-28 DIAGNOSIS — I130 Hypertensive heart and chronic kidney disease with heart failure and stage 1 through stage 4 chronic kidney disease, or unspecified chronic kidney disease: Secondary | ICD-10-CM | POA: Diagnosis not present

## 2021-06-28 DIAGNOSIS — G309 Alzheimer's disease, unspecified: Secondary | ICD-10-CM | POA: Diagnosis not present

## 2021-06-28 DIAGNOSIS — R296 Repeated falls: Secondary | ICD-10-CM | POA: Diagnosis not present

## 2021-06-30 DIAGNOSIS — N183 Chronic kidney disease, stage 3 unspecified: Secondary | ICD-10-CM | POA: Diagnosis not present

## 2021-06-30 DIAGNOSIS — F0283 Dementia in other diseases classified elsewhere, unspecified severity, with mood disturbance: Secondary | ICD-10-CM | POA: Diagnosis not present

## 2021-06-30 DIAGNOSIS — I130 Hypertensive heart and chronic kidney disease with heart failure and stage 1 through stage 4 chronic kidney disease, or unspecified chronic kidney disease: Secondary | ICD-10-CM | POA: Diagnosis not present

## 2021-06-30 DIAGNOSIS — I509 Heart failure, unspecified: Secondary | ICD-10-CM | POA: Diagnosis not present

## 2021-06-30 DIAGNOSIS — G309 Alzheimer's disease, unspecified: Secondary | ICD-10-CM | POA: Diagnosis not present

## 2021-06-30 DIAGNOSIS — R296 Repeated falls: Secondary | ICD-10-CM | POA: Diagnosis not present

## 2021-07-05 DIAGNOSIS — G309 Alzheimer's disease, unspecified: Secondary | ICD-10-CM | POA: Diagnosis not present

## 2021-07-05 DIAGNOSIS — R296 Repeated falls: Secondary | ICD-10-CM | POA: Diagnosis not present

## 2021-07-05 DIAGNOSIS — I130 Hypertensive heart and chronic kidney disease with heart failure and stage 1 through stage 4 chronic kidney disease, or unspecified chronic kidney disease: Secondary | ICD-10-CM | POA: Diagnosis not present

## 2021-07-05 DIAGNOSIS — N183 Chronic kidney disease, stage 3 unspecified: Secondary | ICD-10-CM | POA: Diagnosis not present

## 2021-07-05 DIAGNOSIS — F0283 Dementia in other diseases classified elsewhere, unspecified severity, with mood disturbance: Secondary | ICD-10-CM | POA: Diagnosis not present

## 2021-07-05 DIAGNOSIS — I509 Heart failure, unspecified: Secondary | ICD-10-CM | POA: Diagnosis not present

## 2021-07-07 DIAGNOSIS — F02811 Dementia in other diseases classified elsewhere, unspecified severity, with agitation: Secondary | ICD-10-CM | POA: Diagnosis not present

## 2021-07-07 DIAGNOSIS — M24562 Contracture, left knee: Secondary | ICD-10-CM | POA: Diagnosis not present

## 2021-07-07 DIAGNOSIS — R159 Full incontinence of feces: Secondary | ICD-10-CM | POA: Diagnosis not present

## 2021-07-07 DIAGNOSIS — I4891 Unspecified atrial fibrillation: Secondary | ICD-10-CM | POA: Diagnosis not present

## 2021-07-07 DIAGNOSIS — F0283 Dementia in other diseases classified elsewhere, unspecified severity, with mood disturbance: Secondary | ICD-10-CM | POA: Diagnosis not present

## 2021-07-07 DIAGNOSIS — Z741 Need for assistance with personal care: Secondary | ICD-10-CM | POA: Diagnosis not present

## 2021-07-07 DIAGNOSIS — R32 Unspecified urinary incontinence: Secondary | ICD-10-CM | POA: Diagnosis not present

## 2021-07-07 DIAGNOSIS — E46 Unspecified protein-calorie malnutrition: Secondary | ICD-10-CM | POA: Diagnosis not present

## 2021-07-07 DIAGNOSIS — F0284 Dementia in other diseases classified elsewhere, unspecified severity, with anxiety: Secondary | ICD-10-CM | POA: Diagnosis not present

## 2021-07-07 DIAGNOSIS — M24561 Contracture, right knee: Secondary | ICD-10-CM | POA: Diagnosis not present

## 2021-07-07 DIAGNOSIS — Z681 Body mass index (BMI) 19 or less, adult: Secondary | ICD-10-CM | POA: Diagnosis not present

## 2021-07-07 DIAGNOSIS — H01003 Unspecified blepharitis right eye, unspecified eyelid: Secondary | ICD-10-CM | POA: Diagnosis not present

## 2021-07-07 DIAGNOSIS — G250 Essential tremor: Secondary | ICD-10-CM | POA: Diagnosis not present

## 2021-07-07 DIAGNOSIS — R296 Repeated falls: Secondary | ICD-10-CM | POA: Diagnosis not present

## 2021-07-07 DIAGNOSIS — S22080D Wedge compression fracture of T11-T12 vertebra, subsequent encounter for fracture with routine healing: Secondary | ICD-10-CM | POA: Diagnosis not present

## 2021-07-07 DIAGNOSIS — G309 Alzheimer's disease, unspecified: Secondary | ICD-10-CM | POA: Diagnosis not present

## 2021-07-07 DIAGNOSIS — Z993 Dependence on wheelchair: Secondary | ICD-10-CM | POA: Diagnosis not present

## 2021-07-07 DIAGNOSIS — I130 Hypertensive heart and chronic kidney disease with heart failure and stage 1 through stage 4 chronic kidney disease, or unspecified chronic kidney disease: Secondary | ICD-10-CM | POA: Diagnosis not present

## 2021-07-07 DIAGNOSIS — D631 Anemia in chronic kidney disease: Secondary | ICD-10-CM | POA: Diagnosis not present

## 2021-07-07 DIAGNOSIS — Z87440 Personal history of urinary (tract) infections: Secondary | ICD-10-CM | POA: Diagnosis not present

## 2021-07-07 DIAGNOSIS — Z947 Corneal transplant status: Secondary | ICD-10-CM | POA: Diagnosis not present

## 2021-07-07 DIAGNOSIS — I7300 Raynaud's syndrome without gangrene: Secondary | ICD-10-CM | POA: Diagnosis not present

## 2021-07-07 DIAGNOSIS — K5909 Other constipation: Secondary | ICD-10-CM | POA: Diagnosis not present

## 2021-07-07 DIAGNOSIS — I509 Heart failure, unspecified: Secondary | ICD-10-CM | POA: Diagnosis not present

## 2021-07-07 DIAGNOSIS — N183 Chronic kidney disease, stage 3 unspecified: Secondary | ICD-10-CM | POA: Diagnosis not present

## 2021-07-08 DIAGNOSIS — I130 Hypertensive heart and chronic kidney disease with heart failure and stage 1 through stage 4 chronic kidney disease, or unspecified chronic kidney disease: Secondary | ICD-10-CM | POA: Diagnosis not present

## 2021-07-08 DIAGNOSIS — R296 Repeated falls: Secondary | ICD-10-CM | POA: Diagnosis not present

## 2021-07-08 DIAGNOSIS — F0283 Dementia in other diseases classified elsewhere, unspecified severity, with mood disturbance: Secondary | ICD-10-CM | POA: Diagnosis not present

## 2021-07-08 DIAGNOSIS — I509 Heart failure, unspecified: Secondary | ICD-10-CM | POA: Diagnosis not present

## 2021-07-08 DIAGNOSIS — N183 Chronic kidney disease, stage 3 unspecified: Secondary | ICD-10-CM | POA: Diagnosis not present

## 2021-07-08 DIAGNOSIS — G309 Alzheimer's disease, unspecified: Secondary | ICD-10-CM | POA: Diagnosis not present

## 2021-07-11 DIAGNOSIS — F0283 Dementia in other diseases classified elsewhere, unspecified severity, with mood disturbance: Secondary | ICD-10-CM | POA: Diagnosis not present

## 2021-07-11 DIAGNOSIS — N183 Chronic kidney disease, stage 3 unspecified: Secondary | ICD-10-CM | POA: Diagnosis not present

## 2021-07-11 DIAGNOSIS — I130 Hypertensive heart and chronic kidney disease with heart failure and stage 1 through stage 4 chronic kidney disease, or unspecified chronic kidney disease: Secondary | ICD-10-CM | POA: Diagnosis not present

## 2021-07-11 DIAGNOSIS — I509 Heart failure, unspecified: Secondary | ICD-10-CM | POA: Diagnosis not present

## 2021-07-11 DIAGNOSIS — G309 Alzheimer's disease, unspecified: Secondary | ICD-10-CM | POA: Diagnosis not present

## 2021-07-11 DIAGNOSIS — R296 Repeated falls: Secondary | ICD-10-CM | POA: Diagnosis not present

## 2021-07-12 ENCOUNTER — Encounter: Payer: Self-pay | Admitting: Internal Medicine

## 2021-07-12 ENCOUNTER — Non-Acute Institutional Stay (SKILLED_NURSING_FACILITY): Admitting: Internal Medicine

## 2021-07-12 DIAGNOSIS — I482 Chronic atrial fibrillation, unspecified: Secondary | ICD-10-CM | POA: Diagnosis not present

## 2021-07-12 DIAGNOSIS — F0280 Dementia in other diseases classified elsewhere without behavioral disturbance: Secondary | ICD-10-CM

## 2021-07-12 DIAGNOSIS — G309 Alzheimer's disease, unspecified: Secondary | ICD-10-CM | POA: Diagnosis not present

## 2021-07-12 DIAGNOSIS — F0150 Vascular dementia without behavioral disturbance: Secondary | ICD-10-CM

## 2021-07-12 DIAGNOSIS — N183 Chronic kidney disease, stage 3 unspecified: Secondary | ICD-10-CM | POA: Diagnosis not present

## 2021-07-12 DIAGNOSIS — R627 Adult failure to thrive: Secondary | ICD-10-CM | POA: Diagnosis not present

## 2021-07-12 DIAGNOSIS — I509 Heart failure, unspecified: Secondary | ICD-10-CM | POA: Diagnosis not present

## 2021-07-12 DIAGNOSIS — F0283 Dementia in other diseases classified elsewhere, unspecified severity, with mood disturbance: Secondary | ICD-10-CM | POA: Diagnosis not present

## 2021-07-12 DIAGNOSIS — G250 Essential tremor: Secondary | ICD-10-CM | POA: Diagnosis not present

## 2021-07-12 DIAGNOSIS — I130 Hypertensive heart and chronic kidney disease with heart failure and stage 1 through stage 4 chronic kidney disease, or unspecified chronic kidney disease: Secondary | ICD-10-CM | POA: Diagnosis not present

## 2021-07-12 DIAGNOSIS — R296 Repeated falls: Secondary | ICD-10-CM | POA: Diagnosis not present

## 2021-07-12 NOTE — Progress Notes (Signed)
Location:   [facility] Room Number: 1 Place of Service:  SNF [phone]) Provider:  Einar Crow MD  Mahlon Gammon, MD  Patient Care Team: Mahlon Gammon, MD as PCP - General (Internal Medicine) Nahser, Deloris Ping, MD as PCP - Cardiology (Cardiology) Mast, Man X, NP as Nurse Practitioner (Internal Medicine)  Extended Emergency Contact Information Primary Emergency Contact: Morgan,Tom Address: [address]          [place], [place] [address] [place] Home Phone: [phone] Relation: Friend Secondary Emergency Contact: Brandy Hale [place] of [place] Home Phone: [phone] Relation: Niece  Code Status:  DNR Hospice Goals of care: Advanced Directive information Advanced Directives 07/12/2021  Does Patient Have a Medical Advance Directive? Yes  Type of Estate agent of [place];Out of facility DNR (pink MOST or yellow form)  Does patient want to make changes to medical advance directive? No - Patient declined  Copy of Healthcare Power of Attorney in Chart? Yes - validated most recent copy scanned in chart (See row information)  Would patient like information on creating a medical advance directive? -  Pre-existing out of facility DNR order (yellow form or pink MOST form) Yellow form placed in chart (order not valid for inpatient use);Pink MOST form placed in chart (order not valid for inpatient use)     Chief Complaint  Patient presents with   Medical Management of Chronic Issues   Quality Metric Gaps    Shingrix, #Covid    HPI:  Pt is a 87 y.o. female seen today for medical management of chronic diseases.     Patient has h/o Atrial Fibrillation , LE edema, Hypertension, Dysphagia,Essential Tremor, Anemia, Dementia,and depression, Falls Weight loss Patient is a long-term resident    She is stable. No new Nursing issues. No Behavior issues Does not Walk anymore. Enrolled in Hospice Has lost  some weight  Wt Readings from Last 3 Encounters:  07/12/21 96 lb 12.8 oz (43.9 kg)  06/09/21 97 lb (44 kg)  05/13/21 99 lb 3.2 oz (45 kg)     Past Medical History:  Diagnosis Date   Anticoagulant long-term use    Atrial fibrillation (HCC)    CHF (congestive heart failure) (HCC)    Chronic anticoagulation    Hypertension    Raynaud phenomenon    Tremor, essential    Past Surgical History:  Procedure Laterality Date   CHOLECYSTECTOMY     TONSILLECTOMY      Allergies  Allergen Reactions   Fosamax [Alendronate]    Other Nausea And Vomiting    Green pepper    Allergies as of 07/12/2021       Reactions   Fosamax [alendronate]    Other Nausea And Vomiting   Green pepper        Medication List        Accurate as of July 12, 2021  3:05 PM. If you have any questions, ask your nurse or doctor.          acetaminophen 500 MG tablet Commonly known as: TYLENOL Take 500 mg by mouth every 8 (eight) hours as needed for mild pain or moderate pain.   acetaminophen 500 MG tablet Commonly known as: TYLENOL Take 1,000 mg by mouth 2 (two) times daily. Not to exceed 3,00 mg daily.   magnesium hydroxide 400 MG/5ML suspension Commonly known as: MILK OF MAGNESIA Take by mouth every other day.   morphine 5 MG/ML injection  Inject into the vein. 100 mg/5 mL (20 mg/mL); amt: 5 mg = 0.25 mL; oral Special Instructions: PRN pain/dyspnea/restlessness Every 4 Hours - PRN   sennosides-docusate sodium 8.6-50 MG tablet Commonly known as: SENOKOT-S Take 2 tablets by mouth at bedtime.        Review of Systems  Unable to perform ROS: Dementia   Immunization History  Administered Date(s) Administered   Influenza Whole 05/09/2018   Influenza, High Dose Seasonal PF 05/09/2019   Influenza-Unspecified 05/28/2017, 05/19/2020, 05/25/2021   Moderna SARS-COV2 Booster Vaccination 01/04/2021   Moderna Sars-Covid-2 Vaccination 08/09/2019, 09/06/2019, 06/15/2020   PFIZER(Purple  Top)SARS-COV-2 Vaccination 04/26/2021   Pneumococcal Conjugate-13 07/13/2017   Pneumococcal Polysaccharide-23 04/07/2002   Tdap 07/13/2017, 10/08/2017   Pertinent  Health Maintenance Due  Topic Date Due   INFLUENZA VACCINE  Completed   DEXA SCAN  Completed   Fall Risk 10/05/2017 04/20/2018 11/25/2018 03/10/2019  Falls in the past year? No - - (No Data)  Number of falls in past year - - - Emmi Telephone Survey Actual Response =   Patient Fall Risk Level - High fall risk High fall risk -   Functional Status Survey:    Vitals:   07/12/21 1422  BP: 110/68  Pulse: 74  Resp: 16  Temp: (!) 97.1 F (36.2 C)  SpO2: 91%  Weight: 96 lb 12.8 oz (43.9 kg)  Height: 5\' 4"  (1.626 m)   Body mass index is 16.62 kg/m. Physical Exam Vitals reviewed.  Constitutional:      Comments: Very Frail  HENT:     Head: Normocephalic.     Nose: Nose normal.     Mouth/Throat:     Mouth: Mucous membranes are moist.     Pharynx: Oropharynx is clear.  Eyes:     Pupils: Pupils are equal, round, and reactive to light.  Cardiovascular:     Rate and Rhythm: Normal rate. Rhythm irregular.     Pulses: Normal pulses.     Heart sounds: Normal heart sounds. No murmur heard. Pulmonary:     Effort: Pulmonary effort is normal.     Breath sounds: Normal breath sounds.  Abdominal:     General: Abdomen is flat. Bowel sounds are normal.     Palpations: Abdomen is soft.  Musculoskeletal:        General: No swelling.     Cervical back: Neck supple.  Skin:    General: Skin is warm.  Neurological:     General: No focal deficit present.     Mental Status: She is alert.     Comments: Does not respond or follows any commands  Psychiatric:        Mood and Affect: Mood normal.        Thought Content: Thought content normal.    Labs reviewed: Recent Labs    10/28/20 0000  NA 135*  K 4.5  CL 101  CO2 27*  BUN 17  CREATININE 0.7  CALCIUM 8.7   Recent Labs    10/28/20 0000  AST 18  ALT 16  ALKPHOS 63   ALBUMIN 3.4*   Recent Labs    10/28/20 0000  WBC 5.4  HGB 11.6*  HCT 35*  PLT 233   Lab Results  Component Value Date   TSH 2.65 10/28/2020   No results found for: HGBA1C No results found for: CHOL, HDL, LDLCALC, LDLDIRECT, TRIG, CHOLHDL  Significant Diagnostic Results in last 30 days:  No results found.  Assessment/Plan 1. Chronic a-fib (HCC) Off  a;; Meds due to Falls  2. Mixed Alzheimer's and vascular dementia (HCC) Enrolled in Hospice Supportive care  3. Tremor, essential Needs Help with her feeding  4. Adult failure to thrive Continues to loose weight due to dysphagia Enrolled [facility] hospice     Family/ staff Communication:   Labs/tests ordered:

## 2021-07-14 DIAGNOSIS — I130 Hypertensive heart and chronic kidney disease with heart failure and stage 1 through stage 4 chronic kidney disease, or unspecified chronic kidney disease: Secondary | ICD-10-CM | POA: Diagnosis not present

## 2021-07-14 DIAGNOSIS — G309 Alzheimer's disease, unspecified: Secondary | ICD-10-CM | POA: Diagnosis not present

## 2021-07-14 DIAGNOSIS — R296 Repeated falls: Secondary | ICD-10-CM | POA: Diagnosis not present

## 2021-07-14 DIAGNOSIS — I509 Heart failure, unspecified: Secondary | ICD-10-CM | POA: Diagnosis not present

## 2021-07-14 DIAGNOSIS — F0283 Dementia in other diseases classified elsewhere, unspecified severity, with mood disturbance: Secondary | ICD-10-CM | POA: Diagnosis not present

## 2021-07-14 DIAGNOSIS — N183 Chronic kidney disease, stage 3 unspecified: Secondary | ICD-10-CM | POA: Diagnosis not present

## 2021-07-18 DIAGNOSIS — G309 Alzheimer's disease, unspecified: Secondary | ICD-10-CM | POA: Diagnosis not present

## 2021-07-18 DIAGNOSIS — I509 Heart failure, unspecified: Secondary | ICD-10-CM | POA: Diagnosis not present

## 2021-07-18 DIAGNOSIS — I130 Hypertensive heart and chronic kidney disease with heart failure and stage 1 through stage 4 chronic kidney disease, or unspecified chronic kidney disease: Secondary | ICD-10-CM | POA: Diagnosis not present

## 2021-07-18 DIAGNOSIS — R296 Repeated falls: Secondary | ICD-10-CM | POA: Diagnosis not present

## 2021-07-18 DIAGNOSIS — F0283 Dementia in other diseases classified elsewhere, unspecified severity, with mood disturbance: Secondary | ICD-10-CM | POA: Diagnosis not present

## 2021-07-18 DIAGNOSIS — N183 Chronic kidney disease, stage 3 unspecified: Secondary | ICD-10-CM | POA: Diagnosis not present

## 2021-07-19 DIAGNOSIS — G309 Alzheimer's disease, unspecified: Secondary | ICD-10-CM | POA: Diagnosis not present

## 2021-07-19 DIAGNOSIS — I130 Hypertensive heart and chronic kidney disease with heart failure and stage 1 through stage 4 chronic kidney disease, or unspecified chronic kidney disease: Secondary | ICD-10-CM | POA: Diagnosis not present

## 2021-07-19 DIAGNOSIS — I509 Heart failure, unspecified: Secondary | ICD-10-CM | POA: Diagnosis not present

## 2021-07-19 DIAGNOSIS — N183 Chronic kidney disease, stage 3 unspecified: Secondary | ICD-10-CM | POA: Diagnosis not present

## 2021-07-19 DIAGNOSIS — R296 Repeated falls: Secondary | ICD-10-CM | POA: Diagnosis not present

## 2021-07-19 DIAGNOSIS — F0283 Dementia in other diseases classified elsewhere, unspecified severity, with mood disturbance: Secondary | ICD-10-CM | POA: Diagnosis not present

## 2021-07-21 DIAGNOSIS — G309 Alzheimer's disease, unspecified: Secondary | ICD-10-CM | POA: Diagnosis not present

## 2021-07-21 DIAGNOSIS — F0283 Dementia in other diseases classified elsewhere, unspecified severity, with mood disturbance: Secondary | ICD-10-CM | POA: Diagnosis not present

## 2021-07-21 DIAGNOSIS — I130 Hypertensive heart and chronic kidney disease with heart failure and stage 1 through stage 4 chronic kidney disease, or unspecified chronic kidney disease: Secondary | ICD-10-CM | POA: Diagnosis not present

## 2021-07-21 DIAGNOSIS — I509 Heart failure, unspecified: Secondary | ICD-10-CM | POA: Diagnosis not present

## 2021-07-21 DIAGNOSIS — R296 Repeated falls: Secondary | ICD-10-CM | POA: Diagnosis not present

## 2021-07-21 DIAGNOSIS — N183 Chronic kidney disease, stage 3 unspecified: Secondary | ICD-10-CM | POA: Diagnosis not present

## 2021-07-25 DIAGNOSIS — N183 Chronic kidney disease, stage 3 unspecified: Secondary | ICD-10-CM | POA: Diagnosis not present

## 2021-07-25 DIAGNOSIS — I509 Heart failure, unspecified: Secondary | ICD-10-CM | POA: Diagnosis not present

## 2021-07-25 DIAGNOSIS — I130 Hypertensive heart and chronic kidney disease with heart failure and stage 1 through stage 4 chronic kidney disease, or unspecified chronic kidney disease: Secondary | ICD-10-CM | POA: Diagnosis not present

## 2021-07-25 DIAGNOSIS — R296 Repeated falls: Secondary | ICD-10-CM | POA: Diagnosis not present

## 2021-07-25 DIAGNOSIS — F0283 Dementia in other diseases classified elsewhere, unspecified severity, with mood disturbance: Secondary | ICD-10-CM | POA: Diagnosis not present

## 2021-07-25 DIAGNOSIS — G309 Alzheimer's disease, unspecified: Secondary | ICD-10-CM | POA: Diagnosis not present

## 2021-07-26 DIAGNOSIS — F0283 Dementia in other diseases classified elsewhere, unspecified severity, with mood disturbance: Secondary | ICD-10-CM | POA: Diagnosis not present

## 2021-07-26 DIAGNOSIS — R296 Repeated falls: Secondary | ICD-10-CM | POA: Diagnosis not present

## 2021-07-26 DIAGNOSIS — G309 Alzheimer's disease, unspecified: Secondary | ICD-10-CM | POA: Diagnosis not present

## 2021-07-26 DIAGNOSIS — N183 Chronic kidney disease, stage 3 unspecified: Secondary | ICD-10-CM | POA: Diagnosis not present

## 2021-07-26 DIAGNOSIS — I130 Hypertensive heart and chronic kidney disease with heart failure and stage 1 through stage 4 chronic kidney disease, or unspecified chronic kidney disease: Secondary | ICD-10-CM | POA: Diagnosis not present

## 2021-07-26 DIAGNOSIS — I509 Heart failure, unspecified: Secondary | ICD-10-CM | POA: Diagnosis not present

## 2021-07-27 DIAGNOSIS — G309 Alzheimer's disease, unspecified: Secondary | ICD-10-CM | POA: Diagnosis not present

## 2021-07-27 DIAGNOSIS — I509 Heart failure, unspecified: Secondary | ICD-10-CM | POA: Diagnosis not present

## 2021-07-27 DIAGNOSIS — I130 Hypertensive heart and chronic kidney disease with heart failure and stage 1 through stage 4 chronic kidney disease, or unspecified chronic kidney disease: Secondary | ICD-10-CM | POA: Diagnosis not present

## 2021-07-27 DIAGNOSIS — F0283 Dementia in other diseases classified elsewhere, unspecified severity, with mood disturbance: Secondary | ICD-10-CM | POA: Diagnosis not present

## 2021-07-27 DIAGNOSIS — R296 Repeated falls: Secondary | ICD-10-CM | POA: Diagnosis not present

## 2021-07-27 DIAGNOSIS — N183 Chronic kidney disease, stage 3 unspecified: Secondary | ICD-10-CM | POA: Diagnosis not present

## 2021-07-28 DIAGNOSIS — I509 Heart failure, unspecified: Secondary | ICD-10-CM | POA: Diagnosis not present

## 2021-07-28 DIAGNOSIS — I130 Hypertensive heart and chronic kidney disease with heart failure and stage 1 through stage 4 chronic kidney disease, or unspecified chronic kidney disease: Secondary | ICD-10-CM | POA: Diagnosis not present

## 2021-07-28 DIAGNOSIS — G309 Alzheimer's disease, unspecified: Secondary | ICD-10-CM | POA: Diagnosis not present

## 2021-07-28 DIAGNOSIS — F0283 Dementia in other diseases classified elsewhere, unspecified severity, with mood disturbance: Secondary | ICD-10-CM | POA: Diagnosis not present

## 2021-07-28 DIAGNOSIS — N183 Chronic kidney disease, stage 3 unspecified: Secondary | ICD-10-CM | POA: Diagnosis not present

## 2021-07-28 DIAGNOSIS — R296 Repeated falls: Secondary | ICD-10-CM | POA: Diagnosis not present

## 2021-08-02 DIAGNOSIS — R296 Repeated falls: Secondary | ICD-10-CM | POA: Diagnosis not present

## 2021-08-02 DIAGNOSIS — G309 Alzheimer's disease, unspecified: Secondary | ICD-10-CM | POA: Diagnosis not present

## 2021-08-02 DIAGNOSIS — I509 Heart failure, unspecified: Secondary | ICD-10-CM | POA: Diagnosis not present

## 2021-08-02 DIAGNOSIS — N183 Chronic kidney disease, stage 3 unspecified: Secondary | ICD-10-CM | POA: Diagnosis not present

## 2021-08-02 DIAGNOSIS — I130 Hypertensive heart and chronic kidney disease with heart failure and stage 1 through stage 4 chronic kidney disease, or unspecified chronic kidney disease: Secondary | ICD-10-CM | POA: Diagnosis not present

## 2021-08-02 DIAGNOSIS — F0283 Dementia in other diseases classified elsewhere, unspecified severity, with mood disturbance: Secondary | ICD-10-CM | POA: Diagnosis not present

## 2021-08-04 DIAGNOSIS — N183 Chronic kidney disease, stage 3 unspecified: Secondary | ICD-10-CM | POA: Diagnosis not present

## 2021-08-04 DIAGNOSIS — F0283 Dementia in other diseases classified elsewhere, unspecified severity, with mood disturbance: Secondary | ICD-10-CM | POA: Diagnosis not present

## 2021-08-04 DIAGNOSIS — R296 Repeated falls: Secondary | ICD-10-CM | POA: Diagnosis not present

## 2021-08-04 DIAGNOSIS — I509 Heart failure, unspecified: Secondary | ICD-10-CM | POA: Diagnosis not present

## 2021-08-04 DIAGNOSIS — G309 Alzheimer's disease, unspecified: Secondary | ICD-10-CM | POA: Diagnosis not present

## 2021-08-04 DIAGNOSIS — I130 Hypertensive heart and chronic kidney disease with heart failure and stage 1 through stage 4 chronic kidney disease, or unspecified chronic kidney disease: Secondary | ICD-10-CM | POA: Diagnosis not present

## 2021-08-05 DIAGNOSIS — I509 Heart failure, unspecified: Secondary | ICD-10-CM | POA: Diagnosis not present

## 2021-08-05 DIAGNOSIS — N183 Chronic kidney disease, stage 3 unspecified: Secondary | ICD-10-CM | POA: Diagnosis not present

## 2021-08-05 DIAGNOSIS — G309 Alzheimer's disease, unspecified: Secondary | ICD-10-CM | POA: Diagnosis not present

## 2021-08-05 DIAGNOSIS — F0283 Dementia in other diseases classified elsewhere, unspecified severity, with mood disturbance: Secondary | ICD-10-CM | POA: Diagnosis not present

## 2021-08-05 DIAGNOSIS — I130 Hypertensive heart and chronic kidney disease with heart failure and stage 1 through stage 4 chronic kidney disease, or unspecified chronic kidney disease: Secondary | ICD-10-CM | POA: Diagnosis not present

## 2021-08-05 DIAGNOSIS — R296 Repeated falls: Secondary | ICD-10-CM | POA: Diagnosis not present

## 2021-08-11 ENCOUNTER — Encounter: Payer: Self-pay | Admitting: Nurse Practitioner

## 2021-08-11 ENCOUNTER — Non-Acute Institutional Stay (SKILLED_NURSING_FACILITY): Payer: Medicare Other | Admitting: Nurse Practitioner

## 2021-08-11 DIAGNOSIS — F0150 Vascular dementia without behavioral disturbance: Secondary | ICD-10-CM

## 2021-08-11 DIAGNOSIS — M545 Low back pain, unspecified: Secondary | ICD-10-CM

## 2021-08-11 DIAGNOSIS — G250 Essential tremor: Secondary | ICD-10-CM

## 2021-08-11 DIAGNOSIS — I482 Chronic atrial fibrillation, unspecified: Secondary | ICD-10-CM

## 2021-08-11 DIAGNOSIS — R627 Adult failure to thrive: Secondary | ICD-10-CM

## 2021-08-11 DIAGNOSIS — K5901 Slow transit constipation: Secondary | ICD-10-CM

## 2021-08-11 DIAGNOSIS — F339 Major depressive disorder, recurrent, unspecified: Secondary | ICD-10-CM

## 2021-08-11 DIAGNOSIS — F0280 Dementia in other diseases classified elsewhere without behavioral disturbance: Secondary | ICD-10-CM

## 2021-08-11 DIAGNOSIS — G309 Alzheimer's disease, unspecified: Secondary | ICD-10-CM

## 2021-08-11 NOTE — Progress Notes (Signed)
Location:   [facility] Room Number: 1 A Place of Service:  SNF (31) Provider:  Chipper Oman, NP  Mahlon Gammon, MD  Patient Care Team: Mahlon Gammon, MD as PCP - General (Internal Medicine) Nahser, Deloris Ping, MD as PCP - Cardiology (Cardiology) Karyss Frese X, NP as Nurse Practitioner (Internal Medicine)  Extended Emergency Contact Information Primary Emergency Contact: Morgan,Tom Address: [address]          [place], [place] [address] [place] Home Phone: [phone] Relation: Friend Secondary Emergency Contact: Brandy Hale [place] Home Phone: [phone] Relation: Niece  Code Status:  DNR Goals of care: Advanced Directive information Advanced Directives 08/11/2021  Does Patient Have a Medical Advance Directive? Yes  Type of Advance Directive Out of facility DNR (pink MOST or yellow form);Healthcare Power of Attorney  Does patient want to make changes to medical advance directive? No - Patient declined  Copy of Healthcare Power of Attorney in Chart? Yes - validated most recent copy scanned in chart (See row information)  Would patient like information on creating a medical advance directive? -  Pre-existing out of facility DNR order (yellow form or pink MOST form) Yellow form placed in chart (order not valid for inpatient use)     Chief Complaint  Patient presents with   Medical Management of Chronic Issues    Routine follow up visit.   Health Maintenance    Zoster vaccine, 3rd COVID booster.    HPI:  Pt is a 88 y.o. female seen today for medical management of chronic diseases.    Advanced dementia, no safety awareness, total care of ADLs             FTT, supportive care, under Hospice service for comfort measures.  Unresponsive, tachypnea, tachycardia. Prn Lorazepam available for restlessness.              Chronic lower back pain, takes Tylenol, Morphine             Constipation,  MOM available  to her.              Afib, heart rate is not in control, not on rate control agent or anticoagulation. Past Medical History:  Diagnosis Date   Anticoagulant long-term use    Atrial fibrillation (HCC)    CHF (congestive heart failure) (HCC)    Chronic anticoagulation    Hypertension    Raynaud phenomenon    Tremor, essential    Past Surgical History:  Procedure Laterality Date   CHOLECYSTECTOMY     TONSILLECTOMY      Allergies  Allergen Reactions   Fosamax [Alendronate]    Other Nausea And Vomiting    Green pepper    Allergies as of 08/11/2021       Reactions   Fosamax [alendronate]    Other Nausea And Vomiting   Green pepper        Medication List        Accurate as of August 11, 2021 11:59 PM. If you have any questions, ask your nurse or doctor.          acetaminophen 500 MG tablet Commonly known as: TYLENOL Take 500 mg by mouth every 8 (eight) hours as needed for mild pain or moderate pain.   acetaminophen 500 MG tablet Commonly known as: TYLENOL Take 1,000 mg by mouth 2 (two) times daily. Not to exceed 3,00 mg daily.   magnesium hydroxide 400 MG/5ML  suspension Commonly known as: MILK OF MAGNESIA Take by mouth every other day.   morphine 5 MG/ML injection Inject into the vein. 100 mg/5 mL (20 mg/mL); amt: 5 mg = 0.25 mL; oral Special Instructions: PRN pain/dyspnea/restlessness Every 4 Hours - PRN   sennosides-docusate sodium 8.6-50 MG tablet Commonly known as: SENOKOT-S Take 2 tablets by mouth at bedtime.        Review of Systems  Unable to perform ROS: Dementia   Immunization History  Administered Date(s) Administered   Influenza Whole 05/09/2018   Influenza, High Dose Seasonal PF 05/09/2019   Influenza-Unspecified 05/28/2017, 05/19/2020, 05/25/2021   Moderna SARS-COV2 Booster Vaccination 01/04/2021   Moderna Sars-Covid-2 Vaccination 08/09/2019, 09/06/2019, 06/15/2020   PFIZER(Purple Top)SARS-COV-2 Vaccination 04/26/2021   Pneumococcal  Conjugate-13 07/13/2017   Pneumococcal Polysaccharide-23 04/07/2002   Tdap 07/13/2017, 10/08/2017   Pertinent  Health Maintenance Due  Topic Date Due   INFLUENZA VACCINE  Completed   DEXA SCAN  Completed   Fall Risk 10/05/2017 04/20/2018 11/25/2018 03/10/2019  Falls in the past year? No - - (No Data)  Number of falls in past year - - - Emmi Telephone Survey Actual Response =   Patient Fall Risk Level - High fall risk High fall risk -   Functional Status Survey:    Vitals:   08/11/21 1156  BP: 120/68  Pulse: 90  Resp: 12  Temp: (!) 96.7 F (35.9 C)  SpO2: 92%  Weight: 96 lb 12.8 oz (43.9 kg)  Height: 5\' 4"  (1.626 m)   Body mass index is 16.62 kg/m. Physical Exam Vitals and nursing note reviewed.  Constitutional:      Comments: Unresponsive.   HENT:     Head: Normocephalic and atraumatic.     Mouth/Throat:     Mouth: Mucous membranes are dry.  Cardiovascular:     Rate and Rhythm: Tachycardia present. Rhythm irregular.  Pulmonary:     Breath sounds: No rales.     Comments: Tachypnea Abdominal:     Palpations: Abdomen is soft.  Musculoskeletal:     Cervical back: Normal range of motion and neck supple.     Right lower leg: No edema.     Left lower leg: No edema.  Skin:    General: Skin is warm and dry.  Neurological:     Comments: Unresponsive.     Labs reviewed: Recent Labs    10/28/20 0000  NA 135*  K 4.5  CL 101  CO2 27*  BUN 17  CREATININE 0.7  CALCIUM 8.7   Recent Labs    10/28/20 0000  AST 18  ALT 16  ALKPHOS 63  ALBUMIN 3.4*   Recent Labs    10/28/20 0000  WBC 5.4  HGB 11.6*  HCT 35*  PLT 233   Lab Results  Component Value Date   TSH 2.65 10/28/2020   No results found for: HGBA1C No results found for: CHOL, HDL, LDLCALC, LDLDIRECT, TRIG, CHOLHDL  Significant Diagnostic Results in last 30 days:  No results found.  Assessment/Plan Adult failure to thrive supportive care, under Hospice service for comfort measures.   Unresponsive, tachypnea, tachycardia. Prn Lorazepam available for restlessness.   Lower back pain  Chronic lower back pain,  Tylenol, Morphine available to her.   Slow transit constipation  MOM available to her.   Chronic a-fib (HCC) heart rate is not in control, has Morphine, Lorazepam prn for comfort measures.   Mixed Alzheimer's and vascular dementia (HCC) no safety awareness, total care of ADLs  Family/ staff Communication: plan of care reviewed with the patient, Hospice nurse, and charge nurse.   Labs/tests ordered:  none  Time spend 25 minutes.

## 2021-08-11 NOTE — Assessment & Plan Note (Deleted)
dependent of ADLs, off  Propranolol 

## 2021-08-11 NOTE — Assessment & Plan Note (Addendum)
supportive care, under Hospice service for comfort measures.  Unresponsive, tachypnea, tachycardia. Prn Lorazepam available for restlessness.

## 2021-08-11 NOTE — Assessment & Plan Note (Deleted)
able to be redirected with activities, 

## 2021-08-11 NOTE — Assessment & Plan Note (Addendum)
Chronic lower back pain,  Tylenol, Morphine available to her.

## 2021-08-11 NOTE — Assessment & Plan Note (Addendum)
heart rate is not in control, has Morphine, Lorazepam prn for comfort measures.

## 2021-08-11 NOTE — Assessment & Plan Note (Addendum)
MOM available to her.

## 2021-08-12 NOTE — Assessment & Plan Note (Signed)
no safety awareness, total care of ADLs 

## 2021-09-07 DEATH — deceased
